# Patient Record
Sex: Male | Born: 1975
Health system: Southern US, Community
[De-identification: ages and names within clinical notes are randomized; demographics above are authoritative.]

## PROBLEM LIST (undated history)

## (undated) DIAGNOSIS — I10 Essential (primary) hypertension: Secondary | ICD-10-CM

## (undated) DIAGNOSIS — F209 Schizophrenia, unspecified: Secondary | ICD-10-CM

## (undated) DIAGNOSIS — J449 Chronic obstructive pulmonary disease, unspecified: Secondary | ICD-10-CM

## (undated) DIAGNOSIS — F1410 Cocaine abuse, uncomplicated: Secondary | ICD-10-CM

## (undated) DIAGNOSIS — I2699 Other pulmonary embolism without acute cor pulmonale: Secondary | ICD-10-CM

## (undated) DIAGNOSIS — G8929 Other chronic pain: Secondary | ICD-10-CM

## (undated) DIAGNOSIS — M549 Dorsalgia, unspecified: Secondary | ICD-10-CM

## (undated) DIAGNOSIS — Z8659 Personal history of other mental and behavioral disorders: Secondary | ICD-10-CM

## (undated) HISTORY — DX: Other chronic pain: G89.29

## (undated) HISTORY — DX: Cocaine abuse, uncomplicated: F14.10

## (undated) HISTORY — DX: Personal history of other mental and behavioral disorders: Z86.59

## (undated) HISTORY — DX: Dorsalgia, unspecified: M54.9

---

## 1999-02-19 ENCOUNTER — Inpatient Hospital Stay (HOSPITAL_COMMUNITY): Admission: EM | Admit: 1999-02-19 | Discharge: 1999-02-22 | Payer: Self-pay | Admitting: Emergency Medicine

## 2002-09-19 ENCOUNTER — Inpatient Hospital Stay (HOSPITAL_COMMUNITY): Admission: EM | Admit: 2002-09-19 | Discharge: 2002-10-01 | Payer: Self-pay | Admitting: Psychiatry

## 2003-04-03 ENCOUNTER — Encounter: Payer: Self-pay | Admitting: *Deleted

## 2003-04-03 ENCOUNTER — Inpatient Hospital Stay (HOSPITAL_COMMUNITY): Admission: EM | Admit: 2003-04-03 | Discharge: 2003-04-08 | Payer: Self-pay | Admitting: Psychiatry

## 2003-06-18 ENCOUNTER — Emergency Department (HOSPITAL_COMMUNITY): Admission: EM | Admit: 2003-06-18 | Discharge: 2003-06-18 | Payer: Self-pay | Admitting: Emergency Medicine

## 2003-06-30 ENCOUNTER — Inpatient Hospital Stay (HOSPITAL_COMMUNITY): Admission: EM | Admit: 2003-06-30 | Discharge: 2003-07-04 | Payer: Self-pay | Admitting: Psychiatry

## 2003-07-01 ENCOUNTER — Encounter: Payer: Self-pay | Admitting: Psychiatry

## 2003-07-05 ENCOUNTER — Emergency Department (HOSPITAL_COMMUNITY): Admission: EM | Admit: 2003-07-05 | Discharge: 2003-07-05 | Payer: Self-pay | Admitting: Emergency Medicine

## 2003-12-02 ENCOUNTER — Inpatient Hospital Stay (HOSPITAL_COMMUNITY): Admission: EM | Admit: 2003-12-02 | Discharge: 2003-12-05 | Payer: Self-pay | Admitting: Psychiatry

## 2004-01-14 ENCOUNTER — Emergency Department (HOSPITAL_COMMUNITY): Admission: EM | Admit: 2004-01-14 | Discharge: 2004-01-15 | Payer: Self-pay | Admitting: Emergency Medicine

## 2004-01-14 ENCOUNTER — Emergency Department (HOSPITAL_COMMUNITY): Admission: EM | Admit: 2004-01-14 | Discharge: 2004-01-14 | Payer: Self-pay | Admitting: Emergency Medicine

## 2004-10-04 ENCOUNTER — Emergency Department (HOSPITAL_COMMUNITY): Admission: EM | Admit: 2004-10-04 | Discharge: 2004-10-04 | Payer: Self-pay | Admitting: Emergency Medicine

## 2005-04-18 ENCOUNTER — Ambulatory Visit: Payer: Self-pay | Admitting: Psychiatry

## 2005-04-18 ENCOUNTER — Inpatient Hospital Stay (HOSPITAL_COMMUNITY): Admission: AD | Admit: 2005-04-18 | Discharge: 2005-04-23 | Payer: Self-pay | Admitting: Psychiatry

## 2005-07-10 ENCOUNTER — Inpatient Hospital Stay (HOSPITAL_COMMUNITY): Admission: EM | Admit: 2005-07-10 | Discharge: 2005-07-14 | Payer: Self-pay | Admitting: Psychiatry

## 2005-07-10 ENCOUNTER — Ambulatory Visit: Payer: Self-pay | Admitting: Psychiatry

## 2006-01-07 ENCOUNTER — Emergency Department (HOSPITAL_COMMUNITY): Admission: EM | Admit: 2006-01-07 | Discharge: 2006-01-07 | Payer: Self-pay | Admitting: Family Medicine

## 2006-03-01 ENCOUNTER — Emergency Department (HOSPITAL_COMMUNITY): Admission: EM | Admit: 2006-03-01 | Discharge: 2006-03-01 | Payer: Self-pay | Admitting: Family Medicine

## 2006-03-04 ENCOUNTER — Emergency Department (HOSPITAL_COMMUNITY): Admission: EM | Admit: 2006-03-04 | Discharge: 2006-03-04 | Payer: Self-pay | Admitting: Emergency Medicine

## 2006-03-07 ENCOUNTER — Emergency Department (HOSPITAL_COMMUNITY): Admission: EM | Admit: 2006-03-07 | Discharge: 2006-03-07 | Payer: Self-pay | Admitting: Emergency Medicine

## 2006-06-07 ENCOUNTER — Ambulatory Visit: Payer: Self-pay | Admitting: Psychiatry

## 2006-06-07 ENCOUNTER — Inpatient Hospital Stay (HOSPITAL_COMMUNITY): Admission: AD | Admit: 2006-06-07 | Discharge: 2006-06-13 | Payer: Self-pay | Admitting: Psychiatry

## 2006-07-16 ENCOUNTER — Inpatient Hospital Stay (HOSPITAL_COMMUNITY): Admission: EM | Admit: 2006-07-16 | Discharge: 2006-07-25 | Payer: Self-pay | Admitting: *Deleted

## 2006-07-16 ENCOUNTER — Ambulatory Visit: Payer: Self-pay | Admitting: Psychiatry

## 2006-10-30 ENCOUNTER — Emergency Department (HOSPITAL_COMMUNITY): Admission: EM | Admit: 2006-10-30 | Discharge: 2006-10-31 | Payer: Self-pay | Admitting: Emergency Medicine

## 2007-09-16 ENCOUNTER — Encounter: Payer: Self-pay | Admitting: Emergency Medicine

## 2007-09-16 ENCOUNTER — Inpatient Hospital Stay (HOSPITAL_COMMUNITY): Admission: AD | Admit: 2007-09-16 | Discharge: 2007-09-25 | Payer: Self-pay | Admitting: Psychiatry

## 2007-09-18 ENCOUNTER — Ambulatory Visit: Payer: Self-pay | Admitting: Psychiatry

## 2007-10-03 ENCOUNTER — Encounter: Payer: Self-pay | Admitting: Emergency Medicine

## 2007-10-03 ENCOUNTER — Inpatient Hospital Stay (HOSPITAL_COMMUNITY): Admission: AD | Admit: 2007-10-03 | Discharge: 2007-10-10 | Payer: Self-pay | Admitting: Psychiatry

## 2007-10-11 ENCOUNTER — Emergency Department (HOSPITAL_COMMUNITY): Admission: EM | Admit: 2007-10-11 | Discharge: 2007-10-11 | Payer: Self-pay | Admitting: Emergency Medicine

## 2009-03-15 ENCOUNTER — Emergency Department (HOSPITAL_COMMUNITY): Admission: EM | Admit: 2009-03-15 | Discharge: 2009-03-15 | Payer: Self-pay | Admitting: Emergency Medicine

## 2009-04-09 ENCOUNTER — Emergency Department (HOSPITAL_COMMUNITY): Admission: EM | Admit: 2009-04-09 | Discharge: 2009-04-09 | Payer: Self-pay | Admitting: Emergency Medicine

## 2009-04-11 ENCOUNTER — Emergency Department (HOSPITAL_COMMUNITY): Admission: EM | Admit: 2009-04-11 | Discharge: 2009-04-12 | Payer: Self-pay | Admitting: Emergency Medicine

## 2009-04-16 ENCOUNTER — Inpatient Hospital Stay (HOSPITAL_COMMUNITY): Admission: EM | Admit: 2009-04-16 | Discharge: 2009-04-17 | Payer: Self-pay | Admitting: Emergency Medicine

## 2009-05-02 ENCOUNTER — Emergency Department (HOSPITAL_COMMUNITY): Admission: EM | Admit: 2009-05-02 | Discharge: 2009-05-05 | Payer: Self-pay | Admitting: Emergency Medicine

## 2009-05-04 ENCOUNTER — Ambulatory Visit: Payer: Self-pay | Admitting: *Deleted

## 2009-08-10 ENCOUNTER — Emergency Department (HOSPITAL_COMMUNITY): Admission: EM | Admit: 2009-08-10 | Discharge: 2009-08-10 | Payer: Self-pay | Admitting: Emergency Medicine

## 2009-10-22 ENCOUNTER — Ambulatory Visit: Payer: Self-pay | Admitting: Psychiatry

## 2009-10-22 ENCOUNTER — Other Ambulatory Visit: Payer: Self-pay

## 2009-10-22 ENCOUNTER — Other Ambulatory Visit: Payer: Self-pay | Admitting: Emergency Medicine

## 2009-10-22 ENCOUNTER — Inpatient Hospital Stay (HOSPITAL_COMMUNITY): Admission: AD | Admit: 2009-10-22 | Discharge: 2009-10-27 | Payer: Self-pay | Admitting: Psychiatry

## 2009-12-30 ENCOUNTER — Emergency Department (HOSPITAL_COMMUNITY): Admission: EM | Admit: 2009-12-30 | Discharge: 2009-12-30 | Payer: Self-pay | Admitting: Emergency Medicine

## 2010-01-11 ENCOUNTER — Other Ambulatory Visit: Payer: Self-pay | Admitting: Emergency Medicine

## 2010-01-11 ENCOUNTER — Other Ambulatory Visit: Payer: Self-pay

## 2010-01-12 ENCOUNTER — Ambulatory Visit: Payer: Self-pay | Admitting: Psychiatry

## 2010-01-13 ENCOUNTER — Inpatient Hospital Stay (HOSPITAL_COMMUNITY): Admission: AD | Admit: 2010-01-13 | Discharge: 2010-01-15 | Payer: Self-pay | Admitting: Psychiatry

## 2010-01-30 ENCOUNTER — Emergency Department (HOSPITAL_COMMUNITY): Admission: EM | Admit: 2010-01-30 | Discharge: 2010-01-31 | Payer: Self-pay | Admitting: Emergency Medicine

## 2010-02-15 ENCOUNTER — Emergency Department (HOSPITAL_COMMUNITY): Admission: EM | Admit: 2010-02-15 | Discharge: 2010-02-15 | Payer: Self-pay | Admitting: Emergency Medicine

## 2010-03-15 ENCOUNTER — Emergency Department (HOSPITAL_COMMUNITY): Admission: EM | Admit: 2010-03-15 | Discharge: 2010-03-17 | Payer: Self-pay | Admitting: Emergency Medicine

## 2010-03-17 ENCOUNTER — Ambulatory Visit: Payer: Self-pay | Admitting: Psychiatry

## 2010-06-12 ENCOUNTER — Other Ambulatory Visit: Payer: Self-pay | Admitting: Emergency Medicine

## 2010-06-12 ENCOUNTER — Inpatient Hospital Stay (HOSPITAL_COMMUNITY): Admission: EM | Admit: 2010-06-12 | Discharge: 2010-06-15 | Payer: Self-pay | Admitting: Psychiatry

## 2010-06-12 ENCOUNTER — Ambulatory Visit: Payer: Self-pay | Admitting: Psychiatry

## 2010-12-10 LAB — DIFFERENTIAL
Eosinophils Relative: 6 % — ABNORMAL HIGH (ref 0–5)
Lymphocytes Relative: 27 % (ref 12–46)
Lymphs Abs: 2.7 10*3/uL (ref 0.7–4.0)
Monocytes Absolute: 0.5 10*3/uL (ref 0.1–1.0)

## 2010-12-10 LAB — CBC
HCT: 41.9 % (ref 39.0–52.0)
MCV: 90.7 fL (ref 78.0–100.0)
Platelets: 203 10*3/uL (ref 150–400)
RBC: 4.62 MIL/uL (ref 4.22–5.81)
WBC: 9.8 10*3/uL (ref 4.0–10.5)

## 2010-12-10 LAB — RAPID URINE DRUG SCREEN, HOSP PERFORMED
Amphetamines: NOT DETECTED
Barbiturates: NOT DETECTED
Benzodiazepines: NOT DETECTED
Cocaine: POSITIVE — AB
Opiates: NOT DETECTED
Tetrahydrocannabinol: NOT DETECTED

## 2010-12-10 LAB — BASIC METABOLIC PANEL
Chloride: 102 mEq/L (ref 96–112)
GFR calc Af Amer: >60 mL/min (ref >60)
Potassium: 4.1 mEq/L (ref 3.5–5.1)

## 2010-12-10 LAB — ETHANOL: Alcohol, Ethyl (B): <5 mg/dL (ref 0–10)

## 2010-12-13 LAB — RAPID URINE DRUG SCREEN, HOSP PERFORMED
Amphetamines: NOT DETECTED
Barbiturates: NOT DETECTED
Barbiturates: NOT DETECTED
Benzodiazepines: NOT DETECTED
Cocaine: POSITIVE — AB
Opiates: NOT DETECTED
Tetrahydrocannabinol: NOT DETECTED

## 2010-12-13 LAB — URINALYSIS, ROUTINE W REFLEX MICROSCOPIC
Glucose, UA: NEGATIVE mg/dL
Hgb urine dipstick: NEGATIVE
Ketones, ur: NEGATIVE mg/dL
pH: 7.5 (ref 5.0–8.0)

## 2010-12-13 LAB — DIFFERENTIAL
Basophils Absolute: 0 10*3/uL (ref 0.0–0.1)
Basophils Relative: 1 % (ref 0–1)
Eosinophils Absolute: 0.3 10*3/uL (ref 0.0–0.7)
Eosinophils Absolute: 0.4 10*3/uL (ref 0.0–0.7)
Eosinophils Relative: 4 % (ref 0–5)
Lymphs Abs: 2.7 10*3/uL (ref 0.7–4.0)
Monocytes Relative: 6 % (ref 3–12)
Neutrophils Relative %: 53 % (ref 43–77)

## 2010-12-13 LAB — HEMOGLOBIN A1C: Hgb A1c MFr Bld: 5.2 % (ref 4.6–6.1)

## 2010-12-13 LAB — CBC
HCT: 45.8 % (ref 39.0–52.0)
MCV: 93.4 fL (ref 78.0–100.0)
MCV: 95.2 fL (ref 78.0–100.0)
Platelets: 236 10*3/uL (ref 150–400)
RBC: 4.57 MIL/uL (ref 4.22–5.81)
RDW: 13.7 % (ref 11.5–15.5)
WBC: 7.8 10*3/uL (ref 4.0–10.5)

## 2010-12-13 LAB — POCT I-STAT, CHEM 8
Chloride: 102 mEq/L (ref 96–112)
Creatinine, Ser: 0.8 mg/dL (ref 0.4–1.5)
Creatinine, Ser: 1.1 mg/dL (ref 0.4–1.5)
Glucose, Bld: 86 mg/dL (ref 70–99)
HCT: 48 % (ref 39.0–52.0)
Hemoglobin: 16.3 g/dL (ref 13.0–17.0)
Hemoglobin: 15 g/dL (ref 13.0–17.0)
Potassium: 3.5 mEq/L (ref 3.5–5.1)
Sodium: 140 mEq/L (ref 135–145)
TCO2: 28 mmol/L (ref 0–100)

## 2010-12-13 LAB — HEPATIC FUNCTION PANEL
Albumin: 3.7 g/dL (ref 3.5–5.2)
Bilirubin, Direct: 0.1 mg/dL (ref 0.0–0.3)
Indirect Bilirubin: 0.8 mg/dL (ref 0.3–0.9)
Total Bilirubin: 0.9 mg/dL (ref 0.3–1.2)

## 2010-12-13 LAB — LIPID PANEL
Cholesterol: 175 mg/dL (ref 0–200)
HDL: 33 mg/dL — ABNORMAL LOW (ref >39)
Total CHOL/HDL Ratio: 5.3 RATIO

## 2010-12-13 LAB — HEPATITIS PANEL, ACUTE
Hep B C IgM: NEGATIVE
Hepatitis B Surface Ag: NEGATIVE

## 2010-12-15 LAB — DIFFERENTIAL
Basophils Absolute: 0 10*3/uL (ref 0.0–0.1)
Basophils Relative: 1 % (ref 0–1)
Eosinophils Absolute: 0.3 10*3/uL (ref 0.0–0.7)
Eosinophils Relative: 6 % — ABNORMAL HIGH (ref 0–5)
Eosinophils Relative: 5 % (ref 0–5)
Lymphs Abs: 2.7 10*3/uL (ref 0.7–4.0)
Monocytes Absolute: 0.4 10*3/uL (ref 0.1–1.0)
Monocytes Relative: 8 % (ref 3–12)

## 2010-12-15 LAB — BASIC METABOLIC PANEL
CO2: 30 mEq/L (ref 19–32)
Calcium: 9.2 mg/dL (ref 8.4–10.5)
Creatinine, Ser: 0.86 mg/dL (ref 0.4–1.5)
GFR calc non Af Amer: >60 mL/min (ref >60)
Glucose, Bld: 100 mg/dL — ABNORMAL HIGH (ref 70–99)
Glucose, Bld: 84 mg/dL (ref 70–99)
Potassium: 3.5 mEq/L (ref 3.5–5.1)
Sodium: 140 mEq/L (ref 135–145)
Sodium: 140 mEq/L (ref 135–145)

## 2010-12-15 LAB — RAPID URINE DRUG SCREEN, HOSP PERFORMED
Amphetamines: NOT DETECTED
Amphetamines: NOT DETECTED
Benzodiazepines: NOT DETECTED
Cocaine: POSITIVE — AB
Tetrahydrocannabinol: NOT DETECTED
Tetrahydrocannabinol: NOT DETECTED

## 2010-12-15 LAB — URINALYSIS, ROUTINE W REFLEX MICROSCOPIC
Bilirubin Urine: NEGATIVE
Ketones, ur: NEGATIVE mg/dL
Nitrite: NEGATIVE
Urobilinogen, UA: 0.2 mg/dL (ref 0.0–1.0)

## 2010-12-15 LAB — CBC
HCT: 42.2 % (ref 39.0–52.0)
Hemoglobin: 14.5 g/dL (ref 13.0–17.0)
Hemoglobin: 15.3 g/dL (ref 13.0–17.0)
MCHC: 34.4 g/dL (ref 30.0–36.0)
MCV: 94.2 fL (ref 78.0–100.0)
Platelets: 203 10*3/uL (ref 150–400)
RDW: 13.3 % (ref 11.5–15.5)
RDW: 13.6 % (ref 11.5–15.5)

## 2010-12-15 LAB — TRICYCLICS SCREEN, URINE: TCA Scrn: NOT DETECTED

## 2010-12-16 LAB — POCT I-STAT, CHEM 8
HCT: 43 % (ref 39.0–52.0)
Hemoglobin: 14.6 g/dL (ref 13.0–17.0)
Sodium: 140 mEq/L (ref 135–145)
TCO2: 28 mmol/L (ref 0–100)

## 2010-12-16 LAB — ETHANOL: Alcohol, Ethyl (B): <5 mg/dL (ref 0–10)

## 2011-01-02 LAB — CBC
HCT: 41.9 % (ref 39.0–52.0)
Hemoglobin: 14.3 g/dL (ref 13.0–17.0)
RBC: 4.48 MIL/uL (ref 4.22–5.81)
RDW: 13 % (ref 11.5–15.5)
WBC: 6 10*3/uL (ref 4.0–10.5)

## 2011-01-02 LAB — DIFFERENTIAL
Basophils Absolute: 0 10*3/uL (ref 0.0–0.1)
Basophils Relative: 1 % (ref 0–1)
Eosinophils Relative: 4 % (ref 0–5)
Lymphocytes Relative: 40 % (ref 12–46)
Lymphs Abs: 2.4 10*3/uL (ref 0.7–4.0)
Monocytes Absolute: 0.4 10*3/uL (ref 0.1–1.0)
Monocytes Relative: 7 % (ref 3–12)
Neutro Abs: 2.9 10*3/uL (ref 1.7–7.7)

## 2011-01-02 LAB — POCT I-STAT, CHEM 8
HCT: 43 % (ref 39.0–52.0)
Hemoglobin: 14.6 g/dL (ref 13.0–17.0)
Potassium: 3.8 mEq/L (ref 3.5–5.1)
Sodium: 142 mEq/L (ref 135–145)

## 2011-01-02 LAB — RAPID URINE DRUG SCREEN, HOSP PERFORMED
Barbiturates: NOT DETECTED
Opiates: NOT DETECTED
Tetrahydrocannabinol: NOT DETECTED

## 2011-01-03 LAB — CULTURE, ROUTINE-ABSCESS: Gram Stain: NONE SEEN

## 2011-01-03 LAB — DIFFERENTIAL
Basophils Absolute: 0 10*3/uL (ref 0.0–0.1)
Lymphocytes Relative: 16 % (ref 12–46)
Lymphs Abs: 1.5 10*3/uL (ref 0.7–4.0)
Monocytes Absolute: 0.3 10*3/uL (ref 0.1–1.0)
Monocytes Relative: 3 % (ref 3–12)
Neutro Abs: 6.8 10*3/uL (ref 1.7–7.7)

## 2011-01-03 LAB — URINE DRUGS OF ABUSE SCREEN W ALC, ROUTINE (REF LAB)
Barbiturate Quant, Ur: NEGATIVE
Benzodiazepines.: NEGATIVE
Methadone: NEGATIVE
Phencyclidine (PCP): NEGATIVE

## 2011-01-03 LAB — LIPID PANEL
Cholesterol: 125 mg/dL (ref 0–200)
HDL: 24 mg/dL — ABNORMAL LOW (ref >39)
LDL Cholesterol: 75 mg/dL (ref 0–99)
Total CHOL/HDL Ratio: 5.2 RATIO

## 2011-01-03 LAB — CBC
Hemoglobin: 14.2 g/dL (ref 13.0–17.0)
RBC: 4.43 MIL/uL (ref 4.22–5.81)
RDW: 12.6 % (ref 11.5–15.5)
WBC: 9.1 10*3/uL (ref 4.0–10.5)

## 2011-01-03 LAB — CULTURE, BLOOD (ROUTINE X 2)

## 2011-01-03 LAB — POCT I-STAT, CHEM 8
BUN: 8 mg/dL (ref 6–23)
Calcium, Ion: 1.08 mmol/L — ABNORMAL LOW (ref 1.12–1.32)
Chloride: 102 mEq/L (ref 96–112)
Sodium: 137 mEq/L (ref 135–145)

## 2011-01-03 LAB — APTT: aPTT: 28 seconds (ref 24–37)

## 2011-01-22 ENCOUNTER — Emergency Department (HOSPITAL_COMMUNITY)
Admission: EM | Admit: 2011-01-22 | Discharge: 2011-01-23 | Disposition: A | Discharge status: Home / Self Care | Payer: Medicare Other | Attending: Emergency Medicine | Admitting: Emergency Medicine

## 2011-01-22 DIAGNOSIS — F200 Paranoid schizophrenia: Secondary | ICD-10-CM

## 2011-01-22 DIAGNOSIS — X500XXA Overexertion from strenuous movement or load, initial encounter: Secondary | ICD-10-CM | POA: Insufficient documentation

## 2011-01-22 DIAGNOSIS — F209 Schizophrenia, unspecified: Secondary | ICD-10-CM | POA: Insufficient documentation

## 2011-01-22 DIAGNOSIS — S93409A Sprain of unspecified ligament of unspecified ankle, initial encounter: Secondary | ICD-10-CM | POA: Insufficient documentation

## 2011-01-22 DIAGNOSIS — IMO0002 Reserved for concepts with insufficient information to code with codable children: Secondary | ICD-10-CM | POA: Insufficient documentation

## 2011-01-22 DIAGNOSIS — Z046 Encounter for general psychiatric examination, requested by authority: Secondary | ICD-10-CM | POA: Insufficient documentation

## 2011-01-22 LAB — URINALYSIS, ROUTINE W REFLEX MICROSCOPIC
Bilirubin Urine: NEGATIVE
Hgb urine dipstick: NEGATIVE
Ketones, ur: NEGATIVE mg/dL
Nitrite: NEGATIVE
pH: 5.5 (ref 5.0–8.0)

## 2011-01-22 LAB — ETHANOL: Alcohol, Ethyl (B): <5 mg/dL (ref 0–10)

## 2011-01-22 LAB — DIFFERENTIAL
Eosinophils Absolute: 0.4 10*3/uL (ref 0.0–0.7)
Eosinophils Relative: 5 % (ref 0–5)
Lymphs Abs: 2.7 10*3/uL (ref 0.7–4.0)
Monocytes Relative: 4 % (ref 3–12)

## 2011-01-22 LAB — RAPID URINE DRUG SCREEN, HOSP PERFORMED
Amphetamines: NOT DETECTED
Benzodiazepines: NOT DETECTED
Cocaine: POSITIVE — AB
Opiates: NOT DETECTED
Tetrahydrocannabinol: NOT DETECTED

## 2011-01-22 LAB — CBC
HCT: 45.7 % (ref 39.0–52.0)
MCH: 31.6 pg (ref 26.0–34.0)
MCV: 90.3 fL (ref 78.0–100.0)
Platelets: 197 10*3/uL (ref 150–400)
RBC: 5.06 MIL/uL (ref 4.22–5.81)
RDW: 13.3 % (ref 11.5–15.5)

## 2011-01-22 LAB — BASIC METABOLIC PANEL
BUN: 6 mg/dL (ref 6–23)
Chloride: 106 mEq/L (ref 96–112)
Creatinine, Ser: 0.96 mg/dL (ref 0.4–1.5)
Glucose, Bld: 173 mg/dL — ABNORMAL HIGH (ref 70–99)

## 2011-01-23 DIAGNOSIS — F200 Paranoid schizophrenia: Secondary | ICD-10-CM
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
## 2011-02-09 NOTE — Discharge Summary (Signed)
[name], [name]                 ACCOUNT NO.:  [id number]   MEDICAL RECORD NO.:  [id number]          PATIENT TYPE:  INP   LOCATION:  5126                         [facility]ACILITY[facility]  [facility]   PHYSICIAN:  Charlestine Massed, MDDATE OF BIRTH:  June 14, 1976   DATE OF ADMISSION:  04/15/2009  DATE OF DISCHARGE:  04/17/2009                               DISCHARGE SUMMARY   PRIMARY CARE PHYSICIAN:  MD at [facility].   HAND SURGEON:  Dr. Izora Ribas.   REASON FOR ADMISSION:  Hand pain with infection.   DISCHARGE DIAGNOSES:  1. Right hand abscess, status post incision and drainage - growing      Staphylococcus aureus.  2. History of schizoaffective disorder, currently stable.   DISCHARGE MEDICATIONS:  1. Augmentin 875 mg p.o. b.i.d. times 7 days.  2. Bactrim DS one tablet p.o. b.i.d. for 7 days.   HOSPITAL COURSE:  #1 - Right upper extremity abscess:  [name]  is a 35 year old gentleman with a history of swelling and redness of the  dorsum of the right hand for four days.  The patient does not remember  any insect bite or any scratch or any injury.  The patient was seen in  the emergency room and the hand  surgeon was called.  The patient  underwent an I&D and the abscess fluid was sent for culture. T his  grew  Staphylococcus aureus.  The full identification is still pending.   The patient was started on vancomycin at the time of admission.  He did  not have any further pain or fever.   He was seen in transition today and was advised to okay the discharge  home, with home care, with four -times-a-day dressing and to follow up  with him in two weeks.  The patient's has antibiotic has been changed to  Bactrim and Augmentin, and the patient is being discharged.   #2 - 2.  Schizoaffective disorder:  Currently stable.  Follow up with  psychiatry at [facility].  Continue Zyprexa.   DISPOSITION:  1. Discharged to assisted living facility at [facility].  To follow up      for follow-up care as  required.  2. To follow up with Dr. Kristine Linea in two weeks.   DISCHARGE INSTRUCTIONS:  Dressing change four times daily as prescribed  with wet to dry packing four times daily, and if  needed, as per surgery.   A total of 40 minutes spent on this discharge.      Charlestine Massed, MD  Electronically Signed     UT/MEDQ  D:  04/17/2009  T:  04/17/2009  Job:  [id number]   cc:   Johnette Abraham, MD

## 2011-02-09 NOTE — H&P (Signed)
[name], [name]                 ACCOUNT NO.:  [id number]   MEDICAL RECORD NO.:  [id number]          PATIENT TYPE:  INP   LOCATION:  5126                         FACILITY:  [facility]   PHYSICIAN:  Johnette Abraham, MD    DATE OF BIRTH:  10-19-1975   DATE OF ADMISSION:  04/15/2009  DATE OF DISCHARGE:                              HISTORY & PHYSICAL   CHIEF COMPLAINT:  My hand hurts and is infected.   HISTORY OF PRESENT ILLNESS:  A 35 year old male with approximately 4 day  history of redness, swelling and drainage of the dorsum of the right  hand.  He does not recall any specific injury to the extremity or bitten  by an insect or other creature.  The patient apparently called 911 from  his assisted living and presented to the emergency room yesterday or  early this morning with signs and symptoms of infection and abscess of  the right hand.  The patient denies fever chest pain, swelling, and  drainage over the dorsum of the right hand.  His pain is sharp in  nature, it is rated at 4/5, it radiates to the dorsum of his hand,  keeping the hand still makes him feel better, obviously pressure on it,  makes it feel worse.  He also has an abrasion on the right elbow.   PAST MEDICAL HISTORY:  Significant for schizo-affective disorder.   ALLERGIES:  None.   MEDICATIONS:  Reviewed.  He is on a antipsychotic medications.   SOCIAL HISTORY:  He has a history of substance abuse.  He is a current  smoker.  He lives in assisted living.   FAMILY HISTORY:  Noncontributory.   REVIEW OF SYSTEMS:  HEENT, endocrine, neuro, psych, cardiovascular,  respiratory, heme, musculoskeletal, GI, and GU are all reviewed, these  are essentially normal with the exception of the psychiatric history and  the current infection.  The patient also states that he has a tender  spot on his lower extremity as well as his nose.   PHYSICAL EXAMINATION:  GENERAL:  He is afebrile.  He is sitting up in  bed.  He is alert, in  no acute distress.  CARDIOVASCULAR:  He has regular rate and rhythm.  RESPIRATORY:  He is in no acute distress.  ABDOMEN:  Soft and nontender.  EXTREMITIES:  Examination of the right upper extremity reveals normal  range of motion of the shoulder, elbow and wrist.  He has a obvious  cellulitis overlying the second and third metacarpal phalangeal joint  area.  There is scab overlying what appeared to be an open wound with  some purulence.  He has some tenderness more proximally overlying the  extensor tendon.  There is moderate swelling about the entire hand.  Distally, he is neurovascularly intact.  He is able to make a full fist,  however, with some discomfort.  Examination of the left upper extremity  has a normal range of motion about the shoulder, elbow, wrist and  fingers, this exam is essentially within normal limits.   LABORATORY DATA:  His white count is 9.  ASSESSMENT:  Abscess dorsum of the right hand early extensive  tenosynovitis.   PLAN:  I&D of the right hand.  He will be continued on vancomycin until  cultures have returned.      Johnette Abraham, MD  Electronically Signed     HCC/MEDQ  D:  04/16/2009  T:  04/17/2009  Job:  [id number]
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
## 2011-02-09 NOTE — H&P (Signed)
[name], [name]                 ACCOUNT NO.:  [id number]   MEDICAL RECORD NO.:  [id number]          PATIENT TYPE:  INP   LOCATION:  5120                         FACILITY:  [facility]   PHYSICIAN:  Lucile Crater, MD         DATE OF BIRTH:  [birth year]   DATE OF ADMISSION:  04/15/2009  DATE OF DISCHARGE:                              HISTORY & PHYSICAL   PRIMARY CARE PHYSICIAN:  None.   CHIEF COMPLAINT:  Right hand pain into 4 days.   HISTORY OF PRESENT ILLNESS:  [name] is a 35 year old Caucasian  gentleman with a history of schizoaffective disorder.  He currently  lives at an assisted living facility.  Earlier this evening he called 9-  1-1 because he has been having pain in his right hand for 4 days and the  assisted living facility refused to call 9-1-1.  The patient was then  brought to the emergency room.  On exam he has right hand swelling in  between the knuckles and there is fluctuance with a yellowish, purulent  discharge.  The patient denies having any fever or chills and he denies  to have been in a fight.  The patient also was noticed to have an  abrasion under his right elbow but he denies, he had no trauma or fight  absolutely.  The hand surgeon was contacted by the ER physician and he  agreed to do a consult on the patient, and he will be followed up  tomorrow.  The patient was given vancomycin 1 dose in the emergency room  and he continues to be afebrile.   REVIEW OF SYSTEMS:  A complete review of systems was done including  general, head, eyes, ears, nose, throat, cardiovascular, respiratory,  GI, GU, endocrine, musculoskeletal, skin, neurologic, psychiatric.  All  are within normal limits other than what is mentioned above.   PAST MEDICAL HISTORY:  1. Schizoaffective disorder.  2. Polysubstance abuse.  3. Cocaine abuse.  4. Generalized anxiety disorder.   ALLERGIES:  None.   MEDICATIONS AT HOME:  1. Zocor 5 mg p.o. q.h.s.  2. Zyprexa 20 mg p.o. q.h.s.  3.  Ibuprofen 800 mg p.o. t.i.d. p.r.n.   SOCIAL HISTORY:  He currently lives at an assisted living facility.  Tobacco abuse is ongoing.  He denies any recent cocaine abuse.  He  denies any IV drug abuse.   FAMILY HISTORY:  Noncontributory.   PHYSICAL EXAMINATION:  T-max 97.6, pulse rate 97, respirations 22, blood  pressure 129/76, O2 saturation 96%.  GENERAL APPEARANCE:  Not in any acute distress.  Alert, awake, oriented  x3.  Afebrile.  HEENT:  Normocephalic, atraumatic. The pupils are equal and react to  light and accommodation.  Extraocular movements are intact.  The mucous  membranes are moist.  NECK:  Supple.  No JVD, lymphadenopathy or carotid bruit.  CVS:  Regular rhythm.  Rate is normal.  No murmurs, rubs or gallops.  LUNGS:  Clear to auscultation bilaterally.  EXTREMITIES:  No clubbing, cyanosis, or edema.  The right hand is  swollen with fluctuance of  2 x 2 cm.  There is a 2 x 3-inch abrasion  under the right elbow.  NEUROLOGIC EXAM:  Grossly nonfocal.   LABORATORY DATA AND STUDIES:  CBC with differential:  WBC 9100,  hemoglobin 14.2, hematocrit 41, normal differential.  INR 1, PT 13.1,  APTT 28.  Sodium 137, potassium 3.6, chloride 102, BUN 8, creatinine  0.9, blood glucose 79.   ASSESSMENT AND PLAN:  1. Right hand cellulitis/abscess.  The patient will be started on IV      antibiotics.  He has a history of methicillin-resistant      Staphylococcus aureus in the past.  We will start him on      vancomycin.  Awaiting the hand surgeon consult.  He might require      incision and drainage.  2. Schizoaffective disorder.  We will continue his Zyprexa.  3. Hyperlipidemia.  We will continue his simvastatin and check a      fasting lipid panel.  4. Polysubstance abuse.  We will get a urine drug screen.  5. Deep vein thrombosis prophylaxis with subcutaneous unfractionated      heparin.  6. Fluids, electrolytes, and nutrition.  We will place a saline well.      We will replace  electrolytes as needed.  He will be started on a      regular diet.   The patient will be admitted to the floor for antibiotics and a possible  incision and drainage of the right hand abscess in the morning.      Lucile Crater, MD  Electronically Signed     TA/MEDQ  D:  04/16/2009  T:  04/16/2009  Job:  [id number]

## 2011-02-09 NOTE — Op Note (Signed)
[name], PASION                 ACCOUNT NO.:  [id number]   MEDICAL RECORD NO.:  [id number]          PATIENT TYPE:  INP   LOCATION:  5120                         FACILITY:  [facility]   PHYSICIAN:  Johnette Abraham, MD    DATE OF BIRTH:  September 12, 1976   DATE OF PROCEDURE:  04/16/2009  DATE OF DISCHARGE:                               OPERATIVE REPORT   PREOPERATIVE DIAGNOSIS:  Abscess and early tenosynovitis of the right  hand.   POSTOPERATIVE DIAGNOSIS:  Abscess and early tenosynovitis of the right  hand.   PROCEDURE:  Incision and drainage of the abscess of the right hand,  irrigation of the extensor tendon sheath, packing with iodoform gauze.   ANESTHESIA:  Local 1% lidocaine without epinephrine.   No acute complications.   SPECIMENS:  Cultures were sent.   INDICATIONS:  [name] is a 35 year old male with 4-day history of  swelling and drainage overlying the MCP joint of the second and third  digits.  It is believed that this is an abscess and early tenosynovitis.  Risks, benefits, and alternatives of surgery were discussed with the  patient, he agreed to proceed.  Consent was obtained.  A time-out was  performed.   PROCEDURE:  The hand was prepped and draped in normal sterile fashion.  The dorsum of the hand was anesthetized with 1% lidocaine giving good  anesthesia.  There was a scab overlying the actual abscess.  This was de-  roofed.  Full-thickness skin was debrided surrounding this.  Cultures  were obtained.  There was quite a bit of fat necrosis.  There was a  pocket that was a little bit more proximal.  This was undermined and  opened.  The abscess pocket was irrigated with irrigation solution and  actually some hydrogen peroxide.  Extensor tendon sheath was then gently  opened and irrigated as well.  Palpation along the extensor tendon  sheath both proximally and distally and around the wound did not reveal  any loculated pockets of purulence.  The wound was then  packed with 0.25-  inch iodoform gauze and covered with sterile dressings.      Johnette Abraham, MD  Electronically Signed     HCC/MEDQ  D:  04/16/2009  T:  04/16/2009  Job:  [id number]
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
## 2011-02-12 NOTE — Consult Note (Signed)
   [name], [name]                           ACCOUNT NO.:  [id number]   MEDICAL RECORD NO.:  [id number]                   PATIENT TYPE:  IPS   LOCATION:  0306                                 [facility]ILITY[facility]  [facility]   PHYSICIAN:  Karlene Einstein, M.D.             DATE OF BIRTH:  [birth year]   DATE OF CONSULTATION:  07/03/2003  DATE OF DISCHARGE:                                   CONSULTATION   CONSULTING PHYSICIAN:  Karlene Einstein, M.D.   REQUESTING PHYSICIAN:  Jeanice Lim, M.D.   REASON FOR CONSULTATION:  Hypertriglyceridemia.   HISTORY OF PRESENT ILLNESS:  A 35 year old white male admitted at [facility] with a diagnosis of schizophrenia and cocaine abuse found  to be having elevated triglyceride levels.  The patient denies any family  history of hypertriglyceridemia or hyperlipidemia.  He denies any problems.   REVIEW OF SYSTEMS:  CARDIOVASCULAR:  No chest pain, palpitations, orthopnea,  or PND.  RESPIRATORY:  No cough or shortness of breath.   PHYSICAL EXAMINATION:  VITAL SIGNS:  Blood pressure 122/69.  GENERAL:  No acute distress, cooperative.  LUNGS:  Clear to auscultation bilaterally.  No rales or rhonchi.  HEART:  Regular rate and rhythm, normal S1 and S2.  ABDOMEN:  Soft, nontender, nondistended.  No organomegaly.   LABORATORY DATA:  Total cholesterol 176, triglycerides 498, HDL 27.   ASSESSMENT AND PLAN:  Hypertriglyceridemia.  His total cholesterol is  normal.  He is relatively young but he is schizophrenic.  We will offer diet  counseling.  Doubt he will be adherent to diet.  He says he almost eats  pizza every day.  Will start Tricor 54 mg daily.  LFTs are within normal  limits.  He will need a follow-up of fasting lipid panel and LFTs in three  months.   Thank you for this consultation.  Will sign off today.                                               Karlene Einstein, M.D.    GD/MEDQ  D:  07/03/2003  T:  07/03/2003  Job:   [id number]

## 2011-02-12 NOTE — Discharge Summary (Signed)
[name], [name] NO.:  [id number]   MEDICAL RECORD NO.:  [id number]          PATIENT TYPE:  IPS   LOCATION:  0508                          [facility]ACILITY[facility]  [facility]   PHYSICIAN:  Geoffery Lyons, M.D.      DATE OF BIRTH:  03/04/1976   DATE OF ADMISSION:  07/16/2006  DATE OF DISCHARGE:  07/25/2006                               DISCHARGE SUMMARY   CHIEF COMPLAINT AND PRESENT ILLNESS:  This was one of several admissions  to [facility] for this 35 year old single white male.  Last admitted June 07, 2006 to June 13, 2006.  Being followed  up.  Has not been compliant with his Zyprexa.  Presented to [facility].  Reported that he was homeless.  Uncle left him.  No place to  stay.  As he was not suicidal, he was told to come back during normal  business hours.  He reported history of walking in the streets in an  attempt to prostitute himself to get money.  Ran into someone from  [employer] who brought him back to mental health.   PAST PSYCHIATRIC HISTORY:  Diagnosed schizophrenic with poor medication  compliance.  Follow-up at the [facility] with abuse of cocaine and  marijuana.   MEDICAL HISTORY:  Noncontributory.   ALCOHOL/DRUG HISTORY:  As already stated, relapsed on cocaine and  marijuana.   MEDICATIONS:  Zyprexa 20 mg, Symmetrel 100 mg twice a day but  noncompliant.   PHYSICAL EXAMINATION:  Performed and failed to show any acute findings.   LABORATORY DATA:  CBC with white blood cells 7.7, hemoglobin 15.3.  Blood chemistry with sodium 139, potassium 3.8, glucose 95.  Liver  enzymes with SGOT 14, SGPT 11, total bilirubin 0.8.   MENTAL STATUS EXAM:  Alert male.  Not as spontaneous.  Very guarded,  somewhat resistant.  Pretty limited insight.  Pretty concrete.  No  active delusions.  No acute suicidal or homicidal ideation.  No  hallucinations.  Cognition was preserved.   ADMISSION DIAGNOSES:  AXIS I:  Schizophrenia,  paranoid.  Cocaine and  marijuana abuse.  AXIS II:  No diagnosis.  AXIS III:  Multiple dental problems.  AXIS IV:  Moderate.  AXIS V:  GAF upon admission 35; highest GAF in the last year 60.   HOSPITAL COURSE:  He was admitted.  He was started in individual and  group psychotherapy.  He was placed back on Zyprexa.  He was given the  Symmetrel.  Initially very sedated, not too communicative.  By the  second day, more alert.  Endorsed he needed a place to go.  Would not  want to stay in [place].  Said that the payee did not keep up with  his part of the deal with another guy and left him and they owe money.  The payee fled from the area.  He is afraid of these people.  He  endorsed that he was noncompliant with medication.  He went back using  cocaine.  Very upset because the payee left him and he endorsed no other  support.  He wanted to go to [place].  He tried to get in touch with  payee unsuccessfully.  Meanwhile, we got the Zyprexa back in place.  We  worked on relapse prevention plan.  Continued to be focused.  He was  afraid to stay in [place].  He went through an interview for a group  home but he was turned down.  He got upset but he was able to deal with  it.  The voices were better.  He was sleeping through the night.  Endorsed that he was not tired anymore.  For the next several days, we  continued to stabilize.  By July 25, 2006, he was in full contact  with reality.  His mood was euthymic.  His affect was broad, interacting  appropriately.  He was going to be placed in a group home.  He was  planning to abstain from cocaine and follow up closer with mental  health.  As he was much improved, we went ahead and discharged to  outpatient follow-up.   DISCHARGE DIAGNOSES:  AXIS I:  Schizophrenia, paranoid.  Cocaine abuse.  AXIS II:  No diagnosis.  AXIS III:  No diagnosis.  AXIS IV:  Moderate.  AXIS V:  GAF upon discharge 50.   DISCHARGE MEDICATIONS:  1. Zyprexa  Zydis 20 mg at bedtime.  2. Orajel apply to painful gums four times a day.  3. Ibuprofen 400 mg three times a day.  4. Amantadine 100 mg twice a day.   FOLLOWUP:  Dr. Lolly Mustache in [place].      Geoffery Lyons, M.D.  Electronically Signed     IL/MEDQ  D:  08/22/2006  T:  08/22/2006  Job:  [id number]

## 2011-02-12 NOTE — H&P (Signed)
[name], [name]                           ACCOUNT NO.:  [id number]   MEDICAL RECORD NO.:  [id number]                   PATIENT TYPE:  IPS   LOCATION:  0405                                 FACILITY:  [facility]   PHYSICIAN:  Geoffery Lyons, M.D.                   DATE OF BIRTH:  Aug 31, 1976   DATE OF ADMISSION:  12/02/2003  DATE OF DISCHARGE:  12/05/2003                         PSYCHIATRIC ADMISSION ASSESSMENT   IDENTIFYING INFORMATION:  This is a 35 year old white male who is single.  This is a voluntary admission.   HISTORY OF PRESENT ILLNESS:  This patient presented himself at the hospital  with a guarded affect and appeared confused.  He was sent to the emergency  room for medical clearance and diagnosed at that time with an upper  respiratory infection.  His urine drug screen was positive for cocaine.  Today the patient appears perplexed and distracted.  He is listening to  voices and unable to attend for an adequate interview.  He is unable to give  history.  History is primarily taken from the record.  He has admitted to  feeling suicidal without any clear plan.   PAST PSYCHIATRIC HISTORY:  This is the patient's fourth admission to [facility] since December of 2003 with his last admission  being in October of 2004.  He has previously been seen at [facility] but his compliance is not clear.  He has a history of  cocaine abuse, psychosis not otherwise specified, possibly schizophrenia,  auditory hallucinations and suicidal ideation and has in the past had  thoughts of stabbing himself.  He was previously treated with Zyprexa 30 mg  at h.s. in the past.   SOCIAL HISTORY:  This is a single white male with no children, never  married, previously had lived with his parents and his current living  situation is not clear.  Legal problems are not clear.   FAMILY HISTORY:  Not available.   ALCOHOL AND DRUG HISTORY:  The patient has a  history of cocaine abuse.   PAST MEDICAL HISTORY:  The patient has no regular primary care Sherika Kubicki.  Medical problems include an upper respiratory infection and nasal congestion  diagnosed in the emergency room.   MEDICATIONS:  The patient says he is taking none at this time.   PHYSICAL FINDINGS:  This is a well-nourished, well-developed, generally  healthy appearing young man who is disheveled.  Clothes are disrupted and  his hygiene is poor.  Vital signs:  Temperature 98.6, pulse 117,  respirations 22, blood pressure 144/97.  He does permit me to listen to his  heart lungs.  HEART:  S1 and S2 with regular rate and rhythm, tachycardic.  LUNGS:  Clear to auscultation.  The patient is 5 feet 11.5 inches tall, weighs 194 pounds.  His full physical was done in the emergency room and  is noted in the record.  His motor exam appears grossly normal.  He is unable to attend for cranial  nerve exam.  Motor movements are smooth, without any signs of tremor, no  diaphoresis.  Gait is grossly normal.  There is motor and facial symmetry  present.   DIAGNOSTIC STUDIES:  CBC was remarkable for mild elevation of white count of  11,400, with mildly elevated absolute neutrophils of 8,100.  Electrolytes  revealed a potassium of 3.4.  Other electrolytes are normal.  BUN 6,  creatinine 1.0.  Urine drug screen was positive for benzodiazepines.  Alcohol level was negative.  TSH, hepatic function, lipid profile and  glycohemoglobin  are currently pending.   DRUG ALLERGIES:  GEODON which caused EPS during his last admission and  ZYPREXA which was discontinued because of elevated triglycerides.   MENTAL STATUS EXAM:  This is a fully alert male who appears distracted and  appears to be listening to something.  He is directable without too much  difficulty, attempts to be cooperative but is unable to attend to  conversation.  He has some psychomotor restlessness, no signs of aggression.  Speech is without  pressure.  His responses are fragmented.  He glances  around the room and will say 1 or 2 words afterwards, but appears not to  have heard the question.  Mood seems depressed and anxious.  Affect is  labile, at times looking concerned and worried, other times smiling and  appearing distracted.  After much questioning, he is able to state his name,  the correct date and where he is and the situation.  Thought process is  positive for auditory hallucinations with some mild thought agitation and  restlessness from time to time.  He appears to be significantly thought  blocking, will begin a sentence and then is readily disrailed, at other  times is not able to respond at all.  He is an unreliable historian due to  his psychosis.   ADMISSION DIAGNOSIS:   AXIS I:  1. Psychosis not otherwise specified.  2. Cocaine abuse.   AXIS II:  Deferred.   AXIS III:  Upper respiratory infection.   AXIS IV:  Deferred.   AXIS V:  Current 21, past year 55-60.   INITIAL PLAN OF CARE:  Plan is to voluntarily admit the patient with q.15  minute checks in place.  We have placed him on our intensive care unit for  close observation and supervision.  We are going to give him Seroquel 300 mg  here at the time of admission and Ativan 1 mg, then we will start him on  Seroquel 300 mg p.o. q.h.s. and Ativan 1 mg q.4 hours p.r.n. for agitation,  and we will start him on Septra DS 1 tab p.o. b.i.d. which was prescribed in  the emergency room, and when he is clearer we will attempt to get some  additional social history and background on him.   ESTIMATED LENGTH OF STAY:  5-6 days.     Margaret A. Scott, N.P.                   Geoffery Lyons, M.D.    MAS/MEDQ  D:  12/06/2003  T:  12/06/2003  Job:  [id number]

## 2011-02-12 NOTE — Discharge Summary (Signed)
NAMEWAHID, HOLLEY NO.:  [id number]   MEDICAL RECORD NO.:  [id number]          PATIENT TYPE:  IPS   LOCATION:  0400                          FACILITY:  [facility]   PHYSICIAN:  Geoffery Lyons, M.D.      DATE OF BIRTH:  04/21/1976   DATE OF ADMISSION:  07/10/2005  DATE OF DISCHARGE:  07/14/2005                                 DISCHARGE SUMMARY   CHIEF COMPLAINT AND PRESENTING ILLNESS:  This was one of several admissions  to [facility]  for this 35 year old single white male.  He  apparently called the police before this admission, told them that he did  not feel safe.  He was reporting auditory hallucinations, suicidal ideas and  plan.  His plan was to run into traffic or put a rope around his neck.  Acknowledges having been noncompliant with his medications.  Has been using  marijuana regularly.   PAST PSYCHIATRIC HISTORY:  Last time admitted July 23-28.  Acknowledges  admissions to other facilities.   ALCOHOL AND DRUG HISTORY:  Began using marijuana as a teenager.  Denies any  alcohol or any other substance use.   PAST MEDICAL HISTORY:  Noncontributory.   MEDICATIONS:  Zyprexa 20 mg at night, Ambien 10, been noncompliant.   PHYSICAL EXAMINATION:  Performed, failed to show any acute findings.   LABORATORY WORKUP:  Not available.   MENTAL STATUS EXAM:  Reveals a male who was disheveled, appropriate eye  contact.  Speech was not pressured.  He was alert.  Mood was anxious,  worrying, affect anxiety.  Thought processes were relevant.  At one time he  displayed some flight of ideas.  Very poor insight.  Endorsed hearing  children's voices on and off.  Felt safe in the hospital so he did not feel  suicidal any more.   ADMISSION DIAGNOSES:  AXIS I:  Schizophrenia, paranoid type.  AXIS II:  No diagnosis.  AXIS III:  No diagnosis.  AXIS IV:  Moderate.  AXIS V:  Upon admission 25-30, highest global assessment of function in last  year 60.   COURSE  IN HOSPITAL:  He was admitted and started in individual and group  psychotherapy.  He was given Ambien for sleep.  He was placed back on  Zyprexa.  Zyprexa was eventually increased up to 20 mg at night.  He  endorsed that he was hanging out with the wrong people.  He was not taking  his medication.  He claimed that some people called the police on him and  they found some paraphernalia.  He minimized his drug use.  He had told the  assessment office that he was using marijuana.  Initially very irritable and  uncooperative, yet able to settle down, was willing to take the medication.  Wanted to find a different place to live.  There was still his payee who was  the person that he could count on.  By October 17, he endorsed he was  getting better.  Not sure he was going to be able to go back to where he  was.  Willing to look into a group home.  Understood he needed the  structure.  Zyprexa was helping him with sleep.  He was tolerating the  medication quite well.  On October 18 he was much better, no suicidal or  homicidal ideas, no hallucinations, no delusions.  He was by a group home  staff.  He liked the program that he was presented with, and he was willing  to go to the group home.  This would provide the structure that he needed to  probably be able to do better and stay out of the hospital.   DISCHARGE DIAGNOSES:  AXIS I:  Schizophrenia, paranoid type.  AXIS II:  No diagnosis.  AXIS III:  No diagnosis.  AXIS IV:  Moderate.  AXIS V:  Upon discharge 50.   DISCHARGE MEDICATIONS:  Zyprexa 20 at night.   DISPOSITION:  Follow up with Dr. Lang Snow at [facility].      Geoffery Lyons, M.D.  Electronically Signed     IL/MEDQ  D:  08/09/2005  T:  08/10/2005  Job:  [id number]

## 2011-02-12 NOTE — H&P (Signed)
[name], [name]                           ACCOUNT NO.:  [id number]   MEDICAL RECORD NO.:  [id number]                   PATIENT TYPE:  IPS   LOCATION:  0401                                 FACILITY:  [facility]   PHYSICIAN:  Hipolito Bayley, M.D.               DATE OF BIRTH:  [birth year]   DATE OF ADMISSION:  09/19/2002  DATE OF DISCHARGE:                         PSYCHIATRIC ADMISSION ASSESSMENT   IDENTIFYING INFORMATION:  A 35 year old single white male, involuntarily  committed on September 19, 2002.   HISTORY OF PRESENT ILLNESS:  The patient presents on papers.  The patient  was found in the parking lot trying to jump in front of cars, appeared to be  responding to positive auditory hallucinations, delusional, stating that  the world is on fire.  Apparently the patient had punched a wall when the  patient has gotten angry and presently has a splint in place.  He states he  does not need any kind of medicines at this time.   PAST PSYCHIATRIC HISTORY:  History of paranoid schizophrenia, attends  [facility], has been an outpatient for 5 years although  presently there seems to be no note of any kind of medications.  History of  suicide attempt 4 years ago.   SOCIAL HISTORY:  He is a 35 year old single white who lives alone.  He is on  disability.  Other social history information is unable to be obtained.   FAMILY HISTORY:  Unclear.   ALCOHOL DRUG HISTORY:  No apparent alcohol or drug use.   PAST MEDICAL HISTORY:  Primary care [name] is unknown.  Medical problems  are none.   MEDICATIONS:  None.   DRUG ALLERGIES:  No known allergies.   PHYSICAL EXAMINATION:  Performed at [facility] with no significant  findings.  The patient does have a splint to his right hand where he states  he hit a wall.  His skin color is good.  The patient's vital signs are  stable, 97.5, 97 heart rate, 20 respirations, blood pressure is 141/75.  He  is 192 pounds, he is six feet  tall.  His urine drug screen was negative.  CBC was within normal limits.  Alcohol level was less than 0.01.   MENTAL STATUS EXAM:  He is an alert, disheveled young male.  His eyes are  darting around.  He is walking with his legs apart.  He is very guarded.  Speech is clear, he is polite.  He is laughing inappropriately, appears to  be responding to internal stimuli.  Cognitive function intact.  Memory is  fair, judgment and insight are poor.  The patient appears confused.   ADMISSION DIAGNOSES:   AXIS I:  Paranoid schizophrenia.   AXIS II:  Deferred.   AXIS III:  Questionable fracture to right hand.   AXIS IV:  Other psychosocial problems.   AXIS V:  Current is 25, this past year  60.   PLAN:  Involuntary commitment for psychosis.  Contract for safety, check  every 15 minutes.  The patient will be placed on the 400 hall.  Will  initiate Risperdal and Ativan to target psychotic symptoms and decrease  anxiety.  Will stabilize mood and thinking so the patient can be safe and  functional.  We will have emergency sedation available for aggressive or  agitative behavior.  Contact family for background information.  The patient  to be medication compliant, to follow up with [facility].   TENTATIVE LENGTH OF CARE:  4-6 days or more depending on patient's response  to medication.      Landry Corporal, N.P.                       Hipolito Bayley, M.D.    JO/MEDQ  D:  09/21/2002  T:  09/21/2002  Job:  [id number]

## 2011-02-12 NOTE — H&P (Signed)
[name], [name] NO.:  [id number]   MEDICAL RECORD NO.:  [id number]          PATIENT TYPE:  IPS   LOCATION:  0402                          FACILITY:  [facility]   PHYSICIAN:  Jasmine Pang, M.D. DATE OF BIRTH:  [birth year]   DATE OF ADMISSION:  07/16/2006  DATE OF DISCHARGE:                         PSYCHIATRIC ADMISSION ASSESSMENT   IDENTIFYING INFORMATION:  This is a 35 year old single white male.  He was  last with [place], September 11 to September 17, since that time he has made no  appointments for followup at [facility] and he has been  noncompliant with his Zyprexa 20 mg h.s.  Yesterday he presented at [facility] and he reported that he feels he is homeless, that his  uncle had left him, and that he had no place to stay.  Due to the fact that  he had not been active in followup and did not appear to be psychotic, they  told him that he had to come back during normal business hours.  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   PAST PSYCHIATRIC HISTORY:  He is a well known schizophrenic who is  frequently noncompliant with medication as well as abuse of cocaine and  marijuana.   SOCIAL HISTORY:  Please see prior records.   FAMILY HISTORY:  Please see prior records.   ALCOHOL AND DRUG HISTORY:  He does use cocaine and marijuana.  He smokes 2  packs of cigarettes per day.  I do not have his current UDS.   PRIMARY CARE Kendrah Lovern:  [facility].   PRIMARY PSYCHIATRIC Odus Clasby:  Dr. Ezzard Flax at [facility].   MEDICAL PROBLEMS:  He has active dental caries.  He has no other medical  problems that are known.   MEDICATIONS:  When last with [place], he was discharged on:  1. Zyprexa 20 mg p.o. at h.s.  2. Symmetrel 100 mg b.i.d. daily.   PHYSICAL EXAMINATION:  VITAL  SIGNS:  Show he is 71 inches tall, weighs 185,  temperature is 98.3, blood pressure was 130/80 to 112/76, pulse ranged from  88 to 94, and respirations are 16.  MENTAL STATUS:  He is sedated at the moment.  He has obviously had a shower.  He is dressed in a hospital gown.  He is answering in just one word answers  at the moment and basically wants an apartment.  His thought processes are  clear, rational, and goal-oriented.  He would like housing.  Judgment and  insight are poor.  Concentration and memory are poor.  Intelligence is at  least average.  He denies being suicidal or homicidal.  He denies auditory  visual hallucinations.   DIAGNOSES:   AXIS I:  Schizophrenia, paranoid, noncompliant with medication.  Possible substance abuse of cocaine and marijuana.   AXIS II:  Borderline traits.   AXIS III:  He needs dental care.  AVIS IV:  He reports he is homeless.   AXIS V:  Thirty-five.   PLAN:  Help the patient become re-compliant with medications and to help him  get reconnected at [facility], toward that end [facility] suggested that he call the [facility] at [phone]  to help Judith get housing, etcetera.  They should also call the [facility] up on [facility] for an appointment as he desperately  needs dental care.      Mickie Leonarda Salon, P.A.-C.      Jasmine Pang, M.D.  Electronically Signed    MD/MEDQ  D:  07/16/2006  T:  07/16/2006  Job:  [id number]

## 2011-02-12 NOTE — H&P (Signed)
[name], [name]                           ACCOUNT NO.:  [id number]   MEDICAL RECORD NO.:  [id number]                   PATIENT TYPE:  IPS   LOCATION:  0402                                 FACILITY:  [facility]   PHYSICIAN:  Geoffery Lyons, M.D.                   DATE OF BIRTH:  June 19, 1976   DATE OF ADMISSION:  04/03/2003  DATE OF DISCHARGE:                         PSYCHIATRIC ADMISSION ASSESSMENT   IDENTIFYING INFORMATION:  A 35 year old single white male, voluntarily  admitted on April 03, 2003.   HISTORY OF PRESENT ILLNESS:  The patient presents on a voluntary admission.  Reports that he has been thinking about hurting himself.  He has no plan and  no intention to harm himself.  He has been off his medicines for about 3-4  months.  His sleep has been decreased, his appetite has been fair.  He has  been smoking crack cocaine.  It is noted in the chart that had a wall  response to apparently hearing some voices.   PAST PSYCHIATRIC HISTORY:  Second hospitalization to [facility], sees Dr. Hortencia Pilar as an outpatient.  No suicide attempt in the past.  He was at [facility] 2 years ago and [facility] 2  months ago.   SOCIAL HISTORY:  He is a 35 year old single white male with no children.  He  lives with his parents.  He is on disability.   FAMILY HISTORY:  Unclear.   ALCOHOL DRUG HISTORY:  The patient smokes a pack per day, denies any alcohol  use, has been smoking crack cocaine.   PAST MEDICAL HISTORY:  Primary care Sameria Morss is unknown.  Medical problems  are none.   MEDICATIONS:  The patient was on Zyprexa 30 mg in the past.   DRUG ALLERGIES:  No known allergies.   PHYSICAL EXAMINATION:  Done at [facility].  His urine drug screen is  positive for cocaine.  CMET within normal limits.  Alcohol level less than  5.   MENTAL STATUS EXAM:  The patient is in the bed, his eyes closed.  He is very  sedated, offers little information.  Speech is clear.   Mood:  The patient  states he is tired.  He appears sedated from medication.  Thought processes  are coherent, he does not appear to be responding to internal stimuli.  The  patient appears to know where he is and is able to answer basic demographic  questions.  His judgment and insight are poor.   ADMISSION DIAGNOSES:   AXIS I:  1. Schizophrenia.  2. Cocaine abuse.   AXIS II:  Deferred.   AXIS III:  None.   AXIS IV:  Other psychosocial problems with chronic illness and drug use.   AXIS V:  Current is 25-30, past year 104.   PLAN:  Voluntary admission for psychosis, drug use.  Contract for safety,  check  every 15 minutes.  The patient will be placed on the 400 hall for  close monitoring.  Stabilize mood and thinking so the patient can be safe  and functional.  We will resume his medications.  Will consider a family  session with parents prior to discharge.  The patient is to be medication  compliant, to remain drug free and to follow up with mental health.   TENTATIVE LENGTH OF CARE:  4-6 days.      Landry Corporal, N.P.                       Geoffery Lyons, M.D.    JO/MEDQ  D:  04/05/2003  T:  04/05/2003  Job:  [id number]

## 2011-02-12 NOTE — Discharge Summary (Signed)
NAMECAMIL, WILHELMSEN NO.:  [id number]   MEDICAL RECORD NO.:  [id number]          PATIENT TYPE:  IPS   LOCATION:  0404                          FACILITY:  BH   PHYSICIAN:  Jeanice Lim, M.D. DATE OF BIRTH:  [birth year]   DATE OF ADMISSION:  04/18/2005  DATE OF DISCHARGE:  04/23/2005                                 DISCHARGE SUMMARY   IDENTIFYING DATA:  This is a 35 year old single Caucasian male voluntarily  admitted.  Became afraid.  Presented to the emergency room.  Felt like  walking in traffic.  Had been on the street for several days.  Has not been  taking medication for about a week.  Fearful, confused.  Denied any recent  drug or alcohol use.  Followed up at [facility].  Several admissions.  Fifth to [facility], last in March of  2005.   MEDICATIONS:  Zyprexa 30 mg total daily.   ALLERGIES:  No known drug allergies.   PHYSICAL EXAMINATION:  Physical and neurologic exam essentially within  normal limits.   LABORATORY DATA:  Routine admission labs within normal limits.   MENTAL STATUS EXAM:  Alert, bright affect.  Cooperative.  Redirectible.  Guarded.  Speech productive, decreased productivity, tone and tempo.  Mood  euthymic.  Thought processes mostly goal directed.  Some irrelevancy, a  volition, no overt suicidal or homicidal ideation.  Now cognitively intact.  Judgment and insight were impaired.   ADMISSION DIAGNOSES:  AXIS I:  Schizophrenia, paranoid-type.  Cocaine abuse  by history.  AXIS II:  Deferred.  AXIS III:  Dental caries and dental pain as per patient.  AXIS IV:  Severe (stressors including homelessness and multiple psychosocial  stressors and financial stressors and limited system).  AXIS V:  25/55.   HOSPITAL COURSE:  The patient was admitted and ordered routine p.r.n.  medications and underwent further monitoring.  The patient was monitored for  safety.  Resumed on previous  psychotropics.  Placed on amoxicillin for tooth  pain and possible infection, to clear up so that dental work could be done  after discharge.  The patient was monitored for safety.  Casemanagement  involved her psychosocial assessment of needs and aftercare planning and to  evaluate discharge barriers.  The patient reported a positive response.  Reported feeling less depressed, less distracted.  Reporting decreased  voices and resolution of suicidal or homicidal ideation.   CONDITION ON DISCHARGE:  The patient was eventually discharged in improved  condition with no dangerous ideation, no withdrawal symptoms.  Reporting a  positive response with medications.  Given medication education.   DISCHARGE MEDICATIONS:  1.  Zyprexa 20 mg at 8 p.m.  2.  Ambien 10 mg q.h.s. p.r.n. insomnia.  3.  Zyprexa 2.5 mg b.i.d.   FOLLOW UP:  The patient was discharged to follow up with local mental health  resources with [facility] and also substance abuse  treatment resources available as well as aftercare planning and placement  disposition.  The patient's condition was improved.   DISCHARGE DIAGNOSES:  AXIS I:  Schizophrenia, paranoid-type.  Cocaine abuse  by history.  AXIS II:  Deferred.  AXIS III:  Dental caries and dental pain as per patient.  AXIS IV:  Severe (stressors including homelessness and multiple psychosocial  stressors and financial stressors and limited system).  AXIS V:  GAF on discharge 55.      Jeanice Lim, M.D.  Electronically Signed     JEM/MEDQ  D:  05/26/2005  T:  05/27/2005  Job:  [id number]

## 2011-02-12 NOTE — Discharge Summary (Signed)
[name], [name]                           ACCOUNT NO.:  [id number]   MEDICAL RECORD NO.:  [id number]                   PATIENT TYPE:  IPS   LOCATION:  0405                                 FACILITY:  BH   PHYSICIAN:  Geoffery Lyons, M.D.                   DATE OF BIRTH:  14-Mar-1976   DATE OF ADMISSION:  09/19/2002  DATE OF DISCHARGE:  10/01/2002                                 DISCHARGE SUMMARY   CHIEF COMPLAINT AND PRESENT ILLNESS:  This was the first admission to [facility] for this 35 year old single white male  involuntarily committed.  He was found in a parking lot trying to jump in  front of cars.  He appeared to be responding to positive auditory  hallucinations, delusional thinking that the world was on fire.  He had  punched a wall when the patient had gotten angry and he had a splint in  place.  He claimed he did not need any medications.   PAST PSYCHIATRIC HISTORY:  He had a history of paranoid schizophrenia.  [facility]; he had been there for five years.  There  seemed to be questionable compliance.   SUBSTANCE ABUSE HISTORY:  Denied the use or abuse of any substances.   PAST MEDICAL HISTORY:  Noncontributory.   MEDICATIONS:  He was not taking any medications.   PHYSICAL EXAMINATION:  Physical examination was performed, failed to show  any acute findings.   MENTAL STATUS EXAM:  Mental status exam revealed an alert, disheveled, young  male.  His eyes were darkened around, walking with legs apart, very guarded.  Speech was clear.  He was polite, laughing inappropriately.  He appeared to  be responding to internal stimuli.  Cognitive: Cognition was preserved, but  at times appears confused.   ADMISSION DIAGNOSES:   AXIS I:  Chronic paranoid schizophrenia.   AXIS II:  No diagnosis.   AXIS III:  Trauma to right hand.   AXIS IV:  Moderate.   AXIS V:  Global assessment of functioning upon admission 25, highest  global  assessment of functioning in the last year 60.   HOSPITAL COURSE:  He was admitted and started in intensive individual and  group psychotherapy.  He worked toward increasing understanding of his  illness, of the need for compliance with medications.  He was given  Risperdal and Ativan as needed for anxiety.  Risperdal was started at 0.5 mg  twice a day and 1 mg at night.  He started refusing medications.  He claimed  that he did not need any medications, he just had to go home.  He had to be  given Geodon 20 mg intramuscularly as he was still not sleeping, responding  to internal stimuli, inappropriate laughter.  He was also given some  Zyprexa.  He continued to evidence no insight.  He was willing  to contact  his payee who he felt was the only person he could trust.  He was given  Geodon 40 mg twice a day.  It was increased to 40 mg in the morning and then  80 mg at night but it was later increased to 40 mg in the morning and 100 mg  at night.  As the hospitalization progressed, he continued to evidence some  paranoia but there was some improvement in the behavior.  He stayed very  superficial, felt that he was a hostage in the hospital.  He started  sleeping better.  He was still saying there was nothing wrong with him.  He  seemed to be responding to internal stimuli but he was denying that he was  hearing any voices.  On January 5, he had continued to be compliant with  medications.  He did say he was going to stay on his medications and follow  up with his appointments.  He was denying any active hallucinations and  objectively, he was less guarded and less suspicious.  He was being  compliant.  He claimed that staying in the hospital made him feel worse.  It  seemed that this was baseline for him.  There were no suicidal ideas, no  homicidal ideas, no spontaneous delusional content.  He did not seem to be  responding to auditory and visual hallucinations, did not have any   agitation.  He was discharged to continue outpatient followup.   DISCHARGE DIAGNOSES:   AXIS I:  1. Schizophrenia, chronic paranoid type.  2. Rule out schizoaffective disorder.   AXIS II:  No diagnosis.   AXIS III:  No diagnosis.   AXIS IV:  Moderate.   AXIS V:  Global assessment of functioning upon discharge 50.   DISCHARGE MEDICATIONS:  1. Geodon 40 mg in the morning and 80 mg at night.  2. Ativan 0.5 mg three times a day as needed.   FOLLOW UP:  He was to follow up at [facility].                                                 Geoffery Lyons, M.D.    IL/MEDQ  D:  10/28/2002  T:  10/29/2002  Job:  [id number]

## 2011-02-12 NOTE — H&P (Signed)
[name], [name] NO.:  [id number]   MEDICAL RECORD NO.:  [id number]          PATIENT TYPE:  IPS   LOCATION:  0400                          FACILITY:  [facility]   PHYSICIAN:  Jeanice Lim, M.D. DATE OF BIRTH:  03/29/1976   DATE OF ADMISSION:  07/10/2005  DATE OF DISCHARGE:                         PSYCHIATRIC ADMISSION ASSESSMENT   IDENTIFYING INFORMATION:  This is a 35 year old single white male.  He is  well known to [place].  Yesterday, he apparently called the police and told them  that he did not feel safe.  He was reporting auditory hallucinations with  suicidal ideation and a plan.  His plan was to run into traffic or put a  rope around his neck.  He also acknowledged having been noncompliant with  his medications again.  He did indicate that he has been using marijuana  regularly.  He is known to use cocaine.  His urine drug screen is not yet  ready.  As he is cooperative and would be a voluntary admission he was  directly admitted without having first been medically cleared.   PAST PSYCHIATRIC HISTORY:  His last admission here was July 23-28.  He has  had numerous admissions to a variety of facilities.   SOCIAL HISTORY:  He finished the 10th grade.  He states his diagnosis of  schizophrenia was made in 1989.  He is not married.  He has no children.  He  is currently homeless.  He does have a friend in the community that does  deposit his disability checks for him and apparently that person was the  caller yesterday.   FAMILY HISTORY:  He denies.   ALCOHOL AND DRUG ABUSE:  He began using marijuana as a teenager.  He  acknowledges smoking a half pack of cigarettes per day.  He currently denies  alcohol or other drug use, although we know in fact that he does use cocaine  quite frequently.   POSITIVE PHYSICAL FINDINGS:  CHEST:  Clear.  HEART:  Regular rate and rhythm without murmurs, rubs or gallops.   LABORATORY DATA:  His intake lab work is pending.  VITAL SIGNS:  Not on the chart yet.  The most outstanding physical finding is the state of his teeth.  He looks  like he has methamphetamine mouth.  He has a variety of caries, broken  teeth, etc.  He states that his mouth is painful at times, although not at  present.   CURRENT PRESCRIBED MEDICATIONS:  Zyprexa 20 mg at h.s. by Dr. Gwyndolyn Kaufman and  Ambien 10 mg at h.s.   DRUG ALLERGIES:  No known drug allergies   MENTAL STATUS EXAM:  He was basically disheveled.  His eye contact was good.  His motor behavior was normal.  His speech was not pressured today although  it was pressured on admission.  His level of consciousness was alert.  His  mood was appropriate.  He recognizes me.  His affect was appropriate to the  situation.  His anxiety level:  He denied it at the moment.  Thought  processes were relevant,  although last night on admission he did display  flight of ideas.  His judgment remains poor.  He is oriented fully.  He had  normal concentration and memory.  His IQ is average.  His insight is poor,  impulse control was fair, appetite he reported was poor, however he does not  look like he has been going hungry, and he reported decreased sleep.  At the  moment,  he states that he has been hearing children's voices on and off and  now that he is within the confines of the hospital he is no longer actively  suicidal.   ADMISSION DIAGNOSES:  AXIS I:  Schizophrenia, paranoid exacerbation due to  noncompliance.  AXIS II:  Deferred.  AXIS III:  Methamphetamine mouth.  AXIS IV:  Severe.  AXIS V:  Global assessment of function is 25.   PLAN:  Plan is to re establish his psychotropic medications.  That was  initiated last night.  And we will get the case manager to have him actually  see a dentist.  His discharge plan last time in July included a dental  appointment, however apparently he never made that appointment.      Mickie Leonarda Salon, P.A.-C.      Jeanice Lim, M.D.   Electronically Signed    MD/MEDQ  D:  07/11/2005  T:  07/11/2005  Job:  [id number]
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
## 2011-02-12 NOTE — H&P (Signed)
[name], [name] NO.:  [id number]   MEDICAL RECORD NO.:  [id number]          PATIENT TYPE:  IPS   LOCATION:  0500                          FACILITY:  [facility]   PHYSICIAN:  Geoffery Lyons, M.D.      DATE OF BIRTH:  1976/07/30   DATE OF ADMISSION:  06/07/2006  DATE OF DISCHARGE:                         PSYCHIATRIC ADMISSION ASSESSMENT   IDENTIFYING INFORMATION:  This is a 35 year old white male who is single.  This is a voluntary admission.   HISTORY OF PRESENT ILLNESS:  This is one of several admissions for this 47-  year-old white male well-known to [place] from prior admissions.  He has a  history of schizophrenia, paranoid-type and is known to use cocaine and  marijuana.  He presented in the emergency room complaining of having  auditory commands with thoughts of hitting and beating other people and also  harming himself and his insight is adequate that he understands that he does  not want to harm or hit other people.  Also he had some auditory  hallucinations to jump off a bridge which he knew he did not want to do.  Had been off his Zyprexa for at least two weeks and the hallucinations were  getting worse.  He did endorse using cocaine.  He had previously left his  group home and had quit taking his medications and, according to the record,  he had been staying in a motel with his uncle.  He endorsed recent cocaine  use.  He was able to contract for safety on admission on our unit.  He is  sedated today and not able to give much by way of an interview.   PAST PSYCHIATRIC HISTORY:  The patient is followed by Dr. Ezzard Flax at  [facility].  His last admission here at [facility] was in  October of 2006 for similar symptoms.  He was here for four days and also  April 18, 2005 to April 23, 2005.  Does have a history of cocaine and  marijuana and was initially diagnosed with schizophrenia in 1989.  He has  been stabilized on Zyprexa and Ambien.   SOCIAL  HISTORY:  This is a single, white male, never married.  No children.  Tenth grade education.  He currently is on probation for charges for drug  paraphernalia.  Exactly what his home situation is is unclear but he reports  that he has been living in a motel with his uncle.   FAMILY HISTORY:  He denies any family history of mental illness or substance  abuse.   ALCOHOL/DRUG HISTORY:  As noted above.  He also does smoke 1/2-1 pack per  day of cigarettes.   MEDICAL HISTORY:  The patient is followed at [facility] as needed.  Medical problems are generally none.  He did recently have a cyst or boil on  his leg that was treated over there at Urgent Care.   PHYSICAL EXAMINATION:  Generally, he has been healthy, well-nourished male  who is in no acute distress.  On admission to the unit, height 5  feet 10  inches tall, weight 208 pounds, temperature 97.8, pulse 77, respirations 18,  blood pressure 122/74.  Full physical examination was done in the emergency  room.  It is noted in the record.  We do note that he shows no symptoms of  EPS.  Neuro was symmetrical and nonfocal.  He is disheveled and dirty at the  time of admission.  The condition of his teeth if very poor.  Multiple  caries, dark spots, they appear to be rotting.   CURRENT MEDICATIONS:  Zyprexa 20 mg p.o. q.h.s. which he is not currently  taking and Ambien 10 mg q.h.s.   ALLERGIES:  None.   LABORATORY DATA:  CBC with WBC 8.7, hemoglobin 16.2, hematocrit 45.7,  platelets 233,000.  Chemistries with sodium 142, potassium 4.2, chloride  106, carbon dioxide 29, BUN 8, creatinine 1.3 and random glucose is 92.  Liver enzymes with SGOT 18, SGPT 14, alkaline phosphatase 108 and total  bilirubin 1.  Magnesium within normal limits at 10.3 and calcium 9.8.  His  TSH is currently pending.  Urinalysis and urine drug screen:  The patient  has been unable to give urine up until this time.  We have ordered those  tests.   MENTAL STATUS  EXAM:  He is sedated but arousable. He attempts to cooperate  but falls back to sleep.  We did speak to him yesterday at the time of  admission.  He is able to give [place] some history then.  He is really unable to  do a full mental status exam today because of his sleepiness and he has  received his Zyprexa.   DIAGNOSES:  AXIS I:  Schizophrenia, paranoid-type.  Cocaine abuse; rule out  dependence.  AXIS II:  Deferred.  AXIS III:  Poor dentition, multiple caries.  AXIS IV:  Deferred.  AXIS V:  Current 30; past year 41.   PLAN:  Voluntarily admit the patient, to alleviate or control his auditory  hallucinations and suicidal ideation.  We will also treat his cocaine  addiction.  We are going to resume his Zyprexa 20 mg q.h.s., put him on  Ambien 10 mg p.r.n. h.s. for insomnia and start him on Symmetrel 100 mg p.o.  b.i.d. to treat his cocaine dependence.  We will ask our casemanager to  check into his social and home situation, what if any intervention we can  offer there and have started him on a nicotine patch 21 mg for smoking  cessation.   ESTIMATED LENGTH OF STAY:  Five to six days.      Margaret A. Scott, N.P.      Geoffery Lyons, M.D.  Electronically Signed    MAS/MEDQ  D:  06/08/2006  T:  06/08/2006  Job:  [id number]
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
## 2011-02-12 NOTE — Discharge Summary (Signed)
[name], [name]                           ACCOUNT NO.:  [id number]   MEDICAL RECORD NO.:  [id number]                   PATIENT TYPE:  IPS   LOCATION:  0405                                 FACILITY:  [facility]   PHYSICIAN:  Geoffery Lyons, M.D.                   DATE OF BIRTH:  Nov 17, 1975   DATE OF ADMISSION:  12/02/2003  DATE OF DISCHARGE:  12/05/2003                                 DISCHARGE SUMMARY   CHIEF COMPLAINT AND PRESENTING ILLNESS:  This was the fourth admission to  [facility]  for this for this 35 year old white male,  single, voluntarily admitted.  Presented himself to the hospital with a  guarded affect and appeared confused.  Sent to emergency room with a medical  clearance and diagnosed with an upper respiratory infection.  Urine drug  screen was positive for cocaine.  Upon admission, appears perplexed,  distracted, listening to voices, unable to attend for interview.  Unable to  give history.  He had admitted to feeling suicide without any clear plan.   PAST PSYCHIATRIC HISTORY:  Fourth admission to [facility]  Seen at [facility] but his compliance was not  clear.  History of cocaine abuse, psychosis, positive history of  schizophrenia, auditory hallucinations and suicidal ideas.  Previously on  Zyprexa 30 mg at night.   ALCOHOL AND DRUG HISTORY:  History of cocaine abuse, recent use.   PAST MEDICAL HISTORY:  Upper respiratory infection with congestion diagnosed  in the emergency room.   MEDICATIONS:  Taking none at this time.   PHYSICAL EXAMINATION:  Performed, failed to show any acute findings.   LABORATORY WORKUP:  CBC:  White blood cells 11,400.  Electrolytes:  Potassium 3.4, BUN 6, creatinine 1.0.  Urine drug screen positive for  cocaine.   MENTAL STATUS EXAM:  Reveals a fully alert male, cooperative, distracted,  appears to be listening to something.  Directable without too much  difficulty,  attempts to be cooperative but is unable to attend the  conversation.  Some psychomotor restlessness, no signs of aggression, speech  is without pressure.  Responses are fragmented.  Glances around the room and  will say 1 or 2 words afterwards.  Appears not to have heard the question.  Mood seems depressed and anxious.  Affect was labile, at times looking  concerned and worried, while at times mildly appearing distracted.  After  much questioning, appears to state his name, the correct date, and where he  is and the situation.  Thought processes are positive for auditory  hallucinations, some mild thought agitation and restlessness from time to  time.  Significant thought blocking.   ADMISSION DIAGNOSES:   AXIS I:  1. Psychotic disorder not otherwise specified versus schizophrenia or     schizoaffective disorder.  2. Cocaine abuse.   AXIS II:  No diagnosis.  AXIS III:  Upper respiratory infection.   AXIS IV:  Moderate.   AXIS V:  Global assessment of function upon admission 25, highest global  assessment of function in past year 60.   COURSE IN HOSPITAL:  He was admitted and started on intensive individual and  group psychotherapy.  He was given Ambien for sleep.  He was treated with  Septra.  He was given Seroquel 300 with Ativan and Seroquel was increased to  400 at bedtime.  Initially somewhat confused, laughing inappropriately at  times, staring at a wall, needing redirections.  Off medications for awhile,  evidencing acute psychotic symptoms.  Had been off medications for months.  Denied any cocaine use although drug screen was positive.  Somewhat reserved  and guarded, said that he was willing to stay on medication this time  around.  Did respond pretty well to the milieu and the medications.  On  Monday night he was much better, somewhat guarded about certain tings but  overall better.  Would like to be going home.  He promised to stay on his  medication, needing help  to be able to afford it.  On March 10 he was much  better, there were no active hallucinations, no active delusions, no  suicidal ideas, no homicidal ideas.  Back on medications, stated that he was  going to stay on the medication.  His affect was brighter, broad, relaxed.  So it was felt that he had obtained full benefit from the hospitalization  and he was stable enough to follow up on an outpatient basis.   DISCHARGE DIAGNOSES:   AXIS I:  1. Schizoaffective disorder.  2. Cocaine abuse.   AXIS II:  No diagnosis.   AXIS III:  Upper respiratory infection.   AXIS IV:  Moderate.   AXIS V:  Global assessment of function upon discharge 50.   DISCHARGE MEDICATIONS:  1. Septra DS 1 twice a day for 8 days.  2. Seroquel 400 at night.   DISPOSITION:  Follow up [facility].                                               Geoffery Lyons, M.D.    IL/MEDQ  D:  01/06/2004  T:  01/07/2004  Job:  [id number]

## 2011-02-12 NOTE — Discharge Summary (Signed)
NAMEZORAWAR, STROLLO                           ACCOUNT NO.:  [id number]   MEDICAL RECORD NO.:  [id number]                   PATIENT TYPE:  IPS   LOCATION:  0402                                 FACILITY:  [facility]   PHYSICIAN:  Carolanne Grumbling, M.D.                 DATE OF BIRTH:  05/15/1976   DATE OF ADMISSION:  04/03/2003  DATE OF DISCHARGE:  04/08/2003                                 DISCHARGE SUMMARY   IDENTIFYING INFORMATION:  Mr. Krasowski is a 35 year old man.   INITIAL ASSESSMENT AND DIAGNOSIS:  Mr. Sandoz was admitted to the hospital  because he had reported that he had been thinking about hurting himself; he  had no plan to harm himself, but he had been having the thoughts.  He had  been off his medicines for three to four months.  Sleep had decreased.  Appetite had been fair.  He also had been smoking crack cocaine and had  reportedly begun hearing voices again.  This was his second hospitalization  to this hospital.  He sees Dr. Hortencia Pilar at [facility].  He also has been at [facility] two years ago and [place] two months  ago.   MENTAL STATUS EXAM:  Mental status at the time of the initial evaluation  revealed a young man who was in his bed with his eyes closed.  He seemed  sedated.  He offered little information.  Speech was clear.  He seemed to be  tired.  Thought processes were coherent, though he did not respond very  much.  He did not appear to be responding to internal stimuli at the time.  He was oriented.  Judgment and insight were poor.   PHYSICAL EXAMINATION:  GENERAL:  Physical examination was noncontributory.   ADMISSION DIAGNOSES:   AXIS I:  1. Schizophrenia.  2. Cocaine abuse.   AXIS II:  Deferred.   AXIS III:  No diagnosis.   AXIS IV:  Moderate.   AXIS V:  30/60.   FINDINGS:  All indicated laboratory examinations were noncontributory.   HOSPITAL COURSE:  While in the hospital, Mr. Tagg remained withdrawn for  the most part.  He  spent his time in his bed; he did not get up and go to  groups.  He initially would not look at [place] when he talked and would only  mumble answers to questions.  By the time of discharge, he seemed to be  feeling much better; his affect was brighter, he was out of bed, he was  interacting minimally with others.  Affect was appropriate at the time.  He  was denying any voices, denying any suicidal thoughts, and consequently, he  was discharged home at his request.   FINAL DIAGNOSES:    AXIS I:  1. Schizophrenia.  2. Cocaine abuse.   AXIS II:  Deferred.   AXIS III:  No diagnosis.  AXIS IV:  Moderate.   AXIS V:  His level of functioning had increased to 50.   POST HOSPITAL CARE PLANS:   DISCHARGE MEDICATIONS:  At the time of discharge, he was taking:  1. Zyprexa 20 mg at bedtime.  2. Trazodone 50 mg at bedtime.   ACTIVITY/DIET:  There were no restrictions placed on his activity or his  diet.   FOLLOW UP:  He is to follow up at [facility] with an appointment  with Dr. Lang Snow on April 10, 2003.                                               Carolanne Grumbling, M.D.    GT/MEDQ  D:  04/17/2003  T:  04/17/2003  Job:  [id number]

## 2011-05-14 ENCOUNTER — Emergency Department (HOSPITAL_COMMUNITY)
Admission: EM | Admit: 2011-05-14 | Discharge: 2011-05-15 | Disposition: A | Discharge status: Other Acute Inpatient Hospital | Payer: Medicare Other | Source: Home / Self Care | Attending: Emergency Medicine | Admitting: Emergency Medicine

## 2011-05-14 DIAGNOSIS — Z79899 Other long term (current) drug therapy: Secondary | ICD-10-CM | POA: Insufficient documentation

## 2011-05-14 DIAGNOSIS — F29 Unspecified psychosis not due to a substance or known physiological condition: Secondary | ICD-10-CM | POA: Insufficient documentation

## 2011-05-15 ENCOUNTER — Inpatient Hospital Stay (HOSPITAL_COMMUNITY)
Admission: AD | Admit: 2011-05-15 | Discharge: 2011-05-18 | DRG: 885 | Disposition: A | Discharge status: Home / Self Care | Payer: Medicare Other | Source: Ambulatory Visit | Attending: Psychiatry | Admitting: Psychiatry

## 2011-05-15 DIAGNOSIS — R45851 Suicidal ideations: Secondary | ICD-10-CM

## 2011-05-15 DIAGNOSIS — F1420 Cocaine dependence, uncomplicated: Secondary | ICD-10-CM

## 2011-05-15 DIAGNOSIS — F205 Residual schizophrenia: Principal | ICD-10-CM

## 2011-05-15 DIAGNOSIS — F29 Unspecified psychosis not due to a substance or known physiological condition: Secondary | ICD-10-CM

## 2011-05-15 DIAGNOSIS — Z560 Unemployment, unspecified: Secondary | ICD-10-CM

## 2011-05-15 LAB — COMPREHENSIVE METABOLIC PANEL
AST: 15 U/L (ref 0–37)
CO2: 28 mEq/L (ref 19–32)
Chloride: 100 mEq/L (ref 96–112)
Creatinine, Ser: 0.87 mg/dL (ref 0.50–1.35)
GFR calc non Af Amer: >60 mL/min (ref >60)
Glucose, Bld: 84 mg/dL (ref 70–99)
Total Bilirubin: 0.7 mg/dL (ref 0.3–1.2)

## 2011-05-15 LAB — DIFFERENTIAL
Basophils Absolute: 0.1 10*3/uL (ref 0.0–0.1)
Basophils Relative: 1 % (ref 0–1)
Eosinophils Absolute: 0.4 10*3/uL (ref 0.0–0.7)
Eosinophils Relative: 4 % (ref 0–5)
Neutrophils Relative %: 63 % (ref 43–77)

## 2011-05-15 LAB — RAPID URINE DRUG SCREEN, HOSP PERFORMED
Barbiturates: NOT DETECTED
Cocaine: POSITIVE — AB
Opiates: NOT DETECTED

## 2011-05-15 LAB — CBC
HCT: 42.8 % (ref 39.0–52.0)
MCHC: 35.7 g/dL (ref 30.0–36.0)
Platelets: 172 10*3/uL (ref 150–400)
RDW: 13 % (ref 11.5–15.5)

## 2011-05-16 DIAGNOSIS — F259 Schizoaffective disorder, unspecified: Secondary | ICD-10-CM

## 2011-05-16 DIAGNOSIS — F1420 Cocaine dependence, uncomplicated: Secondary | ICD-10-CM

## 2011-05-19 NOTE — Discharge Summary (Signed)
  NAMEOMARRI, EICH NO.:  [id number]  MEDICAL RECORD NO.:  [id number]  LOCATION:  0406                          FACILITY:  [facility]  PHYSICIAN:  Eulogio Ditch, MD DATE OF BIRTH:  [birth year]  DATE OF ADMISSION:  05/15/2011 DATE OF DISCHARGE:  05/18/2011                              DISCHARGE SUMMARY   HISTORY OF PRESENT ILLNESS:  Please refer to initial psych assessment for details.  Briefly, a 35 year old male who was admitted after use of crack cocaine.  The patient verbalized hearing voices and having suicidal thoughts.  HOSPITAL COURSE:  During the hospital stay, the patient was started on Prolixin 5 mg p.o. daily along with amantadine 100 mg twice a day.  The patient responded to the medication well without any side effects.  On May 18, 2011, the patient was doing very well, logical and goal- directed, not hallucinating or delusional, not suicidal or homicidal. The patient wanted to be discharged and follow up in the outpatient setting.  As the patient was stable for discharge, the patient was seen in the treatment team and psychoeducation given to the patient to stay away from drugs and to follow up in the outpatient setting regularly. The patient agreed with that.  DISCHARGE DIAGNOSES:  Axis I:  Chronic cocaine dependence; psychosis, not otherwise specified; rule out schizophrenia, paranoid type. Axis II:  Deferred. Axis III:  No active medical issue. Axis IV:  Chronic substance abuse. Axis V:  55.  DISCHARGE MEDICATIONS: 1. Prolixin 5 mg p.o. daily. 2. Amantadine 100 mg p.o. twice a day.  DISCHARGE FOLLOWUP:  The patient will follow up at [facility], phone number [phone].     Eulogio Ditch, MD     SA/MEDQ  D:  05/18/2011  T:  05/18/2011  Job:  [id number]  Electronically Signed by Eulogio Ditch  on 05/19/2011 08:50:14 AM

## 2011-05-19 NOTE — Assessment & Plan Note (Signed)
NAMEPHILLIP, [name] NO.:  [id number]  MEDICAL RECORD NO.:  [id number]  LOCATION:  0406                          FACILITY:  [facility]  PHYSICIAN:  Eulogio Ditch, MD DATE OF BIRTH:  08/02/1976  DATE OF ADMISSION:  05/15/2011 DATE OF DISCHARGE:                      PSYCHIATRIC ADMISSION ASSESSMENT   Assessment was on May 16, 2011 at 1310.  IDENTIFYING INFORMATION:  A 35 year old Caucasian male, single.  This is a voluntary admission.  HISTORY OF PRESENT ILLNESS:  This is 1 of several [facility] admissions for [name] with a history of chronic schizophrenia and cocaine dependence.  He was admitted for an exacerbation of his symptoms, including auditory hallucinations and suicidal thoughts with a plan to cut himself after he went on a cocaine binge.  He says that he got around the wrong people, indulged in cocaine and was feeling out of control.  His longest period of abstinence is not clear.  He denies that he was using any alcohol. Urine drug screen was noted to be positive for cocaine.  PAST PSYCHIATRIC HISTORY:  One of several [facility] admissions for [name], who is currently followed at his own residence by an ACT Team.  He was previously stabilized on Prolixin, decanoate, and 5 mg daily.  His current medications are unclear.  He says that he does take an injectable medication and has a case manager that sees him regularly. In the past he has taken Zyprexa 20 mg daily to control his psychosis. Previous trials of Seroquel have been ineffective.  SOCIAL HISTORY:  Single, never married.  No children.  Previously lived at [facility] and now resides in his own residence.  He is on disability for his mental illness.  He denies current legal problems.  FAMILY HISTORY:  Not available.  MEDICAL HISTORY:  No regular primary care provider.  MEDICAL PROBLEMS:  No known acute or chronic medical issues.  Medications have not been verified.  Fluphenazine decanoate injection  50 mg every 2 weeks.  Last date not clear. Fluphenazine 5 mg q.a.m.  Also possibly Depakote and amantadine 100 mg twice daily for cocaine dependence.  DRUG ALLERGIES:  Haldol with unknown reaction.  PHYSICAL EXAMINATION:  Physical exam was done in the emergency room and is noted in the record.  [name] is a normally-developed young man missing essentially all of his teeth and appears to be rather disheveled.  No abnormal movements.  No signs of EPS or tardive movements.  He weighs 87 kg, 5 feet 11 inches tall. ADMITTING VITAL SIGNS:  Temperature 97.6, pulse 96, respirations 16, blood pressure 121/77. He does not appear to be internally distracted today.  CBC is normal with a hemoglobin of 15.3.  Chemistry remarkable for a mildly decreased potassium at 3.3, otherwise normal.  BUN 8, creatinine 0.87.  Urine drug screen positive for cocaine and alcohol screen negative.  MENTAL STATUS EXAM:  Fully alert male.  He says he has been having some auditory hallucinations.  No commands for harm.  Does not appear internally distracted, has been interacting appropriately today.  Admits that he has been using cocaine.  Knows he got in a bad situation.  Wants to continue his medications, get stable, and  return to his own residence.  His thinking is pretty logical, goal oriented.  No dangerous ideas today.  DIAGNOSES:  AXIS I: 1. Schizophrenia, undifferentiated type. 2. Chronic cocaine dependence.  AXIS II:  Deferred.  AXIS III:  No diagnosis.  AXIS IV:  Deferred.  AXIS V:  Current is 35.  Past year 33.  ESTIMATED PLAN OF CARE:  Voluntarily admit him with a goal of stabilizing his psychosis and getting back to his baseline functioning and safe withdrawal from the cocaine.  We are going to put him back on fluphenazine 5 mg daily that he was previously taking and will get with his case manager and validate their treatment plan for his medications.     Margaret A. Lorin Picket,  N.P.   ______________________________ Eulogio Ditch, MD    MAS/MEDQ  D:  05/16/2011  T:  05/16/2011  Job:  [id number]  Electronically Signed by Kari Baars N.P. on 05/18/2011 10:20:49 AM Electronically Signed by Eulogio Ditch  on 05/19/2011 08:50:07 AM

## 2011-06-16 LAB — I-STAT 8, (EC8 V) (CONVERTED LAB)
BUN: 9
Bicarbonate: 28 — ABNORMAL HIGH
Chloride: 105
HCT: 48
Hemoglobin: 16.3
Operator id: 198171
Sodium: 139

## 2011-06-16 LAB — HEPATIC FUNCTION PANEL
ALT: 23
Albumin: 3.5
Alkaline Phosphatase: 123 — ABNORMAL HIGH
Indirect Bilirubin: 1.2 — ABNORMAL HIGH
Total Protein: 6.8

## 2011-06-16 LAB — CBC
HCT: 42.8
Hemoglobin: 15.2
MCHC: 35.7
MCHC: 35.9
Platelets: 243
RBC: 4.46
RDW: 13.5

## 2011-06-16 LAB — DIFFERENTIAL
Basophils Absolute: 0
Basophils Absolute: 0.1
Basophils Relative: 0
Basophils Relative: 1
Eosinophils Absolute: 0.5
Eosinophils Relative: 6 — ABNORMAL HIGH
Monocytes Absolute: 0.5
Monocytes Relative: 5
Neutro Abs: 8.3 — ABNORMAL HIGH
Neutrophils Relative %: 75

## 2011-06-16 LAB — BASIC METABOLIC PANEL
CO2: 29
Calcium: 9.7
Creatinine, Ser: 0.98
GFR calc Af Amer: >60

## 2011-06-16 LAB — CARDIAC PANEL(CRET KIN+CKTOT+MB+TROPI): Troponin I: 0.03

## 2011-06-16 LAB — RAPID URINE DRUG SCREEN, HOSP PERFORMED
Barbiturates: NOT DETECTED
Cocaine: POSITIVE — AB
Opiates: NOT DETECTED
Tetrahydrocannabinol: NOT DETECTED

## 2011-06-16 LAB — ETHANOL: Alcohol, Ethyl (B): 5

## 2011-07-02 LAB — CBC
HCT: 43.7
MCV: 90
RBC: 4.86
WBC: 9.1

## 2011-07-02 LAB — BASIC METABOLIC PANEL
Chloride: 104
GFR calc Af Amer: >60
GFR calc non Af Amer: >60
Potassium: 3.4 — ABNORMAL LOW
Sodium: 139

## 2011-07-02 LAB — DIFFERENTIAL
Eosinophils Absolute: 0.2
Eosinophils Relative: 2
Lymphocytes Relative: 26
Lymphs Abs: 2.3
Monocytes Relative: 7

## 2011-07-02 LAB — ETHANOL: Alcohol, Ethyl (B): <5

## 2011-07-02 LAB — HEPATIC FUNCTION PANEL
ALT: 23
Alkaline Phosphatase: 106
Bilirubin, Direct: 0.1
Indirect Bilirubin: 1 — ABNORMAL HIGH
Total Bilirubin: 1.1

## 2011-07-02 LAB — RAPID URINE DRUG SCREEN, HOSP PERFORMED: Tetrahydrocannabinol: POSITIVE — AB

## 2011-09-25 ENCOUNTER — Encounter: Payer: Self-pay | Admitting: Emergency Medicine

## 2011-09-25 ENCOUNTER — Emergency Department (HOSPITAL_COMMUNITY)
Admission: EM | Admit: 2011-09-25 | Discharge: 2011-09-27 | Disposition: A | Discharge status: Other Acute Inpatient Hospital | Payer: Medicare Other | Attending: Emergency Medicine | Admitting: Emergency Medicine

## 2011-09-25 ENCOUNTER — Other Ambulatory Visit: Payer: Self-pay

## 2011-09-25 DIAGNOSIS — R443 Hallucinations, unspecified: Secondary | ICD-10-CM | POA: Insufficient documentation

## 2011-09-25 DIAGNOSIS — Z8659 Personal history of other mental and behavioral disorders: Secondary | ICD-10-CM | POA: Insufficient documentation

## 2011-09-25 DIAGNOSIS — F1910 Other psychoactive substance abuse, uncomplicated: Secondary | ICD-10-CM

## 2011-09-25 DIAGNOSIS — R079 Chest pain, unspecified: Secondary | ICD-10-CM | POA: Insufficient documentation

## 2011-09-25 DIAGNOSIS — R45851 Suicidal ideations: Secondary | ICD-10-CM | POA: Insufficient documentation

## 2011-09-25 HISTORY — DX: Schizophrenia, unspecified: F20.9

## 2011-09-25 NOTE — ED Provider Notes (Signed)
History     CSN: [id number]  Arrival date & time 09/25/11  2323   First MD Initiated Contact with Patient 09/25/11 2332      Chief Complaint  Patient presents with  . Medical Clearance    (Consider location/radiation/quality/duration/timing/severity/associated sxs/prior treatment) HPI Comments: 35 year old male with history of schizophrenia, substance abuse who presents with request for detox, chest pain, persistent hallucinations.  #1 detox  Patient states that he has been using heroin IV and crack cocaine almost daily. Symptoms are getting worse, more frequent, associated chest pain today.  #2 hallucinations  Patient states that he has been off of his medications for his schizophrenia because he states that the voices in his head: Not to take them because people we think that he is crazy. He states that he has constant visual and auditory hallucinations which are persecutory where he thinks that the devil is going to try to kill him. He denies suicidal thoughts or self-harm.  #3 chest pain  Patient has had mild chest pain after smoking crack cocaine today.  Currently pain is mild, not associated with shortness of breath or swelling.  The history is provided by the patient.    Past Medical History  Diagnosis Date  . Schizophrenic disorder     History reviewed. No pertinent past surgical history.  History reviewed. No pertinent family history.  History  Substance Use Topics  . Smoking status: Not on file  . Smokeless tobacco: Not on file  . Alcohol Use:       Review of Systems  All other systems reviewed and are negative.    Allergies  Haldol  Home Medications   Current Outpatient Rx  Name Route Sig Dispense Refill  . OLANZAPINE 20 MG PO TABS Oral Take 20 mg by mouth at bedtime.        BP 109/62  Pulse 67  Temp(Src) 97.6 F (36.4 C) (Oral)  Resp 18  SpO2 99%  Physical Exam  Nursing note and vitals reviewed. Constitutional: He appears  well-developed and well-nourished. No distress.  HENT:  Head: Normocephalic and atraumatic.  Mouth/Throat: Oropharynx is clear and moist. No oropharyngeal exudate.  Eyes: Conjunctivae and EOM are normal. Pupils are equal, round, and reactive to light. Right eye exhibits no discharge. Left eye exhibits no discharge. No scleral icterus.  Neck: Normal range of motion. Neck supple. No JVD present. No thyromegaly present.  Cardiovascular: Normal rate, regular rhythm, normal heart sounds and intact distal pulses.  Exam reveals no gallop and no friction rub.   No murmur heard. Pulmonary/Chest: Effort normal and breath sounds normal. No respiratory distress. He has no wheezes. He has no rales.  Abdominal: Soft. Bowel sounds are normal. He exhibits no distension and no mass. There is no tenderness.  Musculoskeletal: Normal range of motion. He exhibits no edema and no tenderness.  Lymphadenopathy:    He has no cervical adenopathy.  Neurological: He is alert. Coordination normal.  Skin: Skin is warm and dry. No rash noted. No erythema.  Psychiatric:       Flat affect, no suicidal thoughts, positive visual and auditory hallucinations    ED Course  Procedures (including critical care time)  Labs Reviewed  COMPREHENSIVE METABOLIC PANEL - Abnormal; Notable for the following:    GFR calc non Af Amer 83 (*)    All other components within normal limits  URINE RAPID DRUG SCREEN (HOSP PERFORMED) - Abnormal; Notable for the following:    Cocaine POSITIVE (*)    Tetrahydrocannabinol  POSITIVE (*)    All other components within normal limits  CBC  ETHANOL  POCT I-STAT TROPONIN I  I-STAT TROPONIN I   No results found.   1. Substance abuse   2. Chest pain   3. Suicidal thoughts       MDM  Vital signs appear within normal limits other than slightly elevated blood pressure. Check EKG, workup for chest pain after crack cocaine use, substance abuse medical clearance for placement. Patient is not a  risk of harm to himself or others at this time and he is here voluntarily.   ED ECG REPORT   Date: 09/26/2011   Rate: 80  Rhythm: normal sinus rhythm  QRS Axis: normal  Intervals: normal  ST/T Wave abnormalities: normal  Conduction Disutrbances:none  Narrative Interpretation:   Old EKG Reviewed: unchanged and Since 10/04/2007   Drug screen reviewed, positive for cocaine and MJ, trop neg and other labs cleared for placement.    Change of shift - care signed out to Dr. Tobey Grim, MD 09/26/11 [phone]

## 2011-09-25 NOTE — ED Notes (Signed)
Pt is schizophrenic. Has not taken meds tonight. Pt answers "affirmative" to everything. Admits to taking crack cocaine earlier today. Flu like symptoms for the last two days.

## 2011-09-25 NOTE — ED Notes (Signed)
WUJ:[id number]<JY> Expected date:09/25/11<BR> Expected time:11:19 PM<BR> Means of arrival:Ambulance<BR> Comments:<BR> Medical clearance

## 2011-09-26 ENCOUNTER — Encounter (HOSPITAL_COMMUNITY): Payer: Self-pay

## 2011-09-26 LAB — COMPREHENSIVE METABOLIC PANEL
AST: 14 U/L (ref 0–37)
BUN: 12 mg/dL (ref 6–23)
CO2: 29 mEq/L (ref 19–32)
Calcium: 9.2 mg/dL (ref 8.4–10.5)
Chloride: 101 mEq/L (ref 96–112)
Creatinine, Ser: 1.13 mg/dL (ref 0.50–1.35)
GFR calc Af Amer: >90 mL/min (ref >90)
GFR calc non Af Amer: 83 mL/min — ABNORMAL LOW (ref >90)
Total Bilirubin: 0.6 mg/dL (ref 0.3–1.2)

## 2011-09-26 LAB — RAPID URINE DRUG SCREEN, HOSP PERFORMED
Cocaine: POSITIVE — AB
Opiates: NOT DETECTED

## 2011-09-26 LAB — ETHANOL: Alcohol, Ethyl (B): <11 mg/dL (ref 0–11)

## 2011-09-26 LAB — POCT I-STAT TROPONIN I

## 2011-09-26 LAB — CBC
HCT: 40.9 % (ref 39.0–52.0)
MCH: 32.3 pg (ref 26.0–34.0)
MCV: 89.9 fL (ref 78.0–100.0)
Platelets: 208 10*3/uL (ref 150–400)
RBC: 4.55 MIL/uL (ref 4.22–5.81)
WBC: 6.8 10*3/uL (ref 4.0–10.5)

## 2011-09-26 MED ORDER — OLANZAPINE 5 MG PO TABS
20.0000 mg | ORAL_TABLET | Freq: Every day | ORAL | Status: DC
  Administered 2011-09-26: 20 mg via ORAL
  Filled 2011-09-26: qty 4

## 2011-09-26 MED ORDER — IBUPROFEN 600 MG PO TABS: 600.0000 mg | ORAL_TABLET | Freq: Three times a day (TID) | ORAL | Status: DC | PRN

## 2011-09-26 MED ORDER — LORAZEPAM 1 MG PO TABS: 1.0000 mg | ORAL_TABLET | Freq: Three times a day (TID) | ORAL | Status: DC | PRN

## 2011-09-26 MED ORDER — ONDANSETRON HCL 4 MG PO TABS: 4.0000 mg | ORAL_TABLET | Freq: Three times a day (TID) | ORAL | Status: DC | PRN

## 2011-09-26 MED ORDER — ACETAMINOPHEN 325 MG PO TABS: 650.0000 mg | ORAL_TABLET | ORAL | Status: DC | PRN

## 2011-09-26 MED ORDER — ALUM & MAG HYDROXIDE-SIMETH 200-200-20 MG/5ML PO SUSP: 30.0000 mL | ORAL | Status: DC | PRN

## 2011-09-26 MED ORDER — ZOLPIDEM TARTRATE 5 MG PO TABS: 5.0000 mg | ORAL_TABLET | Freq: Every evening | ORAL | Status: DC | PRN

## 2011-09-26 MED ORDER — NICOTINE 21 MG/24HR TD PT24
21.0000 mg | MEDICATED_PATCH | Freq: Every day | TRANSDERMAL | Status: DC
  Filled 2011-09-26: qty 1

## 2011-09-26 NOTE — ED Notes (Signed)
Spoke with Annice Pih at [facility] who confirms patient's information has been received and will be ran past physician. Per Patsy at [facility], patient's information has been received and will be ran past physician after shift change.

## 2011-09-26 NOTE — ED Notes (Signed)
CSW contacted [name] to follow up on insurance process for referral. Staff at [employer] informed Company secretary is unavailable until Monday morning to verify pt's insurance. Staff stated coordinator would be in at [facility] tomorrow morning. RN will be notified.

## 2011-09-26 NOTE — BH Assessment (Signed)
Assessment Note   [name] is a 35 y.o. male brought in by EMS. Patient had a difficult time responding to open ended questions, staring at this clinician until asked yes or no questions. Patient endorses SI with plan but was unwilling to disclose details. He endorses auditory hallucinations with commands, stating voices tell him not to take prescribed medicine. Patient also endorses experiencing visual hallucinations but is unable to disclose details. Patient reports using crack cocaine 09/25/11. Patient reportedly has a history of schizophrenia and is prescribed Zyprexa. Patient states that he has not taken medication for past few days, does not recall when last dosage was. Patient reports racing thoughts and a decrease in sleep. Patient denies any HI. He is voluntarily seeking inpatient treatment.  Axis I: Psychotic Disorder NOS Axis II: Deferred Axis III:  Past Medical History  Diagnosis Date  . Schizophrenic disorder    Axis IV: other psychosocial or environmental problems Axis V: 21-30 behavior considerably influenced by delusions or hallucinations OR serious impairment in judgment, communication OR inability to function in almost all areas  Past Medical History:  Past Medical History  Diagnosis Date  . Schizophrenic disorder     History reviewed. No pertinent past surgical history.  Family History: History reviewed. No pertinent family history.  Social History:  does not have a smoking history on file. He does not have any smokeless tobacco history on file. He reports that he uses illicit drugs (Cocaine). His alcohol history not on file.  Additional Social History:  Alcohol / Drug Use History of alcohol / drug use?: Yes Substance #1 Name of Substance 1: Crack 1 - Last Use / Amount: 09/25/11 Allergies:  Allergies  Allergen Reactions  . Haldol (Haloperidol Decanoate)     Home Medications:  Medications Prior to Admission  Medication Dose Route Frequency Provider Last  Rate Last Dose  . acetaminophen (TYLENOL) tablet 650 mg  650 mg Oral Q4H PRN Vida Roller, MD      . alum & mag hydroxide-simeth (MAALOX/MYLANTA) 200-200-20 MG/5ML suspension 30 mL  30 mL Oral PRN Vida Roller, MD      . ibuprofen (ADVIL,MOTRIN) tablet 600 mg  600 mg Oral Q8H PRN Vida Roller, MD      . LORazepam (ATIVAN) tablet 1 mg  1 mg Oral Q8H PRN Vida Roller, MD      . nicotine (NICODERM CQ - dosed in mg/24 hours) patch 21 mg  21 mg Transdermal Daily Vida Roller, MD      . OLANZapine [facility]) tablet 20 mg  20 mg Oral QHS Vida Roller, MD      . ondansetron [facility]) tablet 4 mg  4 mg Oral Q8H PRN Vida Roller, MD      . zolpidem (AMBIEN) tablet 5 mg  5 mg Oral QHS PRN Vida Roller, MD       Medications Prior to Admission  Medication Sig Dispense Refill  . OLANZapine (ZYPREXA) 20 MG tablet Take 20 mg by mouth at bedtime.          OB/GYN Status:  No LMP for male patient.  General Assessment Data Location of Assessment: [facility] ED ACT Assessment: Yes Living Arrangements: Alone Can pt return to current living arrangement?: Yes Admission Status: Voluntary Is patient capable of signing voluntary admission?: Yes Referral Source:  (EMS)     Risk to self Suicidal Ideation: Yes-Currently Present Suicidal Intent: Yes-Currently Present Is patient at risk for suicide?: Yes Suicidal Plan?: Yes-Currently  Present Specify Current Suicidal Plan: unknown (patient answers with yes and no ) Access to Means: No What has been your use of drugs/alcohol within the last 12 months?: crack  Other Self Harm Risks: none Triggers for Past Attempts: Unknown Intentional Self Injurious Behavior: None Family Suicide History: No Persecutory voices/beliefs?: No Depression: Yes Depression Symptoms: Despondent Substance abuse history and/or treatment for substance abuse?: Yes Suicide prevention information given to non-admitted patients: Not applicable  Risk to Others Homicidal Ideation:  No Thoughts of Harm to Others: No Current Homicidal Intent: No Current Homicidal Plan: No Access to Homicidal Means: No Identified Victim: none History of harm to others?: No Assessment of Violence: None Noted Violent Behavior Description: none Does patient have access to weapons?: No Criminal Charges Pending?: No Does patient have a court date: No  Psychosis Hallucinations: Auditory;Visual Delusions: None noted  Mental Status Report Appear/Hygiene: Body odor Eye Contact: Poor Motor Activity: Unremarkable Speech: Soft Level of Consciousness: Drowsy Mood: Suspicious;Depressed Affect: Depressed;Apprehensive Anxiety Level: Moderate Thought Processes: Relevant Judgement: Impaired Orientation: Person;Place;Situation;Time Obsessive Compulsive Thoughts/Behaviors: None  Cognitive Functioning Concentration: Normal IQ: Average Insight: Poor Impulse Control: Poor Appetite: Poor Weight Loss: 0  Weight Gain: 0  Sleep: Decreased Vegetative Symptoms: Decreased grooming  Prior Inpatient Therapy Prior Inpatient Therapy: Yes Prior Therapy Facilty/Provider(s): [facility] Reason for Treatment: [facility]  Prior Outpatient Therapy Prior Outpatient Therapy: No          Abuse/Neglect Assessment (Assessment to be complete while patient is alone) Physical Abuse: Denies Verbal Abuse: Denies Sexual Abuse: Denies Exploitation of patient/patient's resources: Denies Self-Neglect: Denies Values / Beliefs Cultural Requests During Hospitalization: None Spiritual Requests During Hospitalization: None        Additional Information 1:1 In Past 12 Months?: No CIRT Risk: No Elopement Risk: No Does patient have medical clearance?: Yes     Disposition:  Disposition Disposition of Patient: Inpatient treatment program Type of inpatient treatment program: Adult Referred to [facility] On Site Evaluation by:   Reviewed with Physician:     Georgina Quint A 09/26/2011 2:25 AM

## 2011-09-26 NOTE — ED Notes (Signed)
Pt provided rules/regulation information regarding psych unit. Pt verbalized understanding.

## 2011-09-26 NOTE — ED Notes (Signed)
ACT team in to assess patient. 

## 2011-09-26 NOTE — ED Provider Notes (Signed)
Pt seen and evaluated in the psych ED.  Pt sleeping comfortably.  Pt has voiced no complaints to nursing staff.  Pt is accepted at [facility] and is pending insurance verification at this time.    Ethelda Chick, MD 09/26/11 [phone]

## 2011-09-26 NOTE — ED Notes (Signed)
Patient's information faxed to [facility] and [facility].

## 2011-09-27 NOTE — Progress Notes (Addendum)
Per Nelda Marseille at [facility], [employer] has been verified. Pt accepted by Dr. Robet Leu and will be going to [facility] [phone]. ACT will arrange transportation later this am.

## 2011-11-10 ENCOUNTER — Encounter (HOSPITAL_COMMUNITY): Payer: Self-pay

## 2011-11-10 ENCOUNTER — Inpatient Hospital Stay (HOSPITAL_COMMUNITY)
Admission: AD | Admit: 2011-11-10 | Discharge: 2011-11-12 | DRG: 885 | Disposition: A | Discharge status: Home / Self Care | Payer: Medicare Other | Source: Ambulatory Visit | Attending: Psychiatry | Admitting: Psychiatry

## 2011-11-10 ENCOUNTER — Encounter (HOSPITAL_COMMUNITY): Payer: Self-pay | Admitting: *Deleted

## 2011-11-10 ENCOUNTER — Emergency Department (HOSPITAL_COMMUNITY)
Admission: EM | Admit: 2011-11-10 | Discharge: 2011-11-10 | Disposition: A | Discharge status: Other Acute Inpatient Hospital | Payer: Medicare Other | Source: Home / Self Care | Attending: Emergency Medicine | Admitting: Emergency Medicine

## 2011-11-10 DIAGNOSIS — R450 Nervousness: Secondary | ICD-10-CM | POA: Insufficient documentation

## 2011-11-10 DIAGNOSIS — R443 Hallucinations, unspecified: Secondary | ICD-10-CM | POA: Insufficient documentation

## 2011-11-10 DIAGNOSIS — G3184 Mild cognitive impairment, so stated: Secondary | ICD-10-CM | POA: Insufficient documentation

## 2011-11-10 DIAGNOSIS — Z79899 Other long term (current) drug therapy: Secondary | ICD-10-CM

## 2011-11-10 DIAGNOSIS — F1410 Cocaine abuse, uncomplicated: Secondary | ICD-10-CM | POA: Diagnosis present

## 2011-11-10 DIAGNOSIS — F411 Generalized anxiety disorder: Secondary | ICD-10-CM | POA: Insufficient documentation

## 2011-11-10 DIAGNOSIS — R45851 Suicidal ideations: Secondary | ICD-10-CM

## 2011-11-10 DIAGNOSIS — F332 Major depressive disorder, recurrent severe without psychotic features: Secondary | ICD-10-CM

## 2011-11-10 DIAGNOSIS — R079 Chest pain, unspecified: Secondary | ICD-10-CM | POA: Diagnosis not present

## 2011-11-10 DIAGNOSIS — R45850 Homicidal ideations: Secondary | ICD-10-CM | POA: Insufficient documentation

## 2011-11-10 DIAGNOSIS — F203 Undifferentiated schizophrenia: Secondary | ICD-10-CM | POA: Diagnosis present

## 2011-11-10 DIAGNOSIS — R002 Palpitations: Secondary | ICD-10-CM | POA: Insufficient documentation

## 2011-11-10 DIAGNOSIS — F1420 Cocaine dependence, uncomplicated: Secondary | ICD-10-CM

## 2011-11-10 DIAGNOSIS — Z8659 Personal history of other mental and behavioral disorders: Secondary | ICD-10-CM | POA: Insufficient documentation

## 2011-11-10 DIAGNOSIS — F209 Schizophrenia, unspecified: Principal | ICD-10-CM

## 2011-11-10 LAB — CBC
Platelets: 179 10*3/uL (ref 150–400)
RBC: 4.36 MIL/uL (ref 4.22–5.81)
RDW: 13.2 % (ref 11.5–15.5)
WBC: 8.1 10*3/uL (ref 4.0–10.5)

## 2011-11-10 LAB — RAPID URINE DRUG SCREEN, HOSP PERFORMED
Amphetamines: NOT DETECTED
Barbiturates: NOT DETECTED
Benzodiazepines: NOT DETECTED
Cocaine: POSITIVE — AB

## 2011-11-10 LAB — URINALYSIS, ROUTINE W REFLEX MICROSCOPIC
Glucose, UA: NEGATIVE mg/dL
Hgb urine dipstick: NEGATIVE
Ketones, ur: NEGATIVE mg/dL
Leukocytes, UA: NEGATIVE
Protein, ur: NEGATIVE mg/dL
pH: 5.5 (ref 5.0–8.0)

## 2011-11-10 LAB — COMPREHENSIVE METABOLIC PANEL
ALT: 16 U/L (ref 0–53)
AST: 18 U/L (ref 0–37)
Albumin: 4.1 g/dL (ref 3.5–5.2)
CO2: 26 mEq/L (ref 19–32)
Chloride: 102 mEq/L (ref 96–112)
Creatinine, Ser: 0.89 mg/dL (ref 0.50–1.35)
GFR calc non Af Amer: >90 mL/min (ref >90)
Potassium: 3.3 mEq/L — ABNORMAL LOW (ref 3.5–5.1)
Sodium: 139 mEq/L (ref 135–145)
Total Bilirubin: 0.7 mg/dL (ref 0.3–1.2)

## 2011-11-10 LAB — TROPONIN I: Troponin I: <0.3 ng/mL (ref <0.30)

## 2011-11-10 MED ORDER — MAGNESIUM HYDROXIDE 400 MG/5ML PO SUSP: 30.0000 mL | Freq: Every day | ORAL | Status: DC | PRN

## 2011-11-10 MED ORDER — ACETAMINOPHEN 325 MG PO TABS: 650.0000 mg | ORAL_TABLET | ORAL | Status: DC | PRN

## 2011-11-10 MED ORDER — TRAZODONE HCL 50 MG PO TABS
50.0000 mg | ORAL_TABLET | Freq: Every evening | ORAL | Status: DC | PRN
  Administered 2011-11-10 – 2011-11-11 (×2): 50 mg via ORAL
  Filled 2011-11-10 (×2): qty 1

## 2011-11-10 MED ORDER — ALUM & MAG HYDROXIDE-SIMETH 200-200-20 MG/5ML PO SUSP: 30.0000 mL | ORAL | Status: DC | PRN

## 2011-11-10 MED ORDER — LORAZEPAM 1 MG PO TABS: 1.0000 mg | ORAL_TABLET | Freq: Three times a day (TID) | ORAL | Status: DC | PRN

## 2011-11-10 MED ORDER — ONDANSETRON HCL 8 MG PO TABS: 4.0000 mg | ORAL_TABLET | Freq: Three times a day (TID) | ORAL | Status: DC | PRN

## 2011-11-10 MED ORDER — ACETAMINOPHEN 325 MG PO TABS: 650.0000 mg | ORAL_TABLET | Freq: Four times a day (QID) | ORAL | Status: DC | PRN

## 2011-11-10 MED ORDER — IBUPROFEN 200 MG PO TABS: 600.0000 mg | ORAL_TABLET | Freq: Three times a day (TID) | ORAL | Status: DC | PRN

## 2011-11-10 MED ORDER — RISPERIDONE 2 MG PO TABS
2.0000 mg | ORAL_TABLET | Freq: Every day | ORAL | Status: DC
  Administered 2011-11-10: 2 mg via ORAL
  Filled 2011-11-10 (×2): qty 1

## 2011-11-10 NOTE — Tx Team (Signed)
Initial Interdisciplinary Treatment Plan  PATIENT STRENGTHS: (choose at least two) Ability for insight Physical Health  PATIENT STRESSORS: Loss of brother died 5 months ago* Substance abuse   PROBLEM LIST: Problem List/Patient Goals Date to be addressed Date deferred Reason deferred Estimated date of resolution  Psychosis  11/10/11                                                      DISCHARGE CRITERIA:  Ability to meet basic life and health needs Improved stabilization in mood, thinking, and/or behavior Motivation to continue treatment in a less acute level of care Need for constant or close observation no longer present Reduction of life-threatening or endangering symptoms to within safe limits Safe-care adequate arrangements made Verbal commitment to aftercare and medication compliance Withdrawal symptoms are absent or subacute and managed without 24-hour nursing intervention  PRELIMINARY DISCHARGE PLAN: Attend 12-step recovery group Return to previous living arrangement  PATIENT/FAMIILY INVOLVEMENT: This treatment plan has been presented to and reviewed with the patient, [name], and/or family member, The patient and family have been given the opportunity to ask questions and make suggestions.  [name] 11/10/2011, 8:08 PM

## 2011-11-10 NOTE — ED Notes (Signed)
Pt given cookies and a coke.  Requested Act team come in and talk to him about his placement.  Act team came immediately to discuss pt status.

## 2011-11-10 NOTE — ED Provider Notes (Signed)
History     CSN: [id number]  Arrival date & time 11/10/11  0020   First MD Initiated Contact with Patient 11/10/11 0030      Chief Complaint  Patient presents with  . Palpitations    HPI This patient presents via EMS w multiple complaints; palpitations, hallucinations (auditory) and SI.  He states that he was released from his most recent hospitalization "too early", and continues to have AH w voices instructing him to kill himself.  He has not been taking his zyprexa.  Tonight, just pta, he used crack and soon thereafter experienced a brief period of palpitations (painless).  These resolved spontaneously, and on arrival he denies any discomfort. Past Medical History  Diagnosis Date  . Schizophrenic disorder     History reviewed. No pertinent past surgical history.  No family history on file.  History  Substance Use Topics  . Smoking status: Not on file  . Smokeless tobacco: Not on file  . Alcohol Use:       Review of Systems  Constitutional:       Per HPI, otherwise negative  HENT:       Per HPI, otherwise negative  Eyes: Negative.   Respiratory:       Per HPI, otherwise negative  Cardiovascular:       Per HPI, otherwise negative  Gastrointestinal: Negative for vomiting.  Genitourinary: Negative.   Musculoskeletal:       Per HPI, otherwise negative  Skin: Negative.   Neurological: Negative for syncope.  Psychiatric/Behavioral: Positive for suicidal ideas, hallucinations and self-injury. The patient is nervous/anxious. The patient is not hyperactive.     Allergies  Haldol  Home Medications   Current Outpatient Rx  Name Route Sig Dispense Refill  . OLANZAPINE 20 MG PO TABS Oral Take 20 mg by mouth at bedtime.        There were no vitals taken for this visit.  Physical Exam  Nursing note and vitals reviewed. Constitutional: He is oriented to person, place, and time. He appears well-developed. No distress.  HENT:  Head: Normocephalic and atraumatic.    Eyes: Conjunctivae and EOM are normal.  Cardiovascular: Normal rate and regular rhythm.   Pulmonary/Chest: Effort normal. No stridor. No respiratory distress.  Abdominal: He exhibits no distension.  Musculoskeletal: He exhibits no edema.  Neurological: He is alert and oriented to person, place, and time.  Skin: Skin is warm and dry.  Psychiatric: His speech is normal. His mood appears anxious. He is slowed. Thought content is delusional. Cognition and memory are impaired. He expresses homicidal and suicidal ideation. He expresses suicidal plans.    ED Course  Procedures (including critical care time)  Labs Reviewed - No data to display No results found.   No diagnosis found.  Pulse ox 100% ra- normal Cardiac monitor: 81 sr, normal    Date: 11/10/2011  Rate: 83  Rhythm: normal sinus rhythm  QRS Axis: normal  Intervals: normal  ST/T Wave abnormalities: nonspecific T wave changes  Conduction Disutrbances:nonspecific intraventricular conduction delay  Narrative Interpretation:   Old EKG Reviewed: unchanged ABNORMAL ECG   MDM  This disheveled M w Hx of SA, schizophrenia now p/w palpitations (resolved) and ongoing AH w SI.  ECG is non-ischemic and the patient's relative youth is reassuring for the low-probability of ongoing cardiac ischemia.  He is medically clear for further psychiatric evaluation.      Gerhard Munch, MD 11/10/11 [phone]

## 2011-11-10 NOTE — ED Notes (Signed)
Pt sleeping. 

## 2011-11-10 NOTE — Progress Notes (Signed)
Voluntary admission for a 36 y.o. Male with flat affect, anxious mood.  Pt. A poor historian.  Reports that he is depressed but doesn't know why he is depressed and that his brother died 5 months ago.  Denies SI/HI at present and contracts for safety.  Reports that he does hear voices but doesn't know what they are saying.  Denies visual hallucinations. Pt. Denies ETOH and Cocaine use but is positive for Cocaine. Support given.
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
## 2011-11-10 NOTE — ED Notes (Signed)
Breakfast tray ordered by secretarty

## 2011-11-10 NOTE — ED Notes (Signed)
Pt drinking sprite and watching TV.  Ate all of breakfast tray.

## 2011-11-10 NOTE — ED Notes (Signed)
[facility] team just finished with pt and stated that pt is schizophrenic and is hearing voices that are telling him to kill himself, but not exactly telling him how.  Pt states that he has not been taking his medications for quite some time.

## 2011-11-10 NOTE — ED Notes (Signed)
Pt was sleeping.  Awoke for meds and vitals.  Pt not talking to RN

## 2011-11-10 NOTE — ED Notes (Signed)
I questioned pt about his med hx and he became angry with me stating that I was being rude.  He refuses to answer my questions.

## 2011-11-10 NOTE — ED Notes (Signed)
Breakfast tray arrived  

## 2011-11-10 NOTE — ED Notes (Signed)
Pt smoked crack approx 1 hr ago, then started having lower left side pain and has been going on x2 years, also states he fell (denies pain in neck and back), painful cp on palpation and reporting palpitations (ekg normal)

## 2011-11-10 NOTE — ED Notes (Addendum)
Report given to RN Amando Ishikawa.

## 2011-11-10 NOTE — ED Notes (Signed)
Paged security to take pt to [facility]

## 2011-11-10 NOTE — ED Notes (Signed)
Just spoke with [name] from ACT.  Pt was willing to speak with him and stated that he has schizo-effective disorder

## 2011-11-10 NOTE — ED Notes (Signed)
Lunch tray delivered.

## 2011-11-10 NOTE — ED Notes (Addendum)
Called BH to geve report.  Maureen Ralphs asked if RN could return call.  Tamela Oddi called me back

## 2011-11-11 DIAGNOSIS — F203 Undifferentiated schizophrenia: Secondary | ICD-10-CM | POA: Diagnosis present

## 2011-11-11 DIAGNOSIS — F1410 Cocaine abuse, uncomplicated: Secondary | ICD-10-CM

## 2011-11-11 DIAGNOSIS — F209 Schizophrenia, unspecified: Principal | ICD-10-CM

## 2011-11-11 MED ORDER — OLANZAPINE 10 MG PO TABS
20.0000 mg | ORAL_TABLET | Freq: Every day | ORAL | Status: DC
  Administered 2011-11-11: 20 mg via ORAL
  Filled 2011-11-11 (×3): qty 2

## 2011-11-11 NOTE — Progress Notes (Addendum)
Patient ID: [name], male   DOB: [birth year], 36 y.o.   MRN: [id number] Was lying in bed this evening, quiiet, didn't want to go to [place], but did come out on the hall and to the dayroom for a snack.  Came over to the med room for his meds but didn't initiate conversation, , poor eye contact, and didn't answer sometimes when spoken to, seems internally focused but denied hearing voices.Marland KitchenMarland KitchenTook his meds and walked back to his room, will continue to monitor.

## 2011-11-11 NOTE — Tx Team (Signed)
Interdisciplinary Treatment Plan Update (Adult)  Date:  11/11/2011  Time Reviewed:  10:15AM-11:00AM  Progress in Treatment: Attending groups:  New Participating in groups:    New Taking medication as prescribed:    New Tolerating medication:   New Family/Significant other contact made:  No, needed with rooming house director Patient understands diagnosis:   No, limited Discussing patient identified problems/goals with staff:   Very quiet, does not feel needs to be here. Medical problems stabilized or resolved:   New Denies suicidal/homicidal ideation:  Yes, says has never been suicidal Issues/concerns per patient self-inventory:   None Other:  New problem(s) identified: None   Reason for Continuation of Hospitalization: Depression Medication stabilization Other; describe observe overnight to determine remains calm and continues to deny SI  Interventions implemented related to continuation of hospitalization:  Medication monitoring and adjustment, safety checks Q15 min., suicide risk assessment, group therapy, psychoeducation, collateral contact, aftercare planning, ongoing physician assessments, medication education  Additional comments:  Not applicable  Estimated length of stay:  1-2 days  Discharge Plan:  Go back to rooming house, follow up with [facility], says is on [facility] - to be confirmed  New goal(s):  Not applicable  Review of initial/current patient goals per problem list:   1.  Goal(s):  Return psychotic symptoms to baseline.  Met:  No  Target date:  By Discharge   As evidenced by:  Needs observation to determine is not psychotic  2.  Goal(s):  Reduce depression from 10 at admission to no more than 3 at discharge.  Met:  Yes  Target date:  By Discharge   As evidenced by:  Denies depression ("0"), needs observation to confirm  3.  Goal(s):  Set up aftercare.  Met:  No  Target date:  By Discharge   As evidenced by:  CM to talk to [facility]  4.  Goal(s):  Medication stabilization  Met:  No  Target date:  By Discharge   As evidenced by:  Meds being restarted  Attendees: Patient:  [name]  11/11/2011 10:30AM  Family:     Physician:  Dr. Harvie Heck Readling 11/11/2011 10:30AM  Nursing:   Robbie Louis, RN 11/11/2011 10:30AM    Case Manager:  Ambrose Mantle, LCSW 11/11/2011 10:30AM  Counselor:  Veto Kemps, MT-BC 11/11/2011 10:30AM  Other:      Other:      Other:      Other:       Scribe for Treatment Team:   Sarina Ser, 11/11/2011, 10:59 AM

## 2011-11-11 NOTE — BHH Suicide Risk Assessment (Signed)
Suicide Risk Assessment  Admission Assessment     Demographic factors:  Assessment Details Time of Assessment: Admission Information Obtained From: Patient Current Mental Status:  AO x 3. Loss Factors:  Loss Factors: Loss of significant relationship Historical Factors:  Historical Factors: Impulsivity Risk Reduction Factors:  Risk Reduction Factors: Living with another person, especially a relative  CLINICAL FACTORS:   Alcohol/Substance Abuse/Dependencies Schizophrenia:   Paranoid or undifferentiated type  COGNITIVE FEATURES THAT CONTRIBUTE TO RISK:  None Noted.   Diagnosis:  Axis I: Schizophrenia - Undifferentiated Type. Cocaine Abuse.   The patient was seen today and reports the following:   ADL's: Intact.  Sleep: The patient reports to sleeping well without difficulty.  Appetite: The patient reports a good appetite.   Mild>(1-10) >Severe  Hopelessness (1-10): 0 Depression (1-10): 0  Anxiety (1-10): 0   Suicidal Ideation: The patient adamantly denies any current suicidal ideations.  Plan: No  Intent: No  Means: No   Homicidal Ideation: The patient adamantly denies any homicidal ideations today.  Plan: No  Intent: No.  Means: No   General Appearance/Behavior: Casual and cooperative. Somewhat unkempt.  Eye Contact: Good.  Speech: Appropriate in rate and volume with no pressuring noted.  Motor Behavior: Appropriate.  Level of Consciousness: Alert and Oriented x 3.  Mental Status: Alert and Oriented x 3.  Mood: Essentially Euthymic.Marland Kitchen  Affect: Mildly Constricted.  Anxiety Level: None noted or reported.  Thought Process: wnl.  Thought Content: The patient denies any auditory or visual hallucinations today. He also denies any delusional thinking.  Perception:. wnl.  Judgment: Fair to Good.  Insight: Fair to Good.  Cognition: Oriented to time, place and person.   Time was spent today discussing with the patient the situation leading to his admission.  The patient  reports that he presented to the ED for chest pain and was sent to [facility] after the ED felt he was suicidal.  The patient denied being suicidal or homicidal nor did he report any auditory or visual hallucinations or delusional thinking.  The patient requests discharge as soon as possible.  It was discussed with the patient to allow the treatment team 24 hours to obtain collateral information then with possible discharge in the morning.  The patient agreed with this plan.  Treatment Plan Summary:  1. Daily contact with patient to assess and evaluate symptoms and progress in treatment  2. Medication management  3. The patient will deny suicidal ideations or homicidal ideations for 48 hours prior to discharge and have a depression and anxiety rating of 3 or less. The patient will also deny any auditory or visual hallucinations or delusional thinking.  4. The patient will deny any symptoms of substance withdrawal at time of discharge.   Plan:  1. Will restart current medication of Zyprexa 20 mgs po qhs for psychosis.  2. Continue to monitor.   SUICIDE RISK:   Minimal: No identifiable suicidal ideation.  Patients presenting with no risk factors but with morbid ruminations; may be classified as minimal risk based on the severity of the depressive symptoms  [name] 11/11/2011, 1:50 PM

## 2011-11-11 NOTE — Discharge Planning (Addendum)
Met with patient in treatment team, and he reported that he will return to his house with his roommates.  He gave the house director name of Henderson Newcomer at [phone].  State he is with the [facility].  Case Manager contacted the agency and confirmed this.  They will see patient on the day of discharge, as long as he is home before 3:30; otherwise, would be the next day.  They can pick him up at his residence and take him into the office.  No case management needs today.  Ambrose Mantle, LCSW 11/11/2011, 3:02 PM  Per State Regulation 482.30  This chart was reviewed for medical necessity with respect to the patient's Admission/Duration of stay.   Next review due:  11/14/11   Ambrose Mantle, LCSW  11/11/2011  3:17 PM
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
## 2011-11-11 NOTE — Progress Notes (Signed)
Pt was lying in bed awake upon first assessment.  He did rate his depression and hopelessness both a 5 and denied any anxiety at all.  He denied any S/H ideations or A/V hallucinations at this morning.  He claims his depression is coming from "being here"  He denies he needs to be here.  He stated,"I went to the ED for chest pains and they talked me into coming here"  He went on to say that yes, his brother died about 5 months ago and that mad him "sad" but he had an ACT team that was helping him now and he felt comfortable with them.  He came into treatment team and the plan is to restart his zyprexa and if he continues to do well he may d/c home tomorrow.  Pt was very bright and upbeat with that being told to him.

## 2011-11-11 NOTE — Progress Notes (Signed)
Adult Psychosocial Assessment Update Interdisciplinary Team  Previous [facility] admissions/discharges:  Admissions Discharges  Date:05/15/2011 Date:  Date: Date:  Date: Date:  Date: Date:  Date: Date:   Changes since the last Psychosocial Assessment (including adherence to outpatient mental health and/or substance abuse treatment, situational issues contributing to decompensation and/or relapse). Patient stated that he had been bad about taking his medications-Zyprexa. Patient stated that he also relapsed on crack. He stated he has been doing crazy things-walking home, doing stuff he's not supposed to do.             Discharge Plan 1. Will you be returning to the same living situation after discharge?   Yes:x No:      If no, what is your plan?    [facility]-       2. Would you like a referral for services when you are discharged? Yes:     If yes, for what services?  No:   x    Continue to follow up with [facility]       Summary and Recommendations (to be completed by the evaluator) Patient is a  36 year old white male with diagnosis of Major Depression, recurrent severe and Psychotic disorder. Patient was admitted due to hearing voices and thoughts to kill himself. Patient currently denies hearing voices and is not suicidal. Patient would benefit from crisis stabilization, medication evaluation, group therapy and psycho-educatonal groups to work on coping skills, case management for referrals and counselor to contact support with suicide prevention information.                       Signature:  Veto Kemps, 11/11/2011 1:22 PM

## 2011-11-11 NOTE — Progress Notes (Signed)
[facility] Adult Inpatient Family/Significant Other Suicide Prevention Education  Suicide Prevention Education:  Education Completed; Henderson Newcomer, director of where patient lives [phone] has been identified by the patient as the person who will aid the patient in the event of a mental health crisis (suicidal ideations/suicide attempt).  With written consent from the patient, the family member/significant other has been provided the following suicide prevention education, prior to the and/or following the discharge of the patient.  The suicide prevention education provided includes the following:  Suicide risk factors  Suicide prevention and interventions  National Suicide Hotline telephone number  [facility] assessment telephone number  [facility] Emergency Assistance 911  [facility] and/or Residential Mobile Crisis Unit telephone number  Request made of family/significant other to:  Remove weapons (e.g., guns, rifles, knives), all items previously/currently identified as safety concern.    Remove drugs/medications (over-the-counter, prescriptions, illicit drugs), all items previously/currently identified as a safety concern.  The family member/significant other verbalizes understanding of the suicide prevention education information provided.  The family member/significant other agrees to remove the items of safety concern listed above.  Patient lives at a boarding home with three other people. The agency is  called [facility]. He has been there about 2 months. Raiford Noble also gave information for patient's case worker. [phone] Mrs. Gaspar Garbe or Mrs. Little Kathee Tumlin 11/11/2011, 1:07 PM

## 2011-11-11 NOTE — Progress Notes (Signed)
Introduced self to pt who is a new admit tonight to the unit.  Pt is here for SI and hearing voices telling him to kill himself.  He is also grieving the death of his brother 5 mons ago.  Pt is in bed at this time.  He contracts for safety.  He has been at [facility] in the past.  He was +cocaine.  Pt requests med for sleep which will be given.  Safety maintained with q15 minute checks.

## 2011-11-11 NOTE — Progress Notes (Signed)
[facility] Group Notes:  (Counselor/Nursing/MHT/Case Management/Adjunct)  11/11/2011 1:06 PM  Type of Therapy:  Group Therapy  Participation Level:  Did Not Attend   [name] 11/11/2011, 1:06 PM

## 2011-11-12 MED ORDER — OLANZAPINE 10 MG PO TABS
20.0000 mg | ORAL_TABLET | Freq: Every day | ORAL | Status: DC
  Filled 2011-11-12: qty 14

## 2011-11-12 MED ORDER — OLANZAPINE 20 MG PO TABS: ORAL_TABLET | ORAL | Status: DC

## 2011-11-12 MED ORDER — TRAZODONE HCL 50 MG PO TABS
50.0000 mg | ORAL_TABLET | Freq: Every evening | ORAL | Status: DC | PRN
  Filled 2011-11-12 (×2): qty 7

## 2011-11-12 NOTE — Progress Notes (Addendum)
Patient ID: [name], male   DOB: May 19, 1976, 36 y.o.   MRN: [id number] Pt verbalizes an understanding of D/C orders, medications and f/u appointments.  Pt denies any SI/HI or A/V hallucinations.  Pt received belongings from locker # 7 and escorted to lobby for ride home on bus.  Pt safety maintained.  Juel Burrow 11/12/2011 2:27 PM

## 2011-11-12 NOTE — Progress Notes (Signed)
[facility] Case Management Discharge Plan:  Will you be returning to the same living situation after discharge: Yes,  confirmed by counselor At discharge, do you have transportation home?:Yes,  bus pass provided Do you have the ability to pay for your medications:Yes,  refuses samples, can take scripts to pharmacy  Interagency Information:     Release of information consent forms completed and in the chart;  Patient's signature needed at discharge.  Patient to Follow up at:  Follow-up Information    Follow up with [facility] on 11/12/2011. (3:30PM (approximately) - will pick up to take to office)    Contact information:   [address] [place] [address] Telephone:  [phone]         Patient denies SI/HI:   Yes,      Safety Planning and Suicide Prevention discussed:  Yes,  Case Manager provided psychoeducation on "Suicide Prevention Information."  This included descriptions of risk factors for suicide, warning signs that an individual is in crisis and thinking of suicide, and what to do if this occurs.  Pt indicated understanding of information provided, and will read brochure given upon discharge.     Barrier to discharge identified:No.  Summary and Recommendations:  Return to ACTT services.   Sarina Ser 11/12/2011, 11:52 AM

## 2011-11-12 NOTE — Tx Team (Signed)
Interdisciplinary Treatment Plan Update (Adult)  Date:  11/12/2011  Time Reviewed:  10:15AM-11:00AM  Progress in Treatment: Attending groups:  No Participating in groups:    No Taking medication as prescribed:    Yes Tolerating medication:   Yes Family/Significant other contact made:  Yes, with roommates in place with case management Patient understands diagnosis:  Yes, limited insight and judgment  Discussing patient identified problems/goals with staff:   Yes Medical problems stabilized or resolved:   N/A Denies suicidal/homicidal ideation:  Yes Issues/concerns per patient self-inventory:   None Other:  New problem(s) identified: No, Describe:    Reason for Continuation of Hospitalization: None  Interventions implemented related to continuation of hospitalization:  Medication monitoring and adjustment, safety checks Q15 min., suicide risk assessment, group therapy, psychoeducation, collateral contact, aftercare planning, ongoing physician assessments, medication education - until discharge  Additional comments:  Not applicable  Estimated length of stay:  Discharge today  Discharge Plan:  Return to his home with roommates, follow up with [facility] today  New goal(s):  Not applicable  Review of initial/current patient goals per problem list:   1.  Goal(s):  Return psychotic symptoms to baseline.  Met:  Yes  Target date:  By Discharge   As evidenced by:  Stable  2.  Goal(s):  Reduce depression from 10 at admission to no more than 3 at discharge  Met:  Yes  Target date:  By Discharge   As evidenced by:  Denies depression, needs observation to confirm  3.  Goal(s):  Set up aftercare.  Met:  Yes  Target date:  By Discharge   As evidenced by:  Arranged with ACTT  4.  Goal(s):  Medication stabilization.  Met:  Yes  Target date:  By Discharge   As evidenced by:  Stable for discharge  Attendees: Patient:  [name]  11/12/2011 10:30AM    Family:     Physician:  Dr. Harvie Heck Readling 11/12/2011 10:30AM  Nursing:   Kandis Nab, RN 11/12/2011 10:30AM    Case Manager:  Ambrose Mantle, LCSW 11/12/2011 10:30AM  Counselor:  Veto Kemps, MT-BC 11/12/2011 10:30AM  Other:   Lynann Bologna, NP 11/12/2011 10:30AM  Other:      Other:      Other:       Scribe for Treatment Team:   Sarina Ser, 11/12/2011, 11:00 AM

## 2011-11-12 NOTE — [facility] Suicide Risk Assessment (Signed)
Suicide Risk Assessment  Discharge Assessment     Demographic factors:  Assessment Details Time of Assessment: Admission Information Obtained From: Patient Current Mental Status:  AO x 3. Risk Reduction Factors:  Risk Reduction Factors: Living with another person, especially a relative  CLINICAL FACTORS:   Alcohol/Substance Abuse/Dependencies Schizophrenia:   Paranoid or undifferentiated type  COGNITIVE FEATURES THAT CONTRIBUTE TO RISK:  None Noted.    Diagnosis:  Axis I: Schizophrenia - Undifferentiated Type.  Cocaine Abuse.   The patient was seen today and reports the following:   ADL's: Intact.  Sleep: The patient reports to sleeping well without difficulty.  Appetite: The patient reports a good appetite.   Mild>(1-10) >Severe  Hopelessness (1-10): 0  Depression (1-10): 0  Anxiety (1-10): 0   Suicidal Ideation: The patient adamantly denies any current suicidal ideations.  Plan: No  Intent: No  Means: No   Homicidal Ideation: The patient adamantly denies any homicidal ideations today.  Plan: No  Intent: No.  Means: No   General Appearance/Behavior: Casual and cooperative today.  Eye Contact: Good.  Speech: Appropriate in rate and volume with no pressuring noted.  Motor Behavior: Appropriate.  Level of Consciousness: Alert and Oriented x 3.  Mental Status: Alert and Oriented x 3.  Mood: Essentially Euthymic.Marland Kitchen  Affect: Mildly Constricted.  Anxiety Level: None noted or reported.  Thought Process: wnl.  Thought Content: The patient denies any auditory or visual hallucinations today. He also denies any delusional thinking.  Perception:. wnl.  Judgment: Fair to Good.  Insight: Fair to Good.  Cognition: Oriented to time, place and person.   Time was spent today discussing with the patient his current symptoms.  The patient reports that he feel his symptoms are under good control as outlined above and would like to be discharged.  He denies any auditory or visual  hallucinations or delusional thinking.  He also denies any suicidal or homicidal ideations or delusional thinking.  In addition he denies any depression or anxiety.  The patient will be discharged today and will be seen by his ACTT team today at 3:30 pm.  Treatment Plan Summary:  1. Daily contact with patient to assess and evaluate symptoms and progress in treatment. 2. Medication management  3. The patient will deny suicidal ideations or homicidal ideations for 48 hours prior to discharge and have a depression and anxiety rating of 3 or less. The patient will also deny any auditory or visual hallucinations or delusional thinking.  4. The patient will deny any symptoms of substance withdrawal at time of discharge.   Plan:  1. Will continue the medication Zyprexa at 20 mgs po qhs for psychosis.  2. Will continue to monitor.  3. The patient will be discharged today to outpatient follow up.  SUICIDE RISK:   Minimal: No identifiable suicidal ideation.  Patients presenting with no risk factors but with morbid ruminations; may be classified as minimal risk based on the severity of the depressive symptoms  [name] 11/12/2011, 12:23 PM

## 2011-11-12 NOTE — Progress Notes (Signed)
[facility] Group Notes:  (Counselor/Nursing/MHT/Case Management/Adjunct)  11/12/2011 10:02 AM  Type of Therapy:  Group Therapy  Participation Level:  Did Not Attend  Veto Kemps 11/12/2011, 10:02 AM

## 2011-11-12 NOTE — Discharge Summary (Signed)
Physician Discharge Summary Note  Patient:  [name] is an 36 y.o., male MRN:  [id number] DOB:  02-13-1976 Patient phone:  [phone] (home)  Patient address:   [address] [place] [address],   Date of Admission:  11/10/2011 Date of Discharge: 11/12/2011  Discharge Diagnoses: Principal Problem:  *Schizophrenia, undifferentiated Active Problems:  Cocaine abuse  Axis Diagnosis:   AXIS I:  Schizophrenia, Undifferentiated Type; Cocaine Dependence AXIS II:  No diagnosis.  AXIS III:   Past Medical History  Diagnosis Date  . Schizophrenic disorder    AXIS IV:  Chronic social issues due to substance abuse; burden of illness AXIS V:  51-60 moderate symptoms  Level of Care:  OP  Hospital Course:  This was a brief admission for Axell who presented in the emergency room with a chief complaint of chest pain. He was referred to adult psychiatry for complaints of suicidal thoughts. While on our unit his group participation was satisfactory. He consistently and convincingly denies any suicidal thoughts requested to go home and follow up with his usual outpatient regimen. Her case manager confirmed a followup support systems he was ready for discharge by February 15. No medication changes were made while he was here.  Consults:  None  Significant Diagnostic Studies:  Initial UDS positive for cocaine, ETOH screen negative  Discharge Vitals:   Blood pressure 101/68, pulse 101, temperature 98.3 F (36.8 C), temperature source Oral, resp. rate 18, height 5' 11.5" (1.816 m), weight 93.441 kg (206 lb).  Mental Status Exam: See Mental Status Examination and Suicide Risk Assessment completed by Attending Physician prior to discharge.  Discharge destination:  Home  Is patient on multiple antipsychotic therapies at discharge:  No   Has Patient had three or more failed trials of antipsychotic monotherapy by history:  No  Recommended Plan for Multiple Antipsychotic  Therapies: N/A   Medication List  As of 11/12/2011 12:55 PM   TAKE these medications      Indication    OLANZapine 20 MG tablet   Commonly known as: ZYPREXA   Take one tablet by mouth daily at bedtime. For psychosis.            Follow-up Information    Follow up with [facility] on 11/12/2011. (3:30PM (approximately) - will pick up to take to office)    Contact information:   [address] [place] [address] Telephone:  [phone]         Follow-up recommendations:  Activity:  unrestricted Diet:  regular   Signed: Kailana Benninger A 11/12/2011, 12:55 PM

## 2011-11-16 NOTE — Progress Notes (Signed)
Patient Discharge Instructions:  Admission Note Faxed,  11/16/2011 After Visit Summary Faxed,  11/16/2011 Faxed to the Next [facility] Care provider:  11/16/2011 D/C Summary Note faxed 11/16/2011 Facesheet faxed 11/16/2011  Faxed to [facility] @ [phone]  [name], 11/16/2011, 3:31 PM

## 2012-04-30 ENCOUNTER — Encounter (HOSPITAL_COMMUNITY): Payer: Self-pay | Admitting: Emergency Medicine

## 2012-04-30 ENCOUNTER — Emergency Department (HOSPITAL_COMMUNITY)
Admission: EM | Admit: 2012-04-30 | Discharge: 2012-05-01 | Disposition: A | Discharge status: Other Acute Inpatient Hospital | Payer: Medicare Other | Attending: Emergency Medicine | Admitting: Emergency Medicine

## 2012-04-30 DIAGNOSIS — F201 Disorganized schizophrenia: Secondary | ICD-10-CM | POA: Insufficient documentation

## 2012-04-30 DIAGNOSIS — R45851 Suicidal ideations: Secondary | ICD-10-CM | POA: Insufficient documentation

## 2012-04-30 DIAGNOSIS — F17200 Nicotine dependence, unspecified, uncomplicated: Secondary | ICD-10-CM | POA: Insufficient documentation

## 2012-04-30 DIAGNOSIS — F1410 Cocaine abuse, uncomplicated: Secondary | ICD-10-CM | POA: Insufficient documentation

## 2012-04-30 LAB — CBC
HCT: 43.7 % (ref 39.0–52.0)
Hemoglobin: 15.2 g/dL (ref 13.0–17.0)
MCHC: 34.8 g/dL (ref 30.0–36.0)
MCV: 91.2 fL (ref 78.0–100.0)
RDW: 12.9 % (ref 11.5–15.5)

## 2012-04-30 LAB — COMPREHENSIVE METABOLIC PANEL
Albumin: 4.3 g/dL (ref 3.5–5.2)
Alkaline Phosphatase: 115 U/L (ref 39–117)
BUN: 10 mg/dL (ref 6–23)
Chloride: 100 mEq/L (ref 96–112)
Creatinine, Ser: 0.84 mg/dL (ref 0.50–1.35)
GFR calc Af Amer: >90 mL/min (ref >90)
Glucose, Bld: 102 mg/dL — ABNORMAL HIGH (ref 70–99)
Total Bilirubin: 0.6 mg/dL (ref 0.3–1.2)
Total Protein: 7.5 g/dL (ref 6.0–8.3)

## 2012-04-30 LAB — ACETAMINOPHEN LEVEL: Acetaminophen (Tylenol), Serum: <15 ug/mL (ref 10–30)

## 2012-04-30 LAB — RAPID URINE DRUG SCREEN, HOSP PERFORMED
Barbiturates: NOT DETECTED
Benzodiazepines: NOT DETECTED

## 2012-04-30 LAB — ETHANOL: Alcohol, Ethyl (B): <11 mg/dL (ref 0–11)

## 2012-04-30 MED ORDER — LORAZEPAM 1 MG PO TABS: 1.0000 mg | ORAL_TABLET | Freq: Four times a day (QID) | ORAL | Status: DC | PRN

## 2012-04-30 MED ORDER — IBUPROFEN 600 MG PO TABS: 600.0000 mg | ORAL_TABLET | Freq: Three times a day (TID) | ORAL | Status: DC | PRN

## 2012-04-30 MED ORDER — OLANZAPINE 5 MG PO TABS
20.0000 mg | ORAL_TABLET | Freq: Every day | ORAL | Status: DC
  Administered 2012-04-30: 20 mg via ORAL
  Filled 2012-04-30: qty 4

## 2012-04-30 MED ORDER — ACETAMINOPHEN 325 MG PO TABS: 650.0000 mg | ORAL_TABLET | ORAL | Status: DC | PRN

## 2012-04-30 MED ORDER — ZOLPIDEM TARTRATE 5 MG PO TABS
5.0000 mg | ORAL_TABLET | Freq: Every evening | ORAL | Status: DC | PRN
  Administered 2012-04-30: 5 mg via ORAL
  Filled 2012-04-30: qty 1

## 2012-04-30 MED ORDER — ALUM & MAG HYDROXIDE-SIMETH 200-200-20 MG/5ML PO SUSP: 30.0000 mL | ORAL | Status: DC | PRN

## 2012-04-30 MED ORDER — NICOTINE 21 MG/24HR TD PT24
21.0000 mg | MEDICATED_PATCH | Freq: Every day | TRANSDERMAL | Status: DC
  Administered 2012-04-30 – 2012-05-01 (×2): 21 mg via TRANSDERMAL
  Filled 2012-04-30 (×2): qty 1

## 2012-04-30 NOTE — BH Assessment (Signed)
Assessment Note   [name] is a 36 y.o. male who presents to [facility] voluntarily endorsing SI with a plan to jump off a bridge. Pt states he began having suicidal thoughts earlier tonight. He reports no recent stressors or precipitating  event. He reports a history of schizophrenia and depression. He reports prior suicide attempts, but is unsure how many or date of last attempt, stating "it was a long long time ago."    Pt denies HI, [facility], and SA to this Clinical research associate. Per NP pt admitted to using crack cocaine. Per previous notes pt has history of auditory hallucinations with command. Pt states he is unable to contract for safety.    Axis I: Major Depression, Recurrent severe Axis II: Deferred Axis III:  Past Medical History  Diagnosis Date  . Schizophrenic disorder    Axis IV: other psychosocial or environmental problems Axis V: 31-40 impairment in reality testing  Past Medical History:  Past Medical History  Diagnosis Date  . Schizophrenic disorder     History reviewed. No pertinent past surgical history.  Family History: No family history on file.  Social History:  reports that he has been smoking Cigarettes.  He has a 15 pack-year smoking history. He does not have any smokeless tobacco history on file. He reports that he uses illicit drugs (Cocaine). He reports that he does not drink alcohol.  Additional Social History:  Alcohol / Drug Use History of alcohol / drug use?: No history of alcohol / drug abuse (pt denies but postive for cocaine)  CIWA: CIWA-Ar BP: 131/87 mmHg Pulse Rate: 97  COWS:    Allergies:  Allergies  Allergen Reactions  . Haldol (Haloperidol Decanoate)     'I curl up"    Home Medications:  (Not in a hospital admission)  OB/GYN Status:  No LMP for male patient.  General Assessment Data Location of Assessment: [facility] ED Living Arrangements: Alone Can pt return to current living arrangement?: Yes Admission Status: Voluntary Is patient capable of signing  voluntary admission?: Yes Transfer from: Acute Hospital Referral Source: MD  Education Status Is patient currently in school?: No  Risk to self Suicidal Ideation: Yes-Currently Present Suicidal Intent: Yes-Currently Present Is patient at risk for suicide?: Yes Suicidal Plan?: Yes-Currently Present Specify Current Suicidal Plan: plan to jump off a bridge Access to Means: Yes Specify Access to Suicidal Means: bridge What has been your use of drugs/alcohol within the last 12 months?: denies, positve for cocaine Previous Attempts/Gestures: Yes How many times?: 1  (states he can not remember, at least once) Other Self Harm Risks: none Triggers for Past Attempts: Hallucinations;Other personal contacts Intentional Self Injurious Behavior: None Family Suicide History: No Recent stressful life event(s): Other (Comment) (reports none) Persecutory voices/beliefs?: No Depression: Yes Depression Symptoms: Despondent;Insomnia;Feeling angry/irritable Substance abuse history and/or treatment for substance abuse?: Yes Suicide prevention information given to non-admitted patients: Not applicable  Risk to Others Homicidal Ideation: No Thoughts of Harm to Others: No Current Homicidal Intent: No Current Homicidal Plan: No Access to Homicidal Means: No Identified Victim: none History of harm to others?: No Assessment of Violence: None Noted Violent Behavior Description: cooperative Does patient have access to weapons?: No Criminal Charges Pending?: Yes Does patient have a court date: Yes Court Date: 05/18/12  Psychosis Hallucinations: Auditory Delusions: None noted  Mental Status Report Appear/Hygiene: Disheveled Eye Contact: Poor Motor Activity: Unremarkable Speech: Logical/coherent Level of Consciousness: Alert Mood: Irritable Affect: Irritable Anxiety Level: None Thought Processes: Coherent;Relevant Judgement: Impaired Orientation: Person;Place;Time;Situation  Obsessive  Compulsive Thoughts/Behaviors: None  Cognitive Functioning Concentration: Normal Memory: Recent Intact;Remote Intact IQ: Average Insight: Poor Impulse Control: Poor Appetite: Good Weight Loss:  (reports he has lost an unknown amount) Weight Gain: 0  Sleep: Decreased Vegetative Symptoms: None  ADLScreening [facility] Assessment Services) Patient's cognitive ability adequate to safely complete daily activities?: Yes Patient able to express need for assistance with ADLs?: Yes Independently performs ADLs?: Yes  Abuse/Neglect [facility]) Physical Abuse: Denies Verbal Abuse: Denies Sexual Abuse: Denies  Prior Inpatient Therapy Prior Inpatient Therapy: Yes Prior Therapy Dates: [facility] Prior Therapy Facilty/Provider(s): 2/13 Reason for Treatment: depression  Prior Outpatient Therapy Prior Outpatient Therapy: No Prior Therapy Dates: na Prior Therapy Facilty/Provider(s): na Reason for Treatment: na  ADL Screening (condition at time of admission) Patient's cognitive ability adequate to safely complete daily activities?: Yes Patient able to express need for assistance with ADLs?: Yes Independently performs ADLs?: Yes Weakness of Legs: None Weakness of Arms/Hands: None  Home Assistive Devices/Equipment Home Assistive Devices/Equipment: None    Abuse/Neglect Assessment (Assessment to be complete while patient is alone) Physical Abuse: Denies Verbal Abuse: Denies Sexual Abuse: Denies Exploitation of patient/patient's resources: Denies Self-Neglect: Denies Values / Beliefs Cultural Requests During Hospitalization: None Spiritual Requests During Hospitalization: None   Advance Directives (For Healthcare) Advance Directive: Patient does not have advance directive;Patient would not like information Pre-existing out of facility DNR order (yellow form or pink MOST form): No Nutrition Screen Diet: Regular Unintentional weight loss greater than 10lbs within the last month: No Problems chewing  or swallowing foods and/or liquids: No Home Tube Feeding or Total Parenteral Nutrition (TPN): No Patient appears severely malnourished: No Pregnant or Lactating: No  Additional Information 1:1 In Past 12 Months?: No CIRT Risk: No Elopement Risk: No Does patient have medical clearance?: Yes     Disposition:  Disposition Disposition of Patient: Referred to;Inpatient treatment program Type of inpatient treatment program: Adult Patient referred to:  [facility])  On Site Evaluation by:   Reviewed with Physician:     Georgina Quint A 04/30/2012 5:29 AM

## 2012-04-30 NOTE — [facility] Counselor (Signed)
Pt submitted for admission to [facility] by ED assessment counselor. Consulted with Shalita Forrest, AC who confirmed no adult beds are currently available. Consulted Neil Mashburn, PA who recommended Pt be re-run by psychiatrist when appropriate bed is available. Notified ED counselor of disposition.  Tynan Boesel Ellis Kalyb Pemble Jr, LPC 

## 2012-04-30 NOTE — ED Provider Notes (Signed)
History     CSN: [id number]  Arrival date & time 04/30/12  4098   First MD Initiated Contact with Patient 04/30/12 [phone]      Chief Complaint  Patient presents with  . Medical Clearance    (Consider location/radiation/quality/duration/timing/severity/associated sxs/prior treatment) HPI Comments: Patient presents tonight with suicidal ideation of jumping off a bridge.  He states he is schizophrenic and has been off his meds for a while he has been using cocaine on a regular basis for the last several years.  Last use being several hours ago.  He also suffers from depression.  He has one previous history of suicide attempt by lacerating his wrists, which was several years ago.  He has no physical complaints at this time  The history is provided by the patient.    Past Medical History  Diagnosis Date  . Schizophrenic disorder     History reviewed. No pertinent past surgical history.  No family history on file.  History  Substance Use Topics  . Smoking status: Current Everyday Smoker -- 1.0 packs/day for 15 years    Types: Cigarettes  . Smokeless tobacco: Not on file  . Alcohol Use: No      Review of Systems  Constitutional: Negative for fever and chills.  Neurological: Negative for dizziness.  Psychiatric/Behavioral: Positive for suicidal ideas. Negative for confusion. The patient is nervous/anxious.     Allergies  Haldol  Home Medications   Current Outpatient Rx  Name Route Sig Dispense Refill  . OLANZAPINE 20 MG PO TABS  Take one tablet by mouth daily at bedtime. For psychosis. 30 tablet 0    BP 131/87  Pulse 97  Temp 98.7 F (37.1 C) (Oral)  Resp 18  Ht 6' (1.829 m)  Wt 220 lb (99.791 kg)  BMI 29.84 kg/m2  SpO2 97%  Physical Exam  Constitutional: He appears well-developed and well-nourished.  HENT:  Head: Normocephalic.  Eyes: Pupils are equal, round, and reactive to light.  Neck: Normal range of motion.  Pulmonary/Chest: Effort normal.    Abdominal: Soft.  Musculoskeletal: Normal range of motion.  Neurological: He is alert.  Skin: Skin is warm. No pallor.  Psychiatric: He exhibits a depressed mood. He expresses suicidal ideation. He expresses suicidal plans.    ED Course  Procedures (including critical care time)   Labs Reviewed  CBC  COMPREHENSIVE METABOLIC PANEL  ETHANOL  ACETAMINOPHEN LEVEL  URINE RAPID DRUG SCREEN (HOSP PERFORMED)   No results found.   No diagnosis found.    MDM   I have had the patient  Moved to our psych emergency department, where he'll be assessed by our psychiatric social worker        Arman Filter, NP 04/30/12 [phone]

## 2012-04-30 NOTE — ED Notes (Signed)
Pt states he has "real bad depression and has schizophrenia too". Takes meds for depression but not schizophrenia. States he had thoughts of jumping from a bridge tonight; he didn't know why. "That schizophrenia makes me do crazy things sometimes. Pt provided sandwich and soda. Pt denied other needs.

## 2012-04-30 NOTE — ED Notes (Signed)
Pt called [employer], states he was thinking about jumping off bridge, pt states he has been off meds x 1 week and has been using crack tonight.

## 2012-04-30 NOTE — ED Notes (Signed)
Report to s. Chad rn

## 2012-04-30 NOTE — ED Provider Notes (Signed)
Medical screening examination/treatment/procedure(s) were performed by non-physician practitioner and as supervising physician I was immediately available for consultation/collaboration.    Vida Roller, MD 04/30/12 [phone]

## 2012-04-30 NOTE — ED Notes (Signed)
Up to the bathroom 

## 2012-05-01 DIAGNOSIS — R45851 Suicidal ideations: Secondary | ICD-10-CM | POA: Diagnosis not present

## 2012-05-01 DIAGNOSIS — F200 Paranoid schizophrenia: Secondary | ICD-10-CM | POA: Diagnosis not present

## 2012-05-01 DIAGNOSIS — F17200 Nicotine dependence, unspecified, uncomplicated: Secondary | ICD-10-CM | POA: Diagnosis not present

## 2012-05-01 DIAGNOSIS — Z609 Problem related to social environment, unspecified: Secondary | ICD-10-CM | POA: Diagnosis not present

## 2012-05-01 NOTE — ED Notes (Signed)
Per patient, states he is not suicidal/homicidal-is not hearing voices or seeing things-states he has a case worker and will continue to take his meds-patient states he does not want to go to an inpatient behavioral facility-ED case worker aware

## 2012-05-01 NOTE — BHH Counselor (Addendum)
Staff from [facility] called and asked if patient still needed placement. Writer informed caller that patient continues to need placement. Patient accepted to [facility] by Dr. Roselle Locus. Call report to [phone]. Writer shared this information with EDP-Dr. Deretha Emory and he agreed to discharge patient accordingly. Writer also shared this information with patient's nurse Misty Stanley.   *Writer informed patient of his disposition to [facility]. Patient stating, "I don't really want to go". Patient sts he feels better but still has "thoughts just not as bad". Patient appears to continue to have thoughts of self harm, therefore; Clinical research associate assisted EDP-Dr. Deretha Emory with completing IVC paperwork to insure a safe transfer to [facility].   Writer faxed petition and affidavit to the facility.

## 2012-05-01 NOTE — ED Notes (Signed)
Report called to [facility] at [facility]

## 2012-05-01 NOTE — ED Provider Notes (Addendum)
The patient here for voluntary commitment for suicidal ideation patient is on the list for [facility] they are currently at capacity but thought there would be some openings this morning on August 5 patient is resting has been stable overnight also has a history of substance abuse.    Shelda Jakes, MD 05/01/12 [phone]   Addendum:  Patient has been accepted for the suicidal ideation by [facility] excepting physician is Dr. Roselle Locus. Patient now is having second thoughts about going he was a voluntary commitment with the IVC because he does require inpatient treatment. Transfer paperwork completed.  Shelda Jakes, MD 05/01/12 [phone]

## 2012-05-01 NOTE — ED Notes (Signed)
Report received-airway intact-no s/s's of distress-will continue to monitor 

## 2012-05-01 NOTE — ED Notes (Signed)
[employer] notified of transport to [facility] call prior to pick up

## 2012-10-18 ENCOUNTER — Encounter (HOSPITAL_COMMUNITY): Payer: Self-pay | Admitting: *Deleted

## 2012-10-18 ENCOUNTER — Emergency Department (HOSPITAL_COMMUNITY)
Admission: EM | Admit: 2012-10-18 | Discharge: 2012-10-19 | Disposition: A | Discharge status: Home / Self Care | Payer: Medicare Other | Attending: Emergency Medicine | Admitting: Emergency Medicine

## 2012-10-18 DIAGNOSIS — S0992XA Unspecified injury of nose, initial encounter: Secondary | ICD-10-CM

## 2012-10-18 DIAGNOSIS — S0010XA Contusion of unspecified eyelid and periocular area, initial encounter: Secondary | ICD-10-CM | POA: Diagnosis not present

## 2012-10-18 DIAGNOSIS — Z79899 Other long term (current) drug therapy: Secondary | ICD-10-CM | POA: Diagnosis not present

## 2012-10-18 DIAGNOSIS — IMO0002 Reserved for concepts with insufficient information to code with codable children: Secondary | ICD-10-CM | POA: Insufficient documentation

## 2012-10-18 DIAGNOSIS — S199XXA Unspecified injury of neck, initial encounter: Secondary | ICD-10-CM | POA: Diagnosis not present

## 2012-10-18 DIAGNOSIS — R609 Edema, unspecified: Secondary | ICD-10-CM | POA: Diagnosis not present

## 2012-10-18 DIAGNOSIS — R040 Epistaxis: Secondary | ICD-10-CM | POA: Insufficient documentation

## 2012-10-18 DIAGNOSIS — R6889 Other general symptoms and signs: Secondary | ICD-10-CM | POA: Diagnosis not present

## 2012-10-18 DIAGNOSIS — S0993XA Unspecified injury of face, initial encounter: Secondary | ICD-10-CM | POA: Diagnosis not present

## 2012-10-18 DIAGNOSIS — F209 Schizophrenia, unspecified: Secondary | ICD-10-CM | POA: Diagnosis not present

## 2012-10-18 DIAGNOSIS — F17200 Nicotine dependence, unspecified, uncomplicated: Secondary | ICD-10-CM | POA: Insufficient documentation

## 2012-10-18 DIAGNOSIS — S1093XA Contusion of unspecified part of neck, initial encounter: Secondary | ICD-10-CM | POA: Diagnosis not present
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
## 2012-10-19 ENCOUNTER — Emergency Department (HOSPITAL_COMMUNITY): Payer: Medicare Other

## 2012-10-19 DIAGNOSIS — S0003XA Contusion of scalp, initial encounter: Secondary | ICD-10-CM | POA: Diagnosis not present
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
## 2012-10-21 NOTE — ED Provider Notes (Signed)
Medical screening examination/treatment/procedure(s) were performed by non-physician practitioner and as supervising physician I was immediately available for consultation/collaboration.   Loren Racer, MD 10/21/12 [phone]

## 2012-12-16 ENCOUNTER — Encounter (HOSPITAL_COMMUNITY): Payer: Self-pay | Admitting: *Deleted

## 2012-12-16 ENCOUNTER — Emergency Department (HOSPITAL_COMMUNITY)
Admission: EM | Admit: 2012-12-16 | Discharge: 2012-12-18 | Disposition: A | Discharge status: Other Acute Inpatient Hospital | Payer: Medicare Other | Attending: Emergency Medicine | Admitting: Emergency Medicine

## 2012-12-16 DIAGNOSIS — F209 Schizophrenia, unspecified: Secondary | ICD-10-CM | POA: Diagnosis not present

## 2012-12-16 DIAGNOSIS — F203 Undifferentiated schizophrenia: Secondary | ICD-10-CM

## 2012-12-16 DIAGNOSIS — F29 Unspecified psychosis not due to a substance or known physiological condition: Secondary | ICD-10-CM

## 2012-12-16 DIAGNOSIS — F2089 Other schizophrenia: Secondary | ICD-10-CM | POA: Diagnosis not present

## 2012-12-16 DIAGNOSIS — F17200 Nicotine dependence, unspecified, uncomplicated: Secondary | ICD-10-CM | POA: Insufficient documentation

## 2012-12-16 LAB — COMPREHENSIVE METABOLIC PANEL
ALT: 12 U/L (ref 0–53)
Albumin: 4.2 g/dL (ref 3.5–5.2)
Calcium: 9.6 mg/dL (ref 8.4–10.5)
GFR calc Af Amer: >90 mL/min (ref >90)
Glucose, Bld: 79 mg/dL (ref 70–99)
Sodium: 135 mEq/L (ref 135–145)
Total Protein: 7.6 g/dL (ref 6.0–8.3)

## 2012-12-16 LAB — RAPID URINE DRUG SCREEN, HOSP PERFORMED
Benzodiazepines: NOT DETECTED
Cocaine: POSITIVE — AB
Opiates: NOT DETECTED
Tetrahydrocannabinol: NOT DETECTED

## 2012-12-16 LAB — ETHANOL: Alcohol, Ethyl (B): <11 mg/dL (ref 0–11)

## 2012-12-16 LAB — CBC
Hemoglobin: 16.4 g/dL (ref 13.0–17.0)
MCH: 32 pg (ref 26.0–34.0)
MCHC: 35.7 g/dL (ref 30.0–36.0)
Platelets: 216 10*3/uL (ref 150–400)
RDW: 13.1 % (ref 11.5–15.5)

## 2012-12-16 LAB — ACETAMINOPHEN LEVEL: Acetaminophen (Tylenol), Serum: <15 ug/mL (ref 10–30)

## 2012-12-16 MED ORDER — LORAZEPAM 1 MG PO TABS
1.0000 mg | ORAL_TABLET | Freq: Three times a day (TID) | ORAL | Status: DC | PRN
  Administered 2012-12-17 – 2012-12-18 (×2): 1 mg via ORAL
  Filled 2012-12-16 (×2): qty 1

## 2012-12-16 NOTE — ED Notes (Signed)
Up to the bathroom, ambulatory w/o difficulty 
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
## 2012-12-16 NOTE — ED Notes (Signed)
1 bag of belongings locked in locker #40

## 2012-12-16 NOTE — ED Notes (Signed)
Pt wanded by security. 

## 2012-12-16 NOTE — ED Provider Notes (Signed)
  Medical screening examination/treatment/procedure(s) were performed by non-physician practitioner and as supervising physician I was immediately available for consultation/collaboration.    Aziah Brostrom, MD 12/16/12 1548 
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
## 2012-12-16 NOTE — BHH Counselor (Signed)
This writer attempted to assess pt but pt irritable and refuses to participate. When writer asks pt questions, pt replies "I just want my f*%#$ medicine." Pt agrees to participate when writer returns in a few hrs.   [name], [place] Assessment Counselor

## 2012-12-16 NOTE — ED Notes (Signed)
One belonging bag at nurses station.

## 2012-12-16 NOTE — ED Provider Notes (Signed)
History     CSN: [id number]  Arrival date & time 12/16/12  1306   First MD Initiated Contact with Patient 12/16/12 1341      Chief Complaint  Patient presents with  . Medical Clearance    (Consider location/radiation/quality/duration/timing/severity/associated sxs/prior treatment) HPI Comments: This is a 37 year old male, past medical history remarkable for paranoid schizophrenia, who presents emergency department with a chief complaint of "revisiting his past." Patient states that he has been seeing visions of his past. He states that he is no longer taking his schizophrenia medication, and would like to restart it. He denies any SI or HI. He states that he sees visions of a twin, or person who looks identical to himself. Also with reflections of seeing a "bloody bathtub." He states that he like to have his blood tested to see if he has a twin.  The history is provided by the patient. No language interpreter was used.    Past Medical History  Diagnosis Date  . Schizophrenic disorder     History reviewed. No pertinent past surgical history.  No family history on file.  History  Substance Use Topics  . Smoking status: Current Every Day Smoker -- 1.00 packs/day for 15 years    Types: Cigarettes  . Smokeless tobacco: Not on file  . Alcohol Use: No      Review of Systems  All other systems reviewed and are negative.    Allergies  Haldol  Home Medications  No current outpatient prescriptions on file.  BP 122/74  Pulse 95  Temp(Src) 97.8 F (36.6 C) (Oral)  Resp 18  Ht 6' (1.829 m)  Wt 200 lb (90.719 kg)  BMI 27.12 kg/m2  SpO2 96%  Physical Exam  Nursing note and vitals reviewed. Constitutional: He is oriented to person, place, and time. He appears well-developed and well-nourished.  HENT:  Head: Normocephalic and atraumatic.  Eyes: Conjunctivae and EOM are normal. Pupils are equal, round, and reactive to light. Right eye exhibits no discharge. Left eye  exhibits no discharge. No scleral icterus.  Neck: Normal range of motion. Neck supple. No JVD present.  Cardiovascular: Normal rate, regular rhythm and normal heart sounds.  Exam reveals no gallop and no friction rub.   No murmur heard. Pulmonary/Chest: Effort normal and breath sounds normal. No respiratory distress. He has no wheezes. He has no rales. He exhibits no tenderness.  Abdominal: Soft. Bowel sounds are normal. He exhibits no distension and no mass. There is no tenderness. There is no rebound and no guarding.  Musculoskeletal: Normal range of motion. He exhibits no edema and no tenderness.  Neurological: He is alert and oriented to person, place, and time. He has normal reflexes.  CN 3-12 intact  Skin: Skin is warm and dry.  Psychiatric: He has a normal mood and affect. His behavior is normal. Judgment and thought content normal.    ED Course  Procedures (including critical care time)  Labs Reviewed  COMPREHENSIVE METABOLIC PANEL - Abnormal; Notable for the following:    Total Bilirubin 1.4 (*)    All other components within normal limits  SALICYLATE LEVEL - Abnormal; Notable for the following:    Salicylate Lvl <2.0 (*)    All other components within normal limits  URINE RAPID DRUG SCREEN (HOSP PERFORMED) - Abnormal; Notable for the following:    Cocaine POSITIVE (*)    All other components within normal limits  ACETAMINOPHEN LEVEL  CBC  ETHANOL   No results found.  No diagnosis found.    MDM  37 year old paranoid schizophrenic. Psych hold orders have been placed. We'll move the patient to the psych ED. Discussed the patient with the act team. Pending further evaluation of schizophrenia, and medication reconciliation.        Roxy Horseman, PA-C 12/16/12 1528

## 2012-12-17 NOTE — ED Notes (Signed)
Pt calmer, and is aware that ACT will be in to reassess.

## 2012-12-17 NOTE — ED Notes (Signed)
Pt is aware that he is being dc'd-will dc after breakfast.

## 2012-12-17 NOTE — Progress Notes (Signed)
Pt was recommended for discharge by ACT and Tele Psych but is now reporting he can't contract for safety.  Pt has a plan to end life by walking into traffic.    Pt appears to be anxious and though he has engaged in verbal aggression with nurses it appears to be stemming from an acute pt who is having outbursts and adding to the pt's already frustrated state.  He has been apologetic.  Pt denies HI or AVH.  Recommend inptx based on pt refusal to contract for safety, pt having a plan that he can implement given his resources.  While it remains to be unseen if pt is seeking housing or benefits of inptx rather than exhibiting mental health needs, the commitment to end one's life needs to be considered and placement an option.

## 2012-12-17 NOTE — ED Notes (Addendum)
On dc pt now reports that he is hearing voices/seeing things and is suicidal.  Pt angry, cursing, yelling.  Pt yelling at this writer "to get the f... Out of my room....why do you think I'm here..."  Pt reports that he wants to go into the hospital so someone can fill out a FL2 form. Pt yelling declined to answer any other questions and jumped out of bed to force the door closed .  Dr Lyndal Pulley ACT reassess.

## 2012-12-17 NOTE — BHH Counselor (Addendum)
Pt continues to deny SI and HI to Clinical research associate. Writer encouraged pt to contact his ACTT team at [facility] so he can get his meds as prescribed by Dr. Betti Cruz who works at the agency. He indicates that agency is also his payee. Pt to be d/c.   [name], [place] Assessment Counselor

## 2012-12-17 NOTE — ED Notes (Signed)
Patient withdrawn and with dull, flat affect. No inappropriate behaviors noted.

## 2012-12-17 NOTE — ED Provider Notes (Addendum)
Pt awake, alert this am.  No hallucinations.  Acting appropriately.  Has been assessed by ACT who feels that he is ready for discharge.  Is followed as an outpt by the ACT team.  Rolan Bucco, MD 12/17/12 [phone]   Pt now not able to contract for safety, will attempt placemnt  Rolan Bucco, MD 12/17/12 1324

## 2012-12-17 NOTE — ED Notes (Signed)
Dr Romualdo Bolk into see

## 2012-12-17 NOTE — BH Assessment (Signed)
[facility] Assessment Progress Note  Pt reviewed with Assunta Found, FNP @ 18:45.  She agrees to accept pt to [facility] pending availability of a 400 hall bed.  Currently no suitable beds are available.  At 19:00 I called Scherrie Merritts, LCSW, Assessment Counselor, to notify him.  Doylene Canning, MA Assessment Counselor 12/17/2012 @ 19:06

## 2012-12-17 NOTE — ED Notes (Signed)
Pt refused vitals at this time. States he just wants to rest right now and when breakfast is served he will get vitals done.

## 2012-12-18 NOTE — [facility] Counselor (Signed)
Patient accepted to [facility] by Dr. Forrestine Him. The attending physician is Dr. Betti Cruz. Call report # is [phone].

## 2012-12-18 NOTE — [facility] Counselor (Signed)
Patient is currently voluntary, however; does not seem appropriate or able to sign himself in at [facility]. Writer assisted the EDP-Dr. Juleen China with completing IVC papers. The IVC papers were faxed to the magistrate's office. Confirmation and receipt of paperwork was with Magistrate Walker 1:16pm. Currently waiting on custody order to be served to patient.

## 2012-12-19 DIAGNOSIS — F329 Major depressive disorder, single episode, unspecified: Secondary | ICD-10-CM | POA: Diagnosis not present

## 2012-12-20 DIAGNOSIS — F329 Major depressive disorder, single episode, unspecified: Secondary | ICD-10-CM | POA: Diagnosis not present

## 2012-12-21 DIAGNOSIS — F329 Major depressive disorder, single episode, unspecified: Secondary | ICD-10-CM | POA: Diagnosis not present

## 2012-12-22 DIAGNOSIS — F329 Major depressive disorder, single episode, unspecified: Secondary | ICD-10-CM | POA: Diagnosis not present

## 2013-04-02 ENCOUNTER — Encounter (HOSPITAL_COMMUNITY): Payer: Self-pay | Admitting: *Deleted

## 2013-04-02 ENCOUNTER — Emergency Department (HOSPITAL_COMMUNITY)
Admission: EM | Admit: 2013-04-02 | Discharge: 2013-04-03 | Disposition: A | Discharge status: Home / Self Care | Payer: Medicare Other | Attending: Emergency Medicine | Admitting: Emergency Medicine

## 2013-04-02 DIAGNOSIS — F209 Schizophrenia, unspecified: Secondary | ICD-10-CM | POA: Insufficient documentation

## 2013-04-02 DIAGNOSIS — M79609 Pain in unspecified limb: Secondary | ICD-10-CM | POA: Diagnosis not present

## 2013-04-02 DIAGNOSIS — Z79899 Other long term (current) drug therapy: Secondary | ICD-10-CM | POA: Diagnosis not present

## 2013-04-02 DIAGNOSIS — R45851 Suicidal ideations: Secondary | ICD-10-CM | POA: Insufficient documentation

## 2013-04-02 DIAGNOSIS — L539 Erythematous condition, unspecified: Secondary | ICD-10-CM | POA: Diagnosis not present

## 2013-04-02 DIAGNOSIS — L97309 Non-pressure chronic ulcer of unspecified ankle with unspecified severity: Secondary | ICD-10-CM | POA: Insufficient documentation

## 2013-04-02 DIAGNOSIS — Z5900 Homelessness unspecified: Secondary | ICD-10-CM | POA: Insufficient documentation

## 2013-04-02 DIAGNOSIS — M79673 Pain in unspecified foot: Secondary | ICD-10-CM

## 2013-04-02 DIAGNOSIS — F17200 Nicotine dependence, unspecified, uncomplicated: Secondary | ICD-10-CM | POA: Diagnosis not present

## 2013-04-02 NOTE — ED Notes (Signed)
Foot pain x 2 months

## 2013-04-02 NOTE — ED Notes (Signed)
Pt with reddened escoriated area left inner ankle

## 2013-04-03 ENCOUNTER — Encounter (HOSPITAL_COMMUNITY): Payer: Self-pay | Admitting: Registered Nurse

## 2013-04-03 LAB — POCT I-STAT, CHEM 8
BUN: 10 mg/dL (ref 6–23)
Chloride: 99 mEq/L (ref 96–112)
Potassium: 3.3 mEq/L — ABNORMAL LOW (ref 3.5–5.1)
Sodium: 140 mEq/L (ref 135–145)

## 2013-04-03 LAB — RAPID URINE DRUG SCREEN, HOSP PERFORMED
Barbiturates: NOT DETECTED
Cocaine: POSITIVE — AB

## 2013-04-03 LAB — BASIC METABOLIC PANEL
Calcium: 9 mg/dL (ref 8.4–10.5)
GFR calc non Af Amer: >90 mL/min (ref >90)
Glucose, Bld: 95 mg/dL (ref 70–99)
Sodium: 139 mEq/L (ref 135–145)

## 2013-04-03 MED ORDER — ZOLPIDEM TARTRATE 5 MG PO TABS: 5.0000 mg | ORAL_TABLET | Freq: Every evening | ORAL | Status: DC | PRN

## 2013-04-03 MED ORDER — POTASSIUM CHLORIDE CRYS ER 20 MEQ PO TBCR
40.0000 meq | EXTENDED_RELEASE_TABLET | Freq: Once | ORAL | Status: AC
  Administered 2013-04-03: 40 meq via ORAL
  Filled 2013-04-03: qty 2

## 2013-04-03 MED ORDER — ONDANSETRON HCL 4 MG PO TABS: 4.0000 mg | ORAL_TABLET | Freq: Three times a day (TID) | ORAL | Status: DC | PRN

## 2013-04-03 MED ORDER — DIVALPROEX SODIUM ER 500 MG PO TB24
500.0000 mg | ORAL_TABLET | Freq: Every day | ORAL | Status: DC
  Administered 2013-04-03: 500 mg via ORAL
  Filled 2013-04-03: qty 1

## 2013-04-03 MED ORDER — TRAZODONE HCL 50 MG PO TABS
50.0000 mg | ORAL_TABLET | Freq: Every evening | ORAL | Status: DC | PRN
  Administered 2013-04-03: 50 mg via ORAL
  Filled 2013-04-03: qty 1

## 2013-04-03 MED ORDER — MUPIROCIN CALCIUM 2 % EX CREA
TOPICAL_CREAM | Freq: Once | CUTANEOUS | Status: AC
  Administered 2013-04-03: 02:00:00 via TOPICAL
  Filled 2013-04-03: qty 15

## 2013-04-03 MED ORDER — IBUPROFEN 200 MG PO TABS: 600.0000 mg | ORAL_TABLET | Freq: Three times a day (TID) | ORAL | Status: DC | PRN

## 2013-04-03 MED ORDER — ACETAMINOPHEN 325 MG PO TABS: 650.0000 mg | ORAL_TABLET | ORAL | Status: DC | PRN

## 2013-04-03 MED ORDER — NICOTINE 21 MG/24HR TD PT24
21.0000 mg | MEDICATED_PATCH | Freq: Every day | TRANSDERMAL | Status: DC
  Filled 2013-04-03: qty 1

## 2013-04-03 MED ORDER — LORAZEPAM 1 MG PO TABS: 1.0000 mg | ORAL_TABLET | Freq: Three times a day (TID) | ORAL | Status: DC | PRN

## 2013-04-03 MED ORDER — CEPHALEXIN 500 MG PO CAPS
500.0000 mg | ORAL_CAPSULE | Freq: Once | ORAL | Status: AC
  Administered 2013-04-03: 500 mg via ORAL
  Filled 2013-04-03: qty 1

## 2013-04-03 NOTE — ED Notes (Signed)
Pt given a ham sandwich and a sprite. 

## 2013-04-03 NOTE — BH Assessment (Signed)
Assessment Note   [name] is a 37 y.o. male who initially presented for foot pain. The following information is collateral as pt was refusing interviews with counselor and Donell Sievert, PA.  Pt has several small ulcers on feet and ankle--bilateral feet.  Wound care has been completed on pt.'s feet with Bactroban, 2x2 and secured with medical tape.  Pt has been c/o foot pain x60mos. While being examined, pt stated that he was SI w/plan to shoot self with a gun. Pt has extensive schizophrenia hx and as well as inpt admission with [facility](2003-2012).  Pt admits to SI attempt in the last 30 days, will not disclose information.  Pt is non compliant with medications. Pt is homeless, poor hygiene, and bathes infrequently. Pt denies HI/AVH.        Axis I: Schizophrenia, Undiff  Axis II: Deferred Axis III:  Past Medical History  Diagnosis Date  . Schizophrenic disorder    Axis IV: economic problems, housing problems, other psychosocial or environmental problems, problems related to social environment and problems with primary support group Axis V: 31-40 impairment in reality testing  Past Medical History:  Past Medical History  Diagnosis Date  . Schizophrenic disorder     History reviewed. No pertinent past surgical history.  Family History: No family history on file.  Social History:  reports that he has been smoking Cigarettes.  He has a 15 pack-year smoking history. He does not have any smokeless tobacco history on file. He reports that he uses illicit drugs (Cocaine). He reports that he does not drink alcohol.  Additional Social History:  Alcohol / Drug Use Pain Medications: See MAR  Prescriptions: See MAR  Over the Counter: See MAR  History of alcohol / drug use?: Yes Longest period of sobriety (when/how long): None  Withdrawal Symptoms: Other (Comment) (No current w/d sxs) Substance #1 Name of Substance 1: Cocaine  1 - Age of First Use: Unk  1 - Amount (size/oz): Unk  1 -  Frequency: Unk  1 - Duration: Unk  1 - Last Use / Amount: Unk   CIWA: CIWA-Ar BP: 103/68 mmHg Pulse Rate: 95 COWS:    Allergies:  Allergies  Allergen Reactions  . Haldol (Haloperidol Decanoate)     'I curl up"    Home Medications:  (Not in a hospital admission)  OB/GYN Status:  No LMP for male patient.  General Assessment Data Location of Assessment: [facility] Living Arrangements: Alone Can pt return to current living arrangement?: Yes Admission Status: Voluntary Is patient capable of signing voluntary admission?: Yes Transfer from: Acute Hospital Referral Source: MD  Education Status Is patient currently in school?: No Current Grade: None  Highest grade of school patient has completed: None  Name of school: None  Contact person: None   Risk to self Suicidal Ideation: Yes-Currently Present Suicidal Intent: Yes-Currently Present Is patient at risk for suicide?: Yes Suicidal Plan?: Yes-Currently Present Specify Current Suicidal Plan: Shoot self in the head  Access to Means: Yes Specify Access to Suicidal Means: Pt has access to gun  What has been your use of drugs/alcohol within the last 12 months?: Abusing: Cocaine  Previous Attempts/Gestures: No How many times?: 0 Other Self Harm Risks: None  Triggers for Past Attempts: Unpredictable Intentional Self Injurious Behavior: None Family Suicide History: No Recent stressful life event(s): Other (Comment) (Unk ) Persecutory voices/beliefs?: No Depression: Yes Depression Symptoms: Loss of interest in usual pleasures;Feeling worthless/self pity Substance abuse history and/or treatment for substance abuse?:  Yes Suicide prevention information given to non-admitted patients: Not applicable  Risk to Others Homicidal Ideation: No Thoughts of Harm to Others: No Current Homicidal Intent: No Current Homicidal Plan: No Access to Homicidal Means: No Identified Victim: None  History of harm to others?: No Assessment of  Violence: None Noted Violent Behavior Description: None  Does patient have access to weapons?: Yes (Comment) (Pt has access to gun(s) ) Criminal Charges Pending?: No Does patient have a court date: No  Psychosis Hallucinations: None noted (Unk ) Delusions: None noted (Unk )  Mental Status Report Appear/Hygiene: Disheveled;Poor hygiene;Body odor Eye Contact: Poor Motor Activity: Unremarkable Speech: Unable to assess Level of Consciousness: Alert Mood: Depressed;Sad Affect: Depressed;Sad Anxiety Level: None Thought Processes:  (UTA) Judgement: Impaired Orientation: Unable to assess Obsessive Compulsive Thoughts/Behaviors: None  Cognitive Functioning Concentration: Normal Memory: Recent Intact;Remote Intact IQ: Average Insight: Poor Impulse Control: Poor Appetite: Fair Weight Loss: 0 Weight Gain: 0 Sleep: No Change Total Hours of Sleep: 6 Vegetative Symptoms: None  ADLScreening [facility]) Patient's cognitive ability adequate to safely complete daily activities?: Yes Patient able to express need for assistance with ADLs?: Yes Independently performs ADLs?: Yes (appropriate for developmental age)  Abuse/Neglect [facility]) Physical Abuse: Denies Verbal Abuse: Denies Sexual Abuse: Denies  Prior Inpatient Therapy Prior Inpatient Therapy: Yes Prior Therapy Dates: 2012,2011,2009,2008,2007,2006,2005,2004,2003 Prior Therapy Facilty/Provider(s): [facility]  Reason for Treatment: Schizophrenia/SI/Depression   Prior Outpatient Therapy Prior Outpatient Therapy: No Prior Therapy Dates: None  Prior Therapy Facilty/Provider(s): None  Reason for Treatment: None   ADL Screening (condition at time of admission) Patient's cognitive ability adequate to safely complete daily activities?: Yes Is the patient deaf or have difficulty hearing?: No Does the patient have difficulty seeing, even when wearing glasses/contacts?: No Does the patient have difficulty concentrating,  remembering, or making decisions?: No Patient able to express need for assistance with ADLs?: Yes Does the patient have difficulty dressing or bathing?: No Independently performs ADLs?: Yes (appropriate for developmental age) Does the patient have difficulty walking or climbing stairs?: No Weakness of Legs: None Weakness of Arms/Hands: None  Home Assistive Devices/Equipment Home Assistive Devices/Equipment: None  Therapy Consults (therapy consults require a physician order) PT Evaluation Needed: No OT Evalulation Needed: No SLP Evaluation Needed: No Abuse/Neglect Assessment (Assessment to be complete while patient is alone) Physical Abuse: Denies Verbal Abuse: Denies Sexual Abuse: Denies Exploitation of patient/patient's resources: Denies Self-Neglect: Denies Values / Beliefs Cultural Requests During Hospitalization: None Spiritual Requests During Hospitalization: None Consults Spiritual Care Consult Needed: No Social Work Consult Needed: No Merchant navy officer (For Healthcare) Advance Directive: Patient does not have advance directive;Patient would not like information Pre-existing out of facility DNR order (yellow form or pink MOST form): No Nutrition Screen- [facility] Adult/[facility] Patient's home diet: Regular  Additional Information 1:1 In Past 12 Months?: No CIRT Risk: No Elopement Risk: No Does patient have medical clearance?: Yes     Disposition:  Disposition Initial Assessment Completed for this Encounter: Yes Disposition of Patient: Inpatient treatment program;Referred to (Accepted by Donell Sievert, pending 400 hall bed ) Type of inpatient treatment program: Adult Patient referred to: Other (Comment) (Accepted by Donell Sievert, pending 400 hall )  On Site Evaluation by:   Reviewed with Physician:     Murrell Redden 04/03/2013 6:41 AM

## 2013-04-03 NOTE — ED Notes (Signed)
Pt transferred from main ed, presents for medical clearance.  Pt SI, wants to shoot self in head.  Denies HO or AV hallucinations.  Feeling hopeless.  Attempted SI within the past 30 days, will not elaborate further, stating he is sleepy.  Admits to hx of Schizophrenia.  Pt anxious & irritable.

## 2013-04-03 NOTE — ED Notes (Signed)
Pt laughing to self.  When asked what was funny pt replied"nothing, nothing at all."

## 2013-04-03 NOTE — Consult Note (Signed)
Reason for Consult:Eval for IP psychiatric Tx Referring Physician: Bonk MD  [name] is an 37 y.o. male.  HPI: pt is a 37 y/o WM known to [facility] last admission 12/16/2012, with prior hx of paranoid schizophrenia endorsing SI ( telling the EDP I want to shoot myself in the head) while having his feet looked at in the [facility] ED. Pt is a poor historian and cannot elaborate when he last took his psychotropics and when his last OP psychiatric encounter was. Pt is denying HI or AVH but cannot contract for safety at present. Pt is limited and vague at this present time pertinent to his subjective concerns.  Past Medical History  Diagnosis Date  . Schizophrenic disorder     History reviewed. No pertinent past surgical history.  No family history on file.  Social History:  reports that he has been smoking Cigarettes.  He has a 15 pack-year smoking history. He does not have any smokeless tobacco history on file. He reports that he uses illicit drugs (Cocaine). He reports that he does not drink alcohol.  Allergies:  Allergies  Allergen Reactions  . Haldol (Haloperidol Decanoate)     'I curl up"    Medications: I have reviewed the patient's current medications.  Results for orders placed during the hospital encounter of 04/02/13 (from the past 48 hour(s))  BASIC METABOLIC PANEL     Status: Abnormal   Collection Time    04/03/13  1:22 AM      Result Value Range   Sodium 139  135 - 145 mEq/L   Potassium 3.4 (*) 3.5 - 5.1 mEq/L   Chloride 101  96 - 112 mEq/L   CO2 30  19 - 32 mEq/L   Glucose, Bld 95  70 - 99 mg/dL   BUN 10  6 - 23 mg/dL   Creatinine, Ser 1.61  0.50 - 1.35 mg/dL   Calcium 9.0  8.4 - 09.6 mg/dL   GFR calc non Af Amer >90  >90 mL/min   GFR calc Af Amer >90  >90 mL/min   Comment:            The eGFR has been calculated     using the CKD EPI equation.     This calculation has not been     validated in all clinical     situations.     eGFR's persistently     <90 mL/min  signify     possible Chronic Kidney Disease.  URINE RAPID DRUG SCREEN (HOSP PERFORMED)     Status: Abnormal   Collection Time    04/03/13  1:26 AM      Result Value Range   Opiates NONE DETECTED  NONE DETECTED   Cocaine POSITIVE (*) NONE DETECTED   Benzodiazepines NONE DETECTED  NONE DETECTED   Amphetamines NONE DETECTED  NONE DETECTED   Tetrahydrocannabinol NONE DETECTED  NONE DETECTED   Barbiturates NONE DETECTED  NONE DETECTED   Comment:            DRUG SCREEN FOR MEDICAL PURPOSES     ONLY.  IF CONFIRMATION IS NEEDED     FOR ANY PURPOSE, NOTIFY LAB     WITHIN 5 DAYS.                LOWEST DETECTABLE LIMITS     FOR URINE DRUG SCREEN     Drug Class       Cutoff (ng/mL)     Amphetamine  1000     Barbiturate      200     Benzodiazepine   200     Tricyclics       300     Opiates          300     Cocaine          300     THC              50  POCT I-STAT, CHEM 8     Status: Abnormal   Collection Time    04/03/13  1:32 AM      Result Value Range   Sodium 140  135 - 145 mEq/L   Potassium 3.3 (*) 3.5 - 5.1 mEq/L   Chloride 99  96 - 112 mEq/L   BUN 10  6 - 23 mg/dL   Creatinine, Ser 1.61  0.50 - 1.35 mg/dL   Glucose, Bld 94  70 - 99 mg/dL   Calcium, Ion 0.96  0.45 - 1.23 mmol/L   TCO2 27  0 - 100 mmol/L   Hemoglobin 13.9  13.0 - 17.0 g/dL   HCT 40.9  81.1 - 91.4 %    No results found.  Review of Systems  Unable to perform ROS: psychiatric disorder  Psychiatric/Behavioral: Positive for depression and substance abuse. Negative for suicidal ideas and hallucinations. The patient is not nervous/anxious.        Patient endorses SI wanting to shoot himself in the head, having racing thoughts with depression rated 8/10. Denies HI or AVH but cannot contract for safety   Blood pressure 155/94, pulse 105, temperature 97.6 F (36.4 C), resp. rate 20, SpO2 97.00%. Physical Exam  Nursing note and vitals reviewed. Constitutional: He is oriented to person, place, and time. He  appears well-developed.  Eyes: Pupils are equal, round, and reactive to light.  Neck: Neck supple. No thyromegaly present.  Cardiovascular: Normal rate and regular rhythm.   Respiratory: Effort normal and breath sounds normal.  Neurological: He is alert and oriented to person, place, and time. No cranial nerve deficit.  Skin: Skin is warm and dry.  Blisters bilaterally feet wrapped in bandages.  Psychiatric: He has a normal mood and affect. His behavior is normal. Thought content is not paranoid. Cognition and memory are normal. He expresses no homicidal and no suicidal ideation.    Assessment/Plan: 1) Admit to [facility] pending bed for crises mgmt, safety and stabilization of mood d/o with acute psychosis and SI 2) Social work to aid in OP support services to decrease relapse and frequent readmissions 3) Mgmt of applicable co-morbid conditions 4) Intensive IP psychotherapy and re introduction of psychotropic medications  [name],[name] 04/03/2013, 2:55 AM    04/03/2013  Face to face and follow up with Dr. Lolly Mustache  Assessment/Plan;  Axis I: Depressive Disorder NOS  Patient states that he is not suicidal.  I just said those things because they won't do surgery on my feet.  Look at them, I know something is wrong"  Patient states that he has outpatient services with [facility] and with ACT team which he sees 3 times a week.  Patient denies SI, HI, psychosis, and paranoia.  Patient states that he does not need inpatient treatment   Recommendation:  Discharge home.  Contact patient outpatient service for follow up     I personally seen the patient agreed with the findings and involved in the treatment plan.

## 2013-04-03 NOTE — ED Notes (Signed)
Pt has been walking without socks and has developed superficial abrasions on his foot.  Both feet are effected Pt able to bear weight and walk on feet.  Wound care performed on left inner ankle area.  2x2 and bactroban applied and secured with tape.  Pt able to put socks back on feet.

## 2013-04-03 NOTE — ED Notes (Signed)
Pt's feet washed and wound covered with Bactroban and a 2x2.

## 2013-04-03 NOTE — ED Provider Notes (Signed)
10:59 AM Patient was seen and evaluated by the psychiatrist Dr. Francella Solian, MD who recommends discharge home.  He believes the patient is a threat to himself or to others and the patient's outpatient resources.  I did not personally evaluate this patient.  Please see consultation note for complete details  Lyanne Co, MD 04/03/13 1100

## 2013-04-03 NOTE — ED Notes (Signed)
Pt has stated to the Nurse Practitioner that he is now suicidal.  That he wants to shoot himself in the head.  WHen asked if he has a gun pt stated "I don't but if I did I would shoot myself."

## 2013-04-03 NOTE — ED Provider Notes (Signed)
History    CSN: [id number] Arrival date & time 04/02/13  2313  First MD Initiated Contact with Patient 04/03/13 0045     Chief Complaint  Patient presents with  . Foot Pain   (Consider location/radiation/quality/duration/timing/severity/associated sxs/prior Treatment) HPI Comments: Patient initially presented to the emergency department claiming foot pain for 2 months.  On examination.  Patient states, that he is suicidal, if he had a gun he would shoot himself.  He does have a past history of suicide attempt by stepping in front of her car.  He does have a long-standing history of schizophrenia.  He, states he has not been taking his medications, which include, Zyprexa. He does have several small ulcers on his feet, most significant being of the medial left ankle.  He does wear high top sneakers without socks, or any kind of protective gauze on his feet.  He is homeless and bathe infrequently.    Patient is a 37 y.o. male presenting with lower extremity pain. The history is provided by the patient.  Foot Pain This is a new problem. The current episode started in the past 7 days. The problem occurs constantly. Pertinent negatives include no chills, coughing or fever.   Past Medical History  Diagnosis Date  . Schizophrenic disorder    History reviewed. No pertinent past surgical history. No family history on file. History  Substance Use Topics  . Smoking status: Current Every Day Smoker -- 1.00 packs/day for 15 years    Types: Cigarettes  . Smokeless tobacco: Not on file  . Alcohol Use: No    Review of Systems  Constitutional: Negative for fever and chills.  Respiratory: Negative for cough.   Skin: Positive for wound.  Psychiatric/Behavioral: Positive for suicidal ideas.  All other systems reviewed and are negative.    Allergies  Haldol  Home Medications   Current Outpatient Rx  Name  Route  Sig  Dispense  Refill  . OLANZapine (ZYPREXA PO)   Oral   Take 1 tablet by  mouth.          BP 155/94  Pulse 105  Temp(Src) 97.6 F (36.4 C)  Resp 20  SpO2 97% Physical Exam  Nursing note and vitals reviewed. Constitutional: He is oriented to person, place, and time. He appears well-developed and well-nourished.  Unkempt and disheveled  Eyes: Pupils are equal, round, and reactive to light.  Neck: Normal range of motion.  Cardiovascular: Normal rate and regular rhythm.   Pulmonary/Chest: Effort normal.  Musculoskeletal: Normal range of motion. He exhibits tenderness.       Feet:  Superficial ulcer  Neurological: He is alert and oriented to person, place, and time.  Skin: Skin is warm. There is erythema.    ED Course  Procedures (including critical care time) Labs Reviewed  BASIC METABOLIC PANEL - Abnormal; Notable for the following:    Potassium 3.4 (*)    All other components within normal limits  URINE RAPID DRUG SCREEN (HOSP PERFORMED) - Abnormal; Notable for the following:    Cocaine POSITIVE (*)    All other components within normal limits  POCT I-STAT, CHEM 8 - Abnormal; Notable for the following:    Potassium 3.3 (*)    All other components within normal limits  CBC WITH DIFFERENTIAL  URINE RAPID DRUG SCREEN (HOSP PERFORMED)   No results found. No diagnosis found.  MDM  Superficial skin irritation, and ulcers on this patients, ankles, and feet appear to be irritation from his shoes  due to the fact that he is very uncapped.  It does not bathe on a regular wheezes, and does not wear socks  Arman Filter, NP 04/03/13 [phone]

## 2013-04-03 NOTE — ED Provider Notes (Signed)
Medical screening examination/treatment/procedure(s) were performed by non-physician practitioner and as supervising physician I was immediately available for consultation/collaboration.  John-Adam Samie Reasons, M.D.     John-Adam Lorre Opdahl, MD 04/03/13 0457 

## 2013-04-03 NOTE — ED Notes (Signed)
Pt agitated that he will not receive surgery on his feet.  States will be going to another hospital.  Pt explained medical clearance procedures and pt states" I know, I've done it before."

## 2013-04-03 NOTE — [facility] Counselor (Signed)
Pt evaluated by Dr. Lolly Mustache and Assunta Found, NP. Pt to follow up with his ACTT team was recommended. The [facility] patient's ACTT was contacted. However, unable to contact anyone at this time. Writer left a voicemail asking someone from this facility to call back asap. This writer would like make appropriate follow up arrangements/appointments for this patient prior to his discharge from the ED.   Contact information:  [facility] [address] [place] [address] Telephone: [phone]   Writer met with patient to discuss his disposition. Also informed patient that the psychiatrist recommends that he follows up with outpatient. Patient sts, "Oh do't worry about it I will be fine". Pt asking to be discharged stating he is not suicidal and only here due to medical issues with his foot. Writer emphasized to patient the importance of following up with his ACTT team and asked if they had a emergency contact #. Pt sts, "They are on vacation" please don't worry about that I just want to be discharged.   Informed psychiatry-Dr. Lolly Mustache that patient is obviously not interested in remaining in the ED any further. Patient continues to ask this writer to have him discharged. Although, it is important to provide patient with appropriate follow up patient is voluntary and has asked to discharge from the ED several times. Writer will discuss with psychiatry-Dr. Lolly Mustache if providing patient with the paperwork/information to his reported ACCT team-[facility], [facility], and [facility] will be sufficient as patient has verbalized he is ready to leave at this time.

## 2013-04-10 ENCOUNTER — Encounter (HOSPITAL_COMMUNITY): Payer: Self-pay | Admitting: Family Medicine

## 2013-04-10 ENCOUNTER — Emergency Department (HOSPITAL_COMMUNITY)
Admission: EM | Admit: 2013-04-10 | Discharge: 2013-04-11 | Disposition: A | Discharge status: Home / Self Care | Payer: Medicare Other | Attending: Emergency Medicine | Admitting: Emergency Medicine

## 2013-04-10 DIAGNOSIS — R443 Hallucinations, unspecified: Secondary | ICD-10-CM | POA: Insufficient documentation

## 2013-04-10 DIAGNOSIS — F17200 Nicotine dependence, unspecified, uncomplicated: Secondary | ICD-10-CM | POA: Diagnosis not present

## 2013-04-10 DIAGNOSIS — R45851 Suicidal ideations: Secondary | ICD-10-CM

## 2013-04-10 DIAGNOSIS — F209 Schizophrenia, unspecified: Secondary | ICD-10-CM | POA: Diagnosis not present

## 2013-04-10 DIAGNOSIS — F203 Undifferentiated schizophrenia: Secondary | ICD-10-CM

## 2013-04-10 LAB — COMPREHENSIVE METABOLIC PANEL
ALT: 10 U/L (ref 0–53)
AST: 11 U/L (ref 0–37)
Calcium: 9.2 mg/dL (ref 8.4–10.5)
GFR calc Af Amer: >90 mL/min (ref >90)
Sodium: 142 mEq/L (ref 135–145)
Total Protein: 6.6 g/dL (ref 6.0–8.3)

## 2013-04-10 LAB — CBC
MCH: 32.1 pg (ref 26.0–34.0)
MCHC: 36.1 g/dL — ABNORMAL HIGH (ref 30.0–36.0)
Platelets: 211 10*3/uL (ref 150–400)
RDW: 13.1 % (ref 11.5–15.5)

## 2013-04-10 LAB — SALICYLATE LEVEL: Salicylate Lvl: <2 mg/dL — ABNORMAL LOW (ref 2.8–20.0)

## 2013-04-10 LAB — RAPID URINE DRUG SCREEN, HOSP PERFORMED
Amphetamines: NOT DETECTED
Benzodiazepines: NOT DETECTED
Opiates: NOT DETECTED

## 2013-04-10 MED ORDER — ACETAMINOPHEN 325 MG PO TABS: 650.0000 mg | ORAL_TABLET | ORAL | Status: DC | PRN

## 2013-04-10 MED ORDER — IBUPROFEN 400 MG PO TABS: 600.0000 mg | ORAL_TABLET | Freq: Three times a day (TID) | ORAL | Status: DC | PRN

## 2013-04-10 MED ORDER — NICOTINE 21 MG/24HR TD PT24: 21.0000 mg | MEDICATED_PATCH | Freq: Every day | TRANSDERMAL | Status: DC

## 2013-04-10 MED ORDER — ALUM & MAG HYDROXIDE-SIMETH 200-200-20 MG/5ML PO SUSP: 30.0000 mL | ORAL | Status: DC | PRN

## 2013-04-10 MED ORDER — ONDANSETRON HCL 4 MG PO TABS: 4.0000 mg | ORAL_TABLET | Freq: Three times a day (TID) | ORAL | Status: DC | PRN

## 2013-04-10 MED ORDER — LORAZEPAM 1 MG PO TABS: 1.0000 mg | ORAL_TABLET | Freq: Three times a day (TID) | ORAL | Status: DC | PRN

## 2013-04-10 NOTE — ED Provider Notes (Signed)
History    CSN: [id number] Arrival date & time 04/10/13  1818  First MD Initiated Contact with Patient 04/10/13 1841     Chief Complaint  Patient presents with  . Suicidal   (Consider location/radiation/quality/duration/timing/severity/associated sxs/prior Treatment) HPI Comments: Patient with history of paranoid schizophrenia -- presents with complaint of being suicidal. Patient states that he is going to shoot himself in the head. Patient admits to being off his medications and cannot remember the last time he took them. He admits to visual and auditory hallucinations however cannot give me an example. He does not recall what happened when he was admitted recently at [facility]. Patient states that he was out in the woods and he left his clothes in the woods. He denies other medical complaints at the current time. Patient denies alcohol use. Patient states that he used crack 1 week ago. The onset of this condition was acute. The course is constant. Aggravating factors: none. Alleviating factors: none.    The history is provided by the patient and medical records.   Past Medical History  Diagnosis Date  . Schizophrenic disorder    History reviewed. No pertinent past surgical history. History reviewed. No pertinent family history. History  Substance Use Topics  . Smoking status: Current Every Day Smoker -- 1.00 packs/day for 15 years    Types: Cigarettes  . Smokeless tobacco: Not on file  . Alcohol Use: No    Review of Systems  Constitutional: Negative for fever.  HENT: Negative for sore throat and rhinorrhea.   Eyes: Negative for redness.  Respiratory: Negative for cough.   Cardiovascular: Negative for chest pain.  Gastrointestinal: Negative for nausea, vomiting, abdominal pain and diarrhea.  Genitourinary: Negative for dysuria.  Musculoskeletal: Negative for myalgias.  Skin: Negative for rash.  Neurological: Negative for headaches.  Psychiatric/Behavioral:  Positive for suicidal ideas and hallucinations. Negative for self-injury.    Allergies  Haldol  Home Medications   Current Outpatient Rx  Name  Route  Sig  Dispense  Refill  . OLANZapine (ZYPREXA PO)   Oral   Take 1 tablet by mouth.          BP 130/87  Pulse 50  Temp(Src) 98.2 F (36.8 C) (Oral)  Resp 16  SpO2 97% Physical Exam  Nursing note and vitals reviewed. Constitutional: He appears well-developed and well-nourished.  HENT:  Head: Normocephalic and atraumatic.  Eyes: Conjunctivae are normal. Right eye exhibits no discharge. Left eye exhibits no discharge.  Neck: Normal range of motion. Neck supple.  Cardiovascular: Normal rate, regular rhythm and normal heart sounds.   Pulmonary/Chest: Effort normal and breath sounds normal.  Abdominal: Soft. There is no tenderness.  Neurological: He is alert.  Skin: Skin is warm and dry.  Psychiatric: He has a normal mood and affect. His behavior is normal. His speech is tangential. Cognition and memory are impaired. He expresses suicidal ideation. He expresses no homicidal ideation.  Patient is disheveled and poorly groomed    ED Course  Procedures (including critical care time) Labs Reviewed  CBC - Abnormal; Notable for the following:    MCHC 36.1 (*)    All other components within normal limits  COMPREHENSIVE METABOLIC PANEL - Abnormal; Notable for the following:    Potassium 3.4 (*)    Albumin 3.4 (*)    All other components within normal limits  SALICYLATE LEVEL - Abnormal; Notable for the following:    Salicylate Lvl <2.0 (*)    All other components  within normal limits  ETHANOL  ACETAMINOPHEN LEVEL  URINE RAPID DRUG SCREEN (HOSP PERFORMED)   No results found. 1. Suicidal ideation   2. Schizophrenia, undifferentiated     6:57 PM Patient seen and examined. Work-up initiated. Holding orders completed.    Vital signs reviewed and are as follows: Filed Vitals:   04/10/13 1822  BP: 130/87  Pulse: 50  Temp:  98.2 F (36.8 C)  Resp: 16   11:20 PM Pt medically cleared. Spoke with ACT who will see.    MDM  SI/paranoid schizophenia      Renne Crigler, PA-C 04/10/13 2322

## 2013-04-10 NOTE — ED Notes (Signed)
Security paged to wand pt 

## 2013-04-10 NOTE — ED Notes (Signed)
House coverage called for sitter.  

## 2013-04-10 NOTE — ED Notes (Signed)
POD C 20 WHEN MEDICALLY CLEARED BY PHYSICIAN

## 2013-04-10 NOTE — ED Notes (Signed)
Per pt sts that he is suicidal and wants to shoot himself in the head. Denied any plan. sts he has felt this way for a while. Pt denies any psychiatric hx or being treated for any psychiatric illness.

## 2013-04-11 NOTE — ED Provider Notes (Signed)
Medical screening examination/treatment/procedure(s) were conducted as a shared visit with non-physician practitioner(s) and myself.  I personally evaluated the patient during the encounter  36yo M, hx schizophrenia, presents to ED today with SI. States he is "going to shoot myself in the head." Endorses "not taking my meds in a long time."  Pt denies SA, no HI. VSS, A&O, resps easy, neuro non-focal, tangential speech, disheveled. Labs without acute abnormality. ACT to eval.     Laray Anger, DO 04/11/13 0010

## 2013-04-11 NOTE — BH Assessment (Addendum)
Assessment Note   [name] is an 37 y.o. male.  Patient came to [facility] this evening saying that he was depressed and suicidal.  Patient had said that he wanted to kill himself by shooting self in the head.  Patient has slept all evening.  Now that he is being assessed he currently denies wanting to kill himself.  Pt also denies access to a gun.  He also denies HI or A/V hallucinations.  When asked if he was off his medications he states that he does not take any and does not remember what they are.  Patient asked about going to his day program today.  He said that he goes to the day program operated by "[facility]."  They are located at [address] in [place].  Their phone number is [phone].  This clinician asked patient if he had come to the [facility] because it was raining outside and he was homeless.  Patient said, yes.  Patient was at [facility] on 04-03-13 and engaged in similar behavior.  He was discharged by Dr. Lolly Mustache (psychiatrist) at that time.  At this time patient wants to get a bus pass so he can go to day program.  Pt care was discussed with Dr. Lavella Lemons and she discharged him to go to his day program and follow up with his ACTT team. Axis I: 295.90 Schizophrenia, undifferentiated type Axis II: Deferred Axis III:  Past Medical History  Diagnosis Date  . Schizophrenic disorder    Axis IV: economic problems, housing problems, occupational problems, other psychosocial or environmental problems, problems with access to health care services and problems with primary support group Axis V: 41-50 serious symptoms  Past Medical History:  Past Medical History  Diagnosis Date  . Schizophrenic disorder     History reviewed. No pertinent past surgical history.  Family History: History reviewed. No pertinent family history.  Social History:  reports that he has been smoking Cigarettes.  He has a 15 pack-year smoking history. He does not have any smokeless tobacco history on  file. He reports that he uses illicit drugs (Cocaine). He reports that he does not drink alcohol.  Additional Social History:  Alcohol / Drug Use History of alcohol / drug use?: Yes Substance #1 Name of Substance 1: Cocaine 1 - Age of First Use: Unknown 1 - Amount (size/oz): Unknown 1 - Frequency: Pt would not elaborate 1 - Duration: Unknown 1 - Last Use / Amount: Unknown  CIWA: CIWA-Ar BP: 105/64 mmHg Pulse Rate: 95 COWS:    Allergies:  Allergies  Allergen Reactions  . Haldol (Haloperidol Decanoate)     'I curl up"    Home Medications:  (Not in a hospital admission)  OB/GYN Status:  No LMP for male patient.  General Assessment Data Location of Assessment: [facility] ED Living Arrangements: Alone Can pt return to current living arrangement?: Yes Admission Status: Voluntary Is patient capable of signing voluntary admission?: Yes Transfer from: Acute Hospital Referral Source: Self/Family/Friend     Risk to self Suicidal Ideation: No-Not Currently/Within Last 6 Months Suicidal Intent: No-Not Currently/Within Last 6 Months Is patient at risk for suicide?: No Suicidal Plan?: No (Pt had earlier said that he planned to shoot himself) Specify Current Suicidal Plan: Previously stated plan to shoot self Access to Means: Yes Specify Access to Suicidal Means: Pt may have access to a gun What has been your use of drugs/alcohol within the last 12 months?: Using cocaine Previous Attempts/Gestures: No How  many times?: 0 Other Self Harm Risks: None Triggers for Past Attempts: Unpredictable Intentional Self Injurious Behavior: None Family Suicide History: No Recent stressful life event(s): Other (Comment) (Pt denies any current stressors.) Persecutory voices/beliefs?: No Depression: Yes Depression Symptoms: Loss of interest in usual pleasures;Feeling worthless/self pity Substance abuse history and/or treatment for substance abuse?: Yes Suicide prevention information given to  non-admitted patients: Not applicable  Risk to Others Homicidal Ideation: No Thoughts of Harm to Others: No Current Homicidal Intent: No Current Homicidal Plan: No Access to Homicidal Means: No Identified Victim: No one History of harm to others?: No Assessment of Violence: None Noted Violent Behavior Description: None Does patient have access to weapons?: Yes (Comment) (Pt reportedly has access to guns.) Criminal Charges Pending?: No Does patient have a court date: No  Psychosis Hallucinations: None noted Delusions: None noted  Mental Status Report Appear/Hygiene: Disheveled;Poor hygiene;Body odor Eye Contact: Poor Motor Activity: Freedom of movement;Unremarkable Speech: Soft Level of Consciousness: Drowsy Mood: Depressed;Sad Affect: Depressed;Sad Anxiety Level: None Thought Processes: Coherent Judgement: Impaired Orientation: Person;Place;Situation Obsessive Compulsive Thoughts/Behaviors: None  Cognitive Functioning Concentration: Decreased Memory: Recent Impaired;Remote Intact IQ: Average Insight: Poor Impulse Control: Poor Appetite: Fair Weight Loss: 0 Weight Gain: 0 Sleep: Decreased Total Hours of Sleep:  (<6H/D) Vegetative Symptoms: None  ADLScreening [facility] Assessment Services) Patient's cognitive ability adequate to safely complete daily activities?: Yes Patient able to express need for assistance with ADLs?: Yes Independently performs ADLs?: Yes (appropriate for developmental age)  Abuse/Neglect [facility]) Physical Abuse: Denies Verbal Abuse: Denies Sexual Abuse: Denies  Prior Inpatient Therapy Prior Inpatient Therapy: Yes Prior Therapy Dates: 2012,2011,2009,2008,2007,2006,2005,2004,2003 Prior Therapy Facilty/Provider(s): [facility]  Reason for Treatment: Schizophrenia/SI/Depression   Prior Outpatient Therapy Prior Outpatient Therapy: Yes Prior Therapy Dates: Current Prior Therapy Facilty/Provider(s): [facility] Reason for Treatment: ACTT  team/Day Program  ADL Screening (condition at time of admission) Patient's cognitive ability adequate to safely complete daily activities?: Yes Is the patient deaf or have difficulty hearing?: No Does the patient have difficulty seeing, even when wearing glasses/contacts?: No Does the patient have difficulty concentrating, remembering, or making decisions?: Yes Patient able to express need for assistance with ADLs?: Yes Does the patient have difficulty dressing or bathing?: No Independently performs ADLs?: Yes (appropriate for developmental age) Does the patient have difficulty walking or climbing stairs?: No Weakness of Legs: None Weakness of Arms/Hands: None  Home Assistive Devices/Equipment Home Assistive Devices/Equipment: None    Abuse/Neglect Assessment (Assessment to be complete while patient is alone) Physical Abuse: Denies Verbal Abuse: Denies Sexual Abuse: Denies Exploitation of patient/patient's resources: Denies Self-Neglect: Denies     Merchant navy officer (For Healthcare) Advance Directive: Patient does not have advance directive;Patient would not like information    Additional Information 1:1 In Past 12 Months?: No CIRT Risk: No Elopement Risk: No Does patient have medical clearance?: Yes     Disposition:  Disposition Initial Assessment Completed for this Encounter: Yes Disposition of Patient: Other dispositions Type of inpatient treatment program: Adult Other disposition(s): To current provider (Pt wants to go to his day program today.) Patient referred to:  (Current provider "[facility]")  On Site Evaluation by:   Reviewed with Physician:  Renne Crigler, PA & Dr. Margaretmary Lombard, [name] Ray 04/11/2013 6:23 AM

## 2013-04-11 NOTE — ED Provider Notes (Signed)
Medical screening examination/treatment/procedure(s) were conducted as a shared visit with non-physician practitioner(s) and myself.  I personally evaluated the patient during the encounter Please see my previous note.  Laray Anger, DO 04/11/13 1549

## 2013-04-14 ENCOUNTER — Emergency Department (EMERGENCY_DEPARTMENT_HOSPITAL)
Admission: EM | Admit: 2013-04-14 | Discharge: 2013-04-15 | Disposition: A | Discharge status: Other Acute Inpatient Hospital | Payer: Medicare Other | Source: Home / Self Care | Attending: Emergency Medicine | Admitting: Emergency Medicine

## 2013-04-14 ENCOUNTER — Encounter (HOSPITAL_COMMUNITY): Payer: Self-pay

## 2013-04-14 DIAGNOSIS — F17200 Nicotine dependence, unspecified, uncomplicated: Secondary | ICD-10-CM | POA: Insufficient documentation

## 2013-04-14 DIAGNOSIS — F1410 Cocaine abuse, uncomplicated: Secondary | ICD-10-CM | POA: Insufficient documentation

## 2013-04-14 DIAGNOSIS — Z79899 Other long term (current) drug therapy: Secondary | ICD-10-CM | POA: Insufficient documentation

## 2013-04-14 DIAGNOSIS — Z5900 Homelessness unspecified: Secondary | ICD-10-CM | POA: Insufficient documentation

## 2013-04-14 DIAGNOSIS — F259 Schizoaffective disorder, unspecified: Secondary | ICD-10-CM | POA: Diagnosis not present

## 2013-04-14 DIAGNOSIS — F39 Unspecified mood [affective] disorder: Secondary | ICD-10-CM | POA: Insufficient documentation

## 2013-04-14 DIAGNOSIS — F203 Undifferentiated schizophrenia: Secondary | ICD-10-CM

## 2013-04-14 DIAGNOSIS — R45851 Suicidal ideations: Secondary | ICD-10-CM | POA: Insufficient documentation

## 2013-04-14 DIAGNOSIS — F209 Schizophrenia, unspecified: Secondary | ICD-10-CM | POA: Insufficient documentation

## 2013-04-14 LAB — CBC
HCT: 42.7 % (ref 39.0–52.0)
Hemoglobin: 14.6 g/dL (ref 13.0–17.0)
MCV: 90.1 fL (ref 78.0–100.0)
RBC: 4.74 MIL/uL (ref 4.22–5.81)
WBC: 9.1 10*3/uL (ref 4.0–10.5)

## 2013-04-14 LAB — COMPREHENSIVE METABOLIC PANEL
Albumin: 3.6 g/dL (ref 3.5–5.2)
Alkaline Phosphatase: 105 U/L (ref 39–117)
BUN: 8 mg/dL (ref 6–23)
CO2: 27 mEq/L (ref 19–32)
Chloride: 102 mEq/L (ref 96–112)
Creatinine, Ser: 0.67 mg/dL (ref 0.50–1.35)
GFR calc Af Amer: >90 mL/min (ref >90)
GFR calc non Af Amer: >90 mL/min (ref >90)
Glucose, Bld: 85 mg/dL (ref 70–99)
Potassium: 3.7 mEq/L (ref 3.5–5.1)
Total Bilirubin: 0.3 mg/dL (ref 0.3–1.2)

## 2013-04-14 LAB — ACETAMINOPHEN LEVEL: Acetaminophen (Tylenol), Serum: <15 ug/mL (ref 10–30)

## 2013-04-14 LAB — ETHANOL: Alcohol, Ethyl (B): <11 mg/dL (ref 0–11)

## 2013-04-14 MED ORDER — NICOTINE 21 MG/24HR TD PT24: 21.0000 mg | MEDICATED_PATCH | Freq: Every day | TRANSDERMAL | Status: DC

## 2013-04-14 MED ORDER — ZIPRASIDONE MESYLATE 20 MG IM SOLR: 20.0000 mg | Freq: Two times a day (BID) | INTRAMUSCULAR | Status: DC | PRN

## 2013-04-14 MED ORDER — ACETAMINOPHEN 325 MG PO TABS: 650.0000 mg | ORAL_TABLET | ORAL | Status: DC | PRN

## 2013-04-14 MED ORDER — ZOLPIDEM TARTRATE 5 MG PO TABS: 5.0000 mg | ORAL_TABLET | Freq: Every evening | ORAL | Status: DC | PRN

## 2013-04-14 MED ORDER — LORAZEPAM 1 MG PO TABS
2.0000 mg | ORAL_TABLET | Freq: Four times a day (QID) | ORAL | Status: DC | PRN
  Administered 2013-04-15: 2 mg via ORAL
  Filled 2013-04-14: qty 2

## 2013-04-14 NOTE — BHH Counselor (Signed)
Pt unable to stay awake for assessment. Will attempt to assess later in am.  [name], LCSWA Assessment Counselor

## 2013-04-14 NOTE — ED Notes (Signed)
I give report to Marcelino Duster, RN in psych. E.D.--pt. Then moved with security escort without incident.

## 2013-04-14 NOTE — ED Notes (Signed)
telepsych info faxed and called 

## 2013-04-14 NOTE — ED Notes (Signed)
At this time, he is sound asleep and in no distress.  I plan to talk to him momentarily.

## 2013-04-14 NOTE — ED Notes (Signed)
When I first asked him why he is here, or what he needed help with, he stated he did not know and that he did not "need anything".  When I specifically asked if he were having suicidal thoughts, he said yes.  When I asked how long he has had these feelings, he answered "All my life".  He is in no distress.  He is malodorous and appears to be homeless.

## 2013-04-14 NOTE — ED Provider Notes (Signed)
History    CSN: [id number] Arrival date & time 04/14/13  1537  First MD Initiated Contact with Patient 04/14/13 1658     Chief Complaint  Patient presents with  . Medical Clearance   (Consider location/radiation/quality/duration/timing/severity/associated sxs/prior Treatment) HPI patient reports that he is feeling suicidal. He would kill himself by shooting himself in the head.[name] He is presently homeless. On no medications. Has had multiple ED visits for similar complaint. Has been noncompliant with followup. Past Medical History  Diagnosis Date  . Schizophrenic disorder    History reviewed. No pertinent past surgical history. History reviewed. No pertinent family history. History  Substance Use Topics  . Smoking status: Current Every Day Smoker -- 1.00 packs/day for 15 years    Types: Cigarettes  . Smokeless tobacco: Not on file  . Alcohol Use: No   no illicit drug use  Review of Systems  Constitutional: Negative.   HENT: Negative.   Respiratory: Negative.   Cardiovascular: Negative.   Gastrointestinal: Negative.   Musculoskeletal: Negative.   Skin: Negative.   Neurological: Negative.   Psychiatric/Behavioral: Positive for dysphoric mood.  All other systems reviewed and are negative.    Allergies  Haldol  Home Medications   Current Outpatient Rx  Name  Route  Sig  Dispense  Refill  . OLANZapine (ZYPREXA) 20 MG tablet   Oral   Take 20 mg by mouth at bedtime.          BP 128/71  Pulse 92  Temp(Src) 98 F (36.7 C) (Oral)  Resp 20  SpO2 97% Physical Exam  Nursing note and vitals reviewed. Constitutional: He is oriented to person, place, and time. He appears well-developed and well-nourished.  unkempt  HENT:  Head: Normocephalic and atraumatic.  Eyes: Conjunctivae are normal. Pupils are equal, round, and reactive to light.  Neck: Neck supple. No tracheal deviation present. No thyromegaly present.  Cardiovascular: Normal rate and regular rhythm.   No  murmur heard. Pulmonary/Chest: Effort normal and breath sounds normal.  Abdominal: Soft. Bowel sounds are normal. He exhibits no distension. There is no tenderness.  Musculoskeletal: Normal range of motion. He exhibits no edema and no tenderness.  Neurological: He is alert and oriented to person, place, and time. No cranial nerve deficit. Coordination normal.  Gait normal  Skin: Skin is warm and dry. No rash noted.  Psychiatric: He has a normal mood and affect.    ED Course  Procedures (including critical care time) Labs Reviewed  CBC  ACETAMINOPHEN LEVEL  COMPREHENSIVE METABOLIC PANEL  ETHANOL  SALICYLATE LEVEL  URINE RAPID DRUG SCREEN (HOSP PERFORMED)   No results found. No diagnosis found. Results for orders placed during the hospital encounter of 04/14/13  ACETAMINOPHEN LEVEL      Result Value Range   Acetaminophen (Tylenol), Serum <15.0  10 - 30 ug/mL  CBC      Result Value Range   WBC 9.1  4.0 - 10.5 K/uL   RBC 4.74  4.22 - 5.81 MIL/uL   Hemoglobin 14.6  13.0 - 17.0 g/dL   HCT 40.9  81.1 - 91.4 %   MCV 90.1  78.0 - 100.0 fL   MCH 30.8  26.0 - 34.0 pg   MCHC 34.2  30.0 - 36.0 g/dL   RDW 78.2  95.6 - 21.3 %   Platelets 234  150 - 400 K/uL  COMPREHENSIVE METABOLIC PANEL      Result Value Range   Sodium 139  135 - 145 mEq/L  Potassium 3.7  3.5 - 5.1 mEq/L   Chloride 102  96 - 112 mEq/L   CO2 27  19 - 32 mEq/L   Glucose, Bld 85  70 - 99 mg/dL   BUN 8  6 - 23 mg/dL   Creatinine, Ser 1.61  0.50 - 1.35 mg/dL   Calcium 9.4  8.4 - 09.6 mg/dL   Total Protein 6.7  6.0 - 8.3 g/dL   Albumin 3.6  3.5 - 5.2 g/dL   AST 17  0 - 37 U/L   ALT 27  0 - 53 U/L   Alkaline Phosphatase 105  39 - 117 U/L   Total Bilirubin 0.3  0.3 - 1.2 mg/dL   GFR calc non Af Amer >90  >90 mL/min   GFR calc Af Amer >90  >90 mL/min  ETHANOL      Result Value Range   Alcohol, Ethyl (B) <11  0 - 11 mg/dL  SALICYLATE LEVEL      Result Value Range   Salicylate Lvl <2.0 (*) 2.8 - 20.0 mg/dL   No  results found. Results for orders placed during the hospital encounter of 04/14/13  ACETAMINOPHEN LEVEL      Result Value Range   Acetaminophen (Tylenol), Serum <15.0  10 - 30 ug/mL  CBC      Result Value Range   WBC 9.1  4.0 - 10.5 K/uL   RBC 4.74  4.22 - 5.81 MIL/uL   Hemoglobin 14.6  13.0 - 17.0 g/dL   HCT 04.5  40.9 - 81.1 %   MCV 90.1  78.0 - 100.0 fL   MCH 30.8  26.0 - 34.0 pg   MCHC 34.2  30.0 - 36.0 g/dL   RDW 91.4  78.2 - 95.6 %   Platelets 234  150 - 400 K/uL  COMPREHENSIVE METABOLIC PANEL      Result Value Range   Sodium 139  135 - 145 mEq/L   Potassium 3.7  3.5 - 5.1 mEq/L   Chloride 102  96 - 112 mEq/L   CO2 27  19 - 32 mEq/L   Glucose, Bld 85  70 - 99 mg/dL   BUN 8  6 - 23 mg/dL   Creatinine, Ser 2.13  0.50 - 1.35 mg/dL   Calcium 9.4  8.4 - 08.6 mg/dL   Total Protein 6.7  6.0 - 8.3 g/dL   Albumin 3.6  3.5 - 5.2 g/dL   AST 17  0 - 37 U/L   ALT 27  0 - 53 U/L   Alkaline Phosphatase 105  39 - 117 U/L   Total Bilirubin 0.3  0.3 - 1.2 mg/dL   GFR calc non Af Amer >90  >90 mL/min   GFR calc Af Amer >90  >90 mL/min  ETHANOL      Result Value Range   Alcohol, Ethyl (B) <11  0 - 11 mg/dL  SALICYLATE LEVEL      Result Value Range   Salicylate Lvl <2.0 (*) 2.8 - 20.0 mg/dL   No results found.   MDM  [facility] psychiatry consulted recommend inpatient psychiatric stay,geodon as needed Ativan as needed,  Dx #1 suicidal ideation   Doug Sou, MD 04/14/13 2314

## 2013-04-15 ENCOUNTER — Inpatient Hospital Stay (HOSPITAL_COMMUNITY)
Admission: AD | Admit: 2013-04-15 | Discharge: 2013-04-20 | DRG: 885 | Disposition: A | Discharge status: Home / Self Care | Payer: Medicare Other | Source: Ambulatory Visit | Attending: Psychiatry | Admitting: Psychiatry

## 2013-04-15 ENCOUNTER — Encounter (HOSPITAL_COMMUNITY): Payer: Self-pay | Admitting: *Deleted

## 2013-04-15 ENCOUNTER — Encounter (HOSPITAL_COMMUNITY): Payer: Self-pay | Admitting: Registered Nurse

## 2013-04-15 DIAGNOSIS — F203 Undifferentiated schizophrenia: Secondary | ICD-10-CM

## 2013-04-15 DIAGNOSIS — Z79899 Other long term (current) drug therapy: Secondary | ICD-10-CM

## 2013-04-15 DIAGNOSIS — F209 Schizophrenia, unspecified: Secondary | ICD-10-CM | POA: Diagnosis not present

## 2013-04-15 DIAGNOSIS — R45851 Suicidal ideations: Secondary | ICD-10-CM

## 2013-04-15 DIAGNOSIS — F259 Schizoaffective disorder, unspecified: Principal | ICD-10-CM | POA: Diagnosis present

## 2013-04-15 LAB — RAPID URINE DRUG SCREEN, HOSP PERFORMED
Barbiturates: NOT DETECTED
Benzodiazepines: NOT DETECTED

## 2013-04-15 MED ORDER — MAGNESIUM HYDROXIDE 400 MG/5ML PO SUSP: 30.0000 mL | Freq: Every day | ORAL | Status: DC | PRN

## 2013-04-15 MED ORDER — NICOTINE 21 MG/24HR TD PT24
21.0000 mg | MEDICATED_PATCH | Freq: Every day | TRANSDERMAL | Status: DC
  Filled 2013-04-15 (×8): qty 1

## 2013-04-15 MED ORDER — OLANZAPINE 5 MG PO TABS: 20.0000 mg | ORAL_TABLET | Freq: Every day | ORAL | Status: DC

## 2013-04-15 MED ORDER — ACETAMINOPHEN 325 MG PO TABS
650.0000 mg | ORAL_TABLET | Freq: Four times a day (QID) | ORAL | Status: DC | PRN
  Administered 2013-04-17 – 2013-04-19 (×4): 650 mg via ORAL

## 2013-04-15 MED ORDER — LORAZEPAM 1 MG PO TABS
1.0000 mg | ORAL_TABLET | Freq: Three times a day (TID) | ORAL | Status: DC | PRN
  Administered 2013-04-15 – 2013-04-19 (×8): 1 mg via ORAL
  Filled 2013-04-15 (×9): qty 1

## 2013-04-15 MED ORDER — OLANZAPINE 10 MG PO TABS
20.0000 mg | ORAL_TABLET | Freq: Every day | ORAL | Status: DC
  Administered 2013-04-15: 20 mg via ORAL
  Filled 2013-04-15 (×3): qty 2

## 2013-04-15 MED ORDER — ALUM & MAG HYDROXIDE-SIMETH 200-200-20 MG/5ML PO SUSP: 30.0000 mL | ORAL | Status: DC | PRN

## 2013-04-15 NOTE — Tx Team (Signed)
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  The patient and family have been given the opportunity to ask questions and make suggestions.  Rich Brave 04/15/2013, 6:28 PM

## 2013-04-15 NOTE — Progress Notes (Addendum)
Nrsg Admit note  [name] comes to [facility] from the psych ed at [facility], actively psychotic ( he is responding to internal stimuli, having private conversations with himself, laughing inappropriately, unable to carry on a conversation,etc). He is homeless, is unable to tell this nurse where he's been living or with whom. HE does admit to crack and cocaine use, as well as drinking alcohol. HE does not know hwne the last time he took psych meds is, as well as what those meds were  / are. He is impulsive, ie... He becamse distracted during assessment process and began to wlk away from this Clinical research associate....alll the while assessment questions being asked of him. He does know he is allergic to haldol ( 'it make me curl up"), he does say he has a payor ( " danielle little") and is able to say he is very hungry and wants dinner. HE does contact with this nurse for safety and addmisssion is completed.

## 2013-04-15 NOTE — Progress Notes (Signed)
Patient ID: [name], male   DOB: Jun 15, 1976, 37 y.o.   MRN: [id number]  D: Patient restless and agitated, bursting loudly into inappropriate laughter at odd times, appears to be responding to internal stimuli. Pt angry stating "That bitch has my money and I can't wait till my Dad dies". Pt labile and needing frequent redirection. A: Administer medications as ordered by MD, redirect as needed. R: Pt compliant with medications, but remains labile with outbursts of laughter without cause.

## 2013-04-15 NOTE — Consult Note (Signed)
Reason for Consult: Evaluation for inpatient treatment Referring Physician:  EDP  BERTHOLD [name] is an 37 y.o. male.  HPI: Patient presented to [facility] with suicidal ideations (SI) and plan to shoot himself in the head.  Patient states that 2 months ago he was hearing voices but not at this time.  Patient continues to be SI with plan.  Patient states that he has missed doses of his Zyprexa but unsure of how much.  Patient states does not elaborate on where he receives outpatient services or who prescribes his medications.  Patient appears to be confused, disorganized and inappropriate answers to questions.    Past Medical History  Diagnosis Date  . Schizophrenic disorder     History reviewed. No pertinent past surgical history.  History reviewed. No pertinent family history.  Social History:  reports that he has been smoking Cigarettes.  He has a 15 pack-year smoking history. He does not have any smokeless tobacco history on file. He reports that he uses illicit drugs (Cocaine). He reports that he does not drink alcohol.  Allergies:  Allergies  Allergen Reactions  . Haldol (Haloperidol Decanoate)     'I curl up"    Medications: I have reviewed the patient's current medications.  Results for orders placed during the hospital encounter of 04/14/13 (from the past 48 hour(s))  ACETAMINOPHEN LEVEL     Status: None   Collection Time    04/14/13  4:54 PM      Result Value Range   Acetaminophen (Tylenol), Serum <15.0  10 - 30 ug/mL   Comment:            THERAPEUTIC CONCENTRATIONS VARY     SIGNIFICANTLY. A RANGE OF 10-30     ug/mL MAY BE AN EFFECTIVE     CONCENTRATION FOR MANY PATIENTS.     HOWEVER, SOME ARE BEST TREATED     AT CONCENTRATIONS OUTSIDE THIS     RANGE.     ACETAMINOPHEN CONCENTRATIONS     >150 ug/mL AT 4 HOURS AFTER     INGESTION AND >50 ug/mL AT 12     HOURS AFTER INGESTION ARE     OFTEN ASSOCIATED WITH TOXIC     REACTIONS.  CBC     Status: None   Collection Time   04/14/13  4:54 PM      Result Value Range   WBC 9.1  4.0 - 10.5 K/uL   RBC 4.74  4.22 - 5.81 MIL/uL   Hemoglobin 14.6  13.0 - 17.0 g/dL   HCT 16.1  09.6 - 04.5 %   MCV 90.1  78.0 - 100.0 fL   MCH 30.8  26.0 - 34.0 pg   MCHC 34.2  30.0 - 36.0 g/dL   RDW 40.9  81.1 - 91.4 %   Platelets 234  150 - 400 K/uL  COMPREHENSIVE METABOLIC PANEL     Status: None   Collection Time    04/14/13  4:54 PM      Result Value Range   Sodium 139  135 - 145 mEq/L   Potassium 3.7  3.5 - 5.1 mEq/L   Chloride 102  96 - 112 mEq/L   CO2 27  19 - 32 mEq/L   Glucose, Bld 85  70 - 99 mg/dL   BUN 8  6 - 23 mg/dL   Creatinine, Ser 7.82  0.50 - 1.35 mg/dL   Calcium 9.4  8.4 - 95.6 mg/dL   Total Protein 6.7  6.0 - 8.3 g/dL  Albumin 3.6  3.5 - 5.2 g/dL   AST 17  0 - 37 U/L   ALT 27  0 - 53 U/L   Alkaline Phosphatase 105  39 - 117 U/L   Total Bilirubin 0.3  0.3 - 1.2 mg/dL   GFR calc non Af Amer >90  >90 mL/min   GFR calc Af Amer >90  >90 mL/min   Comment:            The eGFR has been calculated     using the CKD EPI equation.     This calculation has not been     validated in all clinical     situations.     eGFR's persistently     <90 mL/min signify     possible Chronic Kidney Disease.  ETHANOL     Status: None   Collection Time    04/14/13  4:54 PM      Result Value Range   Alcohol, Ethyl (B) <11  0 - 11 mg/dL   Comment:            LOWEST DETECTABLE LIMIT FOR     SERUM ALCOHOL IS 11 mg/dL     FOR MEDICAL PURPOSES ONLY  SALICYLATE LEVEL     Status: Abnormal   Collection Time    04/14/13  4:54 PM      Result Value Range   Salicylate Lvl <2.0 (*) 2.8 - 20.0 mg/dL  URINE RAPID DRUG SCREEN (HOSP PERFORMED)     Status: None   Collection Time    04/15/13  5:10 AM      Result Value Range   Opiates NONE DETECTED  NONE DETECTED   Cocaine NONE DETECTED  NONE DETECTED   Benzodiazepines NONE DETECTED  NONE DETECTED   Amphetamines NONE DETECTED  NONE DETECTED   Tetrahydrocannabinol NONE DETECTED   NONE DETECTED   Barbiturates NONE DETECTED  NONE DETECTED   Comment:            DRUG SCREEN FOR MEDICAL PURPOSES     ONLY.  IF CONFIRMATION IS NEEDED     FOR ANY PURPOSE, NOTIFY LAB     WITHIN 5 DAYS.                LOWEST DETECTABLE LIMITS     FOR URINE DRUG SCREEN     Drug Class       Cutoff (ng/mL)     Amphetamine      1000     Barbiturate      200     Benzodiazepine   200     Tricyclics       300     Opiates          300     Cocaine          300     THC              50    No results found.  Review of Systems  Psychiatric/Behavioral: Positive for depression, suicidal ideas (thoughts no plan at this time), memory loss and substance abuse. Hallucinations: History of hearing voices 2 months ago. The patient is nervous/anxious. The patient does not have insomnia.   All other systems reviewed and are negative.   Blood pressure 115/68, pulse 85, temperature 97.2 F (36.2 C), temperature source Oral, resp. rate 20, SpO2 96.00%. Physical Exam  Constitutional: He appears well-developed.  HENT:  Head: Normocephalic.  Neck: Normal range of motion.  Respiratory:  Effort normal.  Musculoskeletal: Normal range of motion.  Neurological: He is alert.  Skin: Skin is warm and dry.   Face to face interview an d consult with Dr. Lolly Mustache Assessment/Plan: Axis I: Schizophrenia Axis II: Deferred Axis III:  Past Medical History  Diagnosis Date  . Schizophrenic disorder    Axis IV: housing problems, occupational problems, other psychosocial or environmental problems, problems related to social environment and problems with primary support group Axis V: 11-20 some danger of hurting self or others possible OR occasionally fails to maintain minimal personal hygiene OR gross impairment in communication  Recommendation: In patient treatment.  Patient accepted to [facility] hall.   1. Admit for crisis management and stabilization.  2. Review and initiate  medications pertinent to patient  illness and treatment.  3. Medication management to reduce current symptoms to base line and improve the         patient's overall level of functioning.   Start Zyprexa 20 mg QHS    Rankin, Shuvon, FNP-BC 04/15/2013, 12:48 PM     I have personally seen the patient and agreed with the findings and involved in the treatment plan. Kathryne Sharper, MD

## 2013-04-15 NOTE — BH Assessment (Signed)
[facility] Assessment Progress Note  Marchia Bond, Assessment Counselor faxed notification that pt had been seen in consult and accepted to [facility] by Kathryne Sharper, MD.  Spoke to Berneice Heinrich, RN, Administrative Coordinator, who assigned pt to Rm 405-2 to the service of Thedore Mins, MD.  At 11:12 I called Terri to notify her.  Doylene Canning, MA Assessment Counselor 04/15/2013 @ 11:17

## 2013-04-15 NOTE — BH Assessment (Signed)
Assessment Note   [name] is an 37 y.o. male. Pt presents voluntarily to [facility]. Per ED notes, pt endorsed SI with plan to shoot himself in head. Pt is poor historian and doesn't answer the questions asked. He laughs loudly several times during assessment and appears to be responding to internal stimuli. When asked re: SI and plan to shoot himself, pt replies, "I just need medications". Writer asks several more times re: SI but he refuses to elaborate. Pt is delayed in his responses. Pt states he is homeless and lost his ID so he can't get into a shelter. Pt states he goes to day program at [facility] [phone] and that "they are stealing my $. Will you call them and tell them to get me a place to stay?". UDS not collected yet. Pt has been inpatient several times at [facility] between 2009 and 2013 with last admission Feb 2013 for schizophrenia. When asked re: mood, pt states, "It is a trip". Dr. Melida Gimenez telepsych consult recommends inpatient treatment.    Axis I: Schizophrenia Axis II: Deferred Axis III:  Past Medical History  Diagnosis Date  . Schizophrenic disorder    Axis IV: housing problems, occupational problems, problems related to social environment and problems with primary support group Axis V: 31-40 impairment in reality testing  Past Medical History:  Past Medical History  Diagnosis Date  . Schizophrenic disorder     History reviewed. No pertinent past surgical history.  Family History: History reviewed. No pertinent family history.  Social History:  reports that he has been smoking Cigarettes.  He has a 15 pack-year smoking history. He does not have any smokeless tobacco history on file. He reports that he uses illicit drugs (Cocaine). He reports that he does not drink alcohol.  Additional Social History:  Alcohol / Drug Use Pain Medications: see PTA meds list Prescriptions: see PTA meds list Over the Counter: see PTA meds list History of alcohol / drug  use?:  (UDS not collected yet)  CIWA: CIWA-Ar BP: 118/77 mmHg Pulse Rate: 84 COWS:    Allergies:  Allergies  Allergen Reactions  . Haldol (Haloperidol Decanoate)     'I curl up"    Home Medications:  (Not in a hospital admission)  OB/GYN Status:  No LMP for male patient.  General Assessment Data Location of Assessment: [facility] ED Living Arrangements: Other (Comment) (homeless) Can pt return to current living arrangement?: Yes Admission Status: Voluntary Is patient capable of signing voluntary admission?: Yes Transfer from: Acute Hospital Referral Source: Self/Family/Friend  Education Status Is patient currently in school?: No Current Grade: na Highest grade of school patient has completed: 10  Risk to self Suicidal Ideation:  (unable to assess - pt told EDP that he was SI) Suicidal Intent:  (unable to assess) Is patient at risk for suicide?: Yes Suicidal Plan?:  (unable to assess) Specify Current Suicidal Plan: pt told EDP he wanted to shoot himself in head Access to Means:  (unable to assess) What has been your use of drugs/alcohol within the last 12 months?: unable to assess - uds not collected yet Previous Attempts/Gestures: No How many times?: 0 Other Self Harm Risks: none Triggers for Past Attempts:  (n/a) Intentional Self Injurious Behavior:  (unable to assess) Family Suicide History: Unable to assess Recent stressful life event(s): Other (Comment) (homeless) Persecutory voices/beliefs?:  (UTA) Depression:  (UTA) Depression Symptoms:  (UTA) Substance abuse history and/or treatment for substance abuse?:  (UTA) Suicide prevention information given to  non-admitted patients: Not applicable  Risk to Others Homicidal Ideation:  (UTA) Thoughts of Harm to Others:  (UTA) Current Homicidal Intent:  (UTA) Current Homicidal Plan:  (UTA) Access to Homicidal Means:  (UTA) Identified Victim:  (UTA) History of harm to others?:  (UTA) Assessment of Violence: None  Noted Violent Behavior Description: pt calm while in psych ed Does patient have access to weapons?:  (unable to assess - per earlier assessmnts, pt has access to ) Criminal Charges Pending?: No  Psychosis Hallucinations:  (pt appears to be responding to internal stimuli) Delusions: None noted  Mental Status Report Appear/Hygiene: Disheveled;Poor hygiene;Body odor Eye Contact: Fair Motor Activity: Freedom of movement Speech: Loud;Logical/coherent Level of Consciousness: Alert Mood: Other (Comment) ("it is a trip") Affect: Euphoric Anxiety Level:  (UTA) Thought Processes: Circumstantial Judgement: Impaired Orientation: Person;Place;Situation Obsessive Compulsive Thoughts/Behaviors:  (UTA)  Cognitive Functioning Concentration:  (UTA) Memory:  (UTA) IQ: Average Insight: Poor Impulse Control: Poor Appetite: Good Weight Loss: 0 Weight Gain: 0 Sleep:  (UTA) Vegetative Symptoms:  (UTA)  ADLScreening [facility] Assessment Services) Patient's cognitive ability adequate to safely complete daily activities?: Yes Patient able to express need for assistance with ADLs?: Yes Independently performs ADLs?: Yes (appropriate for developmental age)  Abuse/Neglect [facility]) Physical Abuse: Denies Verbal Abuse: Denies Sexual Abuse: Denies  Prior Inpatient Therapy Prior Inpatient Therapy: Yes Prior Therapy Dates: 2009-2013 Prior Therapy Facilty/Provider(s): [facility] Reason for Treatment: schizophrenia, SI  Prior Outpatient Therapy Prior Outpatient Therapy: Yes Prior Therapy Dates: Current Prior Therapy Facilty/Provider(s): [facility] Reason for Treatment: ACTT team/Day Program  ADL Screening (condition at time of admission) Patient's cognitive ability adequate to safely complete daily activities?: Yes Is the patient deaf or have difficulty hearing?: No Does the patient have difficulty seeing, even when wearing glasses/contacts?: No Does the patient have difficulty concentrating,  remembering, or making decisions?: Yes Patient able to express need for assistance with ADLs?: Yes Does the patient have difficulty dressing or bathing?: No Independently performs ADLs?: Yes (appropriate for developmental age) Does the patient have difficulty walking or climbing stairs?: No Weakness of Legs: None Weakness of Arms/Hands: None  Home Assistive Devices/Equipment Home Assistive Devices/Equipment: None    Abuse/Neglect Assessment (Assessment to be complete while patient is alone) Physical Abuse: Denies Verbal Abuse: Denies Sexual Abuse: Denies Exploitation of patient/patient's resources: Denies Self-Neglect: Denies Values / Beliefs Cultural Requests During Hospitalization: None Spiritual Requests During Hospitalization: None   Advance Directives (For Healthcare) Advance Directive: Patient does not have advance directive;Patient would not like information    Additional Information 1:1 In Past 12 Months?: No CIRT Risk: No Elopement Risk: No Does patient have medical clearance?: Yes     Disposition:  Disposition Initial Assessment Completed for this Encounter: Yes Disposition of Patient: Inpatient treatment program;Other dispositions Type of inpatient treatment program: Adult Other disposition(s): To current provider Patient referred to: Other (Comment) ([facility])  On Site Evaluation by:   Reviewed with Physician:     Donnamarie Rossetti P 04/15/2013 5:38 AM

## 2013-04-16 DIAGNOSIS — F209 Schizophrenia, unspecified: Secondary | ICD-10-CM

## 2013-04-16 MED ORDER — OLANZAPINE 7.5 MG PO TABS
15.0000 mg | ORAL_TABLET | Freq: Every day | ORAL | Status: DC
  Administered 2013-04-16 – 2013-04-19 (×4): 15 mg via ORAL
  Filled 2013-04-16 (×4): qty 2
  Filled 2013-04-16: qty 4
  Filled 2013-04-16: qty 2

## 2013-04-16 NOTE — Progress Notes (Signed)
D: Pt is currently denying any SI/HI/AVH. However, this writer can conclude that this pt maybe responding to internal stimuli. He continues to laugh at inappropriate times. He was initially observed pacing the hallways. He verbalizes no need to be here. A: Writer administered scheduled and prn medications to pt. Continued support and availability as needed was extended to this pt. Staff continue to monitor pt with q22min checks.  R: No adverse drug reactions noted. Pt receptive to treatment. Pt remains safe at this time.

## 2013-04-16 NOTE — Progress Notes (Signed)
[facility] Group Notes:  (Nursing/MHT/Case Management/Adjunct)  Date:  04/15/2013 Time:  2000  Type of Therapy:  Psychoeducational Skills  Participation Level:  Minimal  Participation Quality:  Intrusive, Inattentive and Redirectable  Affect:  Excited  Cognitive:  Lacking  Insight:  Lacking  Engagement in Group:  Off Topic  Modes of Intervention:  Education  Summary of Progress/Problems: The patient had to be redirected for laughing inappropriately and for talking out of turn. He would only share with the group that he didn't feel that he needed to be hospitalized and that he wanted to speak with the case manager regarding his housing.   Allea Kassner S 04/16/2013, 12:06 AM

## 2013-04-16 NOTE — [facility] Group Notes (Signed)
[facility] LCSW Group Therapy  04/16/2013 1:15 pm  Type of Therapy: Process Group Therapy  Participation Level:  [name] stated that he is not suicidal, that he wants to be released to [place], and then got up and left group.    Summary of Progress/Problems: Today's group addressed the issue of overcoming obstacles.  Patients were asked to identify their biggest obstacle post d/c that stands in the way of their on-going success, and then problem solve as to how to manage this.  [name] B 04/16/2013   3:52 PM

## 2013-04-16 NOTE — [facility] Suicide Risk Assessment (Signed)
Suicide Risk Assessment  Admission Assessment     Nursing information obtained from:  Patient Demographic factors:  Male;Caucasian;Low socioeconomic status;Living alone;Unemployed Current Mental Status:  NA Loss Factors:   (pt poor historian due to active hallucinations) Historical Factors:  Impulsivity Risk Reduction Factors:     CLINICAL FACTORS:   Severe Anxiety and/or Agitation Schizophrenia:   Paranoid or undifferentiated type Currently Psychotic Previous Psychiatric Diagnoses and Treatments  COGNITIVE FEATURES THAT CONTRIBUTE TO RISK:  Closed-mindedness Polarized thinking    SUICIDE RISK:   Minimal: No identifiable suicidal ideation.  Patients presenting with no risk factors but with morbid ruminations; may be classified as minimal risk based on the severity of the depressive symptoms  PLAN OF CARE:1. Admit for crisis management and stabilization. 2. Medication management to reduce current symptoms to base line and improve the     patient's overall level of functioning 3. Treat health problems as indicated. 4. Develop treatment plan to decrease risk of relapse upon discharge and the need for     readmission. 5. Psycho-social education regarding relapse prevention and self care. 6. Health care follow up as needed for medical problems. 7. Restart home medications where appropriate.   I certify that inpatient services furnished can reasonably be expected to improve the patient's condition.  Marquise Wicke,MD 04/16/2013, 11:06 AM

## 2013-04-16 NOTE — [facility] Group Notes (Signed)
[facility] LCSW Aftercare Discharge Planning Group Note   04/16/2013 8:27 AM  Participation Quality:  Did not attend    [place]
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
## 2013-04-16 NOTE — Progress Notes (Signed)
Recreation Therapy Notes  Date: 07.21.2014 Time: 9:30am Location: [address] Dayroom      Group Topic/Focus: Leisure Education  Activity Participation Level: Did not attend   [employer], LRT/CTRS  Jearl Klinefelter 04/16/2013 12:36 PM

## 2013-04-16 NOTE — Progress Notes (Signed)
D: Pt presents labile, cursing at staff, pacing in hallway, agitated and irritable. No insight. Disorganized thought. Pt responding to internal stimuli, inappropriate laughing to self.  Pt denies SI/HI/AVH. Pt continues to need redirecting by staff for inappropriate behaviors. A: Medications administered as ordered per MD. Lorenda Peck support given. Pt encouraged to attend groups. 15 minute checks performed for safety. R: Pt anxious. Pt remains safe.

## 2013-04-16 NOTE — Progress Notes (Signed)
The focus of this group is to help patients review their daily goal of treatment and discuss progress on daily workbooks. Pt attended the evening group session but was unable to respond on-topic to discussion prompts from the Writer. Pt presented with disorganized thoughts and spoke across a number of unrelated topics that included "I won't go to any group homes because they're against religion," and "You're all going to hell for what you're doing to me." Pt also broke into loud laughter at inappropriate times during group. Pt appeared to be preoccupied and responding to internal stimuli.

## 2013-04-17 MED ORDER — DIVALPROEX SODIUM ER 500 MG PO TB24
500.0000 mg | ORAL_TABLET | Freq: Two times a day (BID) | ORAL | Status: DC
  Administered 2013-04-17 – 2013-04-20 (×7): 500 mg via ORAL
  Filled 2013-04-17 (×2): qty 1
  Filled 2013-04-17: qty 4
  Filled 2013-04-17 (×6): qty 1
  Filled 2013-04-17: qty 4
  Filled 2013-04-17: qty 1

## 2013-04-17 NOTE — [facility] Group Notes (Signed)
[facility] LCSW Group Therapy  04/17/2013 12:38 PM   Type of Therapy:  Group Therapy  Participation Level:  [name] abruptly entered and exited the group room multiple times over the course of the group.  While there, he laughed inappropriately, and when invited to join in the conversation would only repeat the theme of "I am not suicidal and can I be released today?"    Summary of Progress/Problems: Today's group focused on relapse prevention.  We defined the term, and then brainstormed on ways to prevent relapse.  [name] 04/17/2013 , 12:38 PM

## 2013-04-17 NOTE — Tx Team (Signed)
  Interdisciplinary Treatment Plan Update   Date Reviewed:  04/17/2013  Time Reviewed:  8:26 AM  Progress in Treatment:   Attending groups: No Participating in groups: No Taking medication as prescribed: Yes  Tolerating medication: Yes Family/Significant other contact made: Yes  Patient understands diagnosis: No  Limited insight, though he does admit that he needs medication for "confusing thoughts" Discussing patient identified problems/goals with staff: Yes  See initial plan Medical problems stabilized or resolved: Yes Denies suicidal/homicidal ideation: Yes  In tx team Patient has not harmed self or others: Yes  For review of initial/current patient goals, please see plan of care.  Estimated Length of Stay:  4-5 days  Reason for Continuation of Hospitalization: Hallucinations Medication stabilization  New Problems/Goals identified:  N/A  Discharge Plan or Barriers:   return home, follow up outpt  Additional Comments:   [name] is an 37 y.o. male. Pt presents voluntarily to [facility]. Per ED notes, pt endorsed SI with plan to shoot himself in head. Pt is poor historian and does not answer all questions asked. Patient only wanting to know about "Can you call my case manager to get my money?" When told that the reasons for his admission were to be discussed at this time stated "I was suicidal but really I was just hungry because I had no money. Can I just go?" The patient then got up abruptly stating "I'm ending this meeting" and appeared mildly agitated. When writer attempted to speak with him again the patient answered several basic questions then again got up and left. The patient provided very limited information but does seem to be internally preoccupied.   Attendees:  Signature: Thedore Mins, MD 04/17/2013 8:26 AM   Signature: Richelle Ito, LCSW 04/17/2013 8:26 AM  Signature: Fransisca Kaufmann, NP 04/17/2013 8:26 AM  Signature: Joslyn Devon, RN 04/17/2013 8:26 AM  Signature:  Liborio Nixon, RN 04/17/2013 8:26 AM  Signature:  04/17/2013 8:26 AM  Signature:   04/17/2013 8:26 AM  Signature:    Signature:    Signature:    Signature:    Signature:    Signature:      Scribe for Treatment Team:   Richelle Ito, LCSW  04/17/2013 8:26 AM

## 2013-04-17 NOTE — [facility] Counselor (Signed)
Adult Comprehensive Assessment  Patient ID: JABEN BENEGAS, male   DOB: 11-22-1975, 37 y.o.   MRN: [id number]  Information Source: Information source: Patient  Current Stressors:  Educational / Learning stressors: N/A Employment / Job issues: Yes  Occupational Family Relationships: N/A Surveyor, quantity / Lack of resources (include bankruptcy): Yes  Fixed income Housing / Lack of housing: N/A Physical health (include injuries & life threatening diseases): N/A Social relationships: Yes  Few positive Substance abuse: Yes  Crack cocaine Bereavement / Loss: N/A  Living/Environment/Situation:  Living Arrangements: Alone Living conditions (as described by patient or guardian): Fair How long has patient lived in current situation?: Several months at [employer] estended stay hotel What is atmosphere in current home: Temporary;Chaotic  Family History:  Marital status: Single Does patient have children?: No  Childhood History:  By whom was/is the patient raised?: Both parents Description of patient's relationship with caregiver when they were a child: good Patient's description of current relationship with people who raised him/her: good Does patient have siblings?: Yes Number of Siblings: 3 Description of patient's current relationship with siblings: good with sister, none with brothers Did patient suffer any verbal/emotional/physical/sexual abuse as a child?: No Did patient suffer from severe childhood neglect?: No Has patient ever been sexually abused/assaulted/raped as an adolescent or adult?: No Was the patient ever a victim of a crime or a disaster?: No Witnessed domestic violence?: No Has patient been effected by domestic violence as an adult?: No  Education:     Employment/Work Situation:   Employment situation: On disability Why is patient on disability: mental health How long has patient been on disability: "I don't remember" Patient's job has been impacted by current  illness: No What is the longest time patient has a held a job?: N/A Where was the patient employed at that time?: N/A Has patient ever been in the [employer]?: No Has patient ever served in Buyer, retail?: No  Financial Resources:   Financial resources: Insurance claims handler Does patient have a Lawyer or guardian?: Yes Name of representative payee or guardian: [facility]  Alcohol/Substance Abuse:   What has been your use of drugs/alcohol within the last 12 months?: Crack cocaine regularly for the past 2-3 weeks Alcohol/Substance Abuse Treatment Hx: Past Tx, Outpatient If yes, describe treatment: [facility] Has alcohol/substance abuse ever caused legal problems?: No  Social Support System:   Conservation officer, nature Support System: Good Describe Community Support System: girlfriend and formal supports Type of faith/religion: N/A How does patient's faith help to cope with current illness?: N/A  Leisure/Recreation:   Leisure and Hobbies: hang out  Strengths/Needs:   What things does the patient do well?: not sure In what areas does patient struggle / problems for patient: not sure  Discharge Plan:   Does patient have access to transportation?: Yes Will patient be returning to same living situation after discharge?: Yes Currently receiving community mental health services: Yes (From Whom) ([facility]/Dr Betti Cruz) Does patient have financial barriers related to discharge medications?: No  Summary/Recommendations:   Summary and Recommendations (to be completed by the evaluator): Vinson is a 37 YO Caucasian male here again for symptoms related to psychosis and substance abuse.  He is paranoid, disorganized, laughs out loud and comes and goes abruptlly.  All these were on display in our interview.  He can beneift from crises stabilization, medication managment, therapeutic milieu and coordination with [facility].  [name]. 04/17/2013

## 2013-04-17 NOTE — Progress Notes (Signed)
Adult Psychoeducational Group Note  Date:  04/17/2013 Time:  10:43 PM  Group Topic/Focus:  Goals Group:   The focus of this group is to help patients establish daily goals to achieve during treatment and discuss how the patient can incorporate goal setting into their daily lives to aide in recovery.  Participation Level:  Active  Participation Quality:  Appropriate  Affect:  Appropriate  Cognitive:  Appropriate  Insight: Appropriate  Engagement in Group:  Engaged  Modes of Intervention:  Discussion  Additional Comments:  Pt stated that his day was ok and went well. Pt stated that he can't wait to be discharged.  Aldona Lento 04/17/2013, 10:43 PM

## 2013-04-17 NOTE — Progress Notes (Signed)
D: Pt presents with animated affect, rapid pressured speech an is anxious. Pt continues to pace in the hallway and dayroom. Pt easily agitated an continues to need redirecting by staff for inappropriate behaviors. Pt verbally aggressive at times throughout the day. Pt mood is up and down. Pt can be cooperative and pleasant at times. Pt is responding to internal stimuli, has inappropriate laughing. Pt seen several times in hallway laughing loudly to himself. A: Medications administered as ordered per MD. New medications Depakote stated today. Pt educated on medication action and adverse reactions. Verbal support given. Pt encouraged to attend groups. 15 minute checks performed for safety. R: Pt has no insight and verbalized "I'm not crazy.. I don't need to be here". Pt remains safe at this time.

## 2013-04-17 NOTE — Progress Notes (Signed)
Patient ID: [name], male   DOB: [birth year], 37 y.o.   MRN: [id number]  D: Pt denies SI/HI/AVH. Pt was in bed upon first approach of the evening. Pt was agitated and  Kept stating he was ready to go home. Pt stormed out of his room and stated "leave me the F%$# alone". Pt seemed to calm down later, and was observed in the dayroom watching TV. Pt did state his family has not been supportive. Pt was obsessed with leaving the facility and stating he did not belong here.   A: Pt was offered support and encouragement. Pt was given scheduled medications. Pt was encourage to attend groups. Q 15 minute checks were done for safety.    R:Pt attends groups and interacts well with peers and staff. Pt is taking medication.Pt receptive to treatment and safety maintained on unit.

## 2013-04-17 NOTE — Progress Notes (Signed)
[facility] MD Progress Note  04/17/2013 2:17 PM [name]  MRN:  [id number] Subjective:   Patient reports sleeping and eating fine. Upon further assessment patient intensely says "I don't need to be here. I want to go home. Why do they hold me here." The patient walked away from Clinical research associate. He went into group then came out stating to several staff "If it weren't for me you all would not even be breathing." then angrily walked down the hall.   Objective:  Patient observed walking in and out of group. He remains easily agitated, psychotic and with a very labile mood. The patient can not meaningfully engage in conversation with staff at this time and has no insight into his symptoms.    Diagnosis:   Axis I: Schizophrenia Axis II: Deferred Axis III:  Past Medical History  Diagnosis Date  . Schizophrenic disorder    Axis IV: housing problems, occupational problems, problems related to social environment and problems with primary support group Axis V: 41-50 serious symptoms  ADL's:  Intact  Sleep: Good  Appetite:  Good  Suicidal Ideation:  Denies Homicidal Ideation:  Denies AEB (as evidenced by):  Psychiatric Specialty Exam: Review of Systems  Constitutional: Negative.   HENT: Negative.   Eyes: Negative.   Respiratory: Negative.   Cardiovascular: Negative.   Gastrointestinal: Negative.   Genitourinary: Negative.   Musculoskeletal: Negative.   Skin: Negative.   Neurological: Negative.   Endo/Heme/Allergies: Negative.   Psychiatric/Behavioral: Positive for hallucinations. Negative for depression, suicidal ideas, memory loss and substance abuse. The patient is nervous/anxious. The patient does not have insomnia.     Blood pressure 138/96, pulse 117, temperature 97 F (36.1 C), temperature source Oral, resp. rate 18, height 5' 11.5" (1.816 m), weight 86.183 kg (190 lb), SpO2 99.00%.Body mass index is 26.13 kg/(m^2).  General Appearance: Disheveled  Eye Contact::  Poor  Speech:   Pressured  Volume:  Increased  Mood:  Angry and Irritable  Affect:  Labile  Thought Process:  Irrelevant  Orientation:  Full (Time, Place, and Person)  Thought Content:  Obsessions  Suicidal Thoughts:  No  Homicidal Thoughts:  No  Memory:  Immediate;   Fair Recent;   Fair Remote;   Fair  Judgement:  Poor  Insight:  Lacking  Psychomotor Activity:  Increased  Concentration:  Poor  Recall:  Poor  Akathisia:  No  Handed:  Right  AIMS (if indicated):     Assets:  Communication Skills Desire for Improvement Leisure Time Physical Health  Sleep:  Number of Hours: 5.75   Current Medications: Current Facility-Administered Medications  Medication Dose Route Frequency Provider Last Rate Last Dose  . acetaminophen (TYLENOL) tablet 650 mg  650 mg Oral Q6H PRN Shuvon Rankin, NP   650 mg at 04/17/13 1411  . alum & mag hydroxide-simeth (MAALOX/MYLANTA) 200-200-20 MG/5ML suspension 30 mL  30 mL Oral Q4H PRN Shuvon Rankin, NP      . divalproex (DEPAKOTE ER) 24 hr tablet 500 mg  500 mg Oral BID AC Fransisca Kaufmann, NP   500 mg at 04/17/13 1147  . LORazepam (ATIVAN) tablet 1 mg  1 mg Oral Q8H PRN Shuvon Rankin, NP   1 mg at 04/17/13 0747  . magnesium hydroxide (MILK OF MAGNESIA) suspension 30 mL  30 mL Oral Daily PRN Shuvon Rankin, NP      . nicotine (NICODERM CQ - dosed in mg/24 hours) patch 21 mg  21 mg Transdermal Daily Shuvon Rankin, NP      .  OLANZapine (ZYPREXA) tablet 15 mg  15 mg Oral QHS Mojeed Akintayo   15 mg at 04/16/13 2118    Lab Results: No results found for this or any previous visit (from the past 48 hour(s)).  Physical Findings: AIMS: Facial and Oral Movements Muscles of Facial Expression: None, normal Lips and Perioral Area: None, normal Jaw: None, normal Tongue: None, normal,Extremity Movements Upper (arms, wrists, hands, fingers): None, normal Lower (legs, knees, ankles, toes): None, normal, Trunk Movements Neck, shoulders, hips: None, normal, Overall Severity Severity of  abnormal movements (highest score from questions above): None, normal Incapacitation due to abnormal movements: None, normal Patient's awareness of abnormal movements (rate only patient's report): No Awareness, Dental Status Current problems with teeth and/or dentures?: No Does patient usually wear dentures?: No  CIWA:  CIWA-Ar Total: 5 COWS:  COWS Total Score: 0  Treatment Plan Summary: Daily contact with patient to assess and evaluate symptoms and progress in treatment Medication management  Plan: Continue crisis management and stabilization.  Medication management: Continue Zyprexa 15 mg at hs for psychosis. Add Depakote 500 mg ER to improve stability of mood.  Encouraged patient to attend groups and participate in group counseling sessions and activities.  Discharge plan in progress.  Address health issues: Vitals reviewed and stable.  Continue current treatment plan.   Medical Decision Making Problem Points:  Review of psycho-social stressors (1) Data Points:  Review of medication regiment & side effects (2)  I certify that inpatient services furnished can reasonably be expected to improve the patient's condition.   Jalyah Weinheimer NP-C 04/17/2013, 2:17 PM

## 2013-04-18 DIAGNOSIS — F259 Schizoaffective disorder, unspecified: Principal | ICD-10-CM

## 2013-04-18 NOTE — Progress Notes (Signed)
D: Pt denies SI/HI/AVH. Pt presents with rapid pressured speech, disorganized thoughts, easily agitated and mild confusion. Pt continues to pace hallway and dayroom. Pt has inappropriate laughing an appears to be responding to internal stimuli. Pt compliant with taking meds. A: Medications administered as ordered per MD. Verbal support given. Pt encouraged to attend groups. 15 minute checks performed for safety. R: Pt anxious. No insight. Agitated.

## 2013-04-18 NOTE — [facility] Group Notes (Signed)
[facility] LCSW Aftercare Discharge Planning Group Note   04/18/2013 11:17 AM  Participation Quality:  Engaged  Mood/Affect:  Flat  Depression Rating:  denies  Anxiety Rating:  denies  Thoughts of Suicide:  No Will you contract for safety?   NA  Current AVH:  No  Plan for Discharge/Comments:  [name] is still laughing inappropriately, but also reached out to another patient who was feeling hopeless, demonstrating empathy.  And then immediately after making an empathetic statement, turned to me and stated "I'm not suicidal, I don't need to be here, when can I go home?"  Dr came in a bit later and let [name] know he would be leaving [place] on Friday.  [name] was satisfied with this answer.  Transportation Means: Case manager  Supports: Case manager  [place], Stewardson B

## 2013-04-18 NOTE — Progress Notes (Addendum)
D: Pt is appropriate in affect and anxious in mood. Pt is denying any SI/HI/AVH. Pt is also denying any other psychosocial symptoms.  Pt is observed interacting appropriately within the milieu. Pt observed laughing at  Inappropriate times. Pt is responding to internal stimuli.  He is verbalizing readiness to go home on Friday. He is still denying the need for his overall admission here. He denies the need for placement in a group home or assisted living facility. He plans on finding a job upon discharge. Right now he is focused on receiving another copy of his identification card from [facility].   A: Writer administered scheduled medications to pt. Continued support and availability as needed was extended to this pt. Staff continue to monitor pt with q35min checks.  R: No adverse drug reactions noted. Pt receptive to treatment. Pt remains safe at this time.

## 2013-04-18 NOTE — Progress Notes (Signed)
Recreation Therapy Notes   Date: 07.23.2014 Time: 9:30am Location: [address] Dayroom      Group Topic/Focus: Goal Setting  Participation Level: Minimal  Participation Quality: Appropriate  Affect: Flat  Cognitive: Hallucinating   Additional Comments: Activity: Goal Sheet ; Explanation: Patients were asked to identify two goals and the dates in which they would like to accomplish goals by.  Patient attended group session, but did not participate in activity. Patient looked up and to the right as if he was listening to someone. Patient would then look away and start laughing. Patient made no statements during session. At approximately 9:40am patient stood and exited space, patient returned quickly and laid down on one of the sofa's in the day room. Patient did not interact with peers.   Marykay Lex Rosette Bellavance, LRT/CTRS  Jearl Klinefelter 04/18/2013 5:36 PM

## 2013-04-18 NOTE — [facility] Group Notes (Signed)
[facility] Group Notes:  (Counselor/Nursing/MHT/Case Management/Adjunct)  04/18/2013 1:15PM  Type of Therapy:  Group Therapy  Participation Level:  Did not attend    Summary of Progress/Problems: The topic for group was balance in life.  Pt participated in the discussion about when their life was in balance and out of balance and how this feels.  Pt discussed ways to get back in balance and short term goals they can work on to get where they want to be.    [name] 04/18/2013 2:23 PM

## 2013-04-18 NOTE — Progress Notes (Signed)
The focus of this group is to help patients review their daily goal of treatment and discuss progress on daily workbooks. Pt attended the evening group session but was unable to respond on-topic to any discussion prompts from the Writer. Pt spoke on a number of unrelated topics during group and was unable to answer even the most direct question from the Writer. At one point, Pt began using offensive language and had to be redirected. Pt also required redirection from speaking over a peer when it was their turn to speak. Pt's affect was neutral.

## 2013-04-18 NOTE — Progress Notes (Signed)
Adult Psychoeducational Group Note  Date:  04/18/2013 Time:  11:00am Group Topic/Focus:  Personal Choices and Values:   The focus of this group is to help patients assess and explore the importance of values in their lives, how their values affect their decisions, how they express their values and what opposes their expression.  Participation Level:  Minimal  Participation Quality:  Redirectable  Affect:  Anxious  Cognitive:  Disorganized  Insight: Limited  Engagement in Group:  Limited  Modes of Intervention:  Discussion and Education  Additional Comments:  Pt attended and participated in group but it was very limited. Pt appeared to be anxious walking and not sitting still. Pt did however participate when ask what long term goal he wants for himself. Pt stated I want to get out of here, I want to get a job,learn how to walk away and find transportation to get back and fourth to work.  Shelly Bombard D 04/18/2013, 2:03 PM

## 2013-04-18 NOTE — Progress Notes (Signed)
Pt was brought back onto the unit for inappropriate behaviors and agitation. Pt told writer that he no longer needs to be here because there is nothing wrong with him. Pt is paranoid and believes that people are talking about him. Rec therapist reassured writer that no one was bothering the pt. Pt is responding to internal stimuli. Pt reported that he hear people telling him that he stinks. Pt remains safe at this time. Pt in hallway pacing.

## 2013-04-18 NOTE — Progress Notes (Signed)
Patient ID: [name], male   DOB: [birth year], 37 y.o.   MRN: [id number] [facility] MD Progress Note  04/18/2013 10:51 AM [name]  MRN:  [id number] Subjective:   "I don't need to be here. I want to go home. Why do they hold me here."  Objective: Patient remains easily agitated, labile , paranoid and has difficulty understanding while he is kept in the hospital. His thought process remains disorganized. He is fidgety, intrusive, restless and unable to sit still. He has poor insight and showing poor judgment. He is unable to engage in any meaningful conversation with staff. However, he is compliant with his medications.  Diagnosis:   Axis I: Schizoaffective disorder Axis II: Deferred Axis III:  Past Medical History  Diagnosis Date  . Schizophrenic disorder    Axis IV: housing problems, occupational problems, problems related to social environment and problems with primary support group Axis V: 41-50 serious symptoms  ADL's:  Intact  Sleep: Good  Appetite:  Good  Suicidal Ideation:  Denies Homicidal Ideation:  Denies AEB (as evidenced by):  Psychiatric Specialty Exam: Review of Systems  Constitutional: Negative.   HENT: Negative.   Eyes: Negative.   Respiratory: Negative.   Cardiovascular: Negative.   Gastrointestinal: Negative.   Genitourinary: Negative.   Musculoskeletal: Negative.   Skin: Negative.   Neurological: Negative.   Endo/Heme/Allergies: Negative.   Psychiatric/Behavioral: Positive for hallucinations. Negative for depression, suicidal ideas, memory loss and substance abuse. The patient is nervous/anxious. The patient does not have insomnia.     Blood pressure 123/86, pulse 121, temperature 97.6 F (36.4 C), temperature source Oral, resp. rate 22, height 5' 11.5" (1.816 m), weight 86.183 kg (190 lb), SpO2 99.00%.Body mass index is 26.13 kg/(m^2).  General Appearance: Disheveled  Eye Contact::  Poor  Speech:  Pressured  Volume:  Increased  Mood:  Angry and  Irritable  Affect:  Labile  Thought Process:  Irrelevant  Orientation:  Full (Time, Place, and Person)  Thought Content:  Obsessions  Suicidal Thoughts:  No  Homicidal Thoughts:  No  Memory:  Immediate;   Fair Recent;   Fair Remote;   Fair  Judgement:  Poor  Insight:  Lacking  Psychomotor Activity:  Increased  Concentration:  Poor  Recall:  Poor  Akathisia:  No  Handed:  Right  AIMS (if indicated):     Assets:  Communication Skills Desire for Improvement Leisure Time Physical Health  Sleep:  Number of Hours: 6.75   Current Medications: Current Facility-Administered Medications  Medication Dose Route Frequency Provider Last Rate Last Dose  . acetaminophen (TYLENOL) tablet 650 mg  650 mg Oral Q6H PRN Shuvon Rankin, NP   650 mg at 04/17/13 2230  . alum & mag hydroxide-simeth (MAALOX/MYLANTA) 200-200-20 MG/5ML suspension 30 mL  30 mL Oral Q4H PRN Shuvon Rankin, NP      . divalproex (DEPAKOTE ER) 24 hr tablet 500 mg  500 mg Oral BID AC Fransisca Kaufmann, NP   500 mg at 04/18/13 0701  . LORazepam (ATIVAN) tablet 1 mg  1 mg Oral Q8H PRN Shuvon Rankin, NP   1 mg at 04/18/13 0756  . magnesium hydroxide (MILK OF MAGNESIA) suspension 30 mL  30 mL Oral Daily PRN Shuvon Rankin, NP      . nicotine (NICODERM CQ - dosed in mg/24 hours) patch 21 mg  21 mg Transdermal Daily Shuvon Rankin, NP      . OLANZapine (ZYPREXA) tablet 15 mg  15 mg Oral QHS Jaimarie Rapozo  15 mg at 04/17/13 2141    Lab Results: No results found for this or any previous visit (from the past 48 hour(s)).  Physical Findings: AIMS: Facial and Oral Movements Muscles of Facial Expression: None, normal Lips and Perioral Area: None, normal Jaw: None, normal Tongue: None, normal,Extremity Movements Upper (arms, wrists, hands, fingers): None, normal Lower (legs, knees, ankles, toes): None, normal, Trunk Movements Neck, shoulders, hips: None, normal, Overall Severity Severity of abnormal movements (highest score from questions  above): None, normal Incapacitation due to abnormal movements: None, normal Patient's awareness of abnormal movements (rate only patient's report): No Awareness, Dental Status Current problems with teeth and/or dentures?: No Does patient usually wear dentures?: No  CIWA:  CIWA-Ar Total: 5 COWS:  COWS Total Score: 0  Treatment Plan Summary: Daily contact with patient to assess and evaluate symptoms and progress in treatment Medication management  Plan: Continue crisis management and stabilization.  Medication management: Continue Zyprexa 15 mg at hs for psychosis. Continue Depakote 500 mg ER BID for mood stabilization.  Encouraged patient to attend groups and participate in group counseling sessions and activities.  Discharge plan in progress.  Address health issues: Vitals reviewed and stable.  Continue current treatment plan.   Medical Decision Making Problem Points:  Review of psycho-social stressors (1) Data Points:  Review of medication regiment & side effects (2)  I certify that inpatient services furnished can reasonably be expected to improve the patient's condition.   Thedore Mins, MD 04/18/2013, 10:51 AM

## 2013-04-19 DIAGNOSIS — F259 Schizoaffective disorder, unspecified: Secondary | ICD-10-CM

## 2013-04-19 MED ORDER — RISPERIDONE 1 MG PO TABS
1.0000 mg | ORAL_TABLET | Freq: Every day | ORAL | Status: DC
  Administered 2013-04-19: 1 mg via ORAL
  Filled 2013-04-19 (×2): qty 1

## 2013-04-19 MED ORDER — LORAZEPAM 0.5 MG PO TABS
0.5000 mg | ORAL_TABLET | Freq: Two times a day (BID) | ORAL | Status: DC | PRN
  Administered 2013-04-19: 0.5 mg via ORAL
  Filled 2013-04-19: qty 1

## 2013-04-19 NOTE — Progress Notes (Signed)
Patient ID: [name], male   DOB: 1975-12-15, 37 y.o.   MRN: [id number] D: Pt. Lying in bed, eyes closed. Even. A: Writer noted resp. Even, unlabored. Staff will monitor q10min for safety. R: No distress noted. Pt. Is safe on the unit.

## 2013-04-19 NOTE — Progress Notes (Signed)
Patient ID: [name], male   DOB: October 20, 1975, 37 y.o.   MRN: [id number]   [facility] MD Progress Note  04/19/2013 1:04 PM [name]  MRN:  [id number] Subjective:    Patient states "I'm doing pretty good. I need to be more laid back. Am I going to get a shot for my nerves?"  Objective:  Patient is observed walking around the unit smiling and laughing to himself randomly. The patient's mood remains labile and two days in a row he has had to come back to the unit early from recreation due to agitation. [name] is able to more easily carry on a brief conversation with Clinical research associate and did not walk away today until the end of conversation.   Diagnosis:   Axis I: Schizoaffective disorder Axis II: Deferred Axis III:  Past Medical History  Diagnosis Date  . Schizophrenic disorder    Axis IV: housing problems, occupational problems, problems related to social environment and problems with primary support group Axis V: 41-50 serious symptoms  ADL's:  Intact  Sleep: Good  Appetite:  Good  Suicidal Ideation:  Denies Homicidal Ideation:  Denies AEB (as evidenced by):  Psychiatric Specialty Exam: Review of Systems  Constitutional: Negative.   HENT: Negative.   Eyes: Negative.   Respiratory: Negative.   Cardiovascular: Negative.   Gastrointestinal: Negative.   Genitourinary: Negative.   Musculoskeletal: Negative.   Skin: Negative.   Neurological: Negative.   Endo/Heme/Allergies: Negative.   Psychiatric/Behavioral: Positive for hallucinations. Negative for depression, suicidal ideas, memory loss and substance abuse. The patient is nervous/anxious. The patient does not have insomnia.     Blood pressure 140/88, pulse 116, temperature 97 F (36.1 C), temperature source Oral, resp. rate 22, height 5' 11.5" (1.816 m), weight 86.183 kg (190 lb), SpO2 99.00%.Body mass index is 26.13 kg/(m^2).  General Appearance: Disheveled  Eye Solicitor::  Poor  Speech:  Pressured  Volume:  Increased  Mood:   Anxious  Affect:  Labile  Thought Process:  Irrelevant  Orientation:  Full (Time, Place, and Person)  Thought Content:  Obsessions  Suicidal Thoughts:  No  Homicidal Thoughts:  No  Memory:  Immediate;   Fair Recent;   Fair Remote;   Fair  Judgement:  Poor  Insight:  Lacking  Psychomotor Activity:  Increased  Concentration:  Poor  Recall:  Poor  Akathisia:  No  Handed:  Right  AIMS (if indicated):     Assets:  Communication Skills Desire for Improvement Leisure Time Physical Health  Sleep:  Number of Hours: 6.5   Current Medications: Current Facility-Administered Medications  Medication Dose Route Frequency Provider Last Rate Last Dose  . acetaminophen (TYLENOL) tablet 650 mg  650 mg Oral Q6H PRN Shuvon Rankin, NP   650 mg at 04/18/13 1819  . alum & mag hydroxide-simeth (MAALOX/MYLANTA) 200-200-20 MG/5ML suspension 30 mL  30 mL Oral Q4H PRN Shuvon Rankin, NP      . divalproex (DEPAKOTE ER) 24 hr tablet 500 mg  500 mg Oral BID AC Fransisca Kaufmann, NP   500 mg at 04/19/13 0656  . LORazepam (ATIVAN) tablet 1 mg  1 mg Oral Q8H PRN Shuvon Rankin, NP   1 mg at 04/19/13 0949  . magnesium hydroxide (MILK OF MAGNESIA) suspension 30 mL  30 mL Oral Daily PRN Shuvon Rankin, NP      . nicotine (NICODERM CQ - dosed in mg/24 hours) patch 21 mg  21 mg Transdermal Daily Shuvon Rankin, NP      .  OLANZapine (ZYPREXA) tablet 15 mg  15 mg Oral QHS Mojeed Akintayo   15 mg at 04/18/13 2119  . risperiDONE (RISPERDAL) tablet 1 mg  1 mg Oral QHS Fransisca Kaufmann, NP        Lab Results: No results found for this or any previous visit (from the past 48 hour(s)).  Physical Findings: AIMS: Facial and Oral Movements Muscles of Facial Expression: None, normal Lips and Perioral Area: None, normal Jaw: None, normal Tongue: None, normal,Extremity Movements Upper (arms, wrists, hands, fingers): None, normal Lower (legs, knees, ankles, toes): None, normal, Trunk Movements Neck, shoulders, hips: None, normal, Overall  Severity Severity of abnormal movements (highest score from questions above): None, normal Incapacitation due to abnormal movements: None, normal Patient's awareness of abnormal movements (rate only patient's report): No Awareness, Dental Status Current problems with teeth and/or dentures?: No Does patient usually wear dentures?: No  CIWA:  CIWA-Ar Total: 5 COWS:  COWS Total Score: 0  Treatment Plan Summary: Daily contact with patient to assess and evaluate symptoms and progress in treatment Medication management  Plan: Continue crisis management and stabilization.  Medication management: Continue Zyprexa 15 mg at hs for psychosis. Continue Depakote 500 mg ER BID for mood stabilization. Patient to receive Risperdal 1 mg tonight and if tolerates well with start patient on Risperdal Consta Injection tomorrow morning.  Encouraged patient to attend groups and participate in group counseling sessions and activities.  Discharge plan in progress. Anticipate d/c tomorrow.  Address health issues: Vitals reviewed and stable.  Continue current treatment plan.   Medical Decision Making Problem Points:  Review of psycho-social stressors (1) Data Points:  Review of medication regiment & side effects (2)  I certify that inpatient services furnished can reasonably be expected to improve the patient's condition.   Fransisca Kaufmann, NP-C 04/19/2013, 1:04 PM

## 2013-04-19 NOTE — [facility] Suicide Risk Assessment (Signed)
[facility] INPATIENT:  Family/Significant Other Suicide Prevention Education  Suicide Prevention Education:  Patient Refusal for Family/Significant Other Suicide Prevention Education: The patient [name] has refused to provide written consent for family/significant other to be provided Family/Significant Other Suicide Prevention Education during admission and/or prior to discharge.  Physician notified.  [name] 04/19/2013, 2:57 PM

## 2013-04-19 NOTE — Progress Notes (Signed)
Patient ID: [name], male   DOB: [birth year], 37 y.o.   MRN: [id number] D:Patient states this morning that he is being discharged in the morning. Patient is to receive Risperdal Consta shot before discharge tomorrow.  His thought processes remain disorganized at times, however, he appears to be improving with his treatment here.  Patient is observed laughing inappropriately at times and having conversations with himself.  He denies any SI/HI/AVH.  A: continue to monitor medication management and MD orders.  Safety checks completed every 15 minutes per protocol.  R:Patient is redirectable and cooperative.

## 2013-04-20 MED ORDER — OLANZAPINE 15 MG PO TABS: 15.0000 mg | ORAL_TABLET | Freq: Every day | ORAL | Status: DC

## 2013-04-20 MED ORDER — DIVALPROEX SODIUM ER 500 MG PO TB24: 500.0000 mg | ORAL_TABLET | Freq: Two times a day (BID) | ORAL | Status: DC

## 2013-04-20 NOTE — Tx Team (Signed)
Interdisciplinary Treatment Plan Update   Date Reviewed:  04/20/2013  Time Reviewed:  10:36 AM  Progress in Treatment:   Attending groups: Yes Participating in groups: Yes Taking medication as prescribed: Yes  Tolerating medication: Yes Family/Significant other contact made: Yes  Patient understands diagnosis: Yes  Discussing patient identified problems/goals with staff: Yes Medical problems stabilized or resolved: Yes Denies suicidal/homicidal ideation: Yes Patient has not harmed self or others: Yes  For review of initial/current patient goals, please see plan of care.  Estimated Length of Stay:  Discharge today.  Reasons for Continued Hospitalization:   New Problems/Goals identified:    Discharge Plan or Barriers:   Homelessness  Additional Comments:  Outpatient CM working with patient on housing situation   Attendees:  Patient:  04/20/2013 10:36 AM   Signature: Dr.  Jannifer Franklin, MD 04/20/2013 10:36 AM  Signature:  Fransisca Kaufmann, NP 04/20/2013 10:36 AM  Signature: Nestor Ramp, RN 04/20/2013 10:36 AM  Signature:Erica Genevive Bi,, RN 04/20/2013 10:36 AM  Signature:   04/20/2013 10:36 AM  Signature:  Juline Patch, LCSW 04/20/2013 10:36 AM  Signature:   04/20/2013 10:36 AM  Signature:   04/20/2013 10:36 AM  Signature: 04/20/2013 10:36 AM  Signature:    Signature:    Signature:      Scribe for Treatment Team:   Juline Patch,  04/20/2013 10:36 AM

## 2013-04-20 NOTE — Progress Notes (Signed)
D   Pt can be intrusive  He was requesting that i contact his case manager outside the hospital but he did not know her name   He was very insistent that i do this as he is being discharged tomorrow Friday   He is otherwise appropriate for himself  He occasionally talks to himself and laughs inappropriately A   Verbal support given  Medications administered and effectiveness monitored   Q 15 min checks R   Pt safe at present

## 2013-04-20 NOTE — Progress Notes (Deleted)
Recreation Therapy Notes  Date: 07.25.2014 Time: 9:30am Location: [address] Dayroom  Group Topic: Memory/Cognition  Goal Area(s) Addresses:  Patient will verbalize activity of interest by end of group session. Patient will verbalize the ability to use positive leisure/recreation as a coping mechanism.  Behavioral Response: Disengaged  Intervention: Memory Recall  Activity: Color Block & Memory Sequence. Patients were shown pieces of paper with colors written on them, the colors were written in a different color than the word. For example: blue written in the color green. Patients were asked to both the color written and the color the word was written in. Patients were asked to complete a sequence of actions created by LRT.   Education: Cognitive health, Discharge planning, Personal Health   Education Outcome: Needs additional education  Clinical Observations/Feedback: Patient was observed to be anxious with pressured, hyper-verbal speech. Patient bounced knees up and down or moved legs left and right throughout entire session. Patient was successful at color block activity, often stating correct answer before peers were able to. Patient was unable to repeat sequence of actions created. Pateint body movements when attempting to repeat sequence were rigid and sporadic. Patient stated that he often has memory issues, specifically relating to taking his mediations. Patient indicated this was due to the fact that he does not think he truly needs medication. 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 Patient then without apparent or appropriate transition went on to speak about his lack of personal hygiene. Patient stated his family attempted to help him with his hygiene, but  were unable to. Patient stated that due to lack of hygiene he is missing teeth. Patient additionally blamed his dental health on "too much sugar and too much dope."  [employer], LRT/CTRS        Jearl Klinefelter 04/20/2013 12:55 PM

## 2013-04-20 NOTE — [facility] Suicide Risk Assessment (Signed)
Suicide Risk Assessment  Discharge Assessment     Demographic Factors:  Male, Low socioeconomic status and Unemployed  Mental Status Per Nursing Assessment::   On Admission:  NA  Current Mental Status by Physician: patient denies suicidal ideation, intent or plan  Loss Factors: Financial problems/change in socioeconomic status  Historical Factors: Impulsivity  Risk Reduction Factors:   Positive social support and Positive therapeutic relationship  Continued Clinical Symptoms:  Schizophrenia:   Paranoid or undifferentiated type  Cognitive Features That Contribute To Risk:  Closed-mindedness Polarized thinking    Suicide Risk:  Minimal: No identifiable suicidal ideation.  Patients presenting with no risk factors but with morbid ruminations; may be classified as minimal risk based on the severity of the depressive symptoms  Discharge Diagnoses:   AXIS I:  Schizoaffective disorder  AXIS II:  Deferred AXIS III:   Past Medical History  Diagnosis Date   AXIS IV:  other psychosocial or environmental problems and problems related to social environment AXIS V:  61-70 mild symptoms  Plan Of Care/Follow-up recommendations:  Activity:  as tolerated Diet:  healthy Tests:  Valproic acid level 41.1 Other:  patient to keep his after care appointment  Is patient on multiple antipsychotic therapies at discharge:  No   Has Patient had three or more failed trials of antipsychotic monotherapy by history:  No  Recommended Plan for Multiple Antipsychotic Therapies: N/A  Thedore Mins, MD 04/20/2013, 9:42 AM

## 2013-04-20 NOTE — Progress Notes (Signed)
Pt. Discharged per MD orders;  PT. Currently denies any HI/SI or auditory/visual hallucinations.  Pt. Was given education regarding follow up appointments and medications by this RN.  Pt. Denies any questions or concerns about the medications.  Pt. Was escorted to the search room to retrieve his belongings by RN before being discharged to the hospital lobby.

## 2013-04-20 NOTE — Progress Notes (Signed)
[facility] Adult Case Management Discharge Plan :  Will you be returning to the same living situation after discharge: No.  Patient and Outpatient CM to work on living arrangement At discharge, do you have transportation home?:Yes,  Outpatient CM transported patient at discahrge. Do you have the ability to pay for your medications:No.  Patient assisted with indigent medicaitons.  Release of information consent forms completed and in the chart;  Patient's signature needed at discharge.  Patient to Follow up at: Follow-up Information   Follow up with [facility]. (Ms Little will see you day of d/c)    Contact information:   [address]  [place]  [phone]      Patient denies SI/HI:   Patient no longer endorsing SI/HI or other thoughts of self harm. Safety Planning and Suicide Prevention discussed: .Reviewed with all patients during discharge planning groups.  Wynn Banker 04/20/2013, 10:49 AM

## 2013-04-20 NOTE — Discharge Summary (Signed)
Physician Discharge Summary Note  Patient:  [name] is an 37 y.o., male MRN:  [id number] DOB:  Jul 08, 1976 Patient phone:  [phone] (home)  Patient address:   [address] [place] [address]   Date of Admission:  04/15/2013 Date of Discharge: 04/20/13  Discharge Diagnoses: Active Problems:   * No active hospital problems. *  Axis Diagnosis:  AXIS I: Schizoaffective disorder  AXIS II: Deferred  AXIS III:  Past Medical History   Diagnosis  Date   AXIS IV: other psychosocial or environmental problems and problems related to social environment  AXIS V: 61-70 mild symptoms  Level of Care:  OP  Hospital Course:   [name] is an 37 y.o. male. Pt presents voluntarily to [facility]. Per ED notes, pt endorsed SI with plan to shoot himself in head. Pt is poor historian and does not answer all questions asked. Patient only wanting to know about "Can you call my case manager to get my money?" When told that the reasons for his admission were to be discussed at this time stated "I was suicidal but really I was just hungry because I had no money. Can I just go?" The patient then got up abruptly stating "I'm ending this meeting" and appeared mildly agitated. When writer attempted to speak with him again the patient answered several basic questions then again got up and left. The patient provided very limited information but does seem to be internally preoccupied. The patient has a low tolerance for engaging in conversation at this time due to psychosis. The patient needs to be stabilized on antipsychotic medications.   While a patient in this hospital, BEATRICE SEHGAL was enrolled in group counseling and activities as well as received the following medication Current facility-administered medications:acetaminophen (TYLENOL) tablet 650 mg, 650 mg, Oral, Q6H PRN, Shuvon Rankin, NP, 650 mg at 04/19/13 1702;  alum & mag hydroxide-simeth (MAALOX/MYLANTA) 200-200-20 MG/5ML suspension 30 mL, 30 mL,  Oral, Q4H PRN, Shuvon Rankin, NP;  divalproex (DEPAKOTE ER) 24 hr tablet 500 mg, 500 mg, Oral, BID AC, Fransisca Kaufmann, NP, 500 mg at 04/20/13 2130 LORazepam (ATIVAN) tablet 0.5 mg, 0.5 mg, Oral, Q12H PRN, Fransisca Kaufmann, NP, 0.5 mg at 04/19/13 2135;  magnesium hydroxide (MILK OF MAGNESIA) suspension 30 mL, 30 mL, Oral, Daily PRN, Shuvon Rankin, NP;  nicotine (NICODERM CQ - dosed in mg/24 hours) patch 21 mg, 21 mg, Transdermal, Daily, Shuvon Rankin, NP;  OLANZapine (ZYPREXA) tablet 15 mg, 15 mg, Oral, QHS, Mojeed Akintayo, 15 mg at 04/19/13 2135 Patient was noted to be psychotic on the unit and easily agitated during the first part of his admission. He could not engage in conversation and was only fixated on leaving the hospital. The patient was observed frequently laughing loudly to himself while pacing on the unit and walked in and out of groups being disruptive. The patient's prior to admission Zyprexa was continued which patient admitted "Maybe I take it three or four times a week." The MD decided to add Depakote ER 500 mg BID to help improve the stability of mood. Patient needed to be brought back to the unit several times when going down for recreation due to becoming agitated. The patient gradually improved with calmer behavior and the increased ability to express his thoughts. The patient received prescriptions and a sample supply of medications. Patient attended treatment team meeting this am and met with treatment team members. Pt symptoms, treatment plan and response to treatment discussed. [name] endorsed  that their symptoms have improved. Pt also stated that they are stable for discharge.  In other to control Active Problems:   * No active hospital problems. * , they will continue psychiatric care on outpatient basis. They will follow-up at  Follow-up Information   Follow up with [facility]. (Ms Little will see you day of d/c)    Contact information:   [address]  [place]   [phone]    .  In addition they were instructed to take all your medications as prescribed by your mental healthcare provider, to report any adverse effects and or reactions from your medicines to your outpatient provider promptly, patient is instructed and cautioned to not engage in alcohol and or illegal drug use while on prescription medicines, in the event of worsening symptoms, patient is instructed to call the crisis hotline, 911 and or go to the nearest ED for appropriate evaluation and treatment of symptoms.   Upon discharge, patient adamantly denies suicidal, homicidal ideations, auditory, visual hallucinations and or delusional thinking. They left [facility] with all personal belongings in no apparent distress.  Consults:  See electronic record for details  Significant Diagnostic Studies:  See electronic record for details  Discharge Vitals:   Blood pressure 113/81, pulse 109, temperature 97.7 F (36.5 C), temperature source Oral, resp. rate 20, height 5' 11.5" (1.816 m), weight 86.183 kg (190 lb), SpO2 99.00%..  Mental Status Exam: See Mental Status Examination and Suicide Risk Assessment completed by Attending Physician prior to discharge.  Discharge destination:  Home  Is patient on multiple antipsychotic therapies at discharge:  No  Has Patient had three or more failed trials of antipsychotic monotherapy by history: N/A Recommended Plan for Multiple Antipsychotic Therapies: N/A     Discharge Orders   Future Orders Complete By Expires     Activity as tolerated - No restrictions  As directed         Medication List       Indication   divalproex 500 MG 24 hr tablet  Commonly known as:  DEPAKOTE ER  Take 1 tablet (500 mg total) by mouth 2 (two) times daily before a meal. For mood stability.   Indication:  Mood lability     OLANZapine 15 MG tablet  Commonly known as:  ZYPREXA  Take 1 tablet (15 mg total) by mouth at bedtime.   Indication:  Schizophrenia        Follow-up Information   Follow up with [facility]. (Ms Little will see you day of d/c)    Contact information:   [address]  [place]  [phone]     Follow-up recommendations:   Activities: Resume typical activities Diet: Resume typical diet Tests: none Other: Follow up with outpatient provider and report any side effects to out patient prescriber.  Comments:  Take all your medications as prescribed by your mental healthcare provider. Report any adverse effects and or reactions from your medicines to your outpatient provider promptly. Patient is instructed and cautioned to not engage in alcohol and or illegal drug use while on prescription medicines. In the event of worsening symptoms, patient is instructed to call the crisis hotline, 911 and or go to the nearest ED for appropriate evaluation and treatment of symptoms. Follow-up with your primary care provider for your other medical issues, concerns and or health care needs.  SignedFransisca Kaufmann NP-C 04/20/2013 3:06 PM

## 2013-04-20 NOTE — Progress Notes (Signed)
Recreation Therapy Notes  Date: 07.25.2014 Time: 9:30am Location: 400 Hall Dayroom  Group Topic: Memory/Cognition  Goal Area(s) Addresses:  Patient will verbalize understanding of importance of working on memory/cognition. Patient will successfully repeat sequence performed by LRT.   Behavioral Response: Disengaged, Distracted  Intervention: Memory Recall  Activity: Color Block & Memory Sequence. Patients were shown pieces of paper with colors written on them, the colors were written in a different color than the word. For example: blue written in the color green. Patients were asked to identify both the color written and the color the word was written in. Patients were asked to complete a sequence of actions created by LRT.   Education: Cognitive health, Discharge planning, Personal Health   Education Outcome: Needs additional education  Clinical Observations/Feedback: Patient walked in and out of group session repeatedly during group session. Upon entering group session patient would sit down, but he would quickly arise and exit group session again. Patient spontaneously stated he was being discharged today. Patient spoke in an exited manner, with pressured speech and slightly raised voice.   Marykay Lex Yolando Gillum, LRT/CTRS  Dennie Vecchio L 04/20/2013 1:00 PM

## 2013-04-23 NOTE — Discharge Summary (Signed)
Seen and agreed. Laniesha Das, MD 

## 2013-04-25 NOTE — Progress Notes (Deleted)
Patient Discharge Instructions:  After Visit Summary (AVS):   Faxed to:  04/26/13 Discharge Summary Note:   Faxed to:  04/26/13 Psychiatric Admission Assessment Note:   Faxed to:  04/26/13 Suicide Risk Assessment - Discharge Assessment:   Faxed to:  04/26/13 Records sent via mail to:  [facility] [address] [place], [place] [address]  Jerelene Redden, 04/25/2013, 4:30 PM

## 2013-05-01 ENCOUNTER — Encounter (HOSPITAL_COMMUNITY): Payer: Self-pay | Admitting: Emergency Medicine

## 2013-05-01 ENCOUNTER — Emergency Department (HOSPITAL_COMMUNITY)
Admission: EM | Admit: 2013-05-01 | Discharge: 2013-05-01 | Disposition: A | Discharge status: Home / Self Care | Payer: Self-pay | Attending: Emergency Medicine | Admitting: Emergency Medicine

## 2013-05-01 ENCOUNTER — Emergency Department (EMERGENCY_DEPARTMENT_HOSPITAL)
Admission: EM | Admit: 2013-05-01 | Discharge: 2013-05-03 | Disposition: A | Discharge status: Other Acute Inpatient Hospital | Payer: Medicare Other | Source: Home / Self Care | Attending: Emergency Medicine | Admitting: Emergency Medicine

## 2013-05-01 DIAGNOSIS — F209 Schizophrenia, unspecified: Secondary | ICD-10-CM | POA: Diagnosis not present

## 2013-05-01 DIAGNOSIS — F17200 Nicotine dependence, unspecified, uncomplicated: Secondary | ICD-10-CM | POA: Insufficient documentation

## 2013-05-01 DIAGNOSIS — F259 Schizoaffective disorder, unspecified: Secondary | ICD-10-CM | POA: Diagnosis not present

## 2013-05-01 DIAGNOSIS — Z5900 Homelessness unspecified: Secondary | ICD-10-CM | POA: Insufficient documentation

## 2013-05-01 DIAGNOSIS — F203 Undifferentiated schizophrenia: Secondary | ICD-10-CM

## 2013-05-01 DIAGNOSIS — F1410 Cocaine abuse, uncomplicated: Secondary | ICD-10-CM

## 2013-05-01 DIAGNOSIS — R443 Hallucinations, unspecified: Secondary | ICD-10-CM | POA: Insufficient documentation

## 2013-05-01 NOTE — ED Notes (Signed)
Per EMS, pt at [facility], called EMS, for SI- Plan to shoot himself in the head, no access to firearms.  Pt laughing inappropriately at this time.

## 2013-05-01 NOTE — ED Notes (Signed)
Pt has white tennis shoes, blue jeans, blue shorts, gray shirt in a locker room in [place]

## 2013-05-01 NOTE — ED Notes (Signed)
ONG:[id number]<WU> Expected date:<BR> Expected time:<BR> Means of arrival:<BR> Comments:<BR> EMS/36 yo male with SI

## 2013-05-01 NOTE — ED Notes (Signed)
Pt arrives by EMS-picked up at [facility] on [address]-[facility] called and then [employer] called EMS because pt stated he was suicidal-plan to shoot himself-but no access to guns

## 2013-05-01 NOTE — ED Notes (Signed)
Pt has blue jeans, white tennis shoes, blue boxers, and a gray shirt in locker # 28.

## 2013-05-01 NOTE — ED Provider Notes (Signed)
  CSN: [id number]     Arrival date & time 05/01/13  2251 History     First MD Initiated Contact with Patient 05/01/13 2255     No chief complaint on file.  (Consider location/radiation/quality/duration/timing/severity/associated sxs/prior Treatment) HPI Comments: Patient with a history of Paranoid Schizophrenia presents today with suicidal thoughts.  He reports that he has a plan to shoot himself.  He states that he does have a gun, however, he told the triage nurse that he does not have an access to a gun.  He has numerous ED visits for the same.  He denies HI.  He admits to auditory hallucinations, but will not elaborate on this further.  He is currently homeless.  He admits that he has not been taking any psychiatric medications.   He denies any recent alcohol or drug use.  He denies any physical complaints at this time.  The history is provided by the patient.    Past Medical History  Diagnosis Date  . Schizophrenic disorder    No past surgical history on file. No family history on file. History  Substance Use Topics  . Smoking status: Current Every Day Smoker -- 1.00 packs/day for 15 years    Types: Cigarettes  . Smokeless tobacco: Not on file  . Alcohol Use: No    Review of Systems  Unable to perform ROS: Psychiatric disorder    Allergies  Haldol  Home Medications   Current Outpatient Rx  Name  Route  Sig  Dispense  Refill  . divalproex (DEPAKOTE ER) 500 MG 24 hr tablet   Oral   Take 1 tablet (500 mg total) by mouth 2 (two) times daily before a meal. For mood stability.   60 tablet   0   . OLANZapine (ZYPREXA) 15 MG tablet   Oral   Take 1 tablet (15 mg total) by mouth at bedtime.   30 tablet   0    BP 141/88  Pulse 70  Temp(Src) 98.3 F (36.8 C) (Oral)  Resp 20  SpO2 100% Physical Exam  Nursing note and vitals reviewed. Constitutional: He appears well-developed and well-nourished.  Patient disheveled   HENT:  Head: Normocephalic and atraumatic.   Cardiovascular: Normal rate, regular rhythm and normal heart sounds.   Pulmonary/Chest: Effort normal and breath sounds normal.  Neurological: He is alert.  Skin: Skin is warm and dry.  Psychiatric: His affect is inappropriate.  Patient laughing inappropriately     ED Course   Procedures (including critical care time)  Labs Reviewed - No data to display No results found. No diagnosis found.  MDM  Patient with a history of Paranoid Schizophrenia presents today with suicidal thought and plans to shoot himself with a gun. He reports that he currently is not taking any psychiatric medications.  He is homeless.  Denies HI. Labs unremarkable.  Psych holding orders have been placed.  ACT team consulted.  Pascal Lux [place], PA-C 05/02/13 [phone]

## 2013-05-02 ENCOUNTER — Encounter (HOSPITAL_COMMUNITY): Payer: Self-pay | Admitting: Registered Nurse

## 2013-05-02 DIAGNOSIS — F259 Schizoaffective disorder, unspecified: Secondary | ICD-10-CM

## 2013-05-02 LAB — CBC
MCH: 31.5 pg (ref 26.0–34.0)
MCHC: 34.9 g/dL (ref 30.0–36.0)
Platelets: 220 10*3/uL (ref 150–400)
RBC: 4.35 MIL/uL (ref 4.22–5.81)

## 2013-05-02 LAB — COMPREHENSIVE METABOLIC PANEL
ALT: 13 U/L (ref 0–53)
AST: 12 U/L (ref 0–37)
CO2: 26 mEq/L (ref 19–32)
Calcium: 8.8 mg/dL (ref 8.4–10.5)
Sodium: 136 mEq/L (ref 135–145)
Total Protein: 6.5 g/dL (ref 6.0–8.3)

## 2013-05-02 LAB — SALICYLATE LEVEL: Salicylate Lvl: <2 mg/dL — ABNORMAL LOW (ref 2.8–20.0)

## 2013-05-02 LAB — RAPID URINE DRUG SCREEN, HOSP PERFORMED
Amphetamines: NOT DETECTED
Benzodiazepines: NOT DETECTED
Opiates: NOT DETECTED

## 2013-05-02 MED ORDER — DIPHENHYDRAMINE HCL 25 MG PO CAPS: 50.0000 mg | ORAL_CAPSULE | Freq: Once | ORAL | Status: DC

## 2013-05-02 MED ORDER — ACETAMINOPHEN 325 MG PO TABS: 650.0000 mg | ORAL_TABLET | ORAL | Status: DC | PRN

## 2013-05-02 MED ORDER — IBUPROFEN 200 MG PO TABS
600.0000 mg | ORAL_TABLET | Freq: Three times a day (TID) | ORAL | Status: DC | PRN
  Administered 2013-05-02: 600 mg via ORAL
  Filled 2013-05-02: qty 3

## 2013-05-02 MED ORDER — ONDANSETRON HCL 4 MG PO TABS: 4.0000 mg | ORAL_TABLET | Freq: Three times a day (TID) | ORAL | Status: DC | PRN

## 2013-05-02 MED ORDER — LORAZEPAM 1 MG PO TABS
1.0000 mg | ORAL_TABLET | Freq: Three times a day (TID) | ORAL | Status: DC | PRN
  Administered 2013-05-02 – 2013-05-03 (×4): 1 mg via ORAL
  Filled 2013-05-02 (×4): qty 1

## 2013-05-02 MED ORDER — ZIPRASIDONE MESYLATE 20 MG IM SOLR: 20.0000 mg | Freq: Two times a day (BID) | INTRAMUSCULAR | Status: DC | PRN

## 2013-05-02 NOTE — ED Notes (Signed)
Pt transferred from main ed, presents for medical clearance,  Pt SI with no plan, denies HI.  Internal stimuli noted.  Pt laughing inappropriately.  Pt calm & cooperative at present.

## 2013-05-02 NOTE — Progress Notes (Signed)
[facility] ED CM noted  Medicare pt without a pcp CM spoke with pt who confirms he does not have a pcp and has not seen one in a long time

## 2013-05-02 NOTE — ED Notes (Signed)
i gave patient a urine cup and explained to him that we needed a urine he went in the bathroom used the bathroom but didn't give any urine,chan nt said when he was in room 16 he was also told he needed a urine and didn't provide one then

## 2013-05-02 NOTE — ED Notes (Signed)
Pt having increased anxiety and agitation. Pt was given ativan.

## 2013-05-02 NOTE — ED Notes (Signed)
Pt is more psychotic throughout the day laughing inappropriately and taking his clothes off. Redirected pt as needed. Encouraged pt to give a urine sample, take shower and put scrubs on. Safety maintained.

## 2013-05-02 NOTE — ED Notes (Signed)
Pt has been to desk requesting something to eat and drink every hour.  Pt rambling, cursing using obscenities.  Pt redirected back to room.

## 2013-05-02 NOTE — Progress Notes (Signed)
Per discussion with NP, patient interested in shelter. CSW confirmed with [facility] that patient is elligible to be at shelter, pending bed availiability. Per discussion with NP, patient pending inpatient hospitaliziation.    Catha Gosselin, LCSWA  [phone] .[id number] pm

## 2013-05-02 NOTE — BH Assessment (Addendum)
Assessment Note   Patient is a 37 year old white male.  Patient reports that his evil twin is trying to kill him and take his wife.  Patient was laughing inappropriately throughout the entire assessment.  Patient then reported that he is going to kill his evil twin before he kills him. Patient reports that he has been diagnosed with Schizophrenia.  Documentation in the epic chart reports that the patient has a history of psychiatric hospitalizations as well as for substance abuse.    During the assessment the patient was not able to remain focused and continued to have flight of ideas.  Patient rambled during the assessment and he was not able to answer many of the questions during the assessment.  Patient stated that he did have SI with a plan to shot himself.  He then reported that he does not have access to a gun.  Patient then laughed.  Patient denied psychosis but he constantly looked all over the room and over my shoulder as if he could see someone else in the room.    Patient reports that he does not know when he took his medication last.  Patient reports that he wants to remain homeless.  He admits that he has not been taking any psychiatric medications. He denies any recent alcohol or drug use.   Patient UDS was positive for cocaine.  His BAL is <11.  Patient became loud and agitated when he was informed that he was positive for cocaine. Patient refused to answer any questions about past substance abuse.  Patient repeated that he had a problem in the past with drugs but he cannot remember the last time that he used any drugs or alcohol.    Axis I: Paranoid Schizophrenia and Cocaine Dependence  Axis II: Deferred Axis III:  Past Medical History  Diagnosis Date  . Schizophrenic disorder    Axis IV: economic problems, educational problems, housing problems, occupational problems, other psychosocial or environmental problems, problems related to social environment, problems with access to health  care services and problems with primary support group Axis V: 21-30 behavior considerably influenced by delusions or hallucinations OR serious impairment in judgment, communication OR inability to function in almost all areas  Past Medical History:  Past Medical History  Diagnosis Date  . Schizophrenic disorder     History reviewed. No pertinent past surgical history.  Family History: History reviewed. No pertinent family history.  Social History:  reports that he has been smoking Cigarettes.  He has a 15 pack-year smoking history. He does not have any smokeless tobacco history on file. He reports that he uses illicit drugs (Cocaine). He reports that he does not drink alcohol.  Additional Social History:     CIWA: CIWA-Ar BP: 125/75 mmHg Pulse Rate: 84 COWS:    Allergies:  Allergies  Allergen Reactions  . Haldol (Haloperidol Decanoate) Other (See Comments)    'I curl up"    Home Medications:  (Not in a hospital admission)  OB/GYN Status:  No LMP for male patient.  General Assessment Data Location of Assessment: [facility] Is this a Tele or Face-to-Face Assessment?: Face-to-Face Is this an Initial Assessment or a Re-assessment for this encounter?: Initial Assessment Living Arrangements: Alone Can pt return to current living arrangement?: Yes Admission Status: Voluntary Is patient capable of signing voluntary admission?: Yes Transfer from: Acute Hospital Referral Source: Self/Family/Friend  Education Status Is patient currently in school?: No  Risk to self Suicidal Ideation: No Suicidal Intent: No  Is patient at risk for suicide?: No Suicidal Plan?: No Specify Current Suicidal Plan: None reported Access to Means: No Specify Access to Suicidal Means: No What has been your use of drugs/alcohol within the last 12 months?: None Previous Attempts/Gestures: No How many times?:  (unknown) Other Self Harm Risks:  (unknown - Patient would not answer the question ) Triggers  for Past Attempts: Unpredictable Intentional Self Injurious Behavior: None Family Suicide History: Unable to assess Recent stressful life event(s): Other (Comment) Persecutory voices/beliefs?: No Depression: Yes Depression Symptoms: Feeling angry/irritable;Feeling worthless/self pity Substance abuse history and/or treatment for substance abuse?: Yes Suicide prevention information given to non-admitted patients: Yes  Risk to Others Homicidal Ideation: No Thoughts of Harm to Others: No Current Homicidal Intent: No Current Homicidal Plan: No Access to Homicidal Means: No Identified Victim: None Reported History of harm to others?: No Assessment of Violence: None Noted Violent Behavior Description: yelling  Does patient have access to weapons?: No Criminal Charges Pending?: No Does patient have a court date: No  Psychosis Hallucinations: None noted Delusions: None noted  Mental Status Report Appear/Hygiene: Body odor;Poor hygiene;Bizarre Eye Contact: Poor Motor Activity: Agitation;Freedom of movement;Restlessness Speech: Word salad Level of Consciousness: Alert Mood: Anxious Affect: Preoccupied;Inconsistent with thought content Anxiety Level: Minimal Thought Processes: Coherent;Relevant Judgement: Unimpaired Orientation: Person;Place;Situation Obsessive Compulsive Thoughts/Behaviors: None  Cognitive Functioning Concentration: Decreased Memory: Recent Intact;Remote Intact IQ: Average Insight: Poor Impulse Control: Poor Appetite: Poor Weight Loss: 0 Weight Gain: 0 Sleep: No Change Total Hours of Sleep: 5 Vegetative Symptoms: Decreased grooming;Not bathing  ADLScreening [facility] Assessment Services) Patient's cognitive ability adequate to safely complete daily activities?: Yes Patient able to express need for assistance with ADLs?: Yes Independently performs ADLs?: Yes (appropriate for developmental age)  Prior Inpatient Therapy Prior Inpatient Therapy: No Prior  Therapy Dates: 2009-2013 Prior Therapy Facilty/Provider(s): [facility] Reason for Treatment: schizophrenia, SI  Prior Outpatient Therapy Prior Outpatient Therapy: Yes Prior Therapy Dates:  (unable to remember ) Prior Therapy Facilty/Provider(s):  (unable to remember ) Reason for Treatment:  (None reported)  ADL Screening (condition at time of admission) Patient's cognitive ability adequate to safely complete daily activities?: Yes Patient able to express need for assistance with ADLs?: Yes Independently performs ADLs?: Yes (appropriate for developmental age)       Abuse/Neglect Assessment (Assessment to be complete while patient is alone) Physical Abuse: Denies Verbal Abuse: Denies Sexual Abuse: Denies Values / Beliefs Cultural Requests During Hospitalization: None Spiritual Requests During Hospitalization: None        Additional Information 1:1 In Past 12 Months?: No CIRT Risk: No Elopement Risk: No Does patient have medical clearance?: Yes     Disposition:  Disposition Initial Assessment Completed for this Encounter: Yes Disposition of Patient: Inpatient treatment program;Other dispositions Type of inpatient treatment program: Adult Other disposition(s): To current provider Patient referred to: Other (Comment)  On Site Evaluation by:   Reviewed with Physician:    [name] Heal LaVerne 05/02/2013 3:41 PM

## 2013-05-02 NOTE — Consult Note (Signed)
[facility] Psychiatry Consult   Reason for Consult:  Evaluation for inpatient treatment Referring Physician:  EDP  [name] is an 37 y.o. male.  Assessment: AXIS I:  Schizoaffective Disorder AXIS II:  Deferred AXIS III:   Past Medical History  Diagnosis Date  . Schizophrenic disorder    AXIS IV:  economic problems, housing problems, occupational problems, other psychosocial or environmental problems, problems related to social environment and problems with primary support group AXIS V:  21-30 behavior considerably influenced by delusions or hallucinations OR serious impairment in judgment, communication OR inability to function in almost all areas  Plan:  Recommend psychiatric Inpatient admission when medically cleared.  Subjective:   [name] is a 37 y.o. male.  HPI:  Patient presents to [facility] with complaints of suicidal ideations.  During this interview patient would answer "I don't know" to all questions asked.  Patient states that he is suicidal and he is hearing voices.  Patient states that he is homeless and has not been taking his medication.  When asked about his stressors; what happen for him to come to hospital; how did he get to hospital; if he has depression.  Patient answered all questions "I don't know then laughed;stating "It is not funny" (while smiling) then stated "I don't care if guy is gay or not but if you stop me; I like pussy."    Later when presented patient with possible place to stay.  Patient stated that his evil twin would be there and try to kill him.  Past Psychiatric History: Past Medical History  Diagnosis Date  . Schizophrenic disorder     reports that he has been smoking Cigarettes.  He has a 15 pack-year smoking history. He does not have any smokeless tobacco history on file. He reports that he uses illicit drugs (Cocaine). He reports that he does not drink alcohol. History reviewed. No pertinent family history.         Allergies:   Allergies   Allergen Reactions  . Haldol (Haloperidol Decanoate) Other (See Comments)    'I curl up"    Past Psychiatric History: Diagnosis:  Schizoaffective disorder  Hospitalizations:  [facility] last d/c 04/16/13  Outpatient Care:  No follow up  Substance Abuse Care:  Has history  Self-Mutilation:  None noted at this time  Suicidal Attempts:    Violent Behaviors:     Objective: Blood pressure 125/75, pulse 84, temperature 97.7 F (36.5 C), temperature source Oral, resp. rate 12, SpO2 99.00%.There is no weight on file to calculate BMI. Results for orders placed during the hospital encounter of 05/01/13 (from the past 72 hour(s))  CBC     Status: None   Collection Time    05/01/13 11:01 PM      Result Value Range   WBC 8.9  4.0 - 10.5 K/uL   RBC 4.35  4.22 - 5.81 MIL/uL   Hemoglobin 13.7  13.0 - 17.0 g/dL   HCT 78.2  95.6 - 21.3 %   MCV 90.1  78.0 - 100.0 fL   MCH 31.5  26.0 - 34.0 pg   MCHC 34.9  30.0 - 36.0 g/dL   RDW 08.6  57.8 - 46.9 %   Platelets 220  150 - 400 K/uL  COMPREHENSIVE METABOLIC PANEL     Status: Abnormal   Collection Time    05/01/13 11:01 PM      Result Value Range   Sodium 136  135 - 145 mEq/L   Potassium 3.2 (*)  3.5 - 5.1 mEq/L   Chloride 100  96 - 112 mEq/L   CO2 26  19 - 32 mEq/L   Glucose, Bld 90  70 - 99 mg/dL   BUN 9  6 - 23 mg/dL   Creatinine, Ser 4.09  0.50 - 1.35 mg/dL   Calcium 8.8  8.4 - 81.1 mg/dL   Total Protein 6.5  6.0 - 8.3 g/dL   Albumin 3.6  3.5 - 5.2 g/dL   AST 12  0 - 37 U/L   ALT 13  0 - 53 U/L   Alkaline Phosphatase 97  39 - 117 U/L   Total Bilirubin 0.4  0.3 - 1.2 mg/dL   GFR calc non Af Amer >90  >90 mL/min   GFR calc Af Amer >90  >90 mL/min   Comment:            The eGFR has been calculated     using the CKD EPI equation.     This calculation has not been     validated in all clinical     situations.     eGFR's persistently     <90 mL/min signify     possible Chronic Kidney Disease.  ETHANOL     Status: None   Collection Time     05/01/13 11:01 PM      Result Value Range   Alcohol, Ethyl (B) <11  0 - 11 mg/dL   Comment:            LOWEST DETECTABLE LIMIT FOR     SERUM ALCOHOL IS 11 mg/dL     FOR MEDICAL PURPOSES ONLY  ACETAMINOPHEN LEVEL     Status: None   Collection Time    05/01/13 11:01 PM      Result Value Range   Acetaminophen (Tylenol), Serum <15.0  10 - 30 ug/mL   Comment:            THERAPEUTIC CONCENTRATIONS VARY     SIGNIFICANTLY. A RANGE OF 10-30     ug/mL MAY BE AN EFFECTIVE     CONCENTRATION FOR MANY PATIENTS.     HOWEVER, SOME ARE BEST TREATED     AT CONCENTRATIONS OUTSIDE THIS     RANGE.     ACETAMINOPHEN CONCENTRATIONS     >150 ug/mL AT 4 HOURS AFTER     INGESTION AND >50 ug/mL AT 12     HOURS AFTER INGESTION ARE     OFTEN ASSOCIATED WITH TOXIC     REACTIONS.  SALICYLATE LEVEL     Status: Abnormal   Collection Time    05/01/13 11:01 PM      Result Value Range   Salicylate Lvl <2.0 (*) 2.8 - 20.0 mg/dL   Current Facility-Administered Medications  Medication Dose Route Frequency Provider Last Rate Last Dose  . acetaminophen (TYLENOL) tablet 650 mg  650 mg Oral Q4H PRN Pascal Lux Wingen, PA-C      . ibuprofen (ADVIL,MOTRIN) tablet 600 mg  600 mg Oral Q8H PRN Pascal Lux Wingen, PA-C      . LORazepam (ATIVAN) tablet 1 mg  1 mg Oral Q8H PRN Pascal Lux Wingen, PA-C   1 mg at 05/02/13 9147  . ondansetron (ZOFRAN) tablet 4 mg  4 mg Oral Q8H PRN Pascal Lux Wingen, PA-C       No current outpatient prescriptions on file.    Psychiatric Specialty Exam:     Blood pressure 125/75, pulse 84, temperature 97.7 F (36.5 C), temperature  source Oral, resp. rate 12, SpO2 99.00%.There is no weight on file to calculate BMI.  General Appearance: Bizarre, Disheveled and strong odor  Eye Contact::  Good  Speech:  Clear and Coherent and Normal Rate  Volume:  Normal  Mood:  Anxious  Affect:  Non-Congruent  Thought Process:  Linear  Orientation:  Other:  Person, Place  Thought Content:   Hallucinations: Auditory  Suicidal Thoughts:  Yes.  with intent/plan  Homicidal Thoughts:  No  Memory:  Immediate;   Poor Recent;   Poor Remote;   Poor  Judgement:  Poor  Insight:  Lacking  Psychomotor Activity:  Normal  Concentration:  Poor  Recall:  Poor  Akathisia:  No  Handed:  Right  AIMS (if indicated):     Assets:    Sleep:      Treatment Plan Summary: Daily contact with patient to assess and evaluate symptoms and progress in treatment Medication management Find inpatient placement.  Patient declined [facility] meeting theraputic level  Rankin, Shuvon, FNP-BC 05/02/2013 11:59 AM  I have personally seen the patient and agreed with the findings and involved in the treatment plan. Kathryne Sharper, MD

## 2013-05-03 ENCOUNTER — Inpatient Hospital Stay (HOSPITAL_COMMUNITY)
Admission: AD | Admit: 2013-05-03 | Discharge: 2013-05-11 | DRG: 885 | Disposition: A | Discharge status: Home / Self Care | Payer: Medicare Other | Source: Intra-hospital | Attending: Psychiatry | Admitting: Psychiatry

## 2013-05-03 ENCOUNTER — Encounter (HOSPITAL_COMMUNITY): Payer: Self-pay | Admitting: Rehabilitation

## 2013-05-03 DIAGNOSIS — Z79899 Other long term (current) drug therapy: Secondary | ICD-10-CM

## 2013-05-03 DIAGNOSIS — F203 Undifferentiated schizophrenia: Secondary | ICD-10-CM

## 2013-05-03 DIAGNOSIS — F1420 Cocaine dependence, uncomplicated: Secondary | ICD-10-CM | POA: Diagnosis present

## 2013-05-03 DIAGNOSIS — F259 Schizoaffective disorder, unspecified: Secondary | ICD-10-CM | POA: Diagnosis present

## 2013-05-03 DIAGNOSIS — F200 Paranoid schizophrenia: Secondary | ICD-10-CM

## 2013-05-03 DIAGNOSIS — R45851 Suicidal ideations: Secondary | ICD-10-CM

## 2013-05-03 DIAGNOSIS — F1410 Cocaine abuse, uncomplicated: Secondary | ICD-10-CM

## 2013-05-03 MED ORDER — HALOPERIDOL LACTATE 5 MG/ML IJ SOLN
INTRAMUSCULAR | Status: AC
  Administered 2013-05-03: 13:00:00
  Filled 2013-05-03: qty 1

## 2013-05-03 MED ORDER — DIVALPROEX SODIUM ER 500 MG PO TB24
500.0000 mg | ORAL_TABLET | Freq: Two times a day (BID) | ORAL | Status: DC
  Administered 2013-05-04 – 2013-05-06 (×5): 500 mg via ORAL
  Filled 2013-05-03 (×11): qty 1

## 2013-05-03 MED ORDER — DIVALPROEX SODIUM ER 500 MG PO TB24
500.0000 mg | ORAL_TABLET | Freq: Two times a day (BID) | ORAL | Status: DC
  Filled 2013-05-03 (×2): qty 1

## 2013-05-03 MED ORDER — ALUM & MAG HYDROXIDE-SIMETH 200-200-20 MG/5ML PO SUSP: 30.0000 mL | ORAL | Status: DC | PRN

## 2013-05-03 MED ORDER — OLANZAPINE 5 MG PO TABS
15.0000 mg | ORAL_TABLET | Freq: Every day | ORAL | Status: DC
  Administered 2013-05-03: 15 mg via ORAL
  Filled 2013-05-03: qty 3

## 2013-05-03 MED ORDER — LORAZEPAM 2 MG/ML IJ SOLN
INTRAMUSCULAR | Status: AC
  Administered 2013-05-03: 13:00:00
  Filled 2013-05-03: qty 1

## 2013-05-03 MED ORDER — OLANZAPINE 7.5 MG PO TABS
15.0000 mg | ORAL_TABLET | Freq: Every day | ORAL | Status: DC
  Administered 2013-05-04 – 2013-05-05 (×2): 15 mg via ORAL
  Filled 2013-05-03 (×4): qty 2

## 2013-05-03 MED ORDER — ZIPRASIDONE MESYLATE 20 MG IM SOLR
20.0000 mg | Freq: Two times a day (BID) | INTRAMUSCULAR | Status: DC | PRN
  Administered 2013-05-04 – 2013-05-06 (×3): 20 mg via INTRAMUSCULAR
  Filled 2013-05-03 (×4): qty 20

## 2013-05-03 MED ORDER — MAGNESIUM HYDROXIDE 400 MG/5ML PO SUSP: 30.0000 mL | Freq: Every day | ORAL | Status: DC | PRN

## 2013-05-03 MED ORDER — ACETAMINOPHEN 325 MG PO TABS
650.0000 mg | ORAL_TABLET | Freq: Four times a day (QID) | ORAL | Status: DC | PRN
  Administered 2013-05-04 – 2013-05-08 (×2): 650 mg via ORAL

## 2013-05-03 NOTE — Progress Notes (Signed)
Follow up Progress Note: Face to face interview and consult with Dr. Katherine Mantle T Staley07/26/[id number]  Subjective:  Patient states that he is feeling better and is ready to go.  Patient states that we need to call his case worker and let her know that he is here and the name and numbers given were [phone] and [phone].  Patient denies suicidal ideations, homicidal ideations, psychosis, and paranoia.  Patient states that he was acting because need bath and food.    Patient appears to be psychotic at this time he is switching form one subject to the next, patient also stating that he was shot in the head, "They make me suck dick and I aint gay, I like pussy."    Current Medication Current facility-administered medications:acetaminophen (TYLENOL) tablet 650 mg, 650 mg, Oral, Q4H PRN, Pascal Lux Wingen, PA-C;  diphenhydrAMINE (BENADRYL) capsule 50 mg, 50 mg, Oral, Once, Spencer E Simon, PA-C;  ibuprofen (ADVIL,MOTRIN) tablet 600 mg, 600 mg, Oral, Q8H PRN, Pascal Lux Wingen, PA-C, 600 mg at 05/02/13 2336;  LORazepam (ATIVAN) tablet 1 mg, 1 mg, Oral, Q8H PRN, Pascal Lux Wingen, PA-C, 1 mg at 05/03/13 1003 ondansetron (ZOFRAN) tablet 4 mg, 4 mg, Oral, Q8H PRN, Pascal Lux Wingen, PA-C;  ziprasidone (GEODON) injection 20 mg, 20 mg, Intramuscular, Q12H PRN, Kerry Hough, PA-C No current outpatient prescriptions on file.  Assessment Axis I: Schizoaffective Disorder Axis II: Deferred Axis III:  Past Medical History  Diagnosis Date  . Schizophrenic disorder    Axis IV: housing problems, other psychosocial or environmental problems, problems related to social environment, problems with access to health care services and problems with primary support group Axis V: 51-60 moderate symptoms  Plan Recommendation:  Agree with previous assessment for inpatient treatment   Paulmichael Schreck, FNP-BC

## 2013-05-03 NOTE — Progress Notes (Signed)
[name] is a 37 year old male admitted from [facility] due to psychosis.  He was very agitated upon admission and refused to sign paperwork.  He was searched, brought back to his room, and given a lunch tray.  He has a history of schizophrenia and appears to be responding to internal stimuli.  He laughs inappropriately at times.

## 2013-05-03 NOTE — Progress Notes (Signed)
Patient ID: [name], male   DOB: [birth year], 37 y.o.   MRN: [id number] Patient became agitated and aggressive upon admission.  Patient was almost a CIRT upon arrival in [facility] building.  He was searched and brought immediately on the unit.  He continued to exhibit aggressive behavior.  He was given 2 mg ativan / 5 mg haldol injection.  Patient is now resting quietly.

## 2013-05-03 NOTE — Progress Notes (Addendum)
Initial Interdisciplinary Treatment Plan  PATIENT STRENGTHS: (choose at least two) Average or above average intelligence  PATIENT STRESSORS: Financial difficulties Substance abuse   PROBLEM LIST: Problem List/Patient Goals Date to be addressed Date deferred Reason deferred Estimated date of resolution  Psychosis 05/03/13                                                      DISCHARGE CRITERIA:  Ability to meet basic life and health needs Reduction of life-threatening or endangering symptoms to within safe limits  PRELIMINARY DISCHARGE PLAN: Attend aftercare/continuing care group  PATIENT/FAMIILY INVOLVEMENT: This treatment plan has been presented to and reviewed with the patient, [name].  The patient and family have been given the opportunity to ask questions and make suggestions.  Angela Adam 05/03/2013, 2:06 PM

## 2013-05-03 NOTE — ED Notes (Addendum)
Pt is awake and alert, pt is agitated and aggressive.  Pt was given zyprex15mg . Patient denies HI, SI AH or VH. Discharge vitals 103/66 O2 sat 98 room air HR 85 RR 16 and unlabored. Pt is transfer to intpatient treatment [facility].  Called report to RN Christina-[facility] ([id number])Will continue to monitor for safety. Patient escorted off unit via [employer] without incident. T.Melvyn Neth RN

## 2013-05-03 NOTE — BH Assessment (Signed)
[facility] Assessment Progress Note      Called [facility] to follow up on referral.  Per Christiane Ha no referral received for [employer].  Refaxed at [phone].

## 2013-05-03 NOTE — ED Provider Notes (Signed)
Pt will be transferred to [facility] for inpatient treatment.  Celene Kras, MD 05/03/13 [phone]

## 2013-05-03 NOTE — Tx Team (Signed)
  Interdisciplinary Treatment Plan Update   Date Reviewed:  05/03/2013  Time Reviewed:  4:06 PM  Progress in Treatment:   Attending groups: No Participating in groups: No Taking medication as prescribed: Yes  Tolerating medication: Yes Family/Significant other contact made: Yes,  Left msg for case mgr with [facility] Patient understands diagnosis: YNo  Limited insight Discussing patient identified problems/goals with staff: Yes  See initial plan Medical problems stabilized or resolved: Yes Denies suicidal/homicidal ideation: Yes  In tx team Patient has not harmed self or others: Yes  For review of initial/current patient goals, please see plan of care.  Estimated Length of Stay:  4-5 days  Reason for Continuation of Hospitalization: Delusions  Hallucinations Medication stabilization  New Problems/Goals identified:  N/A  Discharge Plan or Barriers:   unknown  Additional Comments:  Patient is a 37 year old white male. Patient reports that his evil twin is trying to kill him and take his wife. Patient was laughing inappropriately throughout the entire assessment. Patient then reported that he is going to kill his evil twin before he kills him. Patient reports that he has been diagnosed with Schizophrenia.  His UDS was positive for cocaine.   Attendees:  Signature: Thedore Mins, MD 05/03/2013 4:06 PM   Signature: Richelle Ito, LCSW 05/03/2013 4:06 PM  Signature: Fransisca Kaufmann, NP 05/03/2013 4:06 PM  Signature: Joslyn Devon, RN 05/03/2013 4:06 PM  Signature:  05/03/2013 4:06 PM  Signature:  05/03/2013 4:06 PM  Signature:   05/03/2013 4:06 PM  Signature:    Signature:    Signature:    Signature:    Signature:    Signature:      Scribe for Treatment Team:   Richelle Ito, LCSW  05/03/2013 4:06 PM

## 2013-05-03 NOTE — [facility] Counselor (Signed)
Adult Psychosocial Assessment Update Interdisciplinary Team  [facility] admissions[facility]discharges:  Admissions Discharges  Date: 7/14 Date:  Date: 3/13 Date:  Date: 8/12 Date:  Date: Date:  Date: Date:   Changes since the last Psychosocial Assessment (including adherence to outpatient mental health and/or substance abuse treatment, situational issues contributing to decompensation and/or relapse). Fredy states that he is homeless and everyone is against him.  Also states "I have money in the bank" and cannot understand why he finds himself in this situation.  "They kicked me out of the [facility]."             Discharge Plan 1. Will you be returning to the same living situation after discharge?   Yes: No: X     If no, what is your plan?    Unknown       2. Would you like a referral for services when you are discharged? Yes:     If yes, for what services?  No:   X      Already has a provider-[facility]       Summary and Recommendations (to be completed by the evaluator) Quamaine is a 37 YO Caucasian male who has co-occuring illnesses of substance abuse and mental illness.  He is non-compliant with medications and has been using cocaine since his last admission.  He has poor impulse control and limited insight.  He can benefit from crises stabilization, medication management, therapeutic milieu and referral for services.                       Signature:  Ida Rogue, 05/03/2013 4:30 PM

## 2013-05-04 DIAGNOSIS — F209 Schizophrenia, unspecified: Secondary | ICD-10-CM

## 2013-05-04 NOTE — Progress Notes (Signed)
Patient ID: [name], male   DOB: June 20, 1976, 37 y.o.   MRN: [id number] D: Observed patient in bed sleeping. Pt had an injection in the afternoon and has been sleeping ever since. Respiration is regular and unlabored. No acute distressed noted at this time.   A: 15 minutes checks for safety.  R: Patient is safe. Continue current POC.

## 2013-05-04 NOTE — [facility] Suicide Risk Assessment (Signed)
Suicide Risk Assessment  Admission Assessment     Nursing information obtained from:    Demographic factors:    Current Mental Status:    Loss Factors:    Historical Factors:    Risk Reduction Factors:     CLINICAL FACTORS:   Severe Anxiety and/or Agitation Alcohol/Substance Abuse/Dependencies Schizophrenia:   Paranoid or undifferentiated type Currently Psychotic Previous Psychiatric Diagnoses and Treatments  COGNITIVE FEATURES THAT CONTRIBUTE TO RISK:  Closed-mindedness Polarized thinking    SUICIDE RISK:   Mild:  Suicidal ideation of limited frequency, intensity, duration, and specificity.  There are no identifiable plans, no associated intent, mild dysphoria and related symptoms, good self-control (both objective and subjective assessment), few other risk factors, and identifiable protective factors, including available and accessible social support.  PLAN OF CARE:1. Admit for crisis management and stabilization. 2. Medication management to reduce current symptoms to base line and improve the     patient's overall level of functioning 3. Treat health problems as indicated. 4. Develop treatment plan to decrease risk of relapse upon discharge and the need for     readmission. 5. Psycho-social education regarding relapse prevention and self care. 6. Health care follow up as needed for medical problems. 7. Restart home medications where appropriate.   I certify that inpatient services furnished can reasonably be expected to improve the patient's condition.  Sherrica Niehaus,MD 05/04/2013, 10:55 AM

## 2013-05-04 NOTE — Progress Notes (Signed)
Patient Discharge Instructions:  No documentation was sent to [facility].  The Fax number and address cannot be confirmed.  [name], 05/04/2013, 4:26 PM

## 2013-05-04 NOTE — Progress Notes (Signed)
Patient ID: [name], male   DOB: [birth year], 37 y.o.   MRN: [id number] D: Patient stated he is doing well. Pt mood/affect is restless, pacing the hallways and laughs inappropriately. Pt denies SI/HI/AVH and pain. Pt does appear to be responding to internal stimuli. Pt attended evening wrap up group but was unable to sit still and walked in and out of group. Pt denies any needs or concerns. Cooperative with assessment. No acute distressed noted at this time.   A: Met with pt 1:1. Medications administered as prescribed. Writer encouraged pt to discuss feelings. Pt encouraged to come to staff with any question or concerns. 15 minutes checks for safety.   R: Patient remains safe. He is complaint with medications and denies any adverse reaction. Continue current POC.
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
## 2013-05-04 NOTE — [facility] Group Notes (Signed)
[facility] LCSW Group Therapy  05/04/2013  1:05 PM  Type of Therapy:  Group therapy  Participation Level:  Did not attend    Summary of Progress/Problems:  Chaplain was here to lead a group on themes of hope and courage.  [name] 05/04/2013 1:50 PM
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
## 2013-05-04 NOTE — Progress Notes (Signed)
[facility] Group Notes:  (Nursing/MHT/Case Management/Adjunct)  Date:  05/04/2013  Time:  9:17 PM  Type of Therapy:  Group Therapy  Participation Level:  None  Participation Quality:  Walked in and out of the group several times  Affect:  Excited and Laughing  Cognitive:  Lacking  Insight:  None  Engagement in Group:  None  Modes of Intervention:  Socialization and Support  Summary of Progress/Problems: Pt. Had no insight in group activity and walked in and out of the group.  Pt. Was respectful of other participating in the activity.  Sondra Come 05/04/2013, 9:17 PM

## 2013-05-04 NOTE — Progress Notes (Signed)
D:  Pt refused to fill out pt self inventory sheet, denies SI/HI/AVH, laughs inappropriately, paces halls, not going to groups, tangential.   A:  Emotional support provided, Encouraged pt to continue with treatment plan and attend all group activities, q15 min checks maintained for safety.  R:  Pt is anxious, paces halls, isolates in room at times, redirectable.

## 2013-05-04 NOTE — Progress Notes (Signed)
Recreation Therapy Notes  Date: 08.08.2014 Time: 10:30am Location: [address] Dayroom  Group Topic: Coping Skills  Goal Area(s) Addresses:  Patient will effectively express themselves using art. Patient will verbalized benefit of expressing themselves through art.   Behavioral Response: Hallucinating (Auditory), Hyperactive Intervention: Art  Activity: My two worlds. Patients were asked to draw two pictures, one of themselves at admission and one of what they want their world to look like post d/c.   Education: Coping Skills, Discharge Planning   Education Outcome: Needs additional education.   Clinical Observations/Feedback: Patient walked in and out of session repeatedly. Patient would sit briefly upon walking into day room, but would not remain in group session for more than 1 minute. Patient was observed to laugh loudly, but was not involved in conversation with anyone.     Marykay Lex Ellina Sivertsen, LRT/CTRS  Allon Costlow L 05/04/2013 11:53 AM

## 2013-05-04 NOTE — ED Provider Notes (Signed)
Medical screening examination/treatment/procedure(s) were performed by non-physician practitioner and as supervising physician I was immediately available for consultation/collaboration.  Gwendy Boeder H Tehilla Coffel, MD 05/04/13 1506 

## 2013-05-05 MED ORDER — OLANZAPINE 5 MG PO TBDP
5.0000 mg | ORAL_TABLET | Freq: Four times a day (QID) | ORAL | Status: DC | PRN
  Administered 2013-05-05 – 2013-05-06 (×3): 5 mg via ORAL
  Filled 2013-05-05 (×4): qty 1

## 2013-05-05 MED ORDER — LORAZEPAM 1 MG PO TABS
ORAL_TABLET | ORAL | Status: AC
  Filled 2013-05-05: qty 2

## 2013-05-05 MED ORDER — LORAZEPAM 1 MG PO TABS
2.0000 mg | ORAL_TABLET | ORAL | Status: AC
  Administered 2013-05-05: 2 mg via ORAL

## 2013-05-05 MED ORDER — OLANZAPINE 5 MG PO TBDP
5.0000 mg | ORAL_TABLET | ORAL | Status: AC
Start: 2013-05-05 — End: 2013-05-05
  Administered 2013-05-05: 5 mg via ORAL

## 2013-05-05 NOTE — Progress Notes (Signed)
Adult Psychoeducational Group Note  Date:  05/05/2013 Time:  4:40 PM  Group Topic/Focus:  Building Self Esteem:   The Focus of this group is helping patients become aware of the effects of self-esteem on their lives.  Participation Level:  Minimal  Participation Quality:  Intrusive and Inattentive  Affect:  Blunted and Depressed  Cognitive:  Delusional  Insight: None  Engagement in Group:  Off Topic  Modes of Intervention:  Discussion, Education, Socialization and Support  Additional Comments:  Pt was in/out of group multiple times.  He was observed not staying on topic.  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  Toward the end of the group, pt blurted out to a male pt that he was tired of peer telling him he was going to jail.  Pt left the group angry; however, when pt was seen later, he was observed calm.  Gwyndolyn Kaufman 05/05/2013, 4:40 PM

## 2013-05-05 NOTE — [facility] Group Notes (Signed)
[facility] Group Notes: (Clinical Social Work)   05/05/2013      Type of Therapy:  Group Therapy   Participation Level:  Did Not Attend - came into the group room in the middle of discussion, had hole in pants exposing self without his knowledge.  When this was pointed out to him, he initially argued, then left the room.   Ambrose Mantle, LCSW 05/05/2013, 1:18 PM
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
## 2013-05-05 NOTE — Progress Notes (Addendum)
[facility] MD Progress Note  05/05/2013 12:34 PM [name]  MRN:  [id number]  Subjective:  [name] reports, "I'm not doing well right now. I'm just doing what I was told to do. I can't risk getting killed. I'm sleepy"  O: Huy was assessed while lying in his bed. He had both his legs spread apart exposing his private parts. He had a slit in between his scrub pants which led to him exposing himself. He was able to cover up when his attention was called to it. I don't think that he was aware that his pants has a whole on it.  Diagnosis:   Axis I: Schizophrenia, undifferentiated, Cocaine abuse Axis II: Deferred Axis III:  Past Medical History  Diagnosis Date  . Schizophrenic disorder    Axis IV: other psychosocial or environmental problems and Substance issues Axis V: 41-50 serious symptoms  ADL's:  Impaired  Sleep: Good  Appetite:  Fair  Suicidal Ideation:  Plan:  Denies Intent:  Denies Means:  Denies Homicidal Ideation:  Plan:  Denies Intent:  Denies Means:  Denies AEB (as evidenced by):  Psychiatric Specialty Exam: Review of Systems  Constitutional: Negative.   HENT: Negative.   Eyes: Negative.   Respiratory: Negative.   Cardiovascular: Negative.   Gastrointestinal: Negative.   Genitourinary: Negative.   Musculoskeletal: Negative.   Skin: Negative.   Neurological: Negative.   Endo/Heme/Allergies: Negative.   Psychiatric/Behavioral: Positive for depression, hallucinations (Currently being stabilized with medication) and substance abuse (Cocaine abuse). Negative for suicidal ideas and memory loss. The patient has insomnia (Currently being stabillized with medication). The patient is not nervous/anxious.     Blood pressure 135/77, pulse 106, temperature 97.2 F (36.2 C), temperature source Oral, resp. rate 16, height 5\' 11"  (1.803 m), weight 88.905 kg (196 lb).Body mass index is 27.35 kg/(m^2).  General Appearance: Disheveled  Eye Contact::  Poor  Speech:  Clear and  Coherent  Volume:  Normal  Mood:  Dysphoric  Affect:  Restricted  Thought Process:  Disorganized and illogical  Orientation:  Other:  self and place  Thought Content:  Hallucinations: Auditory and Rumination  Suicidal Thoughts:  No  Homicidal Thoughts:  No  Memory:  Immediate;   Fair Recent;   Fair Remote;   Fair  Judgement:  Impaired  Insight:  Lacking  Psychomotor Activity:  Normal  Concentration:  Fair  Recall:  Fair  Akathisia:  No  Handed:  Right  AIMS (if indicated):     Assets:  Desire for Improvement  Sleep:  Number of Hours: 6.75   Current Medications: Current Facility-Administered Medications  Medication Dose Route Frequency Provider Last Rate Last Dose  . acetaminophen (TYLENOL) tablet 650 mg  650 mg Oral Q6H PRN Shuvon Rankin, NP   650 mg at 05/04/13 0945  . alum & mag hydroxide-simeth (MAALOX/MYLANTA) 200-200-20 MG/5ML suspension 30 mL  30 mL Oral Q4H PRN Shuvon Rankin, NP      . divalproex (DEPAKOTE ER) 24 hr tablet 500 mg  500 mg Oral BID Shuvon Rankin, NP   500 mg at 05/05/13 0755  . magnesium hydroxide (MILK OF MAGNESIA) suspension 30 mL  30 mL Oral Daily PRN Shuvon Rankin, NP      . OLANZapine (ZYPREXA) tablet 15 mg  15 mg Oral QHS Shuvon Rankin, NP   15 mg at 05/04/13 2125  . ziprasidone (GEODON) injection 20 mg  20 mg Intramuscular Q12H PRN Shuvon Rankin, NP   20 mg at 05/05/13 0802    Lab  Results: No results found for this or any previous visit (from the past 48 hour(s)).  Physical Findings: AIMS: Facial and Oral Movements Muscles of Facial Expression: None, normal Lips and Perioral Area: None, normal Jaw: None, normal Tongue: None, normal,Extremity Movements Upper (arms, wrists, hands, fingers): None, normal Lower (legs, knees, ankles, toes): None, normal, Trunk Movements Neck, shoulders, hips: None, normal, Overall Severity Severity of abnormal movements (highest score from questions above): None, normal Incapacitation due to abnormal movements:  None, normal Patient's awareness of abnormal movements (rate only patient's report): No Awareness, Dental Status Current problems with teeth and/or dentures?: No Does patient usually wear dentures?: No  CIWA:    COWS:     Treatment Plan Summary: Daily contact with patient to assess and evaluate symptoms and progress in treatment Medication management  Plan:Continue crisis management and stabilization.  Supportive approach/coping skills/relapse prevention. Obtain Depakote levels on 05/07/13 Encouraged out of room, participation in group sessions and application of coping skills when distressed. Will continue to monitor response to/adverse effects of medications in use to assure effectiveness. Continue to monitor mood, behavior and interaction with staff and other patients. Continue current plan of care.  Medical Decision Making Problem Points:  Review of last therapy session (1) and Review of psycho-social stressors (1) Data Points:  Review of medication regiment & side effects (2) Review of new medications or change in dosage (2)  I certify that inpatient services furnished can reasonably be expected to improve the patient's condition.   Armandina Stammer I, PMHNP-BC 05/05/2013, 12:34 PM

## 2013-05-05 NOTE — Progress Notes (Signed)
Nursing progress note ( 7a-7p) D-Patient presents with labile mood and anxious affect, requested and was given prn for agitation. Pt pacing back and forth laughing inappropriate to self,appears to be responding to internal stimuli. Appearance is disheveled and unkempt, appetite is fair.  A- Support and encouragement given, pt had much difficulty staying in group getting up numerous times, needing some redirection. Encouraged to share feelings .  R- Compliant with medications, maintained on q15 checks for safety.

## 2013-05-06 MED ORDER — CHLORPROMAZINE HCL 100 MG PO TABS
100.0000 mg | ORAL_TABLET | Freq: Once | ORAL | Status: AC
  Administered 2013-05-06: 100 mg via ORAL
  Filled 2013-05-06: qty 1

## 2013-05-06 MED ORDER — ZIPRASIDONE MESYLATE 20 MG IM SOLR: 40.0000 mg | Freq: Two times a day (BID) | INTRAMUSCULAR | Status: DC | PRN

## 2013-05-06 MED ORDER — DIVALPROEX SODIUM ER 500 MG PO TB24
750.0000 mg | ORAL_TABLET | Freq: Two times a day (BID) | ORAL | Status: DC
  Administered 2013-05-06 – 2013-05-08 (×4): 750 mg via ORAL
  Filled 2013-05-06: qty 1
  Filled 2013-05-06: qty 3
  Filled 2013-05-06 (×5): qty 1

## 2013-05-06 MED ORDER — CHLORPROMAZINE HCL 50 MG PO TABS
50.0000 mg | ORAL_TABLET | Freq: Four times a day (QID) | ORAL | Status: DC | PRN
  Administered 2013-05-06 – 2013-05-10 (×9): 50 mg via ORAL
  Filled 2013-05-06 (×10): qty 1

## 2013-05-06 MED ORDER — OLANZAPINE 10 MG PO TABS
20.0000 mg | ORAL_TABLET | Freq: Every day | ORAL | Status: DC
  Administered 2013-05-06 – 2013-05-10 (×5): 20 mg via ORAL
  Filled 2013-05-06 (×7): qty 2

## 2013-05-06 MED ORDER — BENZTROPINE MESYLATE 1 MG PO TABS
1.0000 mg | ORAL_TABLET | Freq: Once | ORAL | Status: AC
  Administered 2013-05-06: 1 mg via ORAL
  Filled 2013-05-06: qty 1

## 2013-05-06 MED ORDER — ZIPRASIDONE MESYLATE 20 MG IM SOLR
20.0000 mg | Freq: Once | INTRAMUSCULAR | Status: AC
  Administered 2013-05-06: 20 mg via INTRAMUSCULAR

## 2013-05-06 MED ORDER — OLANZAPINE 10 MG PO TABS
10.0000 mg | ORAL_TABLET | Freq: Every day | ORAL | Status: DC
  Administered 2013-05-07 – 2013-05-11 (×5): 10 mg via ORAL
  Filled 2013-05-06 (×7): qty 1

## 2013-05-06 NOTE — Progress Notes (Signed)
Patient ID: [name], male   DOB: Oct 26, 1975, 37 y.o.   MRN: [id number] D.The patient has been actively hallucinating all evening.  He has been observed talking to himself and laughing out loud inappropriately. He is more restless than usual.  His language has been abusive and offensive. He gets agitated when he sees a particular young male patient. He made a verbal threat directed at him and then came to the medication window and asked for medication for his nerves. A. Medicated with PRN medication for agitation and anxiety. Praised for requesting medication and not acting upon his threat to harm another patient. Staff closely monitored him throughout the evening. R. Compliant with medication. PRN Ativan and Zyprexa Zydis had minimal effect. Will continue to monitor.

## 2013-05-06 NOTE — Progress Notes (Addendum)
Patient ID: [name], male   DOB: 1976-03-02, 37 y.o.   MRN: [id number] [facility] MD Progress Note  05/06/2013 1:08 PM HAEDEN HUDOCK  MRN:  [id number]  Subjective:  [name] reports, "I thought you told me that I was getting discharged today. I'm off drugs, I'm not hearing voices. Why am I here?"  O: Helmuth was observed pacing with the unit hall ways,  being aggressive and confrontational with staff and kicking the trash can in his room and banging his door.   Diagnosis:   Axis I: Schizophrenia, undifferentiated, Cocaine abuse Axis II: Deferred Axis III:  Past Medical History  Diagnosis Date  . Schizophrenic disorder    Axis IV: other psychosocial or environmental problems and Substance issues Axis V: 41-50 serious symptoms  ADL's:  Impaired  Sleep: Good  Appetite:  Fair  Suicidal Ideation:  Plan:  Denies Intent:  Denies Means:  Denies Homicidal Ideation:  Plan:  Denies Intent:  Denies Means:  Denies AEB (as evidenced by):  Psychiatric Specialty Exam: Review of Systems  Constitutional: Negative.   HENT: Negative.   Eyes: Negative.   Respiratory: Negative.   Cardiovascular: Negative.   Gastrointestinal: Negative.   Genitourinary: Negative.   Musculoskeletal: Negative.   Skin: Negative.   Neurological: Negative.   Endo/Heme/Allergies: Negative.   Psychiatric/Behavioral: Positive for depression, hallucinations (Currently being stabilized with medication) and substance abuse (Cocaine abuse). Negative for suicidal ideas and memory loss. The patient has insomnia (Currently being stabillized with medication). The patient is not nervous/anxious.     Blood pressure 120/80, pulse 123, temperature 97.6 F (36.4 C), temperature source Oral, resp. rate 16, height 5\' 11"  (1.803 m), weight 88.905 kg (196 lb).Body mass index is 27.35 kg/(m^2).  General Appearance: Disheveled  Eye Contact::  Poor  Speech:  Clear and Coherent  Volume:  Normal  Mood:  Dysphoric  Affect:  Restricted   Thought Process:  Disorganized and illogical  Orientation:  Other:  self and place  Thought Content:  Hallucinations: Auditory and Rumination  Suicidal Thoughts:  No  Homicidal Thoughts:  No  Memory:  Immediate;   Fair Recent;   Fair Remote;   Fair  Judgement:  Impaired  Insight:  Lacking  Psychomotor Activity:  Normal  Concentration:  Fair  Recall:  Fair  Akathisia:  No  Handed:  Right  AIMS (if indicated):     Assets:  Desire for Improvement  Sleep:  Number of Hours: 6.75   Current Medications: Current Facility-Administered Medications  Medication Dose Route Frequency Provider Last Rate Last Dose  . acetaminophen (TYLENOL) tablet 650 mg  650 mg Oral Q6H PRN Shuvon Rankin, NP   650 mg at 05/04/13 0945  . alum & mag hydroxide-simeth (MAALOX/MYLANTA) 200-200-20 MG/5ML suspension 30 mL  30 mL Oral Q4H PRN Shuvon Rankin, NP      . benztropine (COGENTIN) tablet 1 mg  1 mg Oral Once Sanjuana Kava, NP      . chlorproMAZINE (THORAZINE) tablet 100 mg  100 mg Oral Once Sanjuana Kava, NP      . divalproex (DEPAKOTE ER) 24 hr tablet 500 mg  500 mg Oral BID Shuvon Rankin, NP   500 mg at 05/06/13 0749  . magnesium hydroxide (MILK OF MAGNESIA) suspension 30 mL  30 mL Oral Daily PRN Shuvon Rankin, NP      . OLANZapine (ZYPREXA) tablet 15 mg  15 mg Oral QHS Shuvon Rankin, NP   15 mg at 05/05/13 2122  . OLANZapine zydis (  ZYPREXA) disintegrating tablet 5 mg  5 mg Oral Q6H PRN Sanjuana Kava, NP   5 mg at 05/06/13 1034    Lab Results: No results found for this or any previous visit (from the past 48 hour(s)).  Physical Findings: AIMS: Facial and Oral Movements Muscles of Facial Expression: None, normal Lips and Perioral Area: None, normal Jaw: None, normal Tongue: None, normal,Extremity Movements Upper (arms, wrists, hands, fingers): None, normal Lower (legs, knees, ankles, toes): None, normal, Trunk Movements Neck, shoulders, hips: None, normal, Overall Severity Severity of abnormal  movements (highest score from questions above): None, normal Incapacitation due to abnormal movements: None, normal Patient's awareness of abnormal movements (rate only patient's report): No Awareness, Dental Status Current problems with teeth and/or dentures?: No Does patient usually wear dentures?: No  CIWA:    COWS:     Treatment Plan Summary: Daily contact with patient to assess and evaluate symptoms and progress in treatment Medication management  Plan:Continue crisis management and stabilization.  Supportive approach/coping skills/relapse prevention. Obtain Depakote levels on 05/07/13. Discontinue Geodon IM, Increased Zyprexa to 10 mg in am, and 20 mg Q bedtime. Thorazine 100 mg x once and 50 mg Q 6 hours prn for agitations.  Add Cogentin 1 mg for prevention of EPS. Increase Depakote ER from 500 mg to 750 mg bid for mood stabilization. Encouraged out of room, participation in group sessions and application of coping skills when distressed. Will continue to monitor response to/adverse effects of medications in use to assure effectiveness. Continue to monitor mood, behavior and interaction with staff and other patients. Continue current plan of care.  Medical Decision Making Problem Points:  Review of last therapy session (1) and Review of psycho-social stressors (1) Data Points:  Review of medication regiment & side effects (2) Review of new medications or change in dosage (2)  I certify that inpatient services furnished can reasonably be expected to improve the patient's condition.   Armandina Stammer I, PMHNP-BC 05/06/2013, 1:08 PM

## 2013-05-06 NOTE — Progress Notes (Signed)
Nursing Progress note : (7a-7p) D- Patient presents with labile mood, agitated requested and was given geodon IM."I have to go the Doctors keeping and I need to leave " pt was pacing back and forth, unable to stay in group. Pt becomes explosive with little warning.  A- Encourage to share what's making him upset, remains guarded with glaze eyes. Pt. became intrusive telling Clinical research associate "I know how to take a goddam  shit and shower and don't need help from you."  R-Continue to monitor on q 15 checks, Received and was given new order for geodon,thorizine and cogentin. Pt also received zyprexa for agitation and anxiety is compliant with medications Pt was pacing back and forth encouraged pt. to stay on unit and take PRN of thorazine after he had another outburst with staff.

## 2013-05-06 NOTE — Progress Notes (Signed)
Adult Psychoeducational Group Note  Date:  05/06/2013 Time:  9:57 PM  Group Topic/Focus:  Wrap-Up Group:   The focus of this group is to help patients review their daily goal of treatment and discuss progress on daily workbooks.  Participation Level:  Minimal  Participation Quality:  Redirectable  Affect:  Irritable  Cognitive:  Lacking  Insight: Lacking  Engagement in Group:  Poor  Modes of Intervention:  Support  Additional Comments:  Patient attended and participated in group. He reports having a good day. He went to the gym and took his medications. He advise that the Act Team was his support system.  Lita Mains [facility] 05/06/2013, 9:57 PM

## 2013-05-06 NOTE — [facility] Group Notes (Signed)
[facility] Group Notes: (Clinical Social Work)   05/06/2013      Type of Therapy:  Group Therapy   Participation Level:  Did Not Attend    Ambrose Mantle, LCSW 05/06/2013, 12:49 PM

## 2013-05-07 DIAGNOSIS — F1410 Cocaine abuse, uncomplicated: Secondary | ICD-10-CM

## 2013-05-07 NOTE — [facility] Group Notes (Signed)
[facility] LCSW Aftercare Discharge Planning Group Note   05/07/2013 7:54 AM  Participation Quality:  Did not attend     [place]

## 2013-05-07 NOTE — Progress Notes (Signed)
Patient ID: [name], male   DOB: 1976-03-05, 37 y.o.   MRN: [id number]     [facility] MD Progress Note  05/07/2013 1:08 PM [name]  MRN:  [id number]  Subjective:  Patient states "There is nothing wrong with me. I need to leave here. I should have already been gone."   O: Patient observed pacing in the halls today. Patient remains labile with the potential to be aggressive with staff due to his fixation about having no reason to be in the hospital. He continues to respond to internal stimuli as evidenced by inappropriate laughter to himself.   Diagnosis:   Axis I: Schizophrenia, undifferentiated, Cocaine abuse Axis II: Deferred Axis III:  Past Medical History  Diagnosis Date  . Schizophrenic disorder    Axis IV: other psychosocial or environmental problems and Substance issues Axis V: 41-50 serious symptoms  ADL's:  Impaired  Sleep: Good  Appetite:  Fair  Suicidal Ideation:  Plan:  Denies Intent:  Denies Means:  Denies Homicidal Ideation:  Plan:  Denies Intent:  Denies Means:  Denies AEB (as evidenced by):  Psychiatric Specialty Exam: Review of Systems  Constitutional: Negative.   HENT: Negative.   Eyes: Negative.   Respiratory: Negative.   Cardiovascular: Negative.   Gastrointestinal: Negative.   Genitourinary: Negative.   Musculoskeletal: Negative.   Skin: Negative.   Neurological: Negative.   Endo/Heme/Allergies: Negative.   Psychiatric/Behavioral: Positive for depression, hallucinations (Currently being stabilized with medication) and substance abuse (Cocaine abuse). Negative for suicidal ideas and memory loss. The patient has insomnia (Currently being stabillized with medication). The patient is not nervous/anxious.     Blood pressure 127/90, pulse 121, temperature 97.4 F (36.3 C), temperature source Oral, resp. rate 20, height 5\' 11"  (1.803 m), weight 88.905 kg (196 lb).Body mass index is 27.35 kg/(m^2).  General Appearance: Disheveled  Eye Contact::   Poor  Speech:  Clear and Coherent  Volume:  Normal  Mood:  Dysphoric  Affect:  Restricted  Thought Process:  Disorganized and illogical  Orientation:  Other:  self and place  Thought Content:  Hallucinations: Auditory and Rumination  Suicidal Thoughts:  No  Homicidal Thoughts:  No  Memory:  Immediate;   Fair Recent;   Fair Remote;   Fair  Judgement:  Impaired  Insight:  Lacking  Psychomotor Activity:  Normal  Concentration:  Fair  Recall:  Fair  Akathisia:  No  Handed:  Right  AIMS (if indicated):     Assets:  Desire for Improvement  Sleep:  Number of Hours: 4.5   Current Medications: Current Facility-Administered Medications  Medication Dose Route Frequency Provider Last Rate Last Dose  . acetaminophen (TYLENOL) tablet 650 mg  650 mg Oral Q6H PRN Shuvon Rankin, NP   650 mg at 05/04/13 0945  . alum & mag hydroxide-simeth (MAALOX/MYLANTA) 200-200-20 MG/5ML suspension 30 mL  30 mL Oral Q4H PRN Shuvon Rankin, NP      . chlorproMAZINE (THORAZINE) tablet 50 mg  50 mg Oral Q6H PRN Sanjuana Kava, NP   50 mg at 05/06/13 2336  . divalproex (DEPAKOTE ER) 24 hr tablet 750 mg  750 mg Oral BID Sanjuana Kava, NP   750 mg at 05/07/13 0824  . magnesium hydroxide (MILK OF MAGNESIA) suspension 30 mL  30 mL Oral Daily PRN Shuvon Rankin, NP      . OLANZapine (ZYPREXA) tablet 10 mg  10 mg Oral Q0600 Sanjuana Kava, NP   10 mg at 05/07/13 0700  .  OLANZapine (ZYPREXA) tablet 20 mg  20 mg Oral QHS Sanjuana Kava, NP   20 mg at 05/06/13 2117    Lab Results: No results found for this or any previous visit (from the past 48 hour(s)).  Physical Findings: AIMS: Facial and Oral Movements Muscles of Facial Expression: None, normal Lips and Perioral Area: None, normal Jaw: None, normal Tongue: None, normal,Extremity Movements Upper (arms, wrists, hands, fingers): None, normal Lower (legs, knees, ankles, toes): None, normal, Trunk Movements Neck, shoulders, hips: None, normal, Overall Severity Severity  of abnormal movements (highest score from questions above): None, normal Incapacitation due to abnormal movements: None, normal Patient's awareness of abnormal movements (rate only patient's report): No Awareness, Dental Status Current problems with teeth and/or dentures?: No Does patient usually wear dentures?: No  CIWA:    COWS:     Treatment Plan Summary: Daily contact with patient to assess and evaluate symptoms and progress in treatment Medication management  Plan:Continue crisis management and stabilization.  Supportive approach/coping skills/relapse prevention. Patient had several medication increases over the weekend due to aggressive behaviors. Continue Zyprexa 10 mg every am and 20 mg at bedtime for psychosis. Continue Depakote 750 mg BID for improved mood stability and Thorazine 50 mg every six hours as needed for agitation.  Encouraged out of room, participation in group sessions and application of coping skills when distressed. Will continue to monitor response to/adverse effects of medications in use to assure effectiveness. Continue to monitor mood, behavior and interaction with staff and other patients. Continue current plan of care.  Medical Decision Making Problem Points:  Review of last therapy session (1) and Review of psycho-social stressors (1) Data Points:  Review of medication regiment & side effects (2) Review of new medications or change in dosage (2)  I certify that inpatient services furnished can reasonably be expected to improve the patient's condition.   Fransisca Kaufmann, NP-C 05/07/2013, 1:08 PM

## 2013-05-07 NOTE — Progress Notes (Signed)
Pt. Was in the dayroom when writer attempted the first assessment.  Pt. Followed writer out to talk about his day.  Pt. Went into his room and started talking loud about how he needed to get out of [facility]. Pt. stated that he came in voluntarily and he had lots of money but lived beneath his means because he wanted to make sure it did not run out.  Pt. Was demanding to be discharged because this was driving him crazy being shut-up in a hospital and we were holding him against his will.  Writer continued to talk with the pt. About his medications and how they were helping him.  Pt. Agreed that the medications were helping him but he had been here for 1 1/2 weeks and it was time for him to go.  Pt. Has actually been at [facility] for appr. 4 days but became angry when I attempted to correct him on this so writer allowed pt. To vent and he quietly went back to the dayroom without incident .   Pt. attended wrap up group and was smiling when writer ckd on him.  Pt. Denies SI and HI, pt. Also denies A or VH.

## 2013-05-07 NOTE — Progress Notes (Signed)
Patient ID: [name], male   DOB: 11/29/1975, 37 y.o.   MRN: [id number] D. The patient is labile and responding to auditory hallucinations . He laughs out loud in response to something he hears and a few minutes later lashes out in angry outbursts. His language is offensive laced with obscenities and racial slurs. The patient paces and fidgets when he attempts to sit.  A. Redirection needed frequently. Medicated for agitation and auditory hallucinations. Verbal support given. R. The patient was compliant with medication. He attempted to attend evening group. Changed seats several times and then sat on the floor. He could not engage appropriately or focus on the topic. He eventually left the group early.

## 2013-05-07 NOTE — Care Management Utilization Note (Signed)
   Per State Regulation 482.30  This chart was reviewed for necessity with respect to the patient's Admission/ Duration of stay.  Next review date:  05/10/13  Mikesha Migliaccio Morrison RN, BSN 

## 2013-05-07 NOTE — Progress Notes (Addendum)
Patient ID: [name], male   DOB: 1976-01-22, 37 y.o.   MRN: [id number] D: Patient is subdued and less agitated today.  He has been appropriate and has not had any outbursts on the unit today.  He did ask for something for agitation before he went outside and he was given 50 mg of thorazine which was effective.  Patient has been attending groups and has been appropriate today.  He has not threatened any patient or staff member.  Patient did not get upset when he was told that he was not being discharged today.  He is being compliant with his medications.  He remains with disorganized thought processes and appears to be responding to internal stimuli.  He denies any SI/HI. Patient has flat, blunted affect.  A: Continue to monitor medication management and MD orders.  Safety checks completed every 15 minutes per protocol.  R: Patient's behavior is improved today.  Update:  Patient become agitated with another patient from another hall.  Patient has been breaking in meal line and upsetting other patients.  He was using foul language.

## 2013-05-07 NOTE — [facility] Group Notes (Signed)
[facility] LCSW Group Therapy  05/07/2013 1:15 pm  Type of Therapy: Process Group Therapy  Participation Level:  Active  Participation Quality:  Appropriate  Affect:  Flat  Cognitive:  Oriented  Insight:  Improving  Engagement in Group:  Limited  Engagement in Therapy:  Limited  Modes of Intervention:  Activity, Clarification, Education, Problem-solving and Support  Summary of Progress/Problems: Today's group addressed the issue of overcoming obstacles.  Patients were asked to identify their biggest obstacle post d/c that stands in the way of their on-going success, and then problem solve as to how to manage this.  Anchor is more subdued today than he was on Friday.  He is still making psychosis induced statements, but is no longer laughing loudly to himself, and is able to sit for a 15 minute period without having to get up and move or pace.  Ida Rogue 05/07/2013   2:46 PM

## 2013-05-07 NOTE — Progress Notes (Signed)
Recreation Therapy Notes   Date: 08.11.2014 Time: 9:30am Location: [address] Dayroom  Group Topic: Coping Skills  Goal Area(s) Addresses:  Patient will identify emotions present within them.  Patient will verbalize importance of knowing what emotions they are feeling.  Behavioral Response: Did not attend.   Marykay Lex Carrington Olazabal, LRT/CTRS  Alexys Gassett L 05/07/2013 12:30 PM

## 2013-05-08 DIAGNOSIS — F259 Schizoaffective disorder, unspecified: Secondary | ICD-10-CM | POA: Diagnosis not present

## 2013-05-08 MED ORDER — DIVALPROEX SODIUM ER 500 MG PO TB24
1000.0000 mg | ORAL_TABLET | Freq: Two times a day (BID) | ORAL | Status: DC
  Administered 2013-05-08 – 2013-05-11 (×6): 1000 mg via ORAL
  Filled 2013-05-08 (×2): qty 2
  Filled 2013-05-08: qty 8
  Filled 2013-05-08 (×2): qty 2
  Filled 2013-05-08: qty 8
  Filled 2013-05-08: qty 2
  Filled 2013-05-08: qty 8
  Filled 2013-05-08 (×2): qty 2
  Filled 2013-05-08: qty 8
  Filled 2013-05-08: qty 2

## 2013-05-08 NOTE — Tx Team (Signed)
  Interdisciplinary Treatment Plan Update   Date Reviewed:  05/08/2013  Time Reviewed:  10:17 AM  Progress in Treatment:   Attending groups: Yes Participating in groups: Yes Taking medication as prescribed: Yes  Tolerating medication: Yes Family/Significant other contact made: No  Patient understands diagnosis: Yes  Discussing patient identified problems/goals with staff: Yes Medical problems stabilized or resolved: Yes Denies suicidal/homicidal ideation: Yes  "I'm not suicidal.  Why are y'all not releasing me/" Patient has not harmed self or others: Yes  For review of initial/current patient goals, please see plan of care.  Estimated Length of Stay:  4-5 days  Reason for Continuation of Hospitalization: Medication stabilization Other; describe agitation, labile mood  New Problems/Goals identified:  N/A  Discharge Plan or Barriers:   unknown, have not heard back from community worker  Additional Comments:  Patient has become more easily agitated, labile, paranoid, assaultive and aggressive towards the staffs and peers. His thought process remains disorganized. He has poor insight and showing poor judgment. He is unable to engage in any meaningful conversation with staffs and peers. He is demanding to be discharged home and making threatening remarks.Patient has not shown any significant improvement despite taking his medications regularly.   Attendees:  Signature: Thedore Mins, MD 05/08/2013 10:17 AM   Signature: Richelle Ito, LCSW 05/08/2013 10:17 AM  Signature: Fransisca Kaufmann, NP 05/08/2013 10:17 AM  Signature: Joslyn Devon, RN 05/08/2013 10:17 AM  Signature: Liborio Nixon, RN 05/08/2013 10:17 AM  Signature:  05/08/2013 10:17 AM  Signature:   05/08/2013 10:17 AM  Signature:    Signature:    Signature:    Signature:    Signature:    Signature:      Scribe for Treatment Team:   Richelle Ito, LCSW  05/08/2013 10:17 AM

## 2013-05-08 NOTE — [facility] Group Notes (Signed)
[facility] LCSW Group Therapy  05/08/2013 , 12:14 PM   Type of Therapy:  Group Therapy  Participation Level:  Active  Participation Quality:  Attentive  Affect:  Appropriate  Cognitive:  Alert  Insight:  Improving  Engagement in Therapy:  Engaged  Modes of Intervention:  Discussion, Exploration and Socialization  Summary of Progress/Problems: Today's group focused on the term Diagnosis.  Participants were asked to define the term, and then pronounce whether it is a negative, positive or neutral term.  [name] sat at the age of his chair the entire group as if he were engaged in an intent discussion.  However, he offered no input until wrap up when we were suggesting ideas of stress relief, and [name] contributed sex, which brought a good laugh from the rest of the group members.  [name] B 05/08/2013 , 12:14 PM

## 2013-05-08 NOTE — Progress Notes (Signed)
Patient ID: [name], male   DOB: 1976-02-09, 37 y.o.   MRN: [id number]  [facility] MD Progress Note  05/08/2013 11:24 AM [name]  MRN:  [id number] Subjective:   "I don't need to be here. I want to go home, I am not suicidal and I only came to the hospital because I was going through stress."  Objective: Patient has become more easily agitated, labile, paranoid, assaultive and aggressive towards the staffs and peers. His thought process remains disorganized. He has poor insight and showing poor judgment. He is unable to engage in any meaningful conversation with staffs and peers. He is demanding to be discharged home and making threatening remarks.Patient has not shown any significant improvement despite taking his medications regularly.  Diagnosis:   Axis I: Schizoaffective disorder-bipolar type. Axis II: Deferred Axis III:  Past Medical History  Diagnosis Date   Axis IV: housing problems, occupational problems, problems related to social environment and problems with primary support group Axis V: 41-50 serious symptoms  ADL's:  Intact  Sleep: Good  Appetite:  Good  Suicidal Ideation:  Denies Homicidal Ideation:  Denies AEB (as evidenced by):  Psychiatric Specialty Exam: Review of Systems  Constitutional: Negative.   HENT: Negative.   Eyes: Negative.   Respiratory: Negative.   Cardiovascular: Negative.   Gastrointestinal: Negative.   Genitourinary: Negative.   Musculoskeletal: Negative.   Skin: Negative.   Neurological: Negative.   Endo/Heme/Allergies: Negative.   Psychiatric/Behavioral: Positive for hallucinations. Negative for depression, suicidal ideas, memory loss and substance abuse. The patient is nervous/anxious. The patient does not have insomnia.     Blood pressure 121/72, pulse 70, temperature 97.2 F (36.2 C), temperature source Oral, resp. rate 20, height 5\' 11"  (1.803 m), weight 88.905 kg (196 lb).Body mass index is 27.35 kg/(m^2).  General Appearance:  Disheveled  Eye Contact::  Poor  Speech:  Pressured  Volume:  Increased  Mood:  Angry and Irritable  Affect:  Labile  Thought Process:  Irrelevant  Orientation:  Full (Time, Place, and Person)  Thought Content:  Obsessions  Suicidal Thoughts:  No  Homicidal Thoughts:  No  Memory:  Immediate;   Fair Recent;   Fair Remote;   Fair  Judgement:  Poor  Insight:  Lacking  Psychomotor Activity:  Increased  Concentration:  Poor  Recall:  Poor  Akathisia:  No  Handed:  Right  AIMS (if indicated):     Assets:  Communication Skills Desire for Improvement Leisure Time Physical Health  Sleep:  Number of Hours: 6.5   Current Medications: Current Facility-Administered Medications  Medication Dose Route Frequency Provider Last Rate Last Dose  . acetaminophen (TYLENOL) tablet 650 mg  650 mg Oral Q6H PRN Shuvon Rankin, NP   650 mg at 05/04/13 0945  . alum & mag hydroxide-simeth (MAALOX/MYLANTA) 200-200-20 MG/5ML suspension 30 mL  30 mL Oral Q4H PRN Shuvon Rankin, NP      . chlorproMAZINE (THORAZINE) tablet 50 mg  50 mg Oral Q6H PRN Sanjuana Kava, NP   50 mg at 05/08/13 1038  . divalproex (DEPAKOTE ER) 24 hr tablet 1,000 mg  1,000 mg Oral BID Paco Cislo      . magnesium hydroxide (MILK OF MAGNESIA) suspension 30 mL  30 mL Oral Daily PRN Shuvon Rankin, NP      . OLANZapine (ZYPREXA) tablet 10 mg  10 mg Oral Q0600 Sanjuana Kava, NP   10 mg at 05/08/13 0643  . OLANZapine (ZYPREXA) tablet 20 mg  20  mg Oral QHS Sanjuana Kava, NP   20 mg at 05/07/13 2129    Lab Results:  Results for orders placed during the hospital encounter of 05/03/13 (from the past 48 hour(s))  VALPROIC ACID LEVEL     Status: Abnormal   Collection Time    05/07/13  7:34 PM      Result Value Range   Valproic Acid Lvl 49.6 (*) 50.0 - 100.0 ug/mL   Comment: Performed at [facility]    Physical Findings: AIMS: Facial and Oral Movements Muscles of Facial Expression: None, normal Lips and Perioral Area: None,  normal Jaw: None, normal Tongue: None, normal,Extremity Movements Upper (arms, wrists, hands, fingers): None, normal Lower (legs, knees, ankles, toes): None, normal, Trunk Movements Neck, shoulders, hips: None, normal, Overall Severity Severity of abnormal movements (highest score from questions above): None, normal Incapacitation due to abnormal movements: None, normal Patient's awareness of abnormal movements (rate only patient's report): No Awareness, Dental Status Current problems with teeth and/or dentures?: No Does patient usually wear dentures?: No  CIWA:    COWS:     Treatment Plan Summary: Daily contact with patient to assess and evaluate symptoms and progress in treatment Medication management  Plan: Continue crisis management and stabilization.  Continue Zyprexa 10 mg po qam and 20mg  po Qhs for psychosis and delusions. Increase  Depakote to 1000 mg ER BID for mood stabilization.  Encouraged patient to attend groups and participate in group counseling sessions and activities.  Discharge plan in progress.  Address health issues: Vitals reviewed and stable.  Case manager with work on Transferring him to [facility] due to lack of improvement.  Medical Decision Making Problem Points:  Review of psycho-social stressors (1) Data Points:  Review of medication regiment & side effects (2)  I certify that inpatient services furnished can reasonably be expected to improve the patient's condition.   Thedore Mins, MD 05/08/2013, 11:24 AM

## 2013-05-08 NOTE — Progress Notes (Signed)
Patient ID: [name], male   DOB: Feb 06, 1976, 37 y.o.   MRN: [id number] D: Patient is aggressive and agitated today.  Patient came to medication window and stated, "I want out of here today; I am being held hostage against my will and it ain't right!"  Patient proceeded to take medication and grabbed this nurse's hand in an aggressive manner.  Patient was advised not to do that because that was considered a threat to staff and if his behavior continued, we would take action to prevent the threat.  Patient stated, "I don't care what you do!"  He spit his meds in his water and it is questionable whether he even took them.  He refused any medication to help with his aggression or anxiety. A:  Patient will be monitored closely for any escalating behavior and will proceed as necessary.  Patient is in his room at the moment. Patient was informed he can talk to someone today concerning a possible discharge date.  Continue to monitor patient's behavior.  Patient is not able to go to cafeteria or outside today due to behavior. R:  Patient has poor insight.

## 2013-05-08 NOTE — Progress Notes (Addendum)
Pt.  Occupational psychologist as soon as I got on the unit this pm.  Writer ask pt. About his day and he reported a good day today.  Pt. Started talking about getting upset in the cafeteria yesterday and making excuses for it stating that he was better and it would not happen again.  Pt. Ask Clinical research associate when he would go home, Clinical research associate told pt. That she had no idea of the date he would leave and he ask if there was a date mentioned Clinical research associate informed pt. The tentative date had been 08/13 but the discharge date depends on his progress at [facility].   Pt. Then stated that he was much better and did not need to go to another hospital, that he feels he is ready to go home.  Pt. attended wrap up group and so far puts a big smile on his face every time he sees Clinical research associate.   Will continue to monitor throughout the night.  Pt. Denies SI and HI, A and or VH.  NO complaints of pain or discomfort noted.  Pt. Bathed and Shaved, has been polite to peers and staff accepted his medications and requested a PRN thorazine, writer told pt. That he had requested on at 1830 and could not give him another one until 0030.  Pt. Smiled and said "oh yeah, I did that's okay".  Pt. Has shown that he can control his behavior when it suits him.  He has been exceptionally nice in hopes that he will be discharged and not sent to another facility.

## 2013-05-09 DIAGNOSIS — F259 Schizoaffective disorder, unspecified: Secondary | ICD-10-CM | POA: Diagnosis not present

## 2013-05-09 NOTE — Progress Notes (Signed)
The focus of this group is to help patients review their daily goal of treatment and discuss progress on daily workbooks. Pt attended the evening group session and responded to discussion prompts from the Writer. Pt reported having a good day because  He felt like "the nerve pills" were helping him. Pt changed topics rapidly and began complaining that he did not want to be studied. When the Writer tried to better understand to what the Pt was referring to, Pt responded several times with "End of subject." Pt reported having no additional needs from Nursing Staff this evening. Pt's affect fluctuated between flat and slightly anxious.

## 2013-05-09 NOTE — Progress Notes (Signed)
Patient ID: [name], male   DOB: July 27, 1976, 37 y.o.   MRN: [id number] D: Pt. In dayroom watching TV. Pt. Reports "not stressed anymore" "just needed some nerve pills" Pt. Denies AVH. A: Writer introduced self to client and reviewed med administration times. Writer encouraged group. Staff will monitor q68min for safety. R: Pt. Is safe on the unit. Pt. Attended group.

## 2013-05-09 NOTE — Progress Notes (Signed)
Patient ID: [name], male   DOB: Feb 15, 1976, 37 y.o.   MRN: [id number]  [facility] MD Progress Note  05/09/2013 10:58 AM [name]  MRN:  [id number] Subjective:   "I am not feeling so angry to day , but I still want to go home."  Objective: Patient reports decreased agitation and mood lability today. However, he remains paranoid, psychosis with disorganized thought process. He is still not able to engage in any meaningful conversation with staffs and peers. He is demanding to be discharged home but says he has no where to stay. He reports that prior to his admission he was staying in an hotel but ended thrashing the hotel room before he came to the hospital. He is compliant with his medications and has not endorsed any adverse reactions. Diagnosis:   Axis I: Schizoaffective disorder-bipolar type. Axis II: Deferred Axis III:  Past Medical History  Diagnosis Date   Axis IV: housing problems, occupational problems, problems related to social environment and problems with primary support group Axis V: 41-50 serious symptoms  ADL's:  Intact  Sleep: Good  Appetite:  Good  Suicidal Ideation:  Denies Homicidal Ideation:  Denies AEB (as evidenced by):  Psychiatric Specialty Exam: Review of Systems  Constitutional: Negative.   HENT: Negative.   Eyes: Negative.   Respiratory: Negative.   Cardiovascular: Negative.   Gastrointestinal: Negative.   Genitourinary: Negative.   Musculoskeletal: Negative.   Skin: Negative.   Neurological: Negative.   Endo/Heme/Allergies: Negative.   Psychiatric/Behavioral: Positive for hallucinations. Negative for depression, suicidal ideas, memory loss and substance abuse. The patient is nervous/anxious. The patient does not have insomnia.     Blood pressure 109/79, pulse 110, temperature 97.1 F (36.2 C), temperature source Oral, resp. rate 18, height 5\' 11"  (1.803 m), weight 88.905 kg (196 lb).Body mass index is 27.35 kg/(m^2).  General Appearance:  Disheveled  Eye Contact::  Poor  Speech:  Pressured  Volume:  Increased  Mood:  Angry and Irritable  Affect:  Labile  Thought Process:  Irrelevant  Orientation:  Full (Time, Place, and Person)  Thought Content:  Obsessions  Suicidal Thoughts:  No  Homicidal Thoughts:  No  Memory:  Immediate;   Fair Recent;   Fair Remote;   Fair  Judgement:  Poor  Insight:  Lacking  Psychomotor Activity:  Increased  Concentration:  Poor  Recall:  Poor  Akathisia:  No  Handed:  Right  AIMS (if indicated):     Assets:  Communication Skills Desire for Improvement Leisure Time Physical Health  Sleep:  Number of Hours: 6.25   Current Medications: Current Facility-Administered Medications  Medication Dose Route Frequency Provider Last Rate Last Dose  . acetaminophen (TYLENOL) tablet 650 mg  650 mg Oral Q6H PRN Shuvon Rankin, NP   650 mg at 05/08/13 1452  . alum & mag hydroxide-simeth (MAALOX/MYLANTA) 200-200-20 MG/5ML suspension 30 mL  30 mL Oral Q4H PRN Shuvon Rankin, NP      . chlorproMAZINE (THORAZINE) tablet 50 mg  50 mg Oral Q6H PRN Sanjuana Kava, NP   50 mg at 05/09/13 1002  . divalproex (DEPAKOTE ER) 24 hr tablet 1,000 mg  1,000 mg Oral BID Nitya Cauthon   1,000 mg at 05/09/13 0749  . magnesium hydroxide (MILK OF MAGNESIA) suspension 30 mL  30 mL Oral Daily PRN Shuvon Rankin, NP      . OLANZapine (ZYPREXA) tablet 10 mg  10 mg Oral Q0600 Sanjuana Kava, NP   10 mg at  05/09/13 0615  . OLANZapine (ZYPREXA) tablet 20 mg  20 mg Oral QHS Sanjuana Kava, NP   20 mg at 05/08/13 2128    Lab Results:  Results for orders placed during the hospital encounter of 05/03/13 (from the past 48 hour(s))  VALPROIC ACID LEVEL     Status: Abnormal   Collection Time    05/07/13  7:34 PM      Result Value Range   Valproic Acid Lvl 49.6 (*) 50.0 - 100.0 ug/mL   Comment: Performed at [facility]    Physical Findings: AIMS: Facial and Oral Movements Muscles of Facial Expression: None, normal Lips  and Perioral Area: None, normal Jaw: None, normal Tongue: None, normal,Extremity Movements Upper (arms, wrists, hands, fingers): None, normal Lower (legs, knees, ankles, toes): None, normal, Trunk Movements Neck, shoulders, hips: None, normal, Overall Severity Severity of abnormal movements (highest score from questions above): None, normal Incapacitation due to abnormal movements: None, normal Patient's awareness of abnormal movements (rate only patient's report): No Awareness, Dental Status Current problems with teeth and/or dentures?: No Does patient usually wear dentures?: No  CIWA:    COWS:     Treatment Plan Summary: Daily contact with patient to assess and evaluate symptoms and progress in treatment Medication management  Plan: Continue crisis management and stabilization.  Continue Zyprexa 10 mg po qam and 20mg  po Qhs for psychosis and delusions. Continue Depakote 1000 mg ER BID for mood stabilization.  Encouraged patient to attend groups and participate in group counseling sessions and activities.  Discharge plan in progress.  Address health issues: Vitals reviewed and stable.  Case manager with work on Transferring him to [facility] due to lack of improvement.  Medical Decision Making Problem Points:  Review of psycho-social stressors (1) Data Points:  Review of medication regiment & side effects (2)  I certify that inpatient services furnished can reasonably be expected to improve the patient's condition.   Thedore Mins, MD 05/09/2013, 10:58 AM

## 2013-05-09 NOTE — [facility] Group Notes (Signed)
[facility] [employer] Group Therapy  05/09/2013 , 1:31 PM    Type of Therapy:  [employer] Presentation  Participation Level:  Walked in and out several times-never stayed for more than a minute or 2.    Summary of Progress/Problems:  Onalee Hua from [employer] came to present his recovery story and play the guitar.    [name] 05/09/2013 , 1:31 PM

## 2013-05-09 NOTE — Progress Notes (Signed)
D:  Per pt self inventory pt reports sleeping well, appetite good, energy level high, ability to pay attention good, rates depression at 8 out of 10and hopelessness at a 0 out of 10, denies that he was ever hopeless, denies SI/HI/AVH, wide eyed gaze, hygiene improving, pt shaved last night, denies pain, int c/o anxiety "need something for my nerves"  A:  PRN Zyprexa given per orders, Emotional support provided, Encouraged pt to continue with treatment plan and attend all group activities, q15 min checks maintained for safety.  R:  Pt is calm and cooperative at this time, states this am that he wants to be discharged today, pt is attending groups, pleasant with staff, paces in halls from time to time, has intense stare.

## 2013-05-09 NOTE — [facility] Group Notes (Signed)
Adult Psychoeducational Group Note  Date:  05/09/2013 Time:  3:40 AM  Group Topic/Focus:  Wrap-Up Group:   The focus of this group is to help patients review their daily goal of treatment and discuss progress on daily workbooks.  Participation Level:  Minimal  Participation Quality:  Attentive  Affect:  Flat  Cognitive:  Alert  Insight: Limited  Engagement in Group:  Limited  Modes of Intervention:  Discussion  Additional Comments:  Bosco stated that he had a good day.  He was started on new nerve pills and is doing better, and is no longer suicidal.  He then went off topic talking about he will defend himself if he needs to.  He wishes people would leave him alone.  Lastly, he stated he was ready for discharge.  Caroll Rancher A 05/09/2013, 3:40 AM

## 2013-05-09 NOTE — Progress Notes (Signed)
Recreation Therapy Notes  Date: 08.13.2014 Time: 9:30am Location: [address] Dayroom  Group Topic: Leisure Education  Goal Area(s) Addresses:  Patient will verbalize activity of interest by end of group session. Patient will verbalize the ability to use positive leisure/recreation as a coping mechanism.  Behavioral Response: Engaged, Appropriate, Decreased Hyperactivity, Hallucinating  Intervention: Adapted Game  Activity: Letters of Leisure. Patients were asked to select a card with a letter of the alphabet from LRT. Patients were asked to state a leisure/recreation activity of choice to correspond with the letter chosen. After each patient had chosen a card and identified an activity group members were asked to identify letters not selected individually.   Education:  Leisure Education, Pharmacologist, Discharge Planning  Education Outcome: Needs additional edcuation  Clinical Observations/Feedback:  Patient presented concerned about medication and displaying some hyperactive behaviors, pacing back and forth, walking in and out of room. LRT encouraged patient to discuss concerns with nurse, patient agreed he would do so. Approximately two minutes into group session patient sat down with an ordinary posture and appeared to engage in group discussion, as patient made eye contact with LRT, as well as peers. However patient then burst into laughter, peers had no made a statement to illicit laughter and patient was not engaged in conversation with anyone. Patient contributed to opening discussion, however patient statement did not relate to topic and had religiously connotations. Patient selected a card from LRT and stated an appropriate activity to correspond with letter selected. During group effort to fill in letters not selected patient stated words that began with letters, but was not able to identify leisure/recreation activities. Patient made no contributions to wrap up discussion. Patient was  able to sit through and participate in group session today, which is unlike previous recreation therapy group sessions.   Marykay Lex Danzell Birky, LRT/CTRS  Jearl Klinefelter 05/09/2013 12:42 PM

## 2013-05-10 DIAGNOSIS — F259 Schizoaffective disorder, unspecified: Secondary | ICD-10-CM | POA: Diagnosis not present

## 2013-05-10 NOTE — [facility] Suicide Risk Assessment (Signed)
[facility] INPATIENT:  Family/Significant Other Suicide Prevention Education  Suicide Prevention Education:  Patient Refusal for Family/Significant Other Suicide Prevention Education: The patient [name] has refused to provide written consent for family/significant other to be provided Family/Significant Other Suicide Prevention Education during admission and/or prior to discharge.  Physician notified. "I'm my own person.  I'm not retarded, and I was not suicidal when I came in.  I don't need anyone's help.  I can take care of myself." Ida Rogue 05/10/2013, 3:34 PM

## 2013-05-10 NOTE — Progress Notes (Signed)
Patient ID: [name], male   DOB: 1976/06/08, 37 y.o.   MRN: [id number]  [name] Progress Note  05/10/2013 11:17 AM [name]  MRN:  [id number] Subjective:   "I am ready to go home now, I am no longer suicidal."  Objective: Patient reports that he has been sleeping much better. He endorsed decreased paranoia, agitation and mood lability. His thought process is more organized but still not able to engage in any meaningful conversation with staffs and peers. He is fixated on being discharged but he is homeless. He is compliant with his medications and has not endorsed any adverse reactions. Diagnosis:   Axis I: Schizoaffective disorder-bipolar type. Axis II: Deferred Axis III:  Past Medical History  Diagnosis Date   Axis IV: housing problems, occupational problems, problems related to social environment and problems with primary support group Axis V: 50-60 moderate symptoms  ADL's:  Intact  Sleep: Good  Appetite:  Good  Suicidal Ideation:  Denies Homicidal Ideation:  Denies AEB (as evidenced by):  Psychiatric Specialty Exam: Review of Systems  Constitutional: Negative.   HENT: Negative.   Eyes: Negative.   Respiratory: Negative.   Cardiovascular: Negative.   Gastrointestinal: Negative.   Genitourinary: Negative.   Musculoskeletal: Negative.   Skin: Negative.   Neurological: Negative.   Endo/Heme/Allergies: Negative.   Psychiatric/Behavioral: Positive for hallucinations. Negative for depression, suicidal ideas, memory loss and substance abuse. The patient is nervous/anxious. The patient does not have insomnia.     Blood pressure 126/70, pulse 112, temperature 97.8 F (36.6 C), temperature source Oral, resp. rate 18, height 5\' 11"  (1.803 m), weight 88.905 kg (196 lb).Body mass index is 27.35 kg/(m^2).  General Appearance: fairly groomed  Patent attorney::  fair  Speech:  Pressured  Volume:  Increased  Mood:  dysphoric  Affect:  Full range  Thought Process:   Irrelevant  Orientation:  Full (Time, Place, and Person)  Thought Content:  Obsessions  Suicidal Thoughts:  No  Homicidal Thoughts:  No  Memory:  Immediate;   Fair Recent;   Fair Remote;   Fair  Judgement:  shallow  Insight:  Lacking  Psychomotor Activity:  Increased  Concentration: fair  Recall:  Poor  Akathisia:  No  Handed:  Right  AIMS (if indicated):     Assets:  Communication Skills Desire for Improvement Leisure Time Physical Health  Sleep:  Number of Hours: 6.25   Current Medications: Current Facility-Administered Medications  Medication Dose Route Frequency Provider Last Rate Last Dose  . acetaminophen (TYLENOL) tablet 650 mg  650 mg Oral Q6H PRN Shuvon Rankin, NP   650 mg at 05/08/13 1452  . alum & mag hydroxide-simeth (MAALOX/MYLANTA) 200-200-20 MG/5ML suspension 30 mL  30 mL Oral Q4H PRN Shuvon Rankin, NP      . chlorproMAZINE (THORAZINE) tablet 50 mg  50 mg Oral Q6H PRN Sanjuana Kava, NP   50 mg at 05/09/13 1707  . divalproex (DEPAKOTE ER) 24 hr tablet 1,000 mg  1,000 mg Oral BID Sade Mehlhoff   1,000 mg at 05/10/13 0731  . magnesium hydroxide (MILK OF MAGNESIA) suspension 30 mL  30 mL Oral Daily PRN Shuvon Rankin, NP      . OLANZapine (ZYPREXA) tablet 10 mg  10 mg Oral Q0600 Sanjuana Kava, NP   10 mg at 05/10/13 7829  . OLANZapine (ZYPREXA) tablet 20 mg  20 mg Oral QHS Sanjuana Kava, NP   20 mg at 05/09/13 2131    Lab Results:  No results found for this or any previous visit (from the past 48 hour(s)).  Physical Findings: AIMS: Facial and Oral Movements Muscles of Facial Expression: None, normal Lips and Perioral Area: None, normal Jaw: None, normal Tongue: None, normal,Extremity Movements Upper (arms, wrists, hands, fingers): None, normal Lower (legs, knees, ankles, toes): None, normal, Trunk Movements Neck, shoulders, hips: None, normal, Overall Severity Severity of abnormal movements (highest score from questions above): None, normal Incapacitation  due to abnormal movements: None, normal Patient's awareness of abnormal movements (rate only patient's report): No Awareness, Dental Status Current problems with teeth and/or dentures?: No Does patient usually wear dentures?: No  CIWA:    COWS:     Treatment Plan Summary: Daily contact with patient to assess and evaluate symptoms and progress in treatment Medication management  Plan: Continue crisis management and stabilization.  Continue Zyprexa 10 mg po qam and 20mg  po Qhs for psychosis and delusions. Continue Depakote 1000 mg ER BID for mood stabilization.  Encouraged patient to attend groups and participate in group counseling sessions and activities.  Discharge plan in progress.  Address health issues: Vitals reviewed and stable.  Follow up depakote level. Case manager with work on Transferring him to [employer] due to lack of improvement.  Medical Decision Making Problem Points:  Review of psycho-social stressors (1) Data Points:  Review of medication regiment & side effects (2)  I certify that inpatient services furnished can reasonably be expected to improve the patient's condition.   Thedore Mins, [name] 05/10/2013, 11:17 AM

## 2013-05-10 NOTE — Tx Team (Signed)
  Interdisciplinary Treatment Plan Update   Date Reviewed:  05/10/2013  Time Reviewed:  3:29 PM  Progress in Treatment:   Attending groups: Yes Participating in groups: Yes Taking medication as prescribed: Yes  Tolerating medication: Yes Family/Significant other contact made: Yes  Patient understands diagnosis: Yes  Discussing patient identified problems/goals with staff: Yes Medical problems stabilized or resolved: Yes Denies suicidal/homicidal ideation: Yes Patient has not harmed self or others: Yes  For review of initial/current patient goals, please see plan of care.  Estimated Length of Stay:  D/C tomorrow  Reason for Continuation of Hospitalization:   New Problems/Goals identified:  N/A  Discharge Plan or Barriers:   [name] states he will try to get into the [facility].  If no beds, he will stay on the streets.  He has the option of either reaching out to [facility] ACT team for help, or following up at [facility].  Additional Comments:  Attendees:  Signature: Thedore Mins, MD 05/10/2013 3:29 PM   Signature: Richelle Ito, LCSW 05/10/2013 3:29 PM  Signature: Fransisca Kaufmann, NP 05/10/2013 3:29 PM  Signature: Joslyn Devon, RN 05/10/2013 3:29 PM  Signature:  05/10/2013 3:29 PM  Signature:  05/10/2013 3:29 PM  Signature:   05/10/2013 3:29 PM  Signature:    Signature:    Signature:    Signature:    Signature:    Signature:      Scribe for Treatment Team:   Richelle Ito, LCSW  05/10/2013 3:29 PM

## 2013-05-10 NOTE — [facility] Group Notes (Signed)
[facility] Group Notes:  (Counselor/Nursing/MHT/Case Management/Adjunct)  05/10/2013 1:15PM  Type of Therapy:  Group Therapy  Participation Level:  Minimal  Participation Quality:  Appropriate  Affect:  Flat  Cognitive:  Oriented  Insight:  Improving  Engagement in Group:  Limited  Engagement in Therapy:  Limited  Modes of Intervention:  Discussion, Exploration and Socialization  Summary of Progress/Problems: The topic for group was balance in life.  Pt participated in the discussion about when their life was in balance and out of balance and how this feels.  Pt discussed ways to get back in balance and short term goals they can work on to get where they want to be.  [name] was in and out of group per usual.  Each time he would return he made an off-the-wall comment, which we all ignored, and he would eventually get up and walk out, only to return 8 minutes later.   [name] 05/10/2013 3:20 PM

## 2013-05-10 NOTE — Progress Notes (Signed)
Patient ID: [name], male   DOB: 12/03/1975, 37 y.o.   MRN: [id number] D: Patient's mood has improved today. He is less agitated and angry.  He remains restless and continues to pace the hallways.  He was informed that he was being discharged tomorrow and he took the news very well.  He thanked everyone profusely.  He denies any SI/HI/AVH.  He stated, "nothing is wrong with me; they gave me medication I didn't need."  He remains with disorganized thought processes. He plans to go to a shelter upon discharge.  He also met with an ACT team today that is interested to signing him on. A:  Continue to monitor medication management and MD orders.  Safety checks continued every 15 minutes per protocol. R:  Patient is receptive to staff's redirection.

## 2013-05-11 DIAGNOSIS — F1420 Cocaine dependence, uncomplicated: Secondary | ICD-10-CM

## 2013-05-11 MED ORDER — OLANZAPINE 15 MG PO TABS: 15.0000 mg | ORAL_TABLET | Freq: Two times a day (BID) | ORAL | Status: DC

## 2013-05-11 MED ORDER — DIVALPROEX SODIUM ER 500 MG PO TB24: 1000.0000 mg | ORAL_TABLET | Freq: Two times a day (BID) | ORAL | Status: DC

## 2013-05-11 MED ORDER — OLANZAPINE 7.5 MG PO TABS
15.0000 mg | ORAL_TABLET | Freq: Two times a day (BID) | ORAL | Status: DC
  Filled 2013-05-11 (×4): qty 8

## 2013-05-11 NOTE — [facility] Suicide Risk Assessment (Signed)
Suicide Risk Assessment  Discharge Assessment     Demographic Factors:  Male, Caucasian, Low socioeconomic status and Unemployed  Mental Status Per Nursing Assessment::   On Admission:     Current Mental Status by Physician: patient denies suicidal ideation, intent or plan  Loss Factors: Decrease in vocational status and Financial problems/change in socioeconomic status  Historical Factors: Impulsivity  Risk Reduction Factors:   get disability check  Continued Clinical Symptoms:  Alcohol/Substance Abuse/Dependencies  Cognitive Features That Contribute To Risk:  Closed-mindedness Polarized thinking    Suicide Risk:  Minimal: No identifiable suicidal ideation.  Patients presenting with no risk factors but with morbid ruminations; may be classified as minimal risk based on the severity of the depressive symptoms  Discharge Diagnoses:   AXIS I:  Schizoaffective disorder-bipolar type              Cocaine dependence  AXIS II:  Deferred AXIS III:   Past Medical History  Diagnosis Date   AXIS IV:  other psychosocial or environmental problems and problems related to social environment AXIS V:  61-70 mild symptoms  Plan Of Care/Follow-up recommendations:  Activity:  as tolerated Diet:  healthy Tests:  Valproic acid level-49.5 Other:  patient to keep his after care appointment  Is patient on multiple antipsychotic therapies at discharge:  No   Has Patient had three or more failed trials of antipsychotic monotherapy by history:  No  Recommended Plan for Multiple Antipsychotic Therapies: N/A  Ailen Strauch,MD 05/11/2013, 8:57 AM
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
## 2013-05-11 NOTE — Progress Notes (Signed)
2Patient Discharge Instructions:  After Visit Summary (AVS):   Faxed to:  T Certified Mail to:  8/15 Discharge Summary Note:   Faxed to:  05/10/13 Suicide Risk Assessment - Discharge Assessment:   Faxed to:  05/11/13  Tonna Corner, 05/11/2013, 11:23 AM Faxed to [facility] [phone]

## 2013-05-11 NOTE — Progress Notes (Signed)
Patient ID: [name], male   DOB: 01/04/1976, 37 y.o.   MRN: [id number] Patient presents with irritable mood, labile affect but is cooperative with Clinical research associate. Patient states '' No I'm not hearing any voices any more, that stopped a few days ago, the doctor said I'm going home today and I'm ready to go. I have to leave early so that I can go and meet up with my payee so that I can get my money '' Patient denies any SI/HI/A/V Hallucinations at this time. Orders received for discharge. Discharge instructions reviewed, AVS copy provided along with Rx and 3 day free supply of medications given. Bus Pass provided. All belongings returned. Patient verbalized understanding. No signs of acute decompensation. Aware of crisis services. Patient escorted to lobby with staff.

## 2013-05-14 ENCOUNTER — Encounter (HOSPITAL_COMMUNITY): Payer: Self-pay | Admitting: Rehabilitation

## 2013-05-14 NOTE — Discharge Summary (Signed)
Seen and agreed. Makenley Shimp, MD 

## 2013-05-15 ENCOUNTER — Encounter (HOSPITAL_COMMUNITY): Payer: Self-pay | Admitting: *Deleted

## 2013-05-15 ENCOUNTER — Emergency Department (HOSPITAL_COMMUNITY)
Admission: EM | Admit: 2013-05-15 | Discharge: 2013-05-16 | Disposition: A | Discharge status: Home / Self Care | Payer: Medicare Other | Attending: Emergency Medicine | Admitting: Emergency Medicine

## 2013-05-15 DIAGNOSIS — F17200 Nicotine dependence, unspecified, uncomplicated: Secondary | ICD-10-CM | POA: Diagnosis not present

## 2013-05-15 DIAGNOSIS — S0510XA Contusion of eyeball and orbital tissues, unspecified eye, initial encounter: Secondary | ICD-10-CM | POA: Insufficient documentation

## 2013-05-15 DIAGNOSIS — F200 Paranoid schizophrenia: Secondary | ICD-10-CM | POA: Diagnosis not present

## 2013-05-15 DIAGNOSIS — IMO0002 Reserved for concepts with insufficient information to code with codable children: Secondary | ICD-10-CM | POA: Insufficient documentation

## 2013-05-15 DIAGNOSIS — F29 Unspecified psychosis not due to a substance or known physiological condition: Secondary | ICD-10-CM | POA: Insufficient documentation

## 2013-05-15 DIAGNOSIS — Z9119 Patient's noncompliance with other medical treatment and regimen: Secondary | ICD-10-CM | POA: Insufficient documentation

## 2013-05-15 DIAGNOSIS — R6889 Other general symptoms and signs: Secondary | ICD-10-CM | POA: Diagnosis not present

## 2013-05-15 DIAGNOSIS — Z91199 Patient's noncompliance with other medical treatment and regimen due to unspecified reason: Secondary | ICD-10-CM | POA: Insufficient documentation

## 2013-05-15 LAB — CBC WITH DIFFERENTIAL/PLATELET
Basophils Absolute: 0.1 10*3/uL (ref 0.0–0.1)
Basophils Relative: 1 % (ref 0–1)
Eosinophils Absolute: 0.5 10*3/uL (ref 0.0–0.7)
Eosinophils Relative: 4 % (ref 0–5)
MCH: 31.2 pg (ref 26.0–34.0)
MCHC: 33.9 g/dL (ref 30.0–36.0)
MCV: 92 fL (ref 78.0–100.0)
Neutrophils Relative %: 68 % (ref 43–77)
Platelets: 235 10*3/uL (ref 150–400)
RBC: 4.77 MIL/uL (ref 4.22–5.81)
RDW: 13.5 % (ref 11.5–15.5)

## 2013-05-15 LAB — POCT I-STAT, CHEM 8
Chloride: 104 mEq/L (ref 96–112)
Creatinine, Ser: 1 mg/dL (ref 0.50–1.35)
Glucose, Bld: 125 mg/dL — ABNORMAL HIGH (ref 70–99)
Hemoglobin: 15.6 g/dL (ref 13.0–17.0)
Potassium: 3.7 mEq/L (ref 3.5–5.1)
Sodium: 140 mEq/L (ref 135–145)

## 2013-05-15 LAB — RAPID URINE DRUG SCREEN, HOSP PERFORMED
Cocaine: NOT DETECTED
Opiates: NOT DETECTED

## 2013-05-15 MED ORDER — LORAZEPAM 1 MG PO TABS
1.0000 mg | ORAL_TABLET | Freq: Three times a day (TID) | ORAL | Status: DC | PRN
  Administered 2013-05-15: 1 mg via ORAL
  Filled 2013-05-15: qty 1

## 2013-05-15 MED ORDER — NICOTINE 21 MG/24HR TD PT24: 21.0000 mg | MEDICATED_PATCH | Freq: Every day | TRANSDERMAL | Status: DC

## 2013-05-15 MED ORDER — IBUPROFEN 200 MG PO TABS: 600.0000 mg | ORAL_TABLET | Freq: Three times a day (TID) | ORAL | Status: DC | PRN

## 2013-05-15 MED ORDER — ONDANSETRON HCL 4 MG PO TABS: 4.0000 mg | ORAL_TABLET | Freq: Three times a day (TID) | ORAL | Status: DC | PRN
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
## 2013-05-15 NOTE — ED Notes (Signed)
Pt responding to internal stimuli.  This is noted with inappropriate laughter.  Pt guarded and forwards little concerning why he is here.
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
## 2013-05-16 ENCOUNTER — Encounter (HOSPITAL_COMMUNITY): Payer: Self-pay | Admitting: Registered Nurse

## 2013-05-16 DIAGNOSIS — F200 Paranoid schizophrenia: Secondary | ICD-10-CM | POA: Diagnosis not present

## 2013-05-16 NOTE — ED Notes (Addendum)
Patient asking for soft drinks, just completed 3 drinks, informed him he could have water no drink at this time, well I want to leave here, asking to see MD for discharge, MD informed. Can hear patient laughing while in his room from internal stimuli.

## 2013-05-16 NOTE — ED Provider Notes (Signed)
Medical screening examination/treatment/procedure(s) were performed by non-physician practitioner and as supervising physician I was immediately available for consultation/collaboration.   Sabria Florido E Charliegh Vasudevan, MD 05/16/13 1054 

## 2013-05-16 NOTE — ED Provider Notes (Signed)
3:36 PM The patient was seen and evaluated by the psychiatric team.  A rounded on the patient twice today.  He feels that is doing much better this evening and therefore they're recommending discharge home with outpatient referrals for psychiatric treatment.  They do not believe the patient is a threat to himself or to others at this time.  I did not personally evaluate this patient.  The patient was medically cleared when he was placed on the psychiatric unit of the emergency department.  He is in the care of the psychiatric team who believes she is safe for discharge.  Lyanne Co, MD 05/16/13 1537
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
## 2013-05-16 NOTE — Progress Notes (Signed)
Patient Discharge Instructions:  After Visit Summary (AVS):   Faxed to:  05/16/13 Discharge Summary Note:   Faxed to:  05/16/13 Psychiatric Admission Assessment Note:   Faxed to:  05/16/13 Suicide Risk Assessment - Discharge Assessment:   Faxed to:  05/16/13 Faxed/Sent to the Next Level Care provider:  05/16/13 Faxed to [facility] @ [phone]  Jerelene Redden, 05/16/2013, 3:07 PM

## 2013-05-19 ENCOUNTER — Emergency Department (HOSPITAL_COMMUNITY)
Admission: EM | Admit: 2013-05-19 | Discharge: 2013-05-19 | Disposition: A | Discharge status: Home / Self Care | Payer: Medicare Other | Attending: Emergency Medicine | Admitting: Emergency Medicine

## 2013-05-19 ENCOUNTER — Emergency Department (HOSPITAL_COMMUNITY): Payer: Medicare Other

## 2013-05-19 ENCOUNTER — Encounter (HOSPITAL_COMMUNITY): Payer: Self-pay | Admitting: Emergency Medicine

## 2013-05-19 DIAGNOSIS — S62339A Displaced fracture of neck of unspecified metacarpal bone, initial encounter for closed fracture: Secondary | ICD-10-CM

## 2013-05-19 DIAGNOSIS — F17200 Nicotine dependence, unspecified, uncomplicated: Secondary | ICD-10-CM | POA: Diagnosis not present

## 2013-05-19 DIAGNOSIS — S62233A Other displaced fracture of base of first metacarpal bone, unspecified hand, initial encounter for closed fracture: Secondary | ICD-10-CM | POA: Diagnosis not present

## 2013-05-19 DIAGNOSIS — S62319A Displaced fracture of base of unspecified metacarpal bone, initial encounter for closed fracture: Secondary | ICD-10-CM | POA: Diagnosis not present

## 2013-05-19 DIAGNOSIS — Z79899 Other long term (current) drug therapy: Secondary | ICD-10-CM | POA: Insufficient documentation

## 2013-05-19 DIAGNOSIS — W2209XA Striking against other stationary object, initial encounter: Secondary | ICD-10-CM | POA: Insufficient documentation

## 2013-05-19 DIAGNOSIS — F209 Schizophrenia, unspecified: Secondary | ICD-10-CM | POA: Diagnosis not present

## 2013-05-19 DIAGNOSIS — Y939 Activity, unspecified: Secondary | ICD-10-CM | POA: Insufficient documentation

## 2013-05-19 DIAGNOSIS — Y929 Unspecified place or not applicable: Secondary | ICD-10-CM | POA: Insufficient documentation

## 2013-05-19 MED ORDER — HYDROCODONE-ACETAMINOPHEN 5-325 MG PO TABS: 1.0000 | ORAL_TABLET | Freq: Four times a day (QID) | ORAL | Status: DC | PRN
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
## 2013-05-19 NOTE — ED Provider Notes (Signed)
Medical screening examination/treatment/procedure(s) were performed by non-physician practitioner and as supervising physician I was immediately available for consultation/collaboration.   Joya Gaskins, MD 05/19/13 1326
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
## 2013-05-25 ENCOUNTER — Emergency Department (HOSPITAL_COMMUNITY)
Admission: EM | Admit: 2013-05-25 | Discharge: 2013-05-28 | Disposition: A | Discharge status: Home / Self Care | Payer: Medicare Other | Attending: Emergency Medicine | Admitting: Emergency Medicine

## 2013-05-25 ENCOUNTER — Encounter (HOSPITAL_COMMUNITY): Payer: Self-pay | Admitting: Emergency Medicine

## 2013-05-25 DIAGNOSIS — Z8781 Personal history of (healed) traumatic fracture: Secondary | ICD-10-CM | POA: Insufficient documentation

## 2013-05-25 DIAGNOSIS — F209 Schizophrenia, unspecified: Secondary | ICD-10-CM | POA: Diagnosis not present

## 2013-05-25 DIAGNOSIS — F203 Undifferentiated schizophrenia: Secondary | ICD-10-CM

## 2013-05-25 DIAGNOSIS — F259 Schizoaffective disorder, unspecified: Secondary | ICD-10-CM

## 2013-05-25 DIAGNOSIS — R45850 Homicidal ideations: Secondary | ICD-10-CM | POA: Insufficient documentation

## 2013-05-25 DIAGNOSIS — F17200 Nicotine dependence, unspecified, uncomplicated: Secondary | ICD-10-CM | POA: Insufficient documentation

## 2013-05-25 DIAGNOSIS — R443 Hallucinations, unspecified: Secondary | ICD-10-CM | POA: Diagnosis not present

## 2013-05-25 DIAGNOSIS — R45851 Suicidal ideations: Secondary | ICD-10-CM | POA: Insufficient documentation

## 2013-05-25 DIAGNOSIS — M25449 Effusion, unspecified hand: Secondary | ICD-10-CM | POA: Insufficient documentation

## 2013-05-25 DIAGNOSIS — F1410 Cocaine abuse, uncomplicated: Secondary | ICD-10-CM | POA: Insufficient documentation

## 2013-05-25 LAB — COMPREHENSIVE METABOLIC PANEL
AST: 17 U/L (ref 0–37)
Albumin: 4.1 g/dL (ref 3.5–5.2)
Alkaline Phosphatase: 112 U/L (ref 39–117)
BUN: 5 mg/dL — ABNORMAL LOW (ref 6–23)
CO2: 29 mEq/L (ref 19–32)
Chloride: 98 mEq/L (ref 96–112)
Creatinine, Ser: 0.71 mg/dL (ref 0.50–1.35)
GFR calc non Af Amer: >90 mL/min (ref >90)
Potassium: 3.7 mEq/L (ref 3.5–5.1)
Total Bilirubin: 0.8 mg/dL (ref 0.3–1.2)

## 2013-05-25 LAB — CBC
HCT: 44.6 % (ref 39.0–52.0)
MCV: 89.4 fL (ref 78.0–100.0)
Platelets: 239 10*3/uL (ref 150–400)
RBC: 4.99 MIL/uL (ref 4.22–5.81)
RDW: 13.6 % (ref 11.5–15.5)
WBC: 9.1 10*3/uL (ref 4.0–10.5)

## 2013-05-25 LAB — SALICYLATE LEVEL: Salicylate Lvl: <2 mg/dL — ABNORMAL LOW (ref 2.8–20.0)

## 2013-05-25 LAB — ACETAMINOPHEN LEVEL: Acetaminophen (Tylenol), Serum: <15 ug/mL (ref 10–30)

## 2013-05-25 MED ORDER — IBUPROFEN 200 MG PO TABS: 600.0000 mg | ORAL_TABLET | Freq: Three times a day (TID) | ORAL | Status: DC | PRN

## 2013-05-25 MED ORDER — ZOLPIDEM TARTRATE 5 MG PO TABS
5.0000 mg | ORAL_TABLET | Freq: Every evening | ORAL | Status: DC | PRN
  Administered 2013-05-26: 5 mg via ORAL
  Filled 2013-05-25: qty 1

## 2013-05-25 MED ORDER — LORAZEPAM 1 MG PO TABS
1.0000 mg | ORAL_TABLET | Freq: Three times a day (TID) | ORAL | Status: DC | PRN
  Administered 2013-05-26 (×2): 1 mg via ORAL
  Filled 2013-05-25 (×2): qty 1

## 2013-05-25 MED ORDER — NICOTINE 21 MG/24HR TD PT24
21.0000 mg | MEDICATED_PATCH | Freq: Every day | TRANSDERMAL | Status: DC
  Filled 2013-05-25: qty 1

## 2013-05-25 MED ORDER — ONDANSETRON HCL 4 MG PO TABS: 4.0000 mg | ORAL_TABLET | Freq: Three times a day (TID) | ORAL | Status: DC | PRN

## 2013-05-25 NOTE — ED Provider Notes (Signed)
Medical screening examination/treatment/procedure(s) were performed by non-physician practitioner and as supervising physician I was immediately available for consultation/collaboration.   Dagmar Hait, MD 05/25/13 [phone]

## 2013-05-25 NOTE — ED Notes (Signed)
Patient clothes was just brought back here by [employer] emt
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
## 2013-05-25 NOTE — ED Notes (Signed)
One bag of patient belongings sent to Psych ED and given to Psych ED staff.  
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
## 2013-05-26 DIAGNOSIS — F29 Unspecified psychosis not due to a substance or known physiological condition: Secondary | ICD-10-CM

## 2013-05-26 DIAGNOSIS — F1910 Other psychoactive substance abuse, uncomplicated: Secondary | ICD-10-CM

## 2013-05-26 LAB — URINALYSIS, ROUTINE W REFLEX MICROSCOPIC
Bilirubin Urine: NEGATIVE
Glucose, UA: NEGATIVE mg/dL
Hgb urine dipstick: NEGATIVE
Protein, ur: NEGATIVE mg/dL
Specific Gravity, Urine: 1.015 (ref 1.005–1.030)

## 2013-05-26 LAB — RAPID URINE DRUG SCREEN, HOSP PERFORMED
Amphetamines: NOT DETECTED
Barbiturates: NOT DETECTED
Benzodiazepines: NOT DETECTED

## 2013-05-26 MED ORDER — DIVALPROEX SODIUM ER 500 MG PO TB24
500.0000 mg | ORAL_TABLET | Freq: Every day | ORAL | Status: DC
  Administered 2013-05-26 – 2013-05-28 (×3): 500 mg via ORAL
  Filled 2013-05-26 (×3): qty 1

## 2013-05-26 MED ORDER — DIVALPROEX SODIUM ER 500 MG PO TB24
1000.0000 mg | ORAL_TABLET | Freq: Every day | ORAL | Status: DC
  Administered 2013-05-26 – 2013-05-27 (×2): 1000 mg via ORAL
  Filled 2013-05-26 (×3): qty 2

## 2013-05-26 MED ORDER — OLANZAPINE 5 MG PO TABS
15.0000 mg | ORAL_TABLET | Freq: Every day | ORAL | Status: DC
  Administered 2013-05-26 – 2013-05-27 (×2): 15 mg via ORAL
  Filled 2013-05-26 (×2): qty 3

## 2013-05-26 NOTE — BH Assessment (Addendum)
[facility] Assessment Progress Note  Update @ 1058:  Received call from Susie @ [facility] stating no beds available @ 1022.  Received call from Kathy requesting an EKG.  This relayed to pt's nurse.  However, received another call from Kathy @ 1040 stating pt declined by Dr. Brenda Harris due to acuity. No beds at [facility] per Nikki @ 1127.  Called [facility] again @ 1157, no answer.  Referral faxed.  Called [facility] and beds available per Barbara @ 1158.  Referral faxed for review.  Called [facility] again and per Kim @ 1159, no beds available.  Also, received call from HH and no beds available @ 1200.    Update: Called following facilities for placement consideration:  - [facility] - no answer @ 0820  - [facility] - no answer @ 0826  - [facility] - Left message @ 0827  - [facility] - No beds per Danny @ 0827  - [facility] - 0830 - Beds per Carlos - Referral faxed for review  - [facility] - May have beds after discharges and fax for review per Jessica @ 0831 - Referral faxed for review  - [facility] - Beds per Kathy @ 0832 - Referral faxed for review  - [facility] - May have beds per Susie and fax for review @ 0850 - Referral faxed for review  - [facility] - Left message @ 0851  TTS staff will continue to attempt to find placement for pt.        

## 2013-05-26 NOTE — ED Notes (Signed)
Up to the bathroom 

## 2013-05-26 NOTE — ED Notes (Signed)
Pt transferred from triage, presents with complaint of hearing voices, SI, HI.  Pt states he is hungry, not forthcoming with information.  Pt anxious, agitated, irritated.

## 2013-05-26 NOTE — BH Assessment (Signed)
[facility] Assessment Progress Note Update:  [facility] referral initiated, as TTS staff has been unable to place pt elsewhere.  Completed [facility] referral form.  [place] [phone]), spoke with Corrie Dandy, who gave authorization number [id number] for 7 days (8/30-06/01/13).  Called [facility], spoke with [facility] @ 1426 and completed phone referral.  [facility] referral faxed to [facility].  TTS to follow up with referral status.

## 2013-05-26 NOTE — ED Notes (Signed)
Dr Pietro Cassis and josephine NP into see

## 2013-05-26 NOTE — ED Notes (Signed)
Per TTS- [facility] requests a Depakote level, UA and UDS
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
## 2013-05-26 NOTE — ED Notes (Signed)
Snack given, watching tv

## 2013-05-26 NOTE — ED Notes (Signed)
Pt voided w/o collecting urine

## 2013-05-26 NOTE — Consult Note (Signed)
[facility] Face-to-Face Psychiatry Consult   Reason for Consult:  Suicidal ideation Referring Physician:  EDP AMROM ORE is an 37 y.o. male.  Assessment: AXIS I:  Psychotic Disorder NOS and Substance Abuse AXIS II:  Deferred AXIS III:   Past Medical History  Diagnosis Date  . Schizophrenic disorder    AXIS IV:  housing problems, occupational problems, other psychosocial or environmental problems, problems related to social environment, problems with access to health care services and problems with primary support group AXIS V:  51-60 moderate symptoms  Plan:  Recommend psychiatric Inpatient admission when medically cleared.  Subjective:   HARL WIECHMANN is a 37 y.o. male patient admitted with seen for Chronic Paranoid Schizophrenia.  HPI:  LEMOYNE NESTOR is a 37 y.o. male who presents voluntarily to [facility] with SI/HIAH. Pt recently d/c'd from [facility] approx 1 week and has had numerous inpt admissions with [facility] starting in 2003. Pt is homeless and states he is living in the woods. Pt says he has been hearing voices for approx 2 wks and has been SI/HI for 2 months, denies any current plan(s) to harm self or others.  Pt " and  "I'm confused and need something for my nerves". Pt has cast on right hand/arm after punching a wall 1 week ago, pt was treated at [facility] on 05/19/13. During this interview, pt has pressured speech, poor eye contact and was unable to state what medications he take and where they are.  Patient later informed this Clinical research associate "people stole my medicines, give me something for my nerves". Pt denies using any substances but has past hx of cocaine use but no UDS was obtained at this time.  His alcohol level was <11.  We will seek placement at any other facility since we do not have any ready bed.  We will also provide him with his needed medications.   HPI Elements:   Location:  [facility]. Quality:  SEVERE, ANGRY, SUICIDE. Severity:  SEVERE. Timing:  CHRONIC PARANOID . Duration:  Unknown. Context:    None.  Past Psychiatric History: Past Medical History  Diagnosis Date  . Schizophrenic disorder     reports that he has been smoking Cigarettes.  He has a 15 pack-year smoking history. He has never used smokeless tobacco. He reports that he uses illicit drugs (Cocaine). He reports that he does not drink alcohol. No family history on file. Family History Substance Abuse: No Family Supports: No Living Arrangements: Other (Comment) (Homeless ) Can pt return to current living arrangement?: Yes Abuse/Neglect [facility]) Physical Abuse: Denies Verbal Abuse: Denies Sexual Abuse: Denies Allergies:   Allergies  Allergen Reactions  . Haldol [Haloperidol Decanoate] Other (See Comments)    'I curl up"    ACT Assessment Complete:  Yes:    Educational Status    Risk to Self: Risk to self Suicidal Ideation: Yes-Currently Present Suicidal Intent: No-Not Currently/Within Last 6 Months Is patient at risk for suicide?: No Suicidal Plan?: No-Not Currently/Within Last 6 Months Specify Current Suicidal Plan: No current plan  Access to Means: No Specify Access to Suicidal Means: None  What has been your use of drugs/alcohol within the last 12 months?: Pt denies SA, however past hx of Cocaine use  Previous Attempts/Gestures: No How many times?: 0 Other Self Harm Risks: None  Triggers for Past Attempts: None known;Unpredictable Intentional Self Injurious Behavior: None Family Suicide History: No Recent stressful life event(s): Financial Problems;Other (Comment);Conflict (Comment) (Issues w/family members; Homeless ) Persecutory voices/beliefs?: No Depression: No  Depression Symptoms:  (None reported ) Substance abuse history and/or treatment for substance abuse?: Yes Suicide prevention information given to non-admitted patients: Not applicable  Risk to Others: Risk to Others Homicidal Ideation: Yes-Currently Present Thoughts of Harm to Others: Yes-Currently Present Comment - Thoughts of Harm to  Others: Pt reports if he doesn't get monies owed to him by his family he will commit murder  Current Homicidal Intent: No-Not Currently/Within Last 6 Months Current Homicidal Plan: No-Not Currently/Within Last 6 Months Describe Current Homicidal Plan: Pt did not disclose plan to this Clinical research associate  Access to Homicidal Means: No Identified Victim: None  History of harm to others?: No Assessment of Violence: None Noted Violent Behavior Description: None  Does patient have access to weapons?: No Criminal Charges Pending?: No Does patient have a court date: No  Abuse: Abuse/Neglect Assessment (Assessment to be complete while patient is alone) Physical Abuse: Denies Verbal Abuse: Denies Sexual Abuse: Denies Exploitation of patient/patient's resources: Denies Self-Neglect: Denies  Prior Inpatient Therapy: Prior Inpatient Therapy Prior Inpatient Therapy: Yes Prior Therapy Dates: 2014,2012,2011,2009,2008,2007,2006,2005,2004,2003 Prior Therapy Facilty/Provider(s): [facility] Reason for Treatment: Schizophrenia, SI/HI/SA  Prior Outpatient Therapy: Prior Outpatient Therapy Prior Outpatient Therapy: Yes Prior Therapy Dates: Current Prior Therapy Facilty/Provider(s): [facility]  Reason for Treatment: Med Mgt  Additional Information: Additional Information 1:1 In Past 12 Months?: No CIRT Risk: No Elopement Risk: No Does patient have medical clearance?: Yes                  Objective: Blood pressure 116/76, pulse 89, temperature 97.6 F (36.4 C), temperature source Oral, resp. rate 20, SpO2 95.00%.There is no weight on file to calculate BMI. Results for orders placed during the hospital encounter of 05/25/13 (from the past 72 hour(s))  ACETAMINOPHEN LEVEL     Status: None   Collection Time    05/25/13  9:58 PM      Result Value Range   Acetaminophen (Tylenol), Serum <15.0  10 - 30 ug/mL   Comment:            THERAPEUTIC CONCENTRATIONS VARY     SIGNIFICANTLY. A RANGE OF 10-30      ug/mL MAY BE AN EFFECTIVE     CONCENTRATION FOR MANY PATIENTS.     HOWEVER, SOME ARE BEST TREATED     AT CONCENTRATIONS OUTSIDE THIS     RANGE.     ACETAMINOPHEN CONCENTRATIONS     >150 ug/mL AT 4 HOURS AFTER     INGESTION AND >50 ug/mL AT 12     HOURS AFTER INGESTION ARE     OFTEN ASSOCIATED WITH TOXIC     REACTIONS.  CBC     Status: Abnormal   Collection Time    05/25/13  9:58 PM      Result Value Range   WBC 9.1  4.0 - 10.5 K/uL   RBC 4.99  4.22 - 5.81 MIL/uL   Hemoglobin 16.2  13.0 - 17.0 g/dL   HCT 45.4  09.8 - 11.9 %   MCV 89.4  78.0 - 100.0 fL   MCH 32.5  26.0 - 34.0 pg   MCHC 36.3 (*) 30.0 - 36.0 g/dL   RDW 14.7  82.9 - 56.2 %   Platelets 239  150 - 400 K/uL  COMPREHENSIVE METABOLIC PANEL     Status: Abnormal   Collection Time    05/25/13  9:58 PM      Result Value Range   Sodium 135  135 - 145 mEq/L  Potassium 3.7  3.5 - 5.1 mEq/L   Chloride 98  96 - 112 mEq/L   CO2 29  19 - 32 mEq/L   Glucose, Bld 88  70 - 99 mg/dL   BUN 5 (*) 6 - 23 mg/dL   Creatinine, Ser 8.65  0.50 - 1.35 mg/dL   Calcium 9.6  8.4 - 78.4 mg/dL   Total Protein 7.5  6.0 - 8.3 g/dL   Albumin 4.1  3.5 - 5.2 g/dL   AST 17  0 - 37 U/L   ALT 19  0 - 53 U/L   Alkaline Phosphatase 112  39 - 117 U/L   Total Bilirubin 0.8  0.3 - 1.2 mg/dL   GFR calc non Af Amer >90  >90 mL/min   GFR calc Af Amer >90  >90 mL/min   Comment: (NOTE)     The eGFR has been calculated using the CKD EPI equation.     This calculation has not been validated in all clinical situations.     eGFR's persistently <90 mL/min signify possible Chronic Kidney     Disease.  ETHANOL     Status: None   Collection Time    05/25/13  9:58 PM      Result Value Range   Alcohol, Ethyl (B) <11  0 - 11 mg/dL   Comment:            LOWEST DETECTABLE LIMIT FOR     SERUM ALCOHOL IS 11 mg/dL     FOR MEDICAL PURPOSES ONLY  SALICYLATE LEVEL     Status: Abnormal   Collection Time    05/25/13  9:58 PM      Result Value Range   Salicylate  Lvl <2.0 (*) 2.8 - 20.0 mg/dL   Labs are reviewed and are pertinent for Normal labs  Current Facility-Administered Medications  Medication Dose Route Frequency Provider Last Rate Last Dose  . ibuprofen (ADVIL,MOTRIN) tablet 600 mg  600 mg Oral Q8H PRN Arman Filter, NP      . LORazepam (ATIVAN) tablet 1 mg  1 mg Oral Q8H PRN Arman Filter, NP   1 mg at 05/26/13 0047  . nicotine (NICODERM CQ - dosed in mg/24 hours) patch 21 mg  21 mg Transdermal Daily Arman Filter, NP      . ondansetron [facility]) tablet 4 mg  4 mg Oral Q8H PRN Arman Filter, NP      . zolpidem (AMBIEN) tablet 5 mg  5 mg Oral QHS PRN Arman Filter, NP   5 mg at 05/26/13 6962   Current Outpatient Prescriptions  Medication Sig Dispense Refill  . HYDROcodone-acetaminophen (NORCO/VICODIN) 5-325 MG per tablet Take 1-2 tablets by mouth every 6 (six) hours as needed for pain.  15 tablet  0    Psychiatric Specialty Exam:     Blood pressure 116/76, pulse 89, temperature 97.6 F (36.4 C), temperature source Oral, resp. rate 20, SpO2 95.00%.There is no weight on file to calculate BMI.  General Appearance: Disheveled  Eye Contact::  Poor  Speech:  Pressured  Volume:  Decreased  Mood:  Angry, Anxious, Depressed and Irritable  Affect:  Appropriate, Blunt and Congruent  Thought Process:  Linear and Loose  Orientation:  Full (Time, Place, and Person)  Thought Content:  NA  Suicidal Thoughts:  Yes.  without intent/plan  Homicidal Thoughts:  Yes.  without intent/plan  Memory:  Immediate;   Fair Recent;   Fair Remote;   Fair  Judgement:  Poor  Insight:  Lacking and Shallow  Psychomotor Activity:  Normal  Concentration:  Fair  Recall:  Fair  Akathisia:  NA  Handed:  Right  AIMS (if indicated):     Assets:  Desire for Improvement Housing  Sleep:      Treatment Plan Summary: Consult and face to face interview with Dr Orpha Bur We will resume his discharge medications We will keep overnight here for safety and stability while  we seek placement Next provider to reevaluate tomorrow. Daily contact with patient to assess and evaluate symptoms and progress in treatment  Dahlia Byes, C 05/26/2013 11:27 AM

## 2013-05-26 NOTE — ED Notes (Signed)
eatting breakfast, pt reports he is feeling better, denies avh at this time.

## 2013-05-26 NOTE — BH Assessment (Signed)
Assessment Note  [name] is a 37 y.o. male who presents voluntarily to [facility] with SI/HIAH.  Pt recently d/c'd from [facility] approx 1 week and has had numerous inpt admissions with [facility] starting in 2003.  Pt is homeless and states he is living in the woods. Pt says he has been hearing voices for approx 2 wks and has been SI/HI for 2 months, denies any current plan(s) to harm self or others. Pt tells this Clinical research associate that voices say--"get a job", "get money from my fucking family", says if he doesn't get money from his family he's going to commit murder.  Pt says he tired of people "getting on my fucking nerves" and says "I'm confused and need something for my nerves". Pt has cast on right hand/arm after punching a wall 1 week ago, pt was treated at [facility] on 05/19/13.  During interview, pt has pressured speech, abusive/profane language with this writer, excessively using the word fuck(ing).  Pt denies using any substances but has past hx of cocaine use, pt has not provided urine sample for UDS.  Pt has been accepted to [facility] by Maryjean Morn, PA, pending 400 hall bed.     Axis I: Chronic Paranoid Schizophrenia and Substance Abuse Axis II: Deferred Axis III:  Past Medical History  Diagnosis Date  . Schizophrenic disorder    Axis IV: economic problems, housing problems, other psychosocial or environmental problems, problems related to social environment and problems with primary support group Axis V: 21-30 behavior considerably influenced by delusions or hallucinations OR serious impairment in judgment, communication OR inability to function in almost all areas  Past Medical History:  Past Medical History  Diagnosis Date  . Schizophrenic disorder     History reviewed. No pertinent past surgical history.  Family History: No family history on file.  Social History:  reports that he has been smoking Cigarettes.  He has a 15 pack-year smoking history. He has never used smokeless tobacco. He reports that he  uses illicit drugs (Cocaine). He reports that he does not drink alcohol.  Additional Social History:  Alcohol / Drug Use Pain Medications: Norco  Prescriptions: Unk  Over the Counter: None  History of alcohol / drug use?: Yes Longest period of sobriety (when/how long): Pt denies any drug use to this Clinical research associate, however pt has hx of SA Negative Consequences of Use: Personal relationships;Legal;Work / School;Financial  CIWA: CIWA-Ar BP: 128/82 mmHg Pulse Rate: 103 COWS:    Allergies:  Allergies  Allergen Reactions  . Haldol [Haloperidol Decanoate] Other (See Comments)    'I curl up"    Home Medications:  (Not in a hospital admission)  OB/GYN Status:  No LMP for male patient.  General Assessment Data [facility] of Assessment: [facility] Is this a Tele or Face-to-Face Assessment?: Face-to-Face Is this an Initial Assessment or a Re-assessment for this encounter?: Initial Assessment Living Arrangements: Other (Comment) (Homeless ) Can pt return to current living arrangement?: Yes Admission Status: Voluntary Is patient capable of signing voluntary admission?: Yes Transfer from: Acute Hospital Referral Source: MD  Medical Screening Exam [facility] Walk-in ONLY) Medical Exam completed: No Reason for MSE not completed: Other: (None )  [facility] Crisis Care Plan Living Arrangements: Other (Comment) (Homeless ) Name of Psychiatrist: Vesta Mixer  Name of Therapist: [facility]   Education Status Is patient currently in school?: No Current Grade: None  Highest grade of school patient has completed: None  Name of school: None  Contact person: None   Risk to self  Suicidal Ideation: Yes-Currently Present Suicidal Intent: No-Not Currently/Within Last 6 Months Is patient at risk for suicide?: No Suicidal Plan?: No-Not Currently/Within Last 6 Months Specify Current Suicidal Plan: No current plan  Access to Means: No Specify Access to Suicidal Means: None  What has been your use of drugs/alcohol within the  last 12 months?: Pt denies SA, however past hx of Cocaine use  Previous Attempts/Gestures: No How many times?: 0 Other Self Harm Risks: None  Triggers for Past Attempts: None known;Unpredictable Intentional Self Injurious Behavior: None Family Suicide History: No Recent stressful life event(s): Financial Problems;Other (Comment);Conflict (Comment) (Issues w/family members; Homeless ) Persecutory voices/beliefs?: No Depression: No Depression Symptoms:  (None reported ) Substance abuse history and/or treatment for substance abuse?: Yes Suicide prevention information given to non-admitted patients: Not applicable  Risk to Others Homicidal Ideation: Yes-Currently Present Thoughts of Harm to Others: Yes-Currently Present Comment - Thoughts of Harm to Others: Pt reports if he doesn't get monies owed to him by his family he will commit murder  Current Homicidal Intent: No-Not Currently/Within Last 6 Months Current Homicidal Plan: No-Not Currently/Within Last 6 Months Describe Current Homicidal Plan: Pt did not disclose plan to this Clinical research associate  Access to Homicidal Means: No Identified Victim: None  History of harm to others?: No Assessment of Violence: None Noted Violent Behavior Description: None  Does patient have access to weapons?: No Criminal Charges Pending?: No Does patient have a court date: No  Psychosis Hallucinations: Auditory;With command Delusions: None noted  Mental Status Report Appear/Hygiene: Disheveled;Body odor;Poor hygiene Eye Contact: Good Motor Activity: Unremarkable Speech: Logical/coherent;Loud;Pressured;Abusive Level of Consciousness: Alert Mood: Angry;Labile Affect: Angry;Irritable;Labile Anxiety Level: None Thought Processes: Relevant Judgement: Impaired Orientation: Person;Place;Time;Situation Obsessive Compulsive Thoughts/Behaviors: None  Cognitive Functioning Concentration: Normal Memory: Recent Intact;Remote Intact IQ: Average Insight:  Poor Impulse Control: Poor Appetite: Good Weight Loss: 0 Weight Gain: 0 Sleep: No Change Total Hours of Sleep: 6 Vegetative Symptoms: None  ADLScreening [facility]) Patient's cognitive ability adequate to safely complete daily activities?: Yes Patient able to express need for assistance with ADLs?: Yes Independently performs ADLs?: Yes (appropriate for developmental age)  Prior Inpatient Therapy Prior Inpatient Therapy: Yes Prior Therapy Dates: 2014,2012,2011,2009,2008,2007,2006,2005,2004,2003 Prior Therapy Facilty/Provider(s): [facility] Reason for Treatment: Schizophrenia, SI/HI/SA  Prior Outpatient Therapy Prior Outpatient Therapy: Yes Prior Therapy Dates: Current Prior Therapy Facilty/Provider(s): [facility]  Reason for Treatment: Med Mgt  ADL Screening (condition at time of admission) Patient's cognitive ability adequate to safely complete daily activities?: Yes Is the patient deaf or have difficulty hearing?: No Does the patient have difficulty seeing, even when wearing glasses/contacts?: No Does the patient have difficulty concentrating, remembering, or making decisions?: No Patient able to express need for assistance with ADLs?: Yes Does the patient have difficulty dressing or bathing?: No Independently performs ADLs?: Yes (appropriate for developmental age) Does the patient have difficulty walking or climbing stairs?: No Weakness of Legs: None Weakness of Arms/Hands: Right (Pt has cast on right arm/hand, punched a wall )  Home Assistive Devices/Equipment Home Assistive Devices/Equipment: None  Therapy Consults (therapy consults require a physician order) PT Evaluation Needed: No OT Evalulation Needed: No SLP Evaluation Needed: No Abuse/Neglect Assessment (Assessment to be complete while patient is alone) Physical Abuse: Denies Verbal Abuse: Denies Sexual Abuse: Denies Exploitation of patient/patient's resources: Denies Self-Neglect: Denies Values /  Beliefs Cultural Requests During Hospitalization: None Spiritual Requests During Hospitalization: None Consults Spiritual Care Consult Needed: No Social Work Consult Needed: No Merchant navy officer (For Healthcare) Advance Directive: Patient does  not have advance directive;Patient would not like information Pre-existing out of facility DNR order (yellow form or pink MOST form): No Nutrition Screen- [facility] Adult/[facility] Patient's home diet: Regular  Additional Information 1:1 In Past 12 Months?: No CIRT Risk: No Elopement Risk: No Does patient have medical clearance?: Yes     Disposition:  Disposition Initial Assessment Completed for this Encounter: Yes Disposition of Patient: Inpatient treatment program;Referred to [facility] ) Type of inpatient treatment program: Adult Other disposition(s): Other (Comment) (Referred to [facility] ) Patient referred to: Other (Comment) (Referred to [facility] )  On Site Evaluation by:   Reviewed with Physician:    Beatrix Shipper C 05/26/2013 1:02 AM

## 2013-05-26 NOTE — ED Notes (Signed)
Pt cooperative with care, but does occasionally burst our into inappropriate laughter, scaring others on unit. Pt denies SI/HI.

## 2013-05-26 NOTE — ED Notes (Signed)
Up tot he bathroom to shower and changes scrubs 

## 2013-05-27 ENCOUNTER — Encounter (HOSPITAL_COMMUNITY): Payer: Self-pay | Admitting: Registered Nurse

## 2013-05-27 DIAGNOSIS — F259 Schizoaffective disorder, unspecified: Secondary | ICD-10-CM | POA: Diagnosis not present

## 2013-05-27 NOTE — BH Assessment (Signed)
Faxed UDS, urinalysis and x-ray information to [facility] admissions [phone].  Harlin Rain Ria Comment, [facility] Triage Specialist

## 2013-05-27 NOTE — BH Assessment (Signed)
Pt has been accepted to [facility] wait list per Vivien Rossetti at [facility].  Harlin Rain Ria Comment, [facility] Triage Specialist

## 2013-05-27 NOTE — [facility] Counselor (Signed)
Following placements have no beds per [facility] attempts this AM:  -[facility] - no beds per Lynn @ 0959  -[facility] - no beds per Evelyn @ 0959  -[facility] - no beds per Susie @ 1000  -[facility] - no beds per Lisa @ 1001  -[facility] has no beds as of now   - [facility] - only male beds per Kim @ 1005  - [facility] - beds @ 1049 - Referral faxed for review  - [facility] - no beds @ 1007  - [place] - no beds per Katie @ 1003  - [facility] - No beds per Brian @ 1008  - [facility] - Left message @ 1012  - [facility] - No beds per Beth @ 1034  - [facility] - no beds per Kathy @ 1037  - [facility] - 1057 - no beds per Amber   

## 2013-05-27 NOTE — ED Notes (Signed)
NP in w/ pt 

## 2013-05-27 NOTE — Consult Note (Signed)
Follow up  Consult progress note: Face to face interview and consult with Dr. Tobie Poet T Staley05/09/[id number]  Subjective:  Patient states that he was here only because he needed some food and is ready to go.  Patient states that he is feeling fine; denies suicidal ideation, homicidal ideation, psychosis, and paranoia.  Patient states that he is in the process of changing is outpatient provider to [facility].  States that he is currently with [facility] but and [facility] is his payee but it is hard getting in touch with anyone since in process of the practice moving to a different location.  States that he was at [facility] last week and is being set up with ACT team also.  Patient states that he has being compliant with his medications.   "I am better.  I done quite doing drugs and everything."  Patient states that he wast to go.   Patient speech somewhat loud.  Patient doesn't appear to be responding to internal stimuli.  Patient able to discuss his plans for treatment and steps he has taken. Patient able to contract for safety.   Discussed with patient about coming to the ED weekly for sleep or food.  Explained that the ED was to be used for emergent reasons or if he felt that he was going to harm himself or someone else.  Patient voiced his understanding.    Current Medication Current facility-administered medications:divalproex (DEPAKOTE ER) 24 hr tablet 1,000 mg, 1,000 mg, Oral, QHS, Earney Navy, NP, 1,000 mg at 05/26/13 2159;  divalproex (DEPAKOTE ER) 24 hr tablet 500 mg, 500 mg, Oral, Daily, Earney Navy, NP, 500 mg at 05/27/13 1610;  ibuprofen (ADVIL,MOTRIN) tablet 600 mg, 600 mg, Oral, Q8H PRN, Arman Filter, NP LORazepam (ATIVAN) tablet 1 mg, 1 mg, Oral, Q8H PRN, Arman Filter, NP, 1 mg at 05/26/13 2159;  nicotine (NICODERM CQ - dosed in mg/24 hours) patch 21 mg, 21 mg, Transdermal, Daily, Arman Filter, NP;  OLANZapine (ZYPREXA) tablet 15 mg, 15 mg, Oral, QHS, Earney Navy, NP, 15 mg at 05/26/13 2159;  ondansetron (ZOFRAN) tablet 4 mg, 4 mg, Oral, Q8H PRN, Arman Filter, NP zolpidem (AMBIEN) tablet 5 mg, 5 mg, Oral, QHS PRN, Arman Filter, NP, 5 mg at 05/26/13 0047 Current outpatient prescriptions:HYDROcodone-acetaminophen (NORCO/VICODIN) 5-325 MG per tablet, Take 1-2 tablets by mouth every 6 (six) hours as needed for pain., Disp: 15 tablet, Rfl: 0  Assessment @ Axis I: Schizoaffective Disorder Axis II: Deferred Axis III:  Past Medical History  Diagnosis Date  . Schizophrenic disorder    Axis IV: housing problems, other psychosocial or environmental problems, problems related to social environment and problems with primary support group Axis V: 51-60 moderate symptoms    Plan  Recommendation/Disposition:  Discharge home with outpatient resources.  Patient to follow up with [facility] Tuesday for medication management, ACT team, and therapy.    Solomiya Pascale, FNP-BC

## 2013-05-28 DIAGNOSIS — F259 Schizoaffective disorder, unspecified: Secondary | ICD-10-CM | POA: Diagnosis not present

## 2013-05-28 NOTE — BH Assessment (Signed)
Per Shuvon Rankin, FNP-BC on 05/27/13: Discharge home with outpatient resources. Patient to follow up with [facility] Tuesday for medication management, ACT team, and therapy. Suicide Risk Assessment to be performed by psychiatrist in the morning.  Contacted Jeffrey at [facility] who confirmed Pt is still on the wait list for a bed.  Harlin Rain Ria Comment, [facility] Triage Specialist

## 2013-05-28 NOTE — ED Notes (Signed)
NP and MD in to see patient.

## 2013-05-28 NOTE — BH Assessment (Signed)
Per Fayette Pho NP, pt can to be d/c as he doesn't meet inpatient criteria. Writer gave pt info re [facility] as pt states he wants to continue care with that agency. EDP and pt's RN notified.  [name], [place] Assessment Counselor

## 2013-05-28 NOTE — Consult Note (Signed)
Saw patient with Dr Lolly Mustache on rounds.  Patient is awake, alert and happy this am.  Patient denies SI/HI/AVH and is requesting to be discharged home.  He reports his medications are working and that he has an appointment with [facility] this morning.  He reports his mood is "ok" and his affect is congruent with his mood.  He denies hearing voices and states" I do not want to kill myself, I love life"  He want to go to [facility] and also see his doctor for the arm cast.  We will discharge him to follow up with his Psychiatrist at [facility]. Dahlia Byes  PMHNP-BC  I have personally seen the patient and agreed with the findings and involved in the treatment plan.  Patient is not suicidal and wants to continue his outpatient treatment at [facility].  He is aware about walking clinic .  He admitted not taking Zyprexa for a few days prior to coming emergency room.  Since he is taking Zyprexa and Depakote he is doing much better.  He denies any suicidal thoughts, hallucination, homicidal thought.  He has any tremors shakes or any side effects.  He would be followup at [facility].   Kathryne Sharper, MD

## 2013-05-29 NOTE — ED Provider Notes (Signed)
CSN: [id number]     Arrival date & time 05/25/13  2137 History   First MD Initiated Contact with Patient 05/25/13 2222     Chief Complaint  Patient presents with  . Medical Clearance   (Consider location/radiation/quality/duration/timing/severity/associated sxs/prior Treatment) HPI  Past Medical History  Diagnosis Date  . Schizophrenic disorder    History reviewed. No pertinent past surgical history. History reviewed. No pertinent family history. History  Substance Use Topics  . Smoking status: Current Every Day Smoker -- 1.00 packs/day for 15 years    Types: Cigarettes  . Smokeless tobacco: Never Used  . Alcohol Use: No    Review of Systems  Allergies  Haldol  Home Medications   Current Outpatient Rx  Name  Route  Sig  Dispense  Refill  . HYDROcodone-acetaminophen (NORCO/VICODIN) 5-325 MG per tablet   Oral   Take 1-2 tablets by mouth every 6 (six) hours as needed for pain.   15 tablet   0    BP 143/89  Pulse 96  Temp(Src) 97.5 F (36.4 C) (Axillary)  Resp 16  SpO2 99% Physical Exam  ED Course  Procedures (including critical care time) Labs Review Labs Reviewed  CBC - Abnormal; Notable for the following:    MCHC 36.3 (*)    All other components within normal limits  COMPREHENSIVE METABOLIC PANEL - Abnormal; Notable for the following:    BUN 5 (*)    All other components within normal limits  SALICYLATE LEVEL - Abnormal; Notable for the following:    Salicylate Lvl <2.0 (*)    All other components within normal limits  VALPROIC ACID LEVEL - Abnormal; Notable for the following:    Valproic Acid Lvl <10.0 (*)    All other components within normal limits  ACETAMINOPHEN LEVEL  ETHANOL  URINE RAPID DRUG SCREEN (HOSP PERFORMED)  URINALYSIS, ROUTINE W REFLEX MICROSCOPIC   Imaging Review No results found.  MDM   1. Schizoaffective disorder   2. Cocaine abuse   3. Schizophrenia, undifferentiated        Arman Filter, NP 05/29/13 2001  Arman Filter, NP 05/29/13 2002  Arman Filter, NP 05/29/13 2003  Arman Filter, NP 05/29/13 2003

## 2013-05-30 ENCOUNTER — Encounter (HOSPITAL_COMMUNITY): Payer: Self-pay

## 2013-05-30 ENCOUNTER — Emergency Department (HOSPITAL_COMMUNITY)
Admission: EM | Admit: 2013-05-30 | Discharge: 2013-05-30 | Disposition: A | Discharge status: Home / Self Care | Payer: Medicare Other | Attending: Emergency Medicine | Admitting: Emergency Medicine

## 2013-05-30 DIAGNOSIS — M79609 Pain in unspecified limb: Secondary | ICD-10-CM | POA: Insufficient documentation

## 2013-05-30 DIAGNOSIS — Z8781 Personal history of (healed) traumatic fracture: Secondary | ICD-10-CM | POA: Insufficient documentation

## 2013-05-30 DIAGNOSIS — G8911 Acute pain due to trauma: Secondary | ICD-10-CM | POA: Diagnosis not present

## 2013-05-30 DIAGNOSIS — F17200 Nicotine dependence, unspecified, uncomplicated: Secondary | ICD-10-CM | POA: Diagnosis not present

## 2013-05-30 DIAGNOSIS — S62306S Unspecified fracture of fifth metacarpal bone, right hand, sequela: Secondary | ICD-10-CM

## 2013-05-30 DIAGNOSIS — R6889 Other general symptoms and signs: Secondary | ICD-10-CM | POA: Diagnosis not present

## 2013-05-30 DIAGNOSIS — Z8659 Personal history of other mental and behavioral disorders: Secondary | ICD-10-CM | POA: Insufficient documentation

## 2013-05-30 DIAGNOSIS — R209 Unspecified disturbances of skin sensation: Secondary | ICD-10-CM | POA: Diagnosis not present

## 2013-05-30 MED ORDER — HYDROCODONE-ACETAMINOPHEN 5-325 MG PO TABS
1.0000 | ORAL_TABLET | Freq: Once | ORAL | Status: AC
  Administered 2013-05-30: 1 via ORAL
  Filled 2013-05-30: qty 1

## 2013-05-30 NOTE — ED Provider Notes (Signed)
Medical screening examination/treatment/procedure(s) were performed by non-physician practitioner and as supervising physician I was immediately available for consultation/collaboration.  Theadora Noyes T Elliyah Liszewski, MD 05/30/13 2312 

## 2013-05-30 NOTE — ED Provider Notes (Signed)
CSN: [id number]     Arrival date & time 05/30/13  1607 History  This chart was scribed for Trixie Dredge, [place], working with Toy Baker, MD by Blanchard Kelch, ED Scribe. This patient was seen in room WTR5/WTR5 and the patient's care was started at 5:09 PM.    Chief Complaint  Patient presents with  . finger tingling     The history is provided by the patient. No language interpreter was used.    HPI Comments: [name] is a 37 y.o. male with a history of schizophrenia and known fifth metacarpal fracture who presents to the Emergency Department complaining of intermittent pain in right pinky for about 7 days. States the middle of his hand feels red and hot. Patient broke his hand three weeks ago. Denies any new injury to his hand He was seen on 8/29 (5 days ago) for his hand. Patient denies getting pain medication at last visit. Patient requested pain medication and nerve pills for pinky this visit. Patient denies suicidal ideations, fever, chills, numbness, tingling. Patient states his caretaker has his money and wants to contact her. Denies SI.  Patient has a history of substance abuse.    Past Medical History  Diagnosis Date  . Schizophrenic disorder   . Boxer's fracture    History reviewed. No pertinent past surgical history. History reviewed. No pertinent family history. History  Substance Use Topics  . Smoking status: Current Every Day Smoker -- 1.00 packs/day for 15 years    Types: Cigarettes  . Smokeless tobacco: Never Used  . Alcohol Use: No    Review of Systems  Constitutional: Negative for fever and chills.  Musculoskeletal: Positive for arthralgias (pain in right pinky).  Neurological: Negative for weakness and numbness.  Psychiatric/Behavioral: Negative for suicidal ideas.    Allergies  Haldol  Home Medications   Current Outpatient Rx  Name  Route  Sig  Dispense  Refill  . HYDROcodone-acetaminophen (NORCO/VICODIN) 5-325 MG per tablet   Oral   Take 1-2  tablets by mouth every 6 (six) hours as needed for pain.   15 tablet   0    Triage Vitals: BP 113/78  Pulse 119  Temp(Src) 98 F (36.7 C) (Oral)  Resp 20  SpO2 99%  Physical Exam  Nursing note and vitals reviewed. Constitutional: He appears well-developed and well-nourished. No distress.  HENT:  Head: Normocephalic and atraumatic.  Neck: Neck supple.  Pulmonary/Chest: Effort normal.  Musculoskeletal:  Splint removed. Right hand has tenderness and very mild ecchymosis of palm around the fourth and fifth metacarpal. Sensation is intact. Patient is able to move all digits. Capillary refill is less than 2 seconds throughout. Skin is intact. Radial pulse intact.  Neurological: He is alert.  Skin: He is not diaphoretic.    ED Course  Procedures (including critical care time)  DIAGNOSTIC STUDIES: Oxygen Saturation is 99% on room air, normal by my interpretation.    COORDINATION OF CARE:  5:16 PM - Patient verbalizes understanding and agrees with treatment plan.    Labs Review Labs Reviewed - No data to display Imaging Review No results found.  MDM   1. Fracture of fifth metacarpal bone of right hand, sequela    patient with known fifth metacarpal fracture of right hand presenting with increased pain in his fifth finger and concern about the skin underneath his splint.. Per orthopedic tech, patient has been back multiple times to have the splint taken off and put back on. Patient denies any  new injury to the hand itself. With the splint removed there are no concerning findings on exam. No evidence of infection. Skin is intact. Neurovascularly intact. Splint placed again. Patient referred to hand surgery. Patient's other main complaints regard to his homelessness and social situation. Pt given return precautions.    I personally performed the services described in this documentation, which was scribed in my presence. The recorded information has been reviewed and is accurate.     Trixie Dredge, PA-C 05/30/13 1809
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
## 2013-05-31 NOTE — ED Provider Notes (Signed)
Medical screening examination/treatment/procedure(s) were performed by non-physician practitioner and as supervising physician I was immediately available for consultation/collaboration.   Dagmar Hait, MD 05/31/13 [phone]

## 2013-05-31 NOTE — ED Provider Notes (Signed)
Medical screening examination/treatment/procedure(s) were performed by non-physician practitioner and as supervising physician I was immediately available for consultation/collaboration.   Dagmar Hait, MD 05/31/13 [phone]

## 2013-06-02 ENCOUNTER — Emergency Department (HOSPITAL_COMMUNITY)
Admission: EM | Admit: 2013-06-02 | Discharge: 2013-06-05 | Disposition: A | Discharge status: Home / Self Care | Payer: Medicare Other | Attending: Emergency Medicine | Admitting: Emergency Medicine

## 2013-06-02 ENCOUNTER — Encounter (HOSPITAL_COMMUNITY): Payer: Self-pay | Admitting: *Deleted

## 2013-06-02 DIAGNOSIS — F203 Undifferentiated schizophrenia: Secondary | ICD-10-CM | POA: Diagnosis present

## 2013-06-02 DIAGNOSIS — F911 Conduct disorder, childhood-onset type: Secondary | ICD-10-CM | POA: Insufficient documentation

## 2013-06-02 DIAGNOSIS — S62339D Displaced fracture of neck of unspecified metacarpal bone, subsequent encounter for fracture with routine healing: Secondary | ICD-10-CM

## 2013-06-02 DIAGNOSIS — Z4789 Encounter for other orthopedic aftercare: Secondary | ICD-10-CM | POA: Insufficient documentation

## 2013-06-02 DIAGNOSIS — F17200 Nicotine dependence, unspecified, uncomplicated: Secondary | ICD-10-CM | POA: Insufficient documentation

## 2013-06-02 DIAGNOSIS — F1410 Cocaine abuse, uncomplicated: Secondary | ICD-10-CM | POA: Insufficient documentation

## 2013-06-02 DIAGNOSIS — IMO0002 Reserved for concepts with insufficient information to code with codable children: Secondary | ICD-10-CM | POA: Insufficient documentation

## 2013-06-02 DIAGNOSIS — F22 Delusional disorders: Secondary | ICD-10-CM | POA: Insufficient documentation

## 2013-06-02 DIAGNOSIS — R443 Hallucinations, unspecified: Secondary | ICD-10-CM | POA: Insufficient documentation

## 2013-06-02 DIAGNOSIS — F259 Schizoaffective disorder, unspecified: Secondary | ICD-10-CM

## 2013-06-02 DIAGNOSIS — F909 Attention-deficit hyperactivity disorder, unspecified type: Secondary | ICD-10-CM | POA: Insufficient documentation

## 2013-06-02 LAB — ACETAMINOPHEN LEVEL: Acetaminophen (Tylenol), Serum: <15 ug/mL (ref 10–30)

## 2013-06-02 LAB — COMPREHENSIVE METABOLIC PANEL
Alkaline Phosphatase: 104 U/L (ref 39–117)
BUN: 7 mg/dL (ref 6–23)
Chloride: 104 mEq/L (ref 96–112)
GFR calc Af Amer: >90 mL/min (ref >90)
GFR calc non Af Amer: >90 mL/min (ref >90)
Glucose, Bld: 88 mg/dL (ref 70–99)
Potassium: 3.8 mEq/L (ref 3.5–5.1)
Total Bilirubin: 0.5 mg/dL (ref 0.3–1.2)

## 2013-06-02 LAB — CBC
HCT: 42.3 % (ref 39.0–52.0)
Hemoglobin: 14.9 g/dL (ref 13.0–17.0)
WBC: 8.1 10*3/uL (ref 4.0–10.5)

## 2013-06-02 LAB — ETHANOL: Alcohol, Ethyl (B): <11 mg/dL (ref 0–11)

## 2013-06-02 LAB — RAPID URINE DRUG SCREEN, HOSP PERFORMED: Barbiturates: NOT DETECTED

## 2013-06-02 MED ORDER — NICOTINE 21 MG/24HR TD PT24
21.0000 mg | MEDICATED_PATCH | Freq: Every day | TRANSDERMAL | Status: DC
  Filled 2013-06-02 (×3): qty 1

## 2013-06-02 MED ORDER — LORAZEPAM 1 MG PO TABS
1.0000 mg | ORAL_TABLET | Freq: Three times a day (TID) | ORAL | Status: DC | PRN
  Administered 2013-06-02 – 2013-06-04 (×2): 1 mg via ORAL
  Filled 2013-06-02 (×2): qty 1

## 2013-06-02 MED ORDER — ACETAMINOPHEN 325 MG PO TABS
650.0000 mg | ORAL_TABLET | ORAL | Status: DC | PRN
  Administered 2013-06-04: 650 mg via ORAL
  Filled 2013-06-02: qty 2

## 2013-06-02 MED ORDER — OLANZAPINE 5 MG PO TABS
15.0000 mg | ORAL_TABLET | Freq: Every day | ORAL | Status: DC
  Administered 2013-06-02 – 2013-06-03 (×2): 15 mg via ORAL
  Filled 2013-06-02 (×2): qty 3
  Filled 2013-06-02: qty 2

## 2013-06-02 MED ORDER — ZOLPIDEM TARTRATE 5 MG PO TABS
5.0000 mg | ORAL_TABLET | Freq: Every evening | ORAL | Status: DC | PRN
  Administered 2013-06-02 – 2013-06-04 (×2): 5 mg via ORAL
  Filled 2013-06-02 (×2): qty 1

## 2013-06-02 MED ORDER — ONDANSETRON HCL 4 MG PO TABS: 4.0000 mg | ORAL_TABLET | Freq: Three times a day (TID) | ORAL | Status: DC | PRN

## 2013-06-02 MED ORDER — DIVALPROEX SODIUM ER 500 MG PO TB24
500.0000 mg | ORAL_TABLET | Freq: Every day | ORAL | Status: DC
  Administered 2013-06-02 – 2013-06-04 (×3): 500 mg via ORAL
  Filled 2013-06-02 (×5): qty 1

## 2013-06-02 NOTE — ED Notes (Signed)
Bed: MW10 Expected date:  Expected time:  Means of arrival:  Comments: Hold for Triage 3

## 2013-06-02 NOTE — ED Notes (Signed)
Pt changed into paper scrubs, wanded by security. Cash and coins locked in safety deposit box #5 in security office total $46.00. Pt ambulatory to BR, very loud laughing noted. Soiled splint in place on R wrist.

## 2013-06-02 NOTE — ED Notes (Signed)
Pt given scrubs and socks, [employer] at bedside.
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
## 2013-06-02 NOTE — ED Provider Notes (Addendum)
Medical screening examination/treatment/procedure(s) were conducted as a shared visit with non-physician practitioner(s) and myself.  I personally evaluated the patient during the encounter  Patient has been noncompliant with his Zyprexa and his Depakote.  These will be restarted.  The patient has a history of schizophrenia.  He will need assessment and placement at this time.  Lyanne Co, MD 06/02/13 1610  Lyanne Co, MD 06/02/13 9604  Lyanne Co, MD 06/02/13 [phone]

## 2013-06-03 ENCOUNTER — Encounter (HOSPITAL_COMMUNITY): Payer: Self-pay | Admitting: Psychiatry

## 2013-06-03 DIAGNOSIS — F209 Schizophrenia, unspecified: Secondary | ICD-10-CM | POA: Diagnosis not present

## 2013-06-03 DIAGNOSIS — F259 Schizoaffective disorder, unspecified: Secondary | ICD-10-CM | POA: Diagnosis not present

## 2013-06-03 DIAGNOSIS — F200 Paranoid schizophrenia: Secondary | ICD-10-CM | POA: Diagnosis not present

## 2013-06-03 DIAGNOSIS — F29 Unspecified psychosis not due to a substance or known physiological condition: Secondary | ICD-10-CM | POA: Diagnosis not present

## 2013-06-03 NOTE — ED Notes (Signed)
Pt currently asleep; no s/s of distress noted. Respirations regular and unlabored. 
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
## 2013-06-03 NOTE — ED Provider Notes (Signed)
CSN: [id number]     Arrival date & time 06/02/13  1833 History   First MD Initiated Contact with Patient 06/02/13 1847     Chief Complaint  Patient presents with  . Medical Clearance   (Consider location/radiation/quality/duration/timing/severity/associated sxs/prior Treatment) Patient is a 37 y.o. male presenting with mental health disorder. The history is provided by the patient. No language interpreter was used.  Mental Health Problem Presenting symptoms: bizarre behavior, disorganized speech, disorganized thought process and hallucinations   Patient accompanied by:  Law enforcement Associated symptoms comment:  Arrives via [employer] actively hallucinating, delusional, stating to staff that he believes he is pregnant, and endorses hearing voices. He is quick to aggression/agitation.   Past Medical History  Diagnosis Date  . Schizophrenic disorder   . Boxer's fracture    History reviewed. No pertinent past surgical history. History reviewed. No pertinent family history. History  Substance Use Topics  . Smoking status: Current Every Day Smoker -- 1.00 packs/day for 15 years    Types: Cigarettes  . Smokeless tobacco: Never Used  . Alcohol Use: No    Review of Systems  Unable to perform ROS Psychiatric/Behavioral: Positive for hallucinations.    Allergies  Haldol  Home Medications   Current Outpatient Rx  Name  Route  Sig  Dispense  Refill  . HYDROcodone-acetaminophen (NORCO/VICODIN) 5-325 MG per tablet   Oral   Take 1-2 tablets by mouth every 6 (six) hours as needed for pain.   15 tablet   0    BP 134/86  Pulse 103  Temp(Src) 97.8 F (36.6 C) (Oral)  Resp 18  SpO2 96% Physical Exam  Constitutional: He appears well-developed and well-nourished.  HENT:  Head: Normocephalic.  Neck: Normal range of motion. Neck supple.  Cardiovascular: Normal rate and regular rhythm.   Pulmonary/Chest: Effort normal and breath sounds normal.  Abdominal: Soft. Bowel sounds are  normal. There is no tenderness. There is no rebound and no guarding.  Musculoskeletal: Normal range of motion.  Neurological: He is alert. Coordination normal.  Skin: Skin is warm and dry. No rash noted.  Psychiatric: His affect is angry. His speech is rapid and/or pressured. He is agitated, hyperactive and actively hallucinating. Thought content is paranoid and delusional. He expresses inappropriate judgment.    ED Course  Procedures (including critical care time) Labs Review Labs Reviewed  SALICYLATE LEVEL - Abnormal; Notable for the following:    Salicylate Lvl <2.0 (*)    All other components within normal limits  ACETAMINOPHEN LEVEL  CBC  COMPREHENSIVE METABOLIC PANEL  ETHANOL  URINE RAPID DRUG SCREEN (HOSP PERFORMED)   Imaging Review No results found.  MDM  No diagnosis found. 1. Delusional  IVC prepared and processed. [facility] to evaluate for psychiatric admission.    Arnoldo Hooker, PA-C 06/04/13 [phone]

## 2013-06-03 NOTE — ED Notes (Signed)
Counselor attempted to assess patient however patient is sleepy and inattentive. Will re-attempt when patient is alert.   Janann Colonel., MSW, LCSW-A Therapeutic Triage Specialist

## 2013-06-03 NOTE — ED Notes (Signed)
Report given to [facility] and pt transferred to psych ED rm 39. Vwilliams,rn.

## 2013-06-04 DIAGNOSIS — F259 Schizoaffective disorder, unspecified: Secondary | ICD-10-CM | POA: Diagnosis not present

## 2013-06-04 DIAGNOSIS — F200 Paranoid schizophrenia: Secondary | ICD-10-CM | POA: Diagnosis not present

## 2013-06-04 DIAGNOSIS — F29 Unspecified psychosis not due to a substance or known physiological condition: Secondary | ICD-10-CM | POA: Diagnosis not present

## 2013-06-04 MED ORDER — RISPERIDONE 1 MG PO TBDP
1.0000 mg | ORAL_TABLET | Freq: Two times a day (BID) | ORAL | Status: DC
  Administered 2013-06-04 – 2013-06-05 (×3): 1 mg via ORAL
  Filled 2013-06-04 (×4): qty 1

## 2013-06-04 MED ORDER — DIVALPROEX SODIUM ER 500 MG PO TB24
1000.0000 mg | ORAL_TABLET | Freq: Every day | ORAL | Status: DC
  Filled 2013-06-04: qty 2
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
## 2013-06-05 ENCOUNTER — Encounter (HOSPITAL_COMMUNITY): Payer: Self-pay | Admitting: Registered Nurse

## 2013-06-05 DIAGNOSIS — F209 Schizophrenia, unspecified: Secondary | ICD-10-CM | POA: Diagnosis not present

## 2013-06-05 MED ORDER — DIVALPROEX SODIUM ER 500 MG PO TB24: 1000.0000 mg | ORAL_TABLET | Freq: Every day | ORAL | Status: DC

## 2013-06-05 MED ORDER — RISPERIDONE 2 MG PO TBDP: 2.0000 mg | ORAL_TABLET | Freq: Every day | ORAL | Status: DC

## 2013-06-05 MED ORDER — RISPERIDONE 2 MG PO TABS: 2.0000 mg | ORAL_TABLET | Freq: Every day | ORAL | Status: DC

## 2013-06-05 MED ORDER — RISPERIDONE MICROSPHERES 25 MG IM SUSR
25.0000 mg | INTRAMUSCULAR | Status: DC
  Administered 2013-06-05: 25 mg via INTRAMUSCULAR
  Filled 2013-06-05: qty 2

## 2013-06-05 MED ORDER — RISPERIDONE MICROSPHERES 25 MG IM SUSR: 25.0000 mg | INTRAMUSCULAR | Status: DC

## 2013-06-05 MED ORDER — HYDROCODONE-ACETAMINOPHEN 5-325 MG PO TABS: ORAL_TABLET | ORAL | Status: DC

## 2013-06-05 NOTE — Consult Note (Signed)
Pt discussed, note reviewed and agreed with 

## 2013-06-05 NOTE — Consult Note (Signed)
  Follow up Progress Note: Face to face interview and consult with Dr. Saunders Revel T [id number]  Subjective:  Patient states that he is feeling good and is ready to go home.   "I didn't ask to come here no way.  I was sitting in front of the [employer] and they told me I had to leave cause I was scaring off customers.  The didn't have no damn customer.  Next thing I know the police was bring me here."  Patient states that he is staying at the [facility].  Patient states that he has not been able to get in touch with [facility] and has now way to get his money and that they have not been helping with his medication or living situation.  Patient states that he has set up with [facility] to get ACT.     Current Medication Current facility-administered medications:acetaminophen (TYLENOL) tablet 650 mg, 650 mg, Oral, Q4H PRN, Shari A Upstill, PA-C, 650 mg at 06/04/13 2129;  divalproex (DEPAKOTE ER) 24 hr tablet 1,000 mg, 1,000 mg, Oral, QHS, Earney Navy, NP;  LORazepam (ATIVAN) tablet 1 mg, 1 mg, Oral, Q8H PRN, Shari A Upstill, PA-C, 1 mg at 06/04/13 1656 nicotine (NICODERM CQ - dosed in mg/24 hours) patch 21 mg, 21 mg, Transdermal, Daily, Shari A Upstill, PA-C;  ondansetron (ZOFRAN) tablet 4 mg, 4 mg, Oral, Q8H PRN, Shari A Upstill, PA-C;  [START ON 06/06/2013] risperiDONE (RISPERDAL M-TABS) disintegrating tablet 2 mg, 2 mg, Oral, QHS, Severino Paolo, NP;  risperiDONE microspheres (RISPERDAL CONSTA) injection 25 mg, 25 mg, Intramuscular, Q14 Days, Gaylen Pereira, NP zolpidem (AMBIEN) tablet 5 mg, 5 mg, Oral, QHS PRN, Shari A Upstill, PA-C, 5 mg at 06/04/13 2124 Current outpatient prescriptions:HYDROcodone-acetaminophen (NORCO/VICODIN) 5-325 MG per tablet, Take 1-2 tablets by mouth every 6 (six) hours as needed for pain., Disp: 15 tablet, Rfl: 0  Assessment @ Axis I: Schizopherina Axis II: Deferred Axis III:  Past Medical History  Diagnosis Date  . Schizophrenic disorder    . Boxer's fracture    Axis IV: housing problems, other psychosocial or environmental problems, problems related to legal system/crime, problems related to social environment, problems with access to health care services and problems with primary support group Axis V: 61-70 mild symptoms    Plan  Recommendation:  Start Risperdal Consta 25 mg IM Q 2 weeks.  Inform [facility] ACT patient location in order to follow up with him.  Give refills for medication.    Disposition:  Discharge home to follow up with [facility].  Dontavia Brand, FNP-BC

## 2013-06-05 NOTE — ED Notes (Signed)
Pt sts "fucking [employer] people called the police b/c im homeless and that's why im here.  i need to go back to [facility] to get my hand looked at.  im tired of being single and i need a woman.  Please find my case worker, Milton Ferguson, b/c shes my payee so i can get my money.  [phone] or [phone].   Pt denies any issues and sts he needs to be d/ced so he can get his money once he finds danielle little.  Pt is wearing a splint that he sts was applied at another hospital a month ago.  Denies pain

## 2013-06-05 NOTE — ED Provider Notes (Signed)
Called by Baxter Hire, Child psychotherapist that patient is ready for discharge to go to [facility]. Pt ambulatory in his room. States he is going to f/u at [facility].  He is noted to have a ulnar gutter splint. Review of his records shows a [facility] fx on 8/23 with referral to Dr Izora Ribas. Discharge prescriptions were written by psych PA except for pain meds.   Devoria Albe, MD, Armando Gang     Ward Givens, MD 06/05/13 [phone]

## 2013-06-06 NOTE — ED Provider Notes (Signed)
Medical screening examination/treatment/procedure(s) were performed by non-physician practitioner and as supervising physician I was immediately available for consultation/collaboration.  Lyanne Co, MD 06/06/13 [phone]

## 2013-06-10 ENCOUNTER — Emergency Department (EMERGENCY_DEPARTMENT_HOSPITAL)
Admission: EM | Admit: 2013-06-10 | Discharge: 2013-06-11 | Disposition: A | Discharge status: Other Acute Inpatient Hospital | Payer: Medicare Other | Source: Home / Self Care | Attending: Emergency Medicine | Admitting: Emergency Medicine

## 2013-06-10 ENCOUNTER — Encounter (HOSPITAL_COMMUNITY): Payer: Self-pay

## 2013-06-10 DIAGNOSIS — Z9119 Patient's noncompliance with other medical treatment and regimen: Secondary | ICD-10-CM | POA: Diagnosis not present

## 2013-06-10 DIAGNOSIS — Z008 Encounter for other general examination: Secondary | ICD-10-CM | POA: Diagnosis not present

## 2013-06-10 DIAGNOSIS — F209 Schizophrenia, unspecified: Secondary | ICD-10-CM | POA: Diagnosis not present

## 2013-06-10 DIAGNOSIS — F309 Manic episode, unspecified: Secondary | ICD-10-CM | POA: Diagnosis present

## 2013-06-10 DIAGNOSIS — F259 Schizoaffective disorder, unspecified: Secondary | ICD-10-CM | POA: Diagnosis not present

## 2013-06-10 DIAGNOSIS — R45851 Suicidal ideations: Secondary | ICD-10-CM | POA: Insufficient documentation

## 2013-06-10 DIAGNOSIS — R45850 Homicidal ideations: Secondary | ICD-10-CM

## 2013-06-10 DIAGNOSIS — Z79899 Other long term (current) drug therapy: Secondary | ICD-10-CM | POA: Insufficient documentation

## 2013-06-10 DIAGNOSIS — Z5900 Homelessness unspecified: Secondary | ICD-10-CM | POA: Insufficient documentation

## 2013-06-10 DIAGNOSIS — F911 Conduct disorder, childhood-onset type: Secondary | ICD-10-CM | POA: Insufficient documentation

## 2013-06-10 DIAGNOSIS — Z8781 Personal history of (healed) traumatic fracture: Secondary | ICD-10-CM | POA: Insufficient documentation

## 2013-06-10 DIAGNOSIS — IMO0002 Reserved for concepts with insufficient information to code with codable children: Secondary | ICD-10-CM | POA: Insufficient documentation

## 2013-06-10 DIAGNOSIS — F603 Borderline personality disorder: Secondary | ICD-10-CM | POA: Diagnosis present

## 2013-06-10 DIAGNOSIS — G4700 Insomnia, unspecified: Secondary | ICD-10-CM | POA: Diagnosis not present

## 2013-06-10 DIAGNOSIS — F17200 Nicotine dependence, unspecified, uncomplicated: Secondary | ICD-10-CM | POA: Insufficient documentation

## 2013-06-10 LAB — COMPREHENSIVE METABOLIC PANEL
ALT: 16 U/L (ref 0–53)
BUN: 9 mg/dL (ref 6–23)
CO2: 26 mEq/L (ref 19–32)
Calcium: 9.4 mg/dL (ref 8.4–10.5)
Creatinine, Ser: 0.76 mg/dL (ref 0.50–1.35)
GFR calc Af Amer: >90 mL/min (ref >90)
GFR calc non Af Amer: >90 mL/min (ref >90)
Glucose, Bld: 98 mg/dL (ref 70–99)
Total Protein: 7.3 g/dL (ref 6.0–8.3)

## 2013-06-10 LAB — SALICYLATE LEVEL: Salicylate Lvl: <2 mg/dL — ABNORMAL LOW (ref 2.8–20.0)

## 2013-06-10 LAB — CBC WITH DIFFERENTIAL/PLATELET
Eosinophils Absolute: 0.4 10*3/uL (ref 0.0–0.7)
Eosinophils Relative: 5 % (ref 0–5)
HCT: 42.4 % (ref 39.0–52.0)
Lymphocytes Relative: 29 % (ref 12–46)
Lymphs Abs: 2.3 10*3/uL (ref 0.7–4.0)
MCH: 31.7 pg (ref 26.0–34.0)
MCV: 90.2 fL (ref 78.0–100.0)
Monocytes Absolute: 0.3 10*3/uL (ref 0.1–1.0)
Monocytes Relative: 4 % (ref 3–12)
Platelets: 215 10*3/uL (ref 150–400)
RBC: 4.7 MIL/uL (ref 4.22–5.81)
WBC: 8.1 10*3/uL (ref 4.0–10.5)

## 2013-06-10 LAB — ETHANOL: Alcohol, Ethyl (B): <11 mg/dL (ref 0–11)

## 2013-06-10 LAB — ACETAMINOPHEN LEVEL: Acetaminophen (Tylenol), Serum: <15 ug/mL (ref 10–30)

## 2013-06-10 MED ORDER — LORAZEPAM 1 MG PO TABS
1.0000 mg | ORAL_TABLET | Freq: Three times a day (TID) | ORAL | Status: DC | PRN
  Administered 2013-06-11: 1 mg via ORAL
  Filled 2013-06-10: qty 2

## 2013-06-10 MED ORDER — ACETAMINOPHEN 325 MG PO TABS
650.0000 mg | ORAL_TABLET | ORAL | Status: DC | PRN
  Administered 2013-06-11: 650 mg via ORAL
  Filled 2013-06-10: qty 2

## 2013-06-10 MED ORDER — NICOTINE 21 MG/24HR TD PT24: 21.0000 mg | MEDICATED_PATCH | Freq: Every day | TRANSDERMAL | Status: DC

## 2013-06-10 MED ORDER — RISPERIDONE 2 MG PO TBDP
2.0000 mg | ORAL_TABLET | Freq: Every day | ORAL | Status: DC
  Administered 2013-06-10 – 2013-06-11 (×2): 2 mg via ORAL
  Filled 2013-06-10 (×2): qty 1

## 2013-06-10 MED ORDER — ALUM & MAG HYDROXIDE-SIMETH 200-200-20 MG/5ML PO SUSP: 30.0000 mL | ORAL | Status: DC | PRN

## 2013-06-10 MED ORDER — IBUPROFEN 200 MG PO TABS: 600.0000 mg | ORAL_TABLET | Freq: Three times a day (TID) | ORAL | Status: DC | PRN

## 2013-06-10 MED ORDER — ONDANSETRON HCL 4 MG PO TABS: 4.0000 mg | ORAL_TABLET | Freq: Three times a day (TID) | ORAL | Status: DC | PRN

## 2013-06-10 MED ORDER — ZOLPIDEM TARTRATE 5 MG PO TABS: 5.0000 mg | ORAL_TABLET | Freq: Every evening | ORAL | Status: DC | PRN

## 2013-06-10 MED ORDER — DIVALPROEX SODIUM ER 500 MG PO TB24
1000.0000 mg | ORAL_TABLET | Freq: Every day | ORAL | Status: DC
  Administered 2013-06-10 – 2013-06-11 (×2): 1000 mg via ORAL
  Filled 2013-06-10 (×2): qty 2

## 2013-06-10 MED ORDER — LORAZEPAM 2 MG/ML IJ SOLN
2.0000 mg | Freq: Once | INTRAMUSCULAR | Status: AC
  Administered 2013-06-10: 2 mg via INTRAMUSCULAR
  Filled 2013-06-10: qty 1

## 2013-06-10 NOTE — Consult Note (Signed)
  Pt is currently aggressively psychotic and no medication orders.Reviewed D/C meds from recent hospitalization. Meds ordered.Pt is refusing assessments at this time in his present psychotic state.Hopefully he will be able to more fully assessed after medication.
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
## 2013-06-10 NOTE — ED Notes (Signed)
Bed: [facility] Expected date:  Expected time:  Means of arrival:  Comments: [place], triage 4

## 2013-06-10 NOTE — ED Provider Notes (Signed)
Medical screening examination/treatment/procedure(s) were conducted as a shared visit with non-physician practitioner(s) and myself.  I personally evaluated the patient during the encounter  37 year old male with a history of schizophrenia presenting with acute psychosis and homicidal ideation. Thinks that he is God. Stated to me "I will kill anyone who gets in my way, and I mean it." On exam, no distress, slightly agitated, normal respirations, normal speech pattern, abnormal thought content including homicidal ideation. Lab work normal.  Does not appear to have an organic cause of his symptoms. Plan IVC, psychiatric evaluation.   Clinical Impression: 1. Medical clearance for psychiatric admission   2. Homicidal ideation   3. Schizophrenia       Candyce Churn, MD 06/10/13 [phone]
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
## 2013-06-10 NOTE — ED Notes (Signed)
[employer] left the bedside. Patient calm at this time. Peanut butter and graham crackers given tot he patient.
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
## 2013-06-10 NOTE — ED Notes (Signed)
Pt refused to give staff a urine sample.

## 2013-06-11 ENCOUNTER — Encounter (HOSPITAL_COMMUNITY): Payer: Self-pay | Admitting: Behavioral Health

## 2013-06-11 ENCOUNTER — Inpatient Hospital Stay (HOSPITAL_COMMUNITY)
Admission: AD | Admit: 2013-06-11 | Discharge: 2013-06-19 | DRG: 885 | Disposition: A | Discharge status: Home / Self Care | Payer: Medicare Other | Source: Intra-hospital | Attending: Psychiatry | Admitting: Psychiatry

## 2013-06-11 DIAGNOSIS — Z008 Encounter for other general examination: Secondary | ICD-10-CM

## 2013-06-11 DIAGNOSIS — F17200 Nicotine dependence, unspecified, uncomplicated: Secondary | ICD-10-CM | POA: Diagnosis present

## 2013-06-11 DIAGNOSIS — Z91199 Patient's noncompliance with other medical treatment and regimen due to unspecified reason: Secondary | ICD-10-CM

## 2013-06-11 DIAGNOSIS — R45850 Homicidal ideations: Secondary | ICD-10-CM

## 2013-06-11 DIAGNOSIS — F309 Manic episode, unspecified: Secondary | ICD-10-CM | POA: Diagnosis present

## 2013-06-11 DIAGNOSIS — F603 Borderline personality disorder: Secondary | ICD-10-CM | POA: Diagnosis present

## 2013-06-11 DIAGNOSIS — Z9119 Patient's noncompliance with other medical treatment and regimen: Secondary | ICD-10-CM

## 2013-06-11 DIAGNOSIS — F259 Schizoaffective disorder, unspecified: Principal | ICD-10-CM | POA: Diagnosis present

## 2013-06-11 DIAGNOSIS — F1410 Cocaine abuse, uncomplicated: Secondary | ICD-10-CM

## 2013-06-11 DIAGNOSIS — G4700 Insomnia, unspecified: Secondary | ICD-10-CM | POA: Diagnosis present

## 2013-06-11 MED ORDER — TRAZODONE HCL 50 MG PO TABS
50.0000 mg | ORAL_TABLET | Freq: Every evening | ORAL | Status: DC | PRN
  Administered 2013-06-11 – 2013-06-14 (×2): 50 mg via ORAL
  Filled 2013-06-11: qty 1
  Filled 2013-06-11: qty 6
  Filled 2013-06-11: qty 1

## 2013-06-11 MED ORDER — ALUM & MAG HYDROXIDE-SIMETH 200-200-20 MG/5ML PO SUSP: 30.0000 mL | ORAL | Status: DC | PRN

## 2013-06-11 MED ORDER — ACETAMINOPHEN 325 MG PO TABS
650.0000 mg | ORAL_TABLET | Freq: Four times a day (QID) | ORAL | Status: DC | PRN
  Administered 2013-06-12: 650 mg via ORAL

## 2013-06-11 MED ORDER — MAGNESIUM HYDROXIDE 400 MG/5ML PO SUSP: 30.0000 mL | Freq: Every day | ORAL | Status: DC | PRN

## 2013-06-11 NOTE — Progress Notes (Signed)
[facility] ED CM noted medicare pt without pcp listed Spoke with pt who confirms no pcp but aware of how to get one

## 2013-06-11 NOTE — ED Provider Notes (Signed)
Patient accepted at [facility] for psychosis. Stable for transfer.   Richardean Canal, MD 06/11/13 2040

## 2013-06-11 NOTE — ED Provider Notes (Signed)
Medical screening examination/treatment/procedure(s) were conducted as a shared visit with non-physician practitioner(s) and myself.  I personally evaluated the patient during the encounter.   Please see my separate note.     Candyce Churn, MD 06/11/13 [phone]

## 2013-06-11 NOTE — BH Assessment (Signed)
Writer faxed referral to Ros[facility] at [facility]. However, pt isn't on wait list yet. Berkley Harvey #[phone] 06/11/13 to 06/18/13.  [name], [place] Assessment Counselor

## 2013-06-11 NOTE — ED Notes (Signed)
Patient discharge in the custody of [employer] to be transported to [facility]. No acute distress noted
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
## 2013-06-11 NOTE — ED Notes (Addendum)
Patient was asked to go back to his room several times by staff while dealing with an irrate pt.but patient refused to go back and had to be escorted to his room by [facility] officer and security.

## 2013-06-11 NOTE — ED Notes (Signed)
Patient will be medicated per [facility] for agitation

## 2013-06-12 ENCOUNTER — Encounter (HOSPITAL_COMMUNITY): Payer: Self-pay | Admitting: Psychiatry

## 2013-06-12 DIAGNOSIS — F259 Schizoaffective disorder, unspecified: Principal | ICD-10-CM

## 2013-06-12 DIAGNOSIS — F1410 Cocaine abuse, uncomplicated: Secondary | ICD-10-CM

## 2013-06-12 MED ORDER — IBUPROFEN 600 MG PO TABS
ORAL_TABLET | ORAL | Status: AC
  Administered 2013-06-12: 600 mg via ORAL
  Filled 2013-06-12: qty 1

## 2013-06-12 MED ORDER — BENZTROPINE MESYLATE 1 MG PO TABS
1.0000 mg | ORAL_TABLET | Freq: Two times a day (BID) | ORAL | Status: DC
  Administered 2013-06-12 – 2013-06-19 (×14): 1 mg via ORAL
  Filled 2013-06-12 (×6): qty 1
  Filled 2013-06-12: qty 6
  Filled 2013-06-12 (×16): qty 1
  Filled 2013-06-12: qty 6

## 2013-06-12 MED ORDER — OLANZAPINE 5 MG PO TBDP
15.0000 mg | ORAL_TABLET | Freq: Three times a day (TID) | ORAL | Status: DC | PRN
  Administered 2013-06-12 – 2013-06-16 (×6): 15 mg via ORAL
  Filled 2013-06-12 (×2): qty 1
  Filled 2013-06-12: qty 3
  Filled 2013-06-12: qty 1

## 2013-06-12 MED ORDER — RISPERIDONE 2 MG PO TBDP: 2.0000 mg | ORAL_TABLET | Freq: Two times a day (BID) | ORAL | Status: DC

## 2013-06-12 MED ORDER — RISPERIDONE 2 MG PO TBDP
2.0000 mg | ORAL_TABLET | Freq: Two times a day (BID) | ORAL | Status: DC
  Administered 2013-06-12 – 2013-06-15 (×6): 2 mg via ORAL
  Filled 2013-06-12 (×8): qty 1

## 2013-06-12 MED ORDER — CITALOPRAM HYDROBROMIDE 10 MG PO TABS: 10.0000 mg | ORAL_TABLET | Freq: Every day | ORAL | Status: DC

## 2013-06-12 MED ORDER — HYDROXYZINE HCL 25 MG PO TABS
25.0000 mg | ORAL_TABLET | ORAL | Status: DC | PRN
  Administered 2013-06-12 – 2013-06-15 (×3): 25 mg via ORAL

## 2013-06-12 MED ORDER — CARBAMAZEPINE 200 MG PO TABS: 200.0000 mg | ORAL_TABLET | Freq: Two times a day (BID) | ORAL | Status: DC

## 2013-06-12 MED ORDER — OLANZAPINE 5 MG PO TBDP
ORAL_TABLET | ORAL | Status: AC
  Filled 2013-06-12: qty 1

## 2013-06-12 MED ORDER — IBUPROFEN 600 MG PO TABS
600.0000 mg | ORAL_TABLET | Freq: Four times a day (QID) | ORAL | Status: DC | PRN
  Administered 2013-06-12 – 2013-06-19 (×8): 600 mg via ORAL
  Filled 2013-06-12 (×8): qty 1

## 2013-06-12 MED ORDER — IBUPROFEN 400 MG PO TABS: 400.0000 mg | ORAL_TABLET | Freq: Four times a day (QID) | ORAL | Status: DC | PRN

## 2013-06-12 MED ORDER — OXCARBAZEPINE 300 MG PO TABS
300.0000 mg | ORAL_TABLET | Freq: Two times a day (BID) | ORAL | Status: DC
  Administered 2013-06-12 – 2013-06-13 (×3): 300 mg via ORAL
  Filled 2013-06-12 (×6): qty 1

## 2013-06-12 MED ORDER — OLANZAPINE 10 MG PO TBDP: 10.0000 mg | ORAL_TABLET | Freq: Three times a day (TID) | ORAL | Status: DC | PRN

## 2013-06-12 MED ORDER — OLANZAPINE 5 MG PO TBDP
ORAL_TABLET | ORAL | Status: AC
  Filled 2013-06-12: qty 3

## 2013-06-12 MED ORDER — RISPERIDONE 2 MG PO TBDP
3.0000 mg | ORAL_TABLET | Freq: Two times a day (BID) | ORAL | Status: DC
  Filled 2013-06-12 (×2): qty 1
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
## 2013-06-12 NOTE — Tx Team (Signed)
Initial Interdisciplinary Treatment Plan  PATIENT STRENGTHS: (choose at least two) Communication skills  PATIENT STRESSORS: Legal issue Medication change or noncompliance   PROBLEM LIST: Problem List/Patient Goals Date to be addressed Date deferred Reason deferred Estimated date of resolution  Psychosis 06/11/2013     Medication noncompliance 06/11/2013                                                DISCHARGE CRITERIA:  Improved stabilization in mood, thinking, and/or behavior Need for constant or close observation no longer present Verbal commitment to aftercare and medication compliance  PRELIMINARY DISCHARGE PLAN: Attend aftercare/continuing care group Placement in alternative living arrangements  PATIENT/FAMIILY INVOLVEMENT: This treatment plan has been presented to and reviewed with the patient, [name].  The patient and family have been given the opportunity to ask questions and make suggestions.  Angeline Slim M 06/12/2013, 12:02 AM

## 2013-06-12 NOTE — Progress Notes (Signed)
Psychoeducational Group Note  Date:  06/12/2013 Time:  2000  Group Topic/Focus:  Wrap-Up Group:   The focus of this group is to help patients review their daily goal of treatment and discuss progress on daily workbooks.  Participation Level: Did Not Attend  Participation Quality:  Not Applicable  Affect:  Not Applicable  Cognitive:  Not Applicable  Insight:  Not Applicable  Engagement in Group: Not Applicable  Additional Comments: The patient did not attend group this evening since he was asleep in his bedroom.   Hazle Coca S 06/12/2013, 8:47 PM

## 2013-06-12 NOTE — Progress Notes (Signed)
Patient ID: [name], male   DOB: [birth year], 37 y.o.   MRN: [id number] D:Patient angry and agitated today.  Patient met with treatment team and reported that his payee had his money.  He stated that it was taken out of the bank and he had nowhere to go.  Patient said, "I need to get a trailer."  Patient denied any drug use due to his UDS being negative, however, a couple of weeks ago it was positive for cocaine.  Patient stated, "the police picked me and put me in jail for no reason."  Patient requested to be discharged.  CW advised him he would locate payee's number and call her.  Patient has poor insight and labile mood.  This morning he was med seeking asking for ativan and pain meds.  He stated that he "wrung this man's neck I was so angry."  Patient became agitated during interview with treatment team.  He stood up yelling and slammed the door very hard.  A few minutes later, patient attempted to charge through the same door where the MD was sitting.  Staff had to intervene.  He was in hallway cursing loudly at staff.  MD ordered medication and it was administered with good results.A: Patient is currently asleep.  Continue to monitor patient's behavior and redirect as necessary.  R:Patient is unwilling to participate in his treatment.

## 2013-06-12 NOTE — [facility] Suicide Risk Assessment (Signed)
Suicide Risk Assessment  Admission Assessment     Nursing information obtained from:    Demographic factors:    Current Mental Status:    Loss Factors:    Historical Factors:    Risk Reduction Factors:     CLINICAL FACTORS:   Severe Anxiety and/or Agitation Bipolar Disorder:   Mixed State Alcohol/Substance Abuse/Dependencies Currently Psychotic Previous Psychiatric Diagnoses and Treatments  COGNITIVE FEATURES THAT CONTRIBUTE TO RISK:  Closed-mindedness Polarized thinking    SUICIDE RISK:   Minimal: No identifiable suicidal ideation.  Patients presenting with no risk factors but with morbid ruminations; may be classified as minimal risk based on the severity of the depressive symptoms  PLAN OF CARE:1. Admit for crisis management and stabilization. 2. Medication management to reduce current symptoms to base line and improve the     patient's overall level of functioning 3. Treat health problems as indicated. 4. Develop treatment plan to decrease risk of relapse upon discharge and the need for     readmission. 5. Psycho-social education regarding relapse prevention and self care. 6. Health care follow up as needed for medical problems. 7. Restart home medications where appropriate.   I certify that inpatient services furnished can reasonably be expected to improve the patient's condition.  Scharlene Catalina,MD 06/12/2013, 10:51 AM

## 2013-06-12 NOTE — [facility] Counselor (Signed)
Adult Psychosocial Assessment Update Interdisciplinary Team  Previous [facility] admissions/discharges:  Admissions Discharges  Date:05/03/13 Date:  Date:04/15/13 Date:  Date:2/13 Date:  Date:8/12 Date:  Date: Date:   Changes since the last Psychosocial Assessment (including adherence to outpatient mental health and/or substance abuse treatment, situational issues contributing to decompensation and/or relapse). "I was doing the best I could, but the law still picked me up.  I did not go to my follow up appointment.  Someone stole all the money out of my account."  [name] is homeless, has been visiting the ED regularly and has no outside providers.  He says he has court towards the end of the month, and he believes he will be locked up.             Discharge Plan 1. Will you be returning to the same living situation after discharge?   Yes: No:      If no, what is your plan?    Unknown       2. Would you like a referral for services when you are discharged? Yes:X     If yes, for what services?  No:       Mental health,  New payee       Summary and Recommendations (to be completed by the evaluator) [name] is back for a third time in as money months.  Although his UDS was clean this time, he has regularly been using cocaine and has not been taking his psychotropics.  His homeless does not seem to bother him, and he is not motivated to change anything significantly to avoid the hospital or jail.  He can benefit from crises stabilization, medication management, therapeutic milieu and referral for services.  It would be nice if he would agree to a referral to an group home.                       Signature:  Ida Rogue, 06/12/2013 4:50 PM

## 2013-06-12 NOTE — Progress Notes (Signed)
37 y/o male who presents involuntarily for psychosis.  Patient presents agitated and disorganized.  Patient cannot tell Clinical research associate why he is here.  Patient angry stating he does not want to be here.  Patient states he should just go to jail and get electrocuted and then everybody would die.  Patient denies SI/HI and denies AVH.  Patient states he is dealing with several legal charges.  Patient refused to sign paperwork but he did sign belonging sheet and treatment agreement.  Patient forwards little and the information he does forward it is disorganized and not answering direct questions.  Patient belongings secured in locker 45.  Patient escorted and oriented to the unit by [facility] MHT.

## 2013-06-12 NOTE — Progress Notes (Signed)
Patient ID: [name], male   DOB: Sep 10, 1976, 37 y.o.   MRN: [id number]   D: Pt banged on window of nurses station and mht went to assist. MHT later informed the writer that pt was upset because the sitter informed mht that pt's genitals were exposed. Pt called sitter a "snitch". MHT reminded pt of acceptable behavior and pt was dressed 2 gowns.

## 2013-06-12 NOTE — H&P (Signed)
Psychiatric Admission Assessment Adult  Patient Identification:  [name] Date of Evaluation:  06/12/2013 Chief Complaint:  Schizoaffective disorder-bipolar type History of Present Illness::This is a 37 year old male who presented to the [facility] and admitted involuntarily due to homicidal thoughts towards his payee for messing with his money, delusional thinking, aggressive behavior, agitation, non-compliant with his medications and mood lability. The patient was recently discharged from [facility] in August of 2014. Bertin was not compliant with his medications after leaving the hospital. He is a very poor historian during the interview and is able to forward little relevant information. His past admissions to [facility] were reviewed for most of the information. He is observed to have given the same story each time he came to the hospital. Patient states "I was hearing voices. I am God. I got very upset with my payee because I can't find her to give me some money." Patient became extremely agitated during the interview,  walked out of the room and slam the door very hard. Prior to that, he reports that he was not taking his medications, he denies suicidal ideations and drug use due to lack of money.   Elements:  Location:  [facility] inpatient. Associated Signs/Synptoms: Depression Symptoms:  psychomotor agitation, insomnia, (Hypo) Manic Symptoms:  Delusions, Distractibility, Elevated Mood, Flight of Ideas, Grandiosity, Hallucinations, Impulsivity, Irritable Mood, Labiality of Mood, Anxiety Symptoms:  Excessive Worry, Psychotic Symptoms:  Delusions, Hallucinations: Auditory PTSD Symptoms: NA  Psychiatric Specialty Exam: Physical Exam  Psychiatric: His affect is angry and labile. His speech is rapid and/or pressured and tangential. He is agitated, aggressive and actively hallucinating. Thought content is paranoid and delusional. He expresses impulsivity and inappropriate judgment. He expresses homicidal  ideation. He exhibits abnormal remote memory.    Review of Systems  Constitutional: Negative.   Eyes: Negative.   Respiratory: Negative.   Cardiovascular: Negative.   Gastrointestinal: Negative.   Genitourinary: Negative.   Musculoskeletal: Positive for joint pain.  Skin: Negative.   Neurological: Negative.   Endo/Heme/Allergies: Negative.   Psychiatric/Behavioral: Positive for hallucinations. The patient is nervous/anxious and has insomnia.     Blood pressure 113/80, pulse 115, temperature 96.9 F (36.1 C), temperature source Oral, resp. rate 20, height 5\' 10"  (1.778 m), weight 94.348 kg (208 lb).Body mass index is 29.84 kg/(m^2).  General Appearance: Disheveled  Eye Contact::  Good  Speech:  Pressured  Volume:  Increased  Mood:  Angry and Irritable  Affect:  Labile and Full Range  Thought Process:  Disorganized  Orientation:  Full (Time, Place, and Person)  Thought Content:  Delusions and Hallucinations  Suicidal Thoughts:  No  Homicidal Thoughts:  Yes.  without intent/plan  Memory:  Immediate;   Poor Recent;   Poor Remote;   Poor  Judgement:  Poor  Insight:  Lacking  Psychomotor Activity:  Increased  Concentration:  Poor  Recall:  Fair  Akathisia:  No  Handed:  Right  AIMS (if indicated):     Assets:  Financial Resources/Insurance  Sleep:  Number of Hours: 5    Past Psychiatric History: Diagnosis:  Hospitalizations:  Outpatient Care:  Substance Abuse Care:  Self-Mutilation:  Suicidal Attempts:  Violent Behaviors:   Past Medical History:   Past Medical History  Diagnosis Date  . Schizophrenic disorder   . Boxer's fracture     Allergies:   Allergies  Allergen Reactions  . Haldol [Haloperidol Decanoate] Other (See Comments)    'I curl up"   PTA Medications: Prescriptions prior to admission  Medication Sig Dispense Refill  . divalproex (DEPAKOTE ER) 500 MG 24 hr tablet Take 2 tablets (1,000 mg total) by mouth at bedtime.  60 tablet  0  .  HYDROcodone-acetaminophen (NORCO/VICODIN) 5-325 MG per tablet Take 1-2 tablets by mouth every 6 (six) hours as needed for pain.  15 tablet  0  . risperiDONE (RISPERDAL M-TABS) 2 MG disintegrating tablet Take 1 tablet (2 mg total) by mouth at bedtime.  3 tablet  0  . risperiDONE microspheres (RISPERDAL CONSTA) 25 MG injection Inject 2 mLs (25 mg total) into the muscle every 14 (fourteen) days.  1 each  0    Previous Psychotropic Medications:  Medication/Dose ; Zyprexa 20mg  po Qhs, Depakote ER 500mg  po BID                 Substance Abuse History in the last 12 months:  yes  Consequences of Substance Abuse: NA  Social History:  reports that he has been smoking Cigarettes.  He has a 15 pack-year smoking history. He has never used smokeless tobacco. He reports that  drinks alcohol. He reports that he does not use illicit drugs. Additional Social History:                      Current Place of Residence:   Place of Birth:   Family Members: Marital Status:  Single Children:  Sons:  Daughters: Relationships: Education:  unknown Educational Problems/Performance: Religious Beliefs/Practices: History of Abuse (Emotional/Phsycial/Sexual) Teacher, music History:  None. Legal History: Hobbies/Interests:  Family History:  History reviewed. No pertinent family history.  Results for orders placed during the hospital encounter of 06/10/13 (from the past 72 hour(s))  CBC WITH DIFFERENTIAL     Status: None   Collection Time    06/10/13  3:55 PM      Result Value Range   WBC 8.1  4.0 - 10.5 K/uL   RBC 4.70  4.22 - 5.81 MIL/uL   Hemoglobin 14.9  13.0 - 17.0 g/dL   HCT 16.1  09.6 - 04.5 %   MCV 90.2  78.0 - 100.0 fL   MCH 31.7  26.0 - 34.0 pg   MCHC 35.1  30.0 - 36.0 g/dL   RDW 40.9  81.1 - 91.4 %   Platelets 215  150 - 400 K/uL   Neutrophils Relative % 61  43 - 77 %   Neutro Abs 5.0  1.7 - 7.7 K/uL   Lymphocytes Relative 29  12 - 46 %   Lymphs Abs  2.3  0.7 - 4.0 K/uL   Monocytes Relative 4  3 - 12 %   Monocytes Absolute 0.3  0.1 - 1.0 K/uL   Eosinophils Relative 5  0 - 5 %   Eosinophils Absolute 0.4  0.0 - 0.7 K/uL   Basophils Relative 1  0 - 1 %   Basophils Absolute 0.1  0.0 - 0.1 K/uL  COMPREHENSIVE METABOLIC PANEL     Status: None   Collection Time    06/10/13  3:55 PM      Result Value Range   Sodium 137  135 - 145 mEq/L   Potassium 3.8  3.5 - 5.1 mEq/L   Chloride 101  96 - 112 mEq/L   CO2 26  19 - 32 mEq/L   Glucose, Bld 98  70 - 99 mg/dL   BUN 9  6 - 23 mg/dL   Creatinine, Ser 7.82  0.50 - 1.35 mg/dL   Calcium 9.4  8.4 - 10.5 mg/dL   Total Protein 7.3  6.0 - 8.3 g/dL   Albumin 4.1  3.5 - 5.2 g/dL   AST 14  0 - 37 U/L   ALT 16  0 - 53 U/L   Alkaline Phosphatase 95  39 - 117 U/L   Total Bilirubin 0.6  0.3 - 1.2 mg/dL   GFR calc non Af Amer >90  >90 mL/min   GFR calc Af Amer >90  >90 mL/min   Comment: (NOTE)     The eGFR has been calculated using the CKD EPI equation.     This calculation has not been validated in all clinical situations.     eGFR's persistently <90 mL/min signify possible Chronic Kidney     Disease.  ETHANOL     Status: None   Collection Time    06/10/13  3:55 PM      Result Value Range   Alcohol, Ethyl (B) <11  0 - 11 mg/dL   Comment:            LOWEST DETECTABLE LIMIT FOR     SERUM ALCOHOL IS 11 mg/dL     FOR MEDICAL PURPOSES ONLY  ACETAMINOPHEN LEVEL     Status: None   Collection Time    06/10/13  3:55 PM      Result Value Range   Acetaminophen (Tylenol), Serum <15.0  10 - 30 ug/mL   Comment:            THERAPEUTIC CONCENTRATIONS VARY     SIGNIFICANTLY. A RANGE OF 10-30     ug/mL MAY BE AN EFFECTIVE     CONCENTRATION FOR MANY PATIENTS.     HOWEVER, SOME ARE BEST TREATED     AT CONCENTRATIONS OUTSIDE THIS     RANGE.     ACETAMINOPHEN CONCENTRATIONS     >150 ug/mL AT 4 HOURS AFTER     INGESTION AND >50 ug/mL AT 12     HOURS AFTER INGESTION ARE     OFTEN ASSOCIATED WITH TOXIC      REACTIONS.  SALICYLATE LEVEL     Status: Abnormal   Collection Time    06/10/13  3:55 PM      Result Value Range   Salicylate Lvl <2.0 (*) 2.8 - 20.0 mg/dL   Psychological Evaluations:  Assessment:   DSM5:  Schizophrenia Disorders:  Delusional Disorder (297.1) and Schizophrenia (295.7) Obsessive-Compulsive Disorders:  none Trauma-Stressor Disorders:  [place] Substance/Addictive Disorders:  Cocaine use disorder by history Depressive Disorders:  Disruptive Mood Dysregulation Disorder (296.99)  AXIS I:  Schizoaffective Disorder bipolar type              Cocaine use disorder by history AXIS II:  Borderline Personality Dis. AXIS III:   Past Medical History  Diagnosis Date  . Boxer's fracture    AXIS IV:  housing problems, other psychosocial or environmental problems and problems related to social environment AXIS V:  21-30 behavior considerably influenced by delusions or hallucinations OR serious impairment in judgment, communication OR inability to function in almost all areas  Treatment Plan/Recommendations:  1. Admit for crisis management and stabilization. 2. Medication management to reduce current symptoms to base line and improve the     patient's overall level of functioning 3. Treat health problems as indicated. 4. Develop treatment plan to decrease risk of relapse upon discharge and the need for     readmission. 5. Psycho-social education regarding relapse prevention and self care. 6. Health care follow up  as needed for medical problems. 7. Restart home medications where appropriate. 8. Patient to continue current treatment regimen.  Treatment Plan Summary: Daily contact with patient to assess and evaluate symptoms and progress in treatment Medication management Current Medications:  Current Facility-Administered Medications  Medication Dose Route Frequency Provider Last Rate Last Dose  . alum & mag hydroxide-simeth (MAALOX/MYLANTA) 200-200-20 MG/5ML suspension 30 mL   30 mL Oral Q4H PRN Evanna Janann August, NP      . benztropine (COGENTIN) tablet 1 mg  1 mg Oral BID Raliegh Scobie   1 mg at 06/12/13 1008  . hydrOXYzine (ATARAX/VISTARIL) tablet 25 mg  25 mg Oral Q4H PRN Miliani Deike   25 mg at 06/12/13 1008  . ibuprofen (ADVIL,MOTRIN) tablet 600 mg  600 mg Oral Q6H PRN Anh Bigos   600 mg at 06/12/13 1015  . magnesium hydroxide (MILK OF MAGNESIA) suspension 30 mL  30 mL Oral Daily PRN Evanna Cori Burkett, NP      . OLANZapine zydis (ZYPREXA) 5 MG disintegrating tablet           . OLANZapine zydis (ZYPREXA) disintegrating tablet 15 mg  15 mg Oral Q8H PRN Carlon Davidson   15 mg at 06/12/13 1008  . Oxcarbazepine (TRILEPTAL) tablet 300 mg  300 mg Oral BID Thereasa Iannello   300 mg at 06/12/13 1008  . risperiDONE (RISPERDAL M-TABS) disintegrating tablet 3 mg  3 mg Oral BID Gwen Edler      . traZODone (DESYREL) tablet 50 mg  50 mg Oral QHS PRN,MR X 1 Evanna Cori Merry Proud, NP   50 mg at 06/11/13 2316    Observation Level/Precautions:  routine  Laboratory:  routine  Psychotherapy:    Medications:    Consultations:    Discharge Concerns:    Estimated LOS: 7-10  Other:     I certify that inpatient services furnished can reasonably be expected to improve the patient's condition.   Thedore Mins, MD 9/16/201410:52 AM

## 2013-06-12 NOTE — [facility] Group Notes (Signed)
[facility] LCSW Group Therapy  06/12/2013 , 4:36 PM   Type of Therapy:  Group Therapy  Participation Level:  Active  Participation Quality:  Attentive  Affect:  Appropriate  Cognitive:  Alert  Insight:  Improving  Engagement in Therapy:  Engaged  Modes of Intervention:  Discussion, Exploration and Socialization  Summary of Progress/Problems: Today's group focused on the term Diagnosis.  Participants were asked to define the term, and then pronounce whether it is a negative, positive or neutral term.  Did not attend  Ida Rogue 06/12/2013 , 4:36 PM

## 2013-06-13 MED ORDER — OXCARBAZEPINE 300 MG PO TABS
600.0000 mg | ORAL_TABLET | Freq: Two times a day (BID) | ORAL | Status: DC
  Administered 2013-06-13 – 2013-06-19 (×10): 600 mg via ORAL
  Filled 2013-06-13: qty 2
  Filled 2013-06-13: qty 12
  Filled 2013-06-13 (×11): qty 2
  Filled 2013-06-13: qty 12
  Filled 2013-06-13 (×5): qty 2

## 2013-06-13 MED ORDER — NICOTINE 21 MG/24HR TD PT24
21.0000 mg | MEDICATED_PATCH | Freq: Every day | TRANSDERMAL | Status: DC
  Administered 2013-06-13: 21 mg via TRANSDERMAL
  Filled 2013-06-13 (×9): qty 1

## 2013-06-13 NOTE — Progress Notes (Signed)
Patient ID: [name], male   DOB: [birth year], 37 y.o.   MRN: [id number] D:Patient extremely labile this morning.  Patient took his meds this morning and afterward clenched his fists at nurse.  Patient in the hall intimidating peers.  Observed in day room laughing out loud inappropriately.  He was also yelling out racial slurs stating, "If they are black, I'm black too!"  Patient using abusive language and yelling out "nigger" several times.  When asked to stop using racial and abusive language, he became even louder.  Admin. 15 mg zypexa po.  Patient then starting flirting inappropriate with another staff member in the day room.  Patient did go to cafeteria this am with incident.  A:Continue to monitor behavior and redirect as necessary.  Safety checks completed every 15 minutes per protocol.  R:Patient's behavior volatile, impulsive and extremely labile.

## 2013-06-13 NOTE — Progress Notes (Signed)
The focus of this group is to help patients review their daily goal of treatment and discuss progress on daily workbooks. Pt attended the evening group and responded to discussion prompts from the Writer. Pt slept through the first fifteen minutes of group, but was willing to participate upon waking. Pt reported having a good day on the unit. "I woke up smelling roses and happier than a duck." Pt shared that he hopes his aftercare will go more smoothly upon discharge than his previous admission and that he will remember to take his medications. Pt also shared: "I think my problem is that I just enjoy fighting too much." Pt's affect was appropriate and the Writer observed him smiling and laughing.

## 2013-06-13 NOTE — Progress Notes (Signed)
[facility] MD Progress Note  06/13/2013 9:48 AM [name]  MRN:  [id number] Subjective:"I need my payee to give me my money." Objective: Patient has no insight into her problem and making poor judgment. He has been laughing, yelling and screaming inappropriately on the unit. He is disruptive, easily agitated, volatile, impulsive and extremely labile. He is demanding, delusional, psychotic, rude, threatening, defiant and oppositional. Patient is partially compliant with his medications. Diagnosis:   DSM5: Schizophrenia Disorders:  Delusional Disorder (297.1) and Schizophrenia (295.7) Obsessive-Compulsive Disorders:  denies Trauma-Stressor Disorders:  unknown Substance/Addictive Disorders:  Cocaine use disorder Depressive Disorders:  Disruptive Mood Dysregulation Disorder (296.99)  Axis I: Schizoaffective-bipolar type  ADL's:  Intact  Sleep: Fair  Appetite:  Fair  Suicidal Ideation: denies  Homicidal Ideation: denies   AEB (as evidenced by):  Psychiatric Specialty Exam: Review of Systems  Constitutional: Negative.   HENT: Negative.   Eyes: Negative.   Respiratory: Negative.   Cardiovascular: Negative.   Gastrointestinal: Negative.   Genitourinary: Negative.   Musculoskeletal: Positive for joint pain.  Skin: Negative.   Neurological: Negative.   Endo/Heme/Allergies: Negative.   Psychiatric/Behavioral: Positive for hallucinations. The patient has insomnia.     Blood pressure 143/87, pulse 107, temperature 97.3 F (36.3 C), temperature source Oral, resp. rate 20, height 5\' 10"  (1.778 m), weight 94.348 kg (208 lb).Body mass index is 29.84 kg/(m^2).  General Appearance: Disheveled  Eye Contact::  Minimal  Speech:  Pressured and loud  Volume:  Increased  Mood:  Angry and Irritable  Affect:  Labile and Full Range  Thought Process:  Disorganized  Orientation: only to Place, and Person  Thought Content:  Delusions and Hallucinations: Auditory  Suicidal Thoughts:  No  Homicidal  Thoughts:  No  Memory:  Immediate;   Fair Recent;   Fair Remote;   Poor  Judgement:  Poor  Insight:  Lacking  Psychomotor Activity:  Increased  Concentration:  Poor  Recall:  Poor  Akathisia:  No  Handed:  Right  AIMS (if indicated):     Assets:  Financial Resources/Insurance  Sleep:  Number of Hours: 6.5   Current Medications: Current Facility-Administered Medications  Medication Dose Route Frequency Provider Last Rate Last Dose  . alum & mag hydroxide-simeth (MAALOX/MYLANTA) 200-200-20 MG/5ML suspension 30 mL  30 mL Oral Q4H PRN Evanna Janann August, NP      . benztropine (COGENTIN) tablet 1 mg  1 mg Oral BID Keygan Dumond   1 mg at 06/13/13 0753  . hydrOXYzine (ATARAX/VISTARIL) tablet 25 mg  25 mg Oral Q4H PRN Tomiko Schoon   25 mg at 06/13/13 0411  . ibuprofen (ADVIL,MOTRIN) tablet 600 mg  600 mg Oral Q6H PRN Janny Crute   600 mg at 06/13/13 0411  . magnesium hydroxide (MILK OF MAGNESIA) suspension 30 mL  30 mL Oral Daily PRN Evanna Janann August, NP      . OLANZapine zydis (ZYPREXA) disintegrating tablet 15 mg  15 mg Oral Q8H PRN Raeley Gilmore   15 mg at 06/13/13 0800  . Oxcarbazepine (TRILEPTAL) tablet 300 mg  300 mg Oral BID Shakeem Stern   300 mg at 06/13/13 0754  . risperiDONE (RISPERDAL M-TABS) disintegrating tablet 2 mg  2 mg Oral BID Ansleigh Safer   2 mg at 06/13/13 0753  . traZODone (DESYREL) tablet 50 mg  50 mg Oral QHS PRN,MR X 1 Evanna Cori Merry Proud, NP   50 mg at 06/11/13 2316    Lab Results: No results found for this or any  previous visit (from the past 48 hour(s)).  Physical Findings: AIMS:  , ,  ,  ,    CIWA:    COWS:     Treatment Plan Summary: Daily contact with patient to assess and evaluate symptoms and progress in treatment Medication management  Plan:1. Admit for crisis management and stabilization. 2. Medication management to reduce current symptoms to base line and improve the     patient's overall level of functioning 3. Treat health  problems as indicated. 4. Develop treatment plan to decrease risk of relapse upon discharge and the need for     readmission. 5. Psycho-social education regarding relapse prevention and self care. 6. Health care follow up as needed for medical problems. 7. Restart home medications where appropriate. 8. Increase Trileptal to 600mg  po BID for aggression/mood lability 9.Continue Risperdal 2mg  po BID for delusion/psychosis.   Medical Decision Making Problem Points:  Established problem, worsening (2), Review of last therapy session (1) and Review of psycho-social stressors (1) Data Points:  Order Aims Assessment (2) Review of medication regiment & side effects (2) Review of new medications or change in dosage (2)  I certify that inpatient services furnished can reasonably be expected to improve the patient's condition.   Raven Harmes,MD 06/13/2013, 9:48 AM

## 2013-06-13 NOTE — [facility] Group Notes (Signed)
[facility] LCSW Aftercare Discharge Planning Group Note   06/13/2013 1:53 PM  Participation Quality:  Minimal  Mood/Affect:  **abrupt*  Depression Rating:  denies  Anxiety Rating:  denies  Thoughts of Suicide:  No Will you contract for safety?   NA  Current AVH:  Denies, but appears to be RIS  Plan for Discharge/Comments:  "I'm ready to go home.  I'm not suicidal.  I'm so glad to hear my money is safe."  Pt open to referral to ACT team, but "I ain't goin' to no group home."  Transportation Means: unknown  Supports:  Possibly ACT team  [place], Thereasa Distance B

## 2013-06-13 NOTE — Progress Notes (Signed)
Patient ID: [name], male   DOB: [birth year], 37 y.o.   MRN: [id number]  D: Pt appeared to be in a more pleasant mood than previous days. Approached the writer asking for "some medicine".  Stated that he was supposed to be getting something that started with an "R".  Writer looked at the mar and explained that the risperdal had been given at 0800 and 1700. Then pt stated that he needs to be on "pain meds".  Writer informed that Ibu was available if needed. Pt asked for the ibu as well as "something else".   A: Pt was given prn ibu and zyprexa. 15 min checks continued for safety.   R:  Pt remains safe.

## 2013-06-13 NOTE — Progress Notes (Signed)
Recreation Therapy Notes  Date: 09.17.2014 Time: 9:30am Location: [address] Dayroom  Group Topic: Leisure Education  Goal Area(s) Addresses:  Patient will verbalize activity of interest by end of group session. Patient will verbalize the ability to use positive leisure/recreation as a coping mechanism.  Behavioral Response: Hallucinating.   Intervention: Adapted Game  Activity: Leisure ABC's. LRT wrote alphabet on the white board. Patients were asked to identify leisure activities to correspond with each letter of the alphabet.   Education: Leisure Education, Building control surveyor, Coping Skills  Education Outcome: Acknowledges understanding  Clinical Observations/Feedback: Patient attended group, but had minimal interaction in group activity. Patient was observed to laugh hysterically at times, as well as strike up conversations with peers seated next to him about homophobia and milking cows. Patient left group at approximately 9:35am stating he needed to be in his room for a minute. Patient returned within 5 minutes, upon returning patient observed group members interaction in group activity and returned to laughing hysterically at times.   Marykay Lex Plato Alspaugh, LRT/CTRS  Maciej Schweitzer L 06/13/2013 12:22 PM

## 2013-06-13 NOTE — Progress Notes (Signed)
Adult Psychoeducational Group Note  Date:  06/13/2013 Time:  11:52 AM  Group Topic/Focus:  Personal Choices and Values:   The focus of this group is to help patients assess and explore the importance of values in their lives, how their values affect their decisions, how they express their values and what opposes their expression.  Participation Level:  Minimal  Participation Quality:  Inattentive and Redirectable  Affect:  Labile  Cognitive:  Alert  Insight: Improving  Engagement in Group:  Lacking  Modes of Intervention:  Discussion, Education, Limit-setting, Socialization and Support  Additional Comments:Tadashi attended group at times, patient would get up and walk out of group and then return back to group. Redirected at times, patient did share negative and positive values and how important they are to patient, following the handout in the workbook of Identifying values.   Karleen Hampshire Brittini 06/13/2013, 11:52 AM

## 2013-06-13 NOTE — [facility] Group Notes (Signed)
[facility] [employer] Group Therapy  06/13/2013 , 1:56 PM    Type of Therapy:  [employer] Presentation  Participation Level:  Active  Participation Quality:  Attentive  Affect:  Blunted  Cognitive:  Oriented  Insight:  Limited  Engagement in Therapy:  Engaged  Modes of Intervention:  Discussion, Education and Socialization  Summary of Progress/Problems:  [name] from [employer] came to present his recovery story and play the guitar.  Per usual, [name] went in and out a couple of times and blurted out periodically. For instance, "Hey, do you like pineapple upside down cake?"  Let the presenter know he enjoyed the guitar playing.  [name] 06/13/2013 , 1:56 PM

## 2013-06-14 NOTE — Progress Notes (Signed)
D: Pt continues to be preoccupied with living in a trailer or an apartment after discharge. Pt believes that he is independent enough to live in this type of living arrangement. However, pt knows that this is not a likely placement option for him after discharge. Overall this pt is reporting that he is ready to leave here. He denies having any current issues at this time. He is denying any SI/HI/AVH. Pt informed that there may be a "possibility" of him leaving tomorrow per report. Writer stressed out that this wasn't finalize as of now. Pt attended group this evening. Pt presents calm and cooperative this evening.  A: Continued support and availability as needed was extended to this pt. Staff continue to monitor pt with q20min checks.  R: Pt receptive to treatment. Pt remains safe at this time.

## 2013-06-14 NOTE — Progress Notes (Signed)
D:  Patient up and present in the milieu.  Continues to make inappropriate comments.  Came to me at one point and asked if we would get him an apartment or a trailer to live in.  When I told him that is not the type of services we provide he became angry and stated we are supposed to help people.  He met with a member of the ACT team today for an evaluation and was making comments to male that was interviewing him such as, "You could come see me every day.  I need your phone number."  She was able to refocus his line of thinking and keep him engaged in a discussion.  A:  Medications given as prescribed.  Advised patient that the position of the staff was to provide him with the tools necessary to survive after discharge.  I reminded him that we are able to set a person up in a group home, but we do not assist with finding an apartment or any other type of private housing.  Kept patient redirected when necessary.   R:  Interacting with peers, but inappropriate at times.  Has been redirectable today.  Safety is maintained with 15 minute checks.

## 2013-06-14 NOTE — [facility] Group Notes (Signed)
[facility] Group Notes:  (Counselor/Nursing/MHT/Case Management/Adjunct)  06/14/2013 1:15PM  Type of Therapy:  Group Therapy  Participation Level:  Minimal  Participation Quality:  Abrupt  Affect:  Flat  Cognitive:  Oriented  Insight:  Limited  Engagement in Group:  Limited  Engagement in Therapy:  Limited  Modes of Intervention:  Discussion, Exploration and Socialization  Summary of Progress/Problems: The topic for group was balance in life.  Pt participated in the discussion about when their life was in balance and out of balance and how this feels.  Pt discussed ways to get back in balance and short term goals they can work on to get where they want to be.  Najee blurted out "I'm tired of people picking on me. They kept on hitting me.  I'm tired of being retarded. "  Expressed that he meant he was tired of continuing to come back to the hospital.  Was not related to topic or group.  Meko says that he feels balanced since he is "stable."  Still fixated on house placement, "I'm going to be in an apartment or trailer."  Was reminded about ACT team support that will help him with house placement.   Demonstrated disorganized thoughts.  Also looks angrily at others with a scowl on his face, which can be quite disconcerting for some patients.   Ida Rogue 06/14/2013 12:56 PM

## 2013-06-14 NOTE — Progress Notes (Signed)
Patient ID: [name], male   DOB: 05/30/1976, 37 y.o.   MRN: [id number] [facility] MD Progress Note  06/14/2013 2:09 PM [name]  MRN:  [id number] Subjective:"Hey I need some pain medicine for my hand and a place to live after I leave. I called the number the social worker gave me but they just made fun of me so I hung up."  Objective: Patient has no insight into her problem and making poor judgment. He has been laughing, yelling and screaming inappropriately on the unit. He is disruptive, easily agitated, volatile, impulsive and extremely labile. He is demanding, delusional, psychotic,  threatening, defiant and oppositional. Patient  has been compliant with his medications over the last few days. The patient was able to stay calmer today when talking to writer but remains very labile on the unit.  Diagnosis:   DSM5: Schizophrenia Disorders:  Delusional Disorder (297.1) and Schizophrenia (295.7) Obsessive-Compulsive Disorders:  denies Trauma-Stressor Disorders:  unknown Substance/Addictive Disorders:  Cocaine use disorder Depressive Disorders:  Disruptive Mood Dysregulation Disorder (296.99)  Axis I: Schizoaffective-bipolar type  ADL's:  Intact  Sleep: Fair  Appetite:  Fair  Suicidal Ideation: denies  Homicidal Ideation: denies   AEB (as evidenced by):  Psychiatric Specialty Exam: Review of Systems  Constitutional: Negative.   HENT: Negative.   Eyes: Negative.   Respiratory: Negative.   Cardiovascular: Negative.   Gastrointestinal: Negative.   Genitourinary: Negative.   Musculoskeletal: Positive for joint pain.  Skin: Negative.   Neurological: Negative.   Endo/Heme/Allergies: Negative.   Psychiatric/Behavioral: Positive for hallucinations. The patient has insomnia.     Blood pressure 112/75, pulse 109, temperature 97.5 F (36.4 C), temperature source Oral, resp. rate 18, height 5\' 10"  (1.778 m), weight 94.348 kg (208 lb).Body mass index is 29.84 kg/(m^2).  General  Appearance: Disheveled  Eye Contact::  Minimal  Speech:  Pressured and loud  Volume:  Increased  Mood:  Angry and Irritable  Affect:  Labile and Full Range  Thought Process:  Disorganized  Orientation: only to Place, and Person  Thought Content:  Delusions and Hallucinations: Auditory  Suicidal Thoughts:  No  Homicidal Thoughts:  No  Memory:  Immediate;   Fair Recent;   Fair Remote;   Poor  Judgement:  Poor  Insight:  Lacking  Psychomotor Activity:  Increased  Concentration:  Poor  Recall:  Poor  Akathisia:  No  Handed:  Right  AIMS (if indicated):     Assets:  Financial Resources/Insurance  Sleep:  Number of Hours: 6.75   Current Medications: Current Facility-Administered Medications  Medication Dose Route Frequency Provider Last Rate Last Dose  . alum & mag hydroxide-simeth (MAALOX/MYLANTA) 200-200-20 MG/5ML suspension 30 mL  30 mL Oral Q4H PRN Evanna Janann August, NP      . benztropine (COGENTIN) tablet 1 mg  1 mg Oral BID Mojeed Akintayo   1 mg at 06/14/13 0751  . hydrOXYzine (ATARAX/VISTARIL) tablet 25 mg  25 mg Oral Q4H PRN Mojeed Akintayo   25 mg at 06/13/13 0411  . ibuprofen (ADVIL,MOTRIN) tablet 600 mg  600 mg Oral Q6H PRN Mojeed Akintayo   600 mg at 06/14/13 1107  . magnesium hydroxide (MILK OF MAGNESIA) suspension 30 mL  30 mL Oral Daily PRN Evanna Janann August, NP      . nicotine (NICODERM CQ - dosed in mg/24 hours) patch 21 mg  21 mg Transdermal Daily Mojeed Akintayo   21 mg at 06/13/13 1800  . OLANZapine zydis (ZYPREXA) disintegrating tablet 15  mg  15 mg Oral Q8H PRN Mojeed Akintayo   15 mg at 06/13/13 2002  . Oxcarbazepine (TRILEPTAL) tablet 600 mg  600 mg Oral BID Mojeed Akintayo   600 mg at 06/14/13 0751  . risperiDONE (RISPERDAL M-TABS) disintegrating tablet 2 mg  2 mg Oral BID Mojeed Akintayo   2 mg at 06/14/13 0751  . traZODone (DESYREL) tablet 50 mg  50 mg Oral QHS PRN,MR X 1 Evanna Cori Merry Proud, NP   50 mg at 06/11/13 2316    Lab Results: No results found  for this or any previous visit (from the past 48 hour(s)).  Physical Findings: AIMS: Facial and Oral Movements Muscles of Facial Expression: None, normal Lips and Perioral Area: None, normal Jaw: None, normal Tongue: None, normal,Extremity Movements Upper (arms, wrists, hands, fingers): None, normal Lower (legs, knees, ankles, toes): None, normal, Trunk Movements Neck, shoulders, hips: None, normal, Overall Severity Severity of abnormal movements (highest score from questions above): None, normal Incapacitation due to abnormal movements: None, normal Patient's awareness of abnormal movements (rate only patient's report): No Awareness,    CIWA:    COWS:     Treatment Plan Summary: Daily contact with patient to assess and evaluate symptoms and progress in treatment Medication management  Plan:1. Continue crisis management and stabilization. 2. Medication management to reduce current symptoms to base line and improve the patient's overall level of functioning 3. Treat health problems as indicated. 4. Develop treatment plan to decrease risk of relapse upon discharge and the need for readmission. Due to patients frequent admissions to [facility] treatment team is working to ensure the patient will be followed up by an ACT team.  5. Psycho-social education regarding relapse prevention and self care. 6. Health care follow up as needed for medical problems. 7. Restart home medications where appropriate. 8. Continue Trileptal to 600mg  po BID for aggression/mood lability 9.Continue Risperdal 2mg  po BID for delusion/psychosis.   Medical Decision Making Problem Points:  Established problem, worsening (2), Review of last therapy session (1) and Review of psycho-social stressors (1) Data Points:  Order Aims Assessment (2) Review of medication regiment & side effects (2) Review of new medications or change in dosage (2)  I certify that inpatient services furnished can reasonably be expected to  improve the patient's condition.   Fransisca Kaufmann, NP-C 06/14/2013, 2:09 PM

## 2013-06-14 NOTE — [facility] Suicide Risk Assessment (Signed)
[facility] INPATIENT:  Family/Significant Other Suicide Prevention Education  Suicide Prevention Education:  Education Completed; No one has been identified by the patient as the family member/significant other with whom the patient will be residing, and identified as the person(s) who will aid the patient in the event of a mental health crisis (suicidal ideations/suicide attempt).  With written consent from the patient, the family member/significant other has been provided the following suicide prevention education, prior to the and/or following the discharge of the patient.  The suicide prevention education provided includes the following:  Suicide risk factors  Suicide prevention and interventions  National Suicide Hotline telephone number  [facility] assessment telephone number  [facility] Emergency Assistance 911  [facility] and/or Residential Mobile Crisis Unit telephone number  Request made of family/significant other to:  Remove weapons (e.g., guns, rifles, knives), all items previously/currently identified as safety concern.    Remove drugs/medications (over-the-counter, prescriptions, illicit drugs), all items previously/currently identified as a safety concern.  The family member/significant other verbalizes understanding of the suicide prevention education information provided.  The family member/significant other agrees to remove the items of safety concern listed above.  The patient did not endorse SI at the time of admission, nor did the patient c/o SI during the stay here.  SPE not required.   [name] 06/14/2013, 5:14 PM

## 2013-06-14 NOTE — Progress Notes (Signed)
Adult Psychoeducational Group Note  Date:  06/14/2013 Time:  9:30AM Group Topic/Focus:  Leisure and Lifestyle Changes  Participation Level:  Active  Participation Quality:  Appropriate and Attentive  Affect:  Appropriate  Cognitive:  Alert and Appropriate  Insight: Appropriate  Engagement in Group:  Engaged  Modes of Intervention:  Discussion  Additional Comments:  Pt. Was attentive and appropriate during today's group discussion. Pt was able to complete self esteem handout that you had to list positive thing about yourself for each letter of the alphabet. The pt was able to come up with the following words Quick and Happy.  Bing Plume D 06/14/2013, 10:21 AM

## 2013-06-14 NOTE — Care Management Utilization Note (Signed)
   Per State Regulation 482.30  This chart was reviewed for necessity with respect to the patient's Admission/ Duration of stay.  Next review date:  06/18/13  Rasool Rommel Morrison RN, BSN 

## 2013-06-15 MED ORDER — RISPERIDONE 3 MG PO TABS
3.0000 mg | ORAL_TABLET | Freq: Every day | ORAL | Status: DC
  Administered 2013-06-15 – 2013-06-16 (×2): 3 mg via ORAL
  Filled 2013-06-15 (×4): qty 1

## 2013-06-15 MED ORDER — RISPERIDONE 2 MG PO TABS
2.0000 mg | ORAL_TABLET | Freq: Every day | ORAL | Status: DC
  Filled 2013-06-15 (×5): qty 1

## 2013-06-15 MED ORDER — RISPERIDONE 2 MG PO TBDP
2.0000 mg | ORAL_TABLET | Freq: Every day | ORAL | Status: DC
  Filled 2013-06-15: qty 1

## 2013-06-15 NOTE — Progress Notes (Signed)
D. Pt has been in room much of the evening, very minimal interaction or participation in milieu. Pt appears irritable this evening and speech is somewhat loud with disorganized thought process. Pt has received medications this evening without incident and has not verbalized any complaints. A. Support and encouragement provided, medication education provided. R. Pt verbalized understanding, safety maintained.

## 2013-06-15 NOTE — Progress Notes (Signed)
Pt did not attend wrap up group this evening.  

## 2013-06-15 NOTE — Progress Notes (Signed)
Seen and agreed. Audrea Bolte, MD 

## 2013-06-15 NOTE — [facility] Group Notes (Signed)
[facility] LCSW Aftercare Discharge Planning Group Note   06/15/2013 8:00 AM  Participation Quality:  Minimal   Mood/Affect:  Irritable      Depression Rating:    Anxiety Rating:    Thoughts of Suicide:  No Will you contract for safety?   N/A  Current AVH:  No  Plan for Discharge/Comments:  [name] stated "I'm tired of getting studied on and I need to get home."  Was ask to sign release form for an ACT team, [name] said "no" and immediately left group.  Was very anxious and irritable.    Transportation Means:   Supports: unknown   [place], [place] B

## 2013-06-15 NOTE — Progress Notes (Signed)
Patient ID: [name], male   DOB: December 16, 1975, 37 y.o.   MRN: [id number] [facility] MD Progress Note  06/15/2013  [name]  MRN:  [id number] Subjective: [name] has been sleeping this AM and is near to escalating due to his disappointment about not going home.   He is redirected and cooperative with this provider. States clearly he wants an ACT worker who will come to his home, and act as his payee. He also feels that the Zyprexa helps him more than anything.  He's not sure about the others. Objective: Patient's behavior has been labile, and intrusive. He needs extra time to calm himself down. He is redirectable with a firm but compassionate tone. Still continues to show no insight, poor judgement, and his behaviors are unpredictable. Diagnosis:   DSM5: Schizophrenia Disorders:  Delusional Disorder (297.1) and Schizophrenia (295.7) Obsessive-Compulsive Disorders:  denies Trauma-Stressor Disorders:  unknown Substance/Addictive Disorders:  Cocaine use disorder Depressive Disorders:  Disruptive Mood Dysregulation Disorder (296.99)  Axis I: Schizoaffective-bipolar type  ADL's:  Intact  Sleep: Fair  Appetite:  Fair  Suicidal Ideation: denies  Homicidal Ideation: denies   AEB (as evidenced by):  Psychiatric Specialty Exam: Review of Systems  Constitutional: Negative.   HENT: Negative.   Eyes: Negative.   Respiratory: Negative.   Cardiovascular: Negative.   Gastrointestinal: Negative.   Genitourinary: Negative.   Musculoskeletal: Positive for joint pain.  Skin: Negative.   Neurological: Negative.   Endo/Heme/Allergies: Negative.   Psychiatric/Behavioral: Positive for hallucinations. The patient has insomnia.     Blood pressure 112/75, pulse 109, temperature 97.5 F (36.4 C), temperature source Oral, resp. rate 18, height 5\' 10"  (1.778 m), weight 94.348 kg (208 lb).Body mass index is 29.84 kg/(m^2).  General Appearance: Disheveled  Eye Contact::  Minimal  Speech:  Pressured and  loud  Volume:  Increased  Mood:  Angry and Irritable  Affect:  Labile and Full Range  Thought Process:  Disorganized  Orientation: only to Place, and Person  Thought Content:  Delusions and Hallucinations: Auditory  Suicidal Thoughts:  No  Homicidal Thoughts:  No  Memory:  Immediate;   Fair Recent;   Fair Remote;   Poor  Judgement:  Poor  Insight:  Lacking  Psychomotor Activity:  Increased  Concentration:  Poor  Recall:  Poor  Akathisia:  No  Handed:  Right  AIMS (if indicated):     Assets:  Financial Resources/Insurance  Sleep:  Number of Hours: 6.75   Current Medications: Current Facility-Administered Medications  Medication Dose Route Frequency Provider Last Rate Last Dose  . alum & mag hydroxide-simeth (MAALOX/MYLANTA) 200-200-20 MG/5ML suspension 30 mL  30 mL Oral Q4H PRN Evanna Janann August, NP      . benztropine (COGENTIN) tablet 1 mg  1 mg Oral BID Mojeed Akintayo   1 mg at 06/14/13 0751  . hydrOXYzine (ATARAX/VISTARIL) tablet 25 mg  25 mg Oral Q4H PRN Mojeed Akintayo   25 mg at 06/13/13 0411  . ibuprofen (ADVIL,MOTRIN) tablet 600 mg  600 mg Oral Q6H PRN Mojeed Akintayo   600 mg at 06/14/13 1107  . magnesium hydroxide (MILK OF MAGNESIA) suspension 30 mL  30 mL Oral Daily PRN Evanna Janann August, NP      . nicotine (NICODERM CQ - dosed in mg/24 hours) patch 21 mg  21 mg Transdermal Daily Mojeed Akintayo   21 mg at 06/13/13 1800  . OLANZapine zydis (ZYPREXA) disintegrating tablet 15 mg  15 mg Oral Q8H PRN Mojeed Akintayo  15 mg at 06/13/13 2002  . Oxcarbazepine (TRILEPTAL) tablet 600 mg  600 mg Oral BID Mojeed Akintayo   600 mg at 06/14/13 0751  . risperiDONE (RISPERDAL M-TABS) disintegrating tablet 2 mg  2 mg Oral BID Mojeed Akintayo   2 mg at 06/14/13 0751  . traZODone (DESYREL) tablet 50 mg  50 mg Oral QHS PRN,MR X 1 Evanna Cori Merry Proud, NP   50 mg at 06/11/13 2316    Lab Results: No results found for this or any previous visit (from the past 48 hour(s)).  Physical  Findings: AIMS: Facial and Oral Movements Muscles of Facial Expression: None, normal Lips and Perioral Area: None, normal Jaw: None, normal Tongue: None, normal,Extremity Movements Upper (arms, wrists, hands, fingers): None, normal Lower (legs, knees, ankles, toes): None, normal, Trunk Movements Neck, shoulders, hips: None, normal, Overall Severity Severity of abnormal movements (highest score from questions above): None, normal Incapacitation due to abnormal movements: None, normal Patient's awareness of abnormal movements (rate only patient's report): No Awareness,    CIWA:    COWS:     Treatment Plan Summary: Daily contact with patient to assess and evaluate symptoms and progress in treatment Medication management  Plan: 1. Continue crisis management and stabilization. 2. Medication management to reduce current symptoms to base line and improve the patient's overall level of functioning 3. Treat health problems as indicated. 4. Develop treatment plan to decrease risk of relapse upon discharge and the need for readmission. Due to patients frequent admissions to [facility] treatment team is working to ensure the patient will be followed up by an ACT team.  5. Psycho-social education regarding relapse prevention and self care. 6. Health care follow up as needed for medical problems. 7. Restart home medications where appropriate. 8. Continue Trileptal to 600mg  po BID for aggression/mood lability 9. Increasing Risperdal 2mg  in AM and 3mg  at HS.for delusion/psychosis. 10. Consider adding a second antipsychotic and will also cover with a Benzo for agitation.   Medical Decision Making Problem Points:  Established problem, worsening (2), Review of last therapy session (1) and Review of psycho-social stressors (1) Data Points:  Order Aims Assessment (2) Review of medication regiment & side effects (2) Review of new medications or change in dosage (2)  I certify that inpatient services  furnished can reasonably be expected to improve the patient's condition.  Discussed using 2nd antipsychotic medication with Dr. Jannifer Franklin and will evaluate patient again tomorrow to initiate this. Rona Ravens. Jamiere Gulas RPAC 2:04 PM 06/15/2013

## 2013-06-15 NOTE — [facility] Group Notes (Signed)
[facility] LCSW Group Therapy  06/15/2013  1:05 PM  Type of Therapy:  Group therapy  Did not attend  Summary of Progress/Problems:  Chaplain was here to lead a group on themes of hope and courage.  [name] 06/15/2013 2:07 PM

## 2013-06-15 NOTE — Progress Notes (Signed)
Recreation Therapy Notes  Date: 09.19.2014 Time: 9:30am Location: [address] Dayroom  Group Topic: Leisure Education  Goal Area(s) Addresses:  Patient will verbalize activity of interest by end of group session. Patient will verbalize the ability to use positive leisure/recreation as a coping mechanism.  Behavioral Response: Irritable  Intervention: Air traffic controller  Activity: Leisure Skills Checklist. Patients were asked to take a survey of their leisure lifestyle. Using the survey patients were able to identify how they view leisure and what type of leisure they prefer.   Education:  Leisure Education, Building control surveyor  Education Outcome: Acknowledges understanding  Clinical Observations/Feedback: Patient attended group, but did not participate in group session. Patient accepted worksheet from LRT, but did not complete it. Patient appeared agitated during group, as he mumbled under his breath repeatedly and presented with angry affect.   Marykay Lex Burgandy Hackworth, LRT/CTRS  Jearl Klinefelter 06/15/2013 12:36 PM

## 2013-06-15 NOTE — Tx Team (Signed)
  Interdisciplinary Treatment Plan Update   Date Reviewed:  06/15/2013  Time Reviewed:  8:00 AM  Progress in Treatment:   Attending groups: Yes Participating in groups: Sporadically Taking medication as prescribed: Yes  Tolerating medication: Yes Family/Significant other contact made: Yes  Patient understands diagnosis: Yes  Discussing patient identified problems/goals with staff: Yes Medical problems stabilized or resolved: Yes Denies suicidal/homicidal ideation: Yes  In tx team Patient has not harmed self or others: Yes  For review of initial/current patient goals, please see plan of care.  Estimated Length of Stay:  4-5 days  Reason for Continuation of Hospitalization: Hallucinations Medication stabilization Other; describe Mood instability-irritability  New Problems/Goals identified:  N/A  Discharge Plan or Barriers:   leave with payee to get a place to stay  Additional Comments:  El continues to Need to make more progress with both psychosis and mood stability for him to have a chance to successfully stay out of the hospital for any significant period of time.  He is currently refusing to sign a release for the ACT team and allow them to visit him here.  He will not be d/ced until this happens.  Attendees:  Signature: Thedore Mins, MD 06/15/2013 8:00 AM   Signature: Richelle Ito, LCSW 06/15/2013 8:00 AM  Signature: Fransisca Kaufmann, NP 06/15/2013 8:00 AM  Signature: Joslyn Devon, RN 06/15/2013 8:00 AM  Signature: Liborio Nixon, RN 06/15/2013 8:00 AM  Signature:  06/15/2013 8:00 AM  Signature:   06/15/2013 8:00 AM  Signature:    Signature:    Signature:    Signature:    Signature:    Signature:      Scribe for Treatment Team:   Richelle Ito, LCSW  06/15/2013 8:00 AM

## 2013-06-16 MED ORDER — OLANZAPINE 10 MG PO TABS
10.0000 mg | ORAL_TABLET | Freq: Once | ORAL | Status: AC
  Filled 2013-06-16: qty 1

## 2013-06-16 MED ORDER — DIPHENHYDRAMINE HCL 50 MG/ML IJ SOLN
50.0000 mg | Freq: Once | INTRAMUSCULAR | Status: AC
  Administered 2013-06-16: 50 mg via INTRAMUSCULAR
  Filled 2013-06-16 (×2): qty 1

## 2013-06-16 MED ORDER — OLANZAPINE 5 MG PO TABS
5.0000 mg | ORAL_TABLET | Freq: Two times a day (BID) | ORAL | Status: DC
  Filled 2013-06-16 (×2): qty 1

## 2013-06-16 MED ORDER — LORAZEPAM 2 MG/ML IJ SOLN
2.0000 mg | Freq: Once | INTRAMUSCULAR | Status: AC
  Administered 2013-06-16: 2 mg via INTRAMUSCULAR
  Filled 2013-06-16: qty 1

## 2013-06-16 MED ORDER — CLONAZEPAM 1 MG PO TBDP: 1.0000 mg | ORAL_TABLET | Freq: Three times a day (TID) | ORAL | Status: DC

## 2013-06-16 MED ORDER — BENZTROPINE MESYLATE 1 MG/ML IJ SOLN
1.0000 mg | Freq: Once | INTRAMUSCULAR | Status: DC
  Filled 2013-06-16: qty 1

## 2013-06-16 MED ORDER — OLANZAPINE 5 MG PO TBDP
ORAL_TABLET | ORAL | Status: AC
  Filled 2013-06-16: qty 1

## 2013-06-16 MED ORDER — OLANZAPINE 5 MG PO TBDP
ORAL_TABLET | ORAL | Status: AC
  Filled 2013-06-16: qty 3

## 2013-06-16 MED ORDER — CLONAZEPAM 1 MG PO TBDP: 2.0000 mg | ORAL_TABLET | Freq: Three times a day (TID) | ORAL | Status: DC

## 2013-06-16 MED ORDER — OLANZAPINE 10 MG PO TBDP
ORAL_TABLET | ORAL | Status: AC
  Administered 2013-06-16: 10 mg
  Filled 2013-06-16: qty 1

## 2013-06-16 MED ORDER — BENZTROPINE MESYLATE 1 MG PO TABS
1.0000 mg | ORAL_TABLET | Freq: Once | ORAL | Status: DC
  Filled 2013-06-16: qty 1

## 2013-06-16 NOTE — Progress Notes (Signed)
Patient ID: [name], male   DOB: 12/15/1975, 37 y.o.   MRN: [id number] [facility] MD Progress Note  06/16/2013  [name]  MRN:  [id number]  Subjective: Patient is labile and irritable today. He is hard to redirect. He has refused his morning medications and is intimidating to staff and other patients.  He paces in the hall, continues to be disorganized and unpredictable. He has been given zyprexa this morning as well as his Risperdal, he has had some sleep.  Diagnosis:   DSM5: Schizophrenia Disorders:  Delusional Disorder (297.1) and Schizophrenia (295.7) Obsessive-Compulsive Disorders:  denies Trauma-Stressor Disorders:  unknown Substance/Addictive Disorders:  Cocaine use disorder Depressive Disorders:   Disruptive Mood Dysregulation Disorder (296.99) Axis I: Schizoaffective-bipolar type  ADL's:  Intact  Sleep: Fair  Appetite:  Fair  Suicidal Ideation: denies  Homicidal Ideation: denies   AEB (as evidenced by):  Psychiatric Specialty Exam: Review of Systems  Constitutional: Negative.   HENT: Negative.   Eyes: Negative.   Respiratory: Negative.   Cardiovascular: Negative.   Gastrointestinal: Negative.   Genitourinary: Negative.   Musculoskeletal: Positive for joint pain.  Skin: Negative.   Neurological: Negative.   Endo/Heme/Allergies: Negative.   Psychiatric/Behavioral: Positive for hallucinations. The patient has insomnia.     Blood pressure 112/75, pulse 109, temperature 97.5 F (36.4 C), temperature source Oral, resp. rate 18, height 5\' 10"  (1.778 m), weight 94.348 kg (208 lb).Body mass index is 29.84 kg/(m^2).  General Appearance: Disheveled  Eye Contact::  Minimal  Speech:  Pressured and loud  Volume:  Increased  Mood:  Angry and Irritable  Affect:  Labile and Full Range  Thought Process:  Disorganized  Orientation: only to Place, and Person  Thought Content:  Delusions and Hallucinations: Auditory  Suicidal Thoughts:  No  Homicidal Thoughts:  No   Memory:  Immediate;   Fair Recent;   Fair Remote;   Poor  Judgement:  Poor  Insight:  Lacking  Psychomotor Activity:  Increased  Concentration:  Poor  Recall:  Poor  Akathisia:  No  Handed:  Right  AIMS (if indicated):     Assets:  Financial Resources/Insurance  Sleep:  Number of Hours: 6.75   Current Medications: Current Facility-Administered Medications  Medication Dose Route Frequency Provider Last Rate Last Dose  . alum & mag hydroxide-simeth (MAALOX/MYLANTA) 200-200-20 MG/5ML suspension 30 mL  30 mL Oral Q4H PRN Evanna Janann August, NP      . benztropine (COGENTIN) tablet 1 mg  1 mg Oral BID Mojeed Akintayo   1 mg at 06/14/13 0751  . hydrOXYzine (ATARAX/VISTARIL) tablet 25 mg  25 mg Oral Q4H PRN Mojeed Akintayo   25 mg at 06/13/13 0411  . ibuprofen (ADVIL,MOTRIN) tablet 600 mg  600 mg Oral Q6H PRN Mojeed Akintayo   600 mg at 06/14/13 1107  . magnesium hydroxide (MILK OF MAGNESIA) suspension 30 mL  30 mL Oral Daily PRN Evanna Janann August, NP      . nicotine (NICODERM CQ - dosed in mg/24 hours) patch 21 mg  21 mg Transdermal Daily Mojeed Akintayo   21 mg at 06/13/13 1800  . OLANZapine zydis (ZYPREXA) disintegrating tablet 15 mg  15 mg Oral Q8H PRN Mojeed Akintayo   15 mg at 06/13/13 2002  . Oxcarbazepine (TRILEPTAL) tablet 600 mg  600 mg Oral BID Mojeed Akintayo   600 mg at 06/14/13 0751  . risperiDONE (RISPERDAL M-TABS) disintegrating tablet 2 mg  2 mg Oral BID Mojeed Akintayo   2 mg at  06/14/13 0751  . traZODone (DESYREL) tablet 50 mg  50 mg Oral QHS PRN,MR X 1 Evanna Cori Merry Proud, NP   50 mg at 06/11/13 2316    Lab Results: No results found for this or any previous visit (from the past 48 hour(s)).  Physical Findings: AIMS: Facial and Oral Movements Muscles of Facial Expression: None, normal Lips and Perioral Area: None, normal Jaw: None, normal Tongue: None, normal,Extremity Movements Upper (arms, wrists, hands, fingers): None, normal Lower (legs, knees, ankles, toes):  None, normal, Trunk Movements Neck, shoulders, hips: None, normal, Overall Severity Severity of abnormal movements (highest score from questions above): None, normal Incapacitation due to abnormal movements: None, normal Patient's awareness of abnormal movements (rate only patient's report): No Awareness,    CIWA:    COWS:     Treatment Plan Summary: Daily contact with patient to assess and evaluate symptoms and progress in treatment Medication management  Plan: 1. Continue crisis management and stabilization. 2. Medication management to reduce current symptoms to base line and improve the patient's overall level of functioning 3. Treat health problems as indicated. 4. Develop treatment plan to decrease risk of relapse upon discharge and the need for readmission. Due to patients frequent admissions to [facility] treatment team is working to ensure the patient will be followed up by an ACT team.  5. Psycho-social education regarding relapse prevention and self care. 6. Health care follow up as needed for medical problems. 7. Restart home medications where appropriate. 8. Continue Trileptal to 600mg  po BID for aggression/mood lability 9. Continue Risperdal 2mg  in AM and 3mg  at HS.for delusion/psychosis. 10. Will continue to monitor for EPS, Akathesia, and/or dystonia.  Decision Making Problem Points:  Established problem, worsening (2), Review of last therapy session (1) and Review of psycho-social stressors (1) Data Points:  Order Aims Assessment (2) Review of medication regiment & side effects (2) Review of new medications or change in dosage (2)  I certify that inpatient services furnished can reasonably be expected to improve the patient's condition.   Rona Ravens. Mashburn RPAC 2:08 PM 06/16/2013  Reviewed the information documented and agree with the treatment plan.  Canuto Kingston,JANARDHAHA R. 06/16/2013 3:16 PM

## 2013-06-16 NOTE — Progress Notes (Signed)
[facility] Group Notes:  (Nursing/MHT/Case Management/Adjunct)  Date:  06/16/2013  Time:  2000  Type of Therapy:  Psychoeducational Skills  Participation Level:  Minimal  Participation Quality:  Drowsy  Affect:  Lethargic  Cognitive:  Disorganized  Insight:  Lacking  Engagement in Group:  Resistant  Modes of Intervention:  Education  Summary of Progress/Problems: The patient slept during group except when it was his turn to speak. He did however share with the group that he enjoyed the meals and that he felt more relaxed. His goal for tomorrow is to stay on his Zyprexa and to avoid receiving any additional shots.   Hazle Coca S 06/16/2013, 9:01 PM

## 2013-06-16 NOTE — Progress Notes (Signed)
Patient ID: MALACHI KINZLER, male   DOB: Feb 25, 1976, 37 y.o.   MRN: [id number] 1:1 Nursing Note: The patient is awake and continues to be confused and disoriented to time and place. She has a flat affect and speaks softly. Denied any pain or a/v hallucination, but appears as though she may be responding to internal stimuli. 1:1 maintained for safety. Will continue to monitor.

## 2013-06-16 NOTE — Progress Notes (Signed)
Patient ID: [name], male   DOB: [birth year], 36 y.o.   MRN: [id number] The patient is somewhat labile this evening. He sat quietly in group and was not disruptive. After his turn to speak, he closed his eyes and appeared to be sleeping. After group he eat a small snack and requested his HS medicines because he wanted to go to bed. Compliant with medication and retired to bed without incident. Will continue to monitor.

## 2013-06-16 NOTE — Progress Notes (Addendum)
Pt was found lying in his bed asleep with regular respirations,. He appears very dishelved and appears in need of bath. When he awakens nurse will encourage him to shower.Pt has been going to groups and to the cafeteria. At this time unable to assess if he is delusional as he is sound asleep. Will continue to monitor. Pt awakened and pacing in the halls cursing . Pt appears very aggressive and went in  The dayroom, and attempted to slam the door. Pt grab a gatorade drink out of nurses hand and then threw the paper medicine cup at the nurse. PA aware and pt was given 15 mg of zyprexa prn dose for extreme agitation-Pt is very labile and presently is having a snack. Pt is responding to internal stimuli and is talking about his evil twin.PA aware pt is starting to esculate. PA spoke with mD on call and pt was given 50mg  of benedryl IM by nurse Mardella Layman and 2mg  of ativan IM as well. Pt does appear a little calmer now and is requesitng food. Given a salad.

## 2013-06-16 NOTE — [facility] Group Notes (Signed)
[facility] Group Notes:  (Clinical Social Work)  06/16/2013  11:15-11:45AM  Summary of Progress/Problems:   The main focus of today's process group was for the patient to identify ways in which they have in the past sabotaged their own recovery and reasons they may have done this/what they received from doing it.  We then worked to identify a specific plan to avoid doing this when discharged from the hospital for this admission.  The patient expressed that he self-sabotages with substances.  He abruptly started to demonstrate irritability, used the "f---" word a few times despite admonitions from CSW and left group but then returned.  Later he started to leave again and CSW asked him to remain, urged him to remember he has contributions to make in group.  For the remainder of group he kept an eye on the clock and was excited when group was over; however, he had no further outbursts.  Type of Therapy:  Group Therapy - Process  Participation Level:  Active  Participation Quality:  Attentive, Redirectable and Resistant  Affect:  Flat and Irritable  Cognitive:  Disorganized  Insight:  Developing/Improving  Engagement in Therapy:  Developing/Improving  Modes of Intervention:  Clarification, Education, Exploration, Discussion  Ambrose Mantle, LCSW 06/16/2013, 12:27 PM

## 2013-06-16 NOTE — Progress Notes (Signed)
Patient ID: [name], male   DOB: [birth year], 37 y.o.   MRN: [id number] D. The patient is resting in bed with eyes closed. No distress noted. A. Assessed for safety. R. The patient remains safe. Will continue to monitor.

## 2013-06-16 NOTE — [facility] Group Notes (Signed)
[facility] Group Notes:  (Nursing/MHT/Case Management/Adjunct)  Date:  06/16/2013  Time:  1:21 PM  Type of Therapy:  Psychoeducational Skills  Participation Level:  Minimal  Participation Quality:  Inattentive and Resistant  Affect:  Irritable and Labile  Cognitive:  Hallucinating and Lacking  Insight:  Lacking and Limited  Engagement in Group:  Lacking, Off Topic and Resistant  Modes of Intervention:  Discussion, Education and Exploration  Summary of Progress/Problems: Pt was very disruptive during group, walking in and out several times. Patient did answer when called upon, but laughed inappropriately when others shared and then made delusional statements stating '' i made all of you . i created all of you '' . Patient was able to be redirected after several attempts.  Malva Limes 06/16/2013, 1:21 PM

## 2013-06-17 MED ORDER — ARIPIPRAZOLE 9.75 MG/1.3ML IM SOLN
9.7500 mg | Freq: Once | INTRAMUSCULAR | Status: AC
  Administered 2013-06-17: 9.75 mg via INTRAMUSCULAR
  Filled 2013-06-17: qty 1.3

## 2013-06-17 MED ORDER — LORAZEPAM 1 MG PO TABS
2.0000 mg | ORAL_TABLET | Freq: Three times a day (TID) | ORAL | Status: DC | PRN
  Administered 2013-06-17 – 2013-06-19 (×4): 2 mg via ORAL
  Filled 2013-06-17 (×6): qty 2

## 2013-06-17 MED ORDER — LORAZEPAM 1 MG PO TABS
2.0000 mg | ORAL_TABLET | Freq: Once | ORAL | Status: AC
  Administered 2013-06-17: 2 mg via ORAL
  Filled 2013-06-17: qty 2

## 2013-06-17 MED ORDER — OLANZAPINE 10 MG PO TBDP
10.0000 mg | ORAL_TABLET | Freq: Two times a day (BID) | ORAL | Status: DC
  Administered 2013-06-17 – 2013-06-18 (×2): 10 mg via ORAL
  Filled 2013-06-17 (×6): qty 1

## 2013-06-17 MED ORDER — DIPHENHYDRAMINE HCL 50 MG/ML IJ SOLN
50.0000 mg | Freq: Once | INTRAMUSCULAR | Status: DC
  Filled 2013-06-17: qty 1

## 2013-06-17 MED ORDER — LORAZEPAM 2 MG/ML IJ SOLN: 2.0000 mg | Freq: Once | INTRAMUSCULAR | Status: AC

## 2013-06-17 MED ORDER — DIPHENHYDRAMINE HCL 50 MG PO CAPS
50.0000 mg | ORAL_CAPSULE | Freq: Once | ORAL | Status: AC
  Administered 2013-06-17: 50 mg via ORAL
  Filled 2013-06-17 (×2): qty 1

## 2013-06-17 MED ORDER — LORAZEPAM 2 MG/ML IJ SOLN: 2.0000 mg | Freq: Three times a day (TID) | INTRAMUSCULAR | Status: DC | PRN

## 2013-06-17 MED ORDER — OLANZAPINE 10 MG PO TBDP
20.0000 mg | ORAL_TABLET | Freq: Every day | ORAL | Status: DC
  Administered 2013-06-17: 20 mg via ORAL
  Filled 2013-06-17 (×3): qty 2

## 2013-06-17 NOTE — Progress Notes (Signed)
Patient ID: [name], male   DOB: [birth year], 37 y.o.   MRN: [id number] [facility] MD Progress Note  06/17/2013  [name]  MRN:  [id number]  Subjective: Patient states he is "pleasant" today. Meets with this provider in a cooperative easy manner. He has refused to take any further Risperdal. He states that the Ativan has helped and he only wants to take zyprexa, and that he still wants a nurse to come to his house to give him his meds, and his right hand also hurts as he thinks he broke it 6-8 weeks ago. Objective: [name] is less agitated today than yesterday, but still quite labile and very unpredictable. He is cooperative and thanks me profusely when I discussed getting an Abilify injection to go along with his Zyprexa. He is very invested in having a nurse come to his home to give him his meds. He also has full range of motion with his right hand and there is no swelling.  Diagnosis:   DSM5: Schizophrenia Disorders:  Delusional Disorder (297.1) and Schizophrenia (295.7) Obsessive-Compulsive Disorders:  denies Trauma-Stressor Disorders:  unknown Substance/Addictive Disorders:  Cocaine use disorder Depressive Disorders:   Disruptive Mood Dysregulation Disorder (296.99) Axis I: Schizoaffective-bipolar type  ADL's:  Intact  Sleep: Fair  Appetite:  Fair  Suicidal Ideation: denies  Homicidal Ideation: denies   AEB (as evidenced by):  Psychiatric Specialty Exam: Review of Systems  Constitutional: Negative.   HENT: Negative.   Eyes: Negative.   Respiratory: Negative.   Cardiovascular: Negative.   Gastrointestinal: Negative.   Genitourinary: Negative.   Musculoskeletal: Positive for joint pain.  Skin: Negative.   Neurological: Negative.   Endo/Heme/Allergies: Negative.   Psychiatric/Behavioral: Positive for hallucinations. The patient has insomnia.     Blood pressure 112/75, pulse 109, temperature 97.5 F (36.4 C), temperature source Oral, resp. rate 18, height 5\' 10"  (1.778  m), weight 94.348 kg (208 lb).Body mass index is 29.84 kg/(m^2).  General Appearance: Disheveled  Eye Contact::  Minimal  Speech:  Pressured and loud  Volume:  Increased  Mood:  Angry and Irritable  Affect:  Labile and Full Range  Thought Process:  Disorganized  Orientation: only to Place, and Person  Thought Content:  Delusions and Hallucinations: Auditory  Suicidal Thoughts:  No  Homicidal Thoughts:  No  Memory:  Immediate;   Fair Recent;   Fair Remote;   Poor  Judgement:  Poor  Insight:  Lacking  Psychomotor Activity:  Increased  Concentration:  Poor  Recall:  Poor  Akathisia:  No  Handed:  Right  AIMS (if indicated):     Assets:  Financial Resources/Insurance  Sleep:  Number of Hours: 6.75   Current Medications: Current Facility-Administered Medications  Medication Dose Route Frequency Provider Last Rate Last Dose  . alum & mag hydroxide-simeth (MAALOX/MYLANTA) 200-200-20 MG/5ML suspension 30 mL  30 mL Oral Q4H PRN Evanna Janann August, NP      . benztropine (COGENTIN) tablet 1 mg  1 mg Oral BID Mojeed Akintayo   1 mg at 06/14/13 0751  . hydrOXYzine (ATARAX/VISTARIL) tablet 25 mg  25 mg Oral Q4H PRN Mojeed Akintayo   25 mg at 06/13/13 0411  . ibuprofen (ADVIL,MOTRIN) tablet 600 mg  600 mg Oral Q6H PRN Mojeed Akintayo   600 mg at 06/14/13 1107  . magnesium hydroxide (MILK OF MAGNESIA) suspension 30 mL  30 mL Oral Daily PRN Evanna Cori Burkett, NP      . nicotine (NICODERM CQ - dosed in mg/24 hours)  patch 21 mg  21 mg Transdermal Daily Mojeed Akintayo   21 mg at 06/13/13 1800  . OLANZapine zydis (ZYPREXA) disintegrating tablet 15 mg  15 mg Oral Q8H PRN Mojeed Akintayo   15 mg at 06/13/13 2002  . Oxcarbazepine (TRILEPTAL) tablet 600 mg  600 mg Oral BID Mojeed Akintayo   600 mg at 06/14/13 0751  . risperiDONE (RISPERDAL M-TABS) disintegrating tablet 2 mg  2 mg Oral BID Mojeed Akintayo   2 mg at 06/14/13 0751  . traZODone (DESYREL) tablet 50 mg  50 mg Oral QHS PRN,MR X 1 Evanna  Cori Merry Proud, NP   50 mg at 06/11/13 2316    Lab Results: No results found for this or any previous visit (from the past 48 hour(s)).  Physical Findings: AIMS: Facial and Oral Movements Muscles of Facial Expression: None, normal Lips and Perioral Area: None, normal Jaw: None, normal Tongue: None, normal,Extremity Movements Upper (arms, wrists, hands, fingers): None, normal Lower (legs, knees, ankles, toes): None, normal, Trunk Movements Neck, shoulders, hips: None, normal, Overall Severity Severity of abnormal movements (highest score from questions above): None, normal Incapacitation due to abnormal movements: None, normal Patient's awareness of abnormal movements (rate only patient's report): No Awareness,    CIWA:    COWS:     Treatment Plan Summary: Daily contact with patient to assess and evaluate symptoms and progress in treatment Medication management  Plan: 1.Consulted with Pharm D. Extensively regarding the use of two 2nd generation antipsychotics. She has recommended Abilify IR injection x 1, with Zyprexa at 10mg  po BID and 20mg  po at HS. 2. If there is no contraindication to using the Abilify will use this with a goal of switching to [facility] upon discharge.  Tyrin is very positive about this and understands that it is a monthly injection, and hopes it will necessitate having an ACT team nurse come to his home to administer his shots. 3. He agrees to take this medication regimen as written. 4. Will continue the Ativan 2 mg po/IM TID for agitation. 5. Hope to d/c Monday after IM Maintena. Decision Making Problem Points:  Established problem, worsening (2), Review of last therapy session (1) and Review of psycho-social stressors (1) Data Points:  Order Aims Assessment (2) Review of medication regiment & side effects (2) Review of new medications or change in dosage (2)  I certify that inpatient services furnished can reasonably be expected to improve the patient's  condition.  Rona Ravens. Mashburn RPAC 2:03 PM 06/17/2013  Reviewed the information documented and agree with the treatment plan.  Hesper Venturella,JANARDHAHA R. 06/17/2013 3:57 PM

## 2013-06-17 NOTE — Progress Notes (Signed)
The focus of this group is to help patients review their daily goal of treatment and discuss progress on daily workbooks. Pt attended the evening group session and responded to all discussion prompts from the Writer. Pt shared that he had a good day on the unit due to his feeling "peaceful" and more calm than in previous days. Pt reported his goal for the coming week as being able to discharge home and his plan for avoiding rehospitalization as "staying off drugs." Pt's affect was appropriate, though he interrupted several of his peers while speaking with off-topic comments.

## 2013-06-17 NOTE — Progress Notes (Signed)
D. Pt has been up this evening and did attend evening wrap-up group. Pt did talk about how he was feeling more peaceful than earlier in the day and also spoke about how the injection that he received earlier today helped to calm him down. Pt has taken all his medications this evening and spoke about how he was grateful that he was back on the zyprexa which is what helps him the most. Pt spoke about how he is hopeful to be discharged tomorrow. A. Support and encouragement provided, medication education provided. R. Pt verbalized understanding, will continue to monitor.

## 2013-06-17 NOTE — [facility] Group Notes (Signed)
[facility] Group Notes:  (Clinical Social Work)  06/17/2013   11:15am-12:00pm  Summary of Progress/Problems:  The main focus of today's process group was to listen to a variety of genres of music and to identify that different types of music provoke different responses.  The patient then was able to identify personally what was soothing for them, as well as energizing.  Handouts were used to record feelings evoked, as well as how patient can personally use this knowledge in sleep habits, with depression, and with other symptoms.  The patient expressed understanding of concepts, as well as knowledge of how each type of music affected them and how this can be used when they are at home as a tool in their recovery.  However, he was in and out of the room.  Type of Therapy:  Music Therapy   Participation Level:  Active  Participation Quality:  Inattentive, distracting  Affect:  Blunted  Cognitive:  Oriented  Insight:  Limited  Engagement in Therapy:  Limited  Modes of Intervention:   Activity, Exploration  Ambrose Mantle, LCSW 06/17/2013, 8:18 AM

## 2013-06-17 NOTE — Progress Notes (Signed)
Patient ID: [name], male   DOB: 07-03-1976, 36 y.o.   MRN: [id number] Patient remains labile and irritable.  Patient refusing to take his scheduled medications.  He was given 2 mg ativan and 50 mg benedryl po for agitation.  Patient attended group and got up and slammed the door.  Patient stated, "those meds ya'll gave me made me pass out."  Patient stated, "I'm going home tomorrow."  Patient took ativan and went to bed.  He is unwilling to participate in his treatment.  He denies SI/HI, however appears to be responding to internal stimuli.  Continue to monitor medication management and MD orders.  Safety checks completed every 15 minutes per protocol.  Patient needs redirection from staff.

## 2013-06-17 NOTE — [facility] Group Notes (Signed)
[facility] Group Notes:  (Nursing/MHT/Case Management/Adjunct)  Date:  06/17/2013  Time:  11:11 AM  Type of Therapy:  Psychoeducational Skills  Participation Level:  Minimal  Participation Quality:  Inattentive  Affect:  Irritable and Labile  Cognitive:  Disorganized  Insight:  Lacking  Engagement in Group:  None  Modes of Intervention:  Activity, Discussion, Education and Exploration  Summary of Progress/Problems: Pt during group got up and left multiple times, was inattentive and irritable when called on during discussion,.  Malva Limes 06/17/2013, 11:11 AM

## 2013-06-18 MED ORDER — OLANZAPINE 10 MG PO TBDP
20.0000 mg | ORAL_TABLET | Freq: Two times a day (BID) | ORAL | Status: DC
  Administered 2013-06-18 – 2013-06-19 (×2): 20 mg via ORAL
  Filled 2013-06-18 (×6): qty 2

## 2013-06-18 NOTE — Progress Notes (Signed)
Patient has ben sleeping since the beginning of this shift. Writer went to patient's room more than three times to check on patient. Patient resting quietly with eyes closed. Respirations even and unlabored. No distress noted. Q 15 minute check continues as ordered to maintain safety.

## 2013-06-18 NOTE — [facility] Group Notes (Signed)
[facility] LCSW Group Therapy  06/18/2013 1:15 pm  Type of Therapy: Process Group Therapy  Participation Level:  Minimal  Participation Quality:  Appropriate  Affect:  Flat  Cognitive:  Oriented  Insight:  Improving  Engagement in Group:  Limited  Engagement in Therapy:  Limited  Modes of Intervention:  Activity, Clarification, Education, Problem-solving and Support  Summary of Progress/Problems: Today's group addressed the issue of overcoming obstacles.  Patients were asked to identify their biggest obstacle post d/c that stands in the way of their on-going success, and then problem solve as to how to manage this.  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  He admitted that an ACT team would be helpful "because they will bring me my medication."  Went on to say he knows people don't hate him as much when he is on medication.  I reframed it by saying his mood is more under control, and he does not get as agitated and angry.  He left to interview with the ACT team.  Ida Rogue 06/18/2013   4:31 PM

## 2013-06-18 NOTE — Progress Notes (Signed)
Recreation Therapy Notes  Date: 09.22.2014 Time: 9:40am Location: [address] Dayroom  Group Topic: Leisure Education  Goal Area(s) Addresses:  Patient will verbalize activity of interest by end of group session. Patient will verbalize the ability to use positive leisure/recreation as a coping mechanism.  Behavioral Response: Appropriate   Intervention: Game  Activity: Leisure Charades. Patients were asked to select a leisure activity out of a container and act out the leisure activity for group to guess.   Education:  Leisure Programme researcher, broadcasting/film/video. Discharge Planning  Education Outcome: Needs additional education  Clinical Observations/Feedback: Patient walked in and out of group session. Patient made no statements while entering and exiting, patient stood at doorway and observed for a short period, but never engaged in activity.   Marykay Lex Taylorann Tkach, LRT/CTRS  Jearl Klinefelter 06/18/2013 12:44 PM

## 2013-06-18 NOTE — Progress Notes (Signed)
Patient ID: [name], male   DOB: Oct 19, 1975, 37 y.o.   MRN: [id number] [facility] MD Progress Note  06/18/2013  [name]  MRN:  [id number]  Subjective: "I want to go home, I need to get me a mobile home, I have got money saved for that." Objective: Vikram behavior remains unpredictable. He is easily agitated, labile and irritable. Patient's thought process remains disorganized and he has difficulty processing information. Patient was so acute over the  Weekend he required multiple interventions for his out of control behavior. Diagnosis:   DSM5: Schizophrenia Disorders:  Delusional Disorder (297.1) and Schizophrenia (295.7) Obsessive-Compulsive Disorders:  denies Trauma-Stressor Disorders:  unknown Substance/Addictive Disorders:  Cocaine use disorder Depressive Disorders:   Disruptive Mood Dysregulation Disorder (296.99) Axis I: Schizoaffective-bipolar type  ADL's:  Intact  Sleep: Fair  Appetite:  Fair  Suicidal Ideation: denies  Homicidal Ideation: denies   AEB (as evidenced by):  Psychiatric Specialty Exam: Review of Systems  Constitutional: Negative.   HENT: Negative.   Eyes: Negative.   Respiratory: Negative.   Cardiovascular: Negative.   Gastrointestinal: Negative.   Genitourinary: Negative.   Musculoskeletal: Positive for joint pain.  Skin: Negative.   Neurological: Negative.   Endo/Heme/Allergies: Negative.   Psychiatric/Behavioral: Positive for hallucinations. The patient has insomnia.     Blood pressure 112/75, pulse 109, temperature 97.5 F (36.4 C), temperature source Oral, resp. rate 18, height 5\' 10"  (1.778 m), weight 94.348 kg (208 lb).Body mass index is 29.84 kg/(m^2).  General Appearance: Disheveled  Eye Contact::  Minimal  Speech:  Pressured and loud  Volume:  Increased  Mood:  Angry and Irritable  Affect:  Labile and Full Range  Thought Process:  Disorganized  Orientation: only to Place, and Person  Thought Content:  Delusions and  Hallucinations: Auditory  Suicidal Thoughts:  No  Homicidal Thoughts:  No  Memory:  Immediate;   Fair Recent;   Fair Remote;   Poor  Judgement:  Poor  Insight:  Lacking  Psychomotor Activity:  Increased  Concentration:  Poor  Recall:  Poor  Akathisia:  No  Handed:  Right  AIMS (if indicated):     Assets:  Financial Resources/Insurance  Sleep:  Number of Hours: 6.75   Current Medications: Current Facility-Administered Medications  Medication Dose Route Frequency Provider Last Rate Last Dose  . alum & mag hydroxide-simeth (MAALOX/MYLANTA) 200-200-20 MG/5ML suspension 30 mL  30 mL Oral Q4H PRN Evanna Janann August, NP      . benztropine (COGENTIN) tablet 1 mg  1 mg Oral BID Tonye Tancredi   1 mg at 06/14/13 0751  . hydrOXYzine (ATARAX/VISTARIL) tablet 25 mg  25 mg Oral Q4H PRN Kenedi Cilia   25 mg at 06/13/13 0411  . ibuprofen (ADVIL,MOTRIN) tablet 600 mg  600 mg Oral Q6H PRN Chole Driver   600 mg at 06/14/13 1107  . magnesium hydroxide (MILK OF MAGNESIA) suspension 30 mL  30 mL Oral Daily PRN Evanna Janann August, NP      . nicotine (NICODERM CQ - dosed in mg/24 hours) patch 21 mg  21 mg Transdermal Daily Karlton Maya   21 mg at 06/13/13 1800  . OLANZapine zydis (ZYPREXA) disintegrating tablet 15 mg  15 mg Oral Q8H PRN Chetara Kropp   15 mg at 06/13/13 2002  . Oxcarbazepine (TRILEPTAL) tablet 600 mg  600 mg Oral BID Jovan Colligan   600 mg at 06/14/13 0751  . risperiDONE (RISPERDAL M-TABS) disintegrating tablet 2 mg  2 mg Oral BID Arber Wiemers  Shadeed Colberg   2 mg at 06/14/13 0751  . traZODone (DESYREL) tablet 50 mg  50 mg Oral QHS PRN,MR X 1 Evanna Cori Merry Proud, NP   50 mg at 06/11/13 2316    Lab Results: No results found for this or any previous visit (from the past 48 hour(s)).  Physical Findings: AIMS: Facial and Oral Movements Muscles of Facial Expression: None, normal Lips and Perioral Area: None, normal Jaw: None, normal Tongue: None, normal,Extremity Movements Upper  (arms, wrists, hands, fingers): None, normal Lower (legs, knees, ankles, toes): None, normal, Trunk Movements Neck, shoulders, hips: None, normal, Overall Severity Severity of abnormal movements (highest score from questions above): None, normal Incapacitation due to abnormal movements: None, normal Patient's awareness of abnormal movements (rate only patient's report): No Awareness,    CIWA:    COWS:     Treatment Plan Summary: Daily contact with patient to assess and evaluate symptoms and progress in treatment Medication management  Plan: 1. Increase Zyprexa Zydis to 20mg  po BID for mood lability/delusions 2. Continue other medication regimen. I 3. He agrees to take this medication regimen as written. 4. Will continue the Ativan 2 mg po/IM TID as needed  for agitation. 5. Case worker to arrange a meeting with patient and ACT team Decision Making Problem Points:  Established problem, worsening (2), Review of last therapy session (1) and Review of psycho-social stressors (1) Data Points:  Order Aims Assessment (2) Review of medication regiment & side effects (2) Review of new medications or change in dosage (2)  I certify that inpatient services furnished can reasonably be expected to improve the patient's condition.  Thedore Mins, MD 06/18/2013 10:24 AM

## 2013-06-18 NOTE — Progress Notes (Signed)
Patient ID: [name], male   DOB: 1976-09-01, 37 y.o.   MRN: [id number] D)  Has been resting quietly this evening, eyes closed, resp reg, snoring softly, no c/o's voiced. A)  Will continue to monitor for safety, continue POC R)  Safety maintained

## 2013-06-18 NOTE — Progress Notes (Signed)
Patient ID: [name], male   DOB: June 24, 1976, 37 y.o.   MRN: [id number] D:Patient agitated this am; he is requesting discharge today.  Patient restless with labile mood.  Patient argumentative with doctor stating, "I have to go outside; this place is making me crazy."  Patient is able to control his anger better today.  He was admin. 2 mg ativan for his anxiety and agitation.  Patient denies any SI/HI/AVH.  He wants to get in touch with his payee stating "I need her to set me up in a trailer.  I have several hundred dollars saved in the bank."   A:Continue to monitor medication management and MD orders.  Safety checks completed every 15 minutes per protocol. R:Monitor behavior and redirect as necessary.

## 2013-06-18 NOTE — Progress Notes (Signed)
Seen and agreed. Farzana Koci, MD 

## 2013-06-18 NOTE — BHH Group Notes (Signed)
[facility] LCSW Aftercare Discharge Planning Group Note   06/18/2013 4:29 PM  Participation Quality:  [name] come to group with only about 10 minutes left.  Stated he is ready to leave, and wants to know when that will happen.  Told him I need him to sign a release for ACT team so he can get services.  "Will I be discharged today if I sign?"  Told him it was not a guarantee.  He left, saying "I might as well stay here the rest of my life."  Later agreed to sign release, and I scheduled ACT team to come see him later today.    [name]

## 2013-06-19 MED ORDER — OXCARBAZEPINE 600 MG PO TABS: 600.0000 mg | ORAL_TABLET | Freq: Two times a day (BID) | ORAL | Status: DC

## 2013-06-19 MED ORDER — RISPERIDONE MICROSPHERES 25 MG IM SUSR: 25.0000 mg | INTRAMUSCULAR | Status: DC

## 2013-06-19 MED ORDER — OLANZAPINE 20 MG PO TBDP: 20.0000 mg | ORAL_TABLET | Freq: Every day | ORAL | Status: DC

## 2013-06-19 MED ORDER — OLANZAPINE 10 MG PO TBDP
20.0000 mg | ORAL_TABLET | Freq: Every day | ORAL | Status: DC
  Filled 2013-06-19: qty 6

## 2013-06-19 MED ORDER — BENZTROPINE MESYLATE 1 MG PO TABS: 1.0000 mg | ORAL_TABLET | Freq: Two times a day (BID) | ORAL | Status: DC

## 2013-06-19 MED ORDER — TRAZODONE HCL 50 MG PO TABS: 50.0000 mg | ORAL_TABLET | Freq: Every evening | ORAL | Status: DC | PRN

## 2013-06-19 NOTE — Discharge Summary (Signed)
Physician Discharge Summary Note  Patient:  [name] is an 37 y.o., male MRN:  [id number] DOB:  Apr 13, 1976 Patient phone:  [phone] (home)  Patient address:   [address] [place] [address]   Date of Admission:  06/11/2013 Date of Discharge: 06/19/13  Discharge Diagnoses: Principal Problem:   Schizoaffective disorder  Axis Diagnosis:  AXIS I: Schizoaffective disorder-Bipolar type  History of substance abuse  AXIS II: Deferred  AXIS III:  Past Medical History   Diagnosis  Date   .  Schizophrenic disorder    .  Boxer's fracture     AXIS IV: housing problems, other psychosocial or environmental problems and problems related to social environment  AXIS V: 61-70 mild symptoms  Level of Care:  OP  Hospital Course:   This is a 37 year old male who presented to the [facility] and admitted involuntarily due to homicidal thoughts towards his payee for messing with his money, delusional thinking, aggressive behavior, agitation, non-compliant with his medications and mood lability. The patient was recently discharged from [facility] in August of 2014. [name] was not compliant with his medications after leaving the hospital. He is a very poor historian during the interview and is able to forward little relevant information. His past admissions to [facility] were reviewed for most of the information. He is observed to have given the same story each time he came to the hospital. Patient states "I was hearing voices. I am God. I got very upset with my payee because I can't find her to give me some money."  Patient became extremely agitated during the interview, walked out of the room and slam the door very hard. Prior to that, he reports that he was not taking his medications, he denies suicidal ideations and drug use due to lack of money  While a patient in this hospital, [name] was enrolled in group counseling and activities as well as received the following medication Current  facility-administered medications:alum & mag hydroxide-simeth (MAALOX/MYLANTA) 200-200-20 MG/5ML suspension 30 mL, 30 mL, Oral, Q4H PRN, Audrea Muscat, NP;  benztropine (COGENTIN) tablet 1 mg, 1 mg, Oral, BID, Verne Spurr, PA-C, 1 mg at 06/19/13 0759;  benztropine (COGENTIN) tablet 1 mg, 1 mg, Oral, Once, [employer], PA-C;  benztropine mesylate (COGENTIN) injection 1 mg, 1 mg, Intramuscular, Once, [employer], PA-C diphenhydrAMINE (BENADRYL) injection 50 mg, 50 mg, Intramuscular, Once, [employer], PA-C;  ibuprofen (ADVIL,MOTRIN) tablet 600 mg, 600 mg, Oral, Q6H PRN, Mojeed Akintayo, 600 mg at 06/18/13 0946;  LORazepam (ATIVAN) injection 2 mg, 2 mg, Intramuscular, TID PRN, Verne Spurr, PA-C;  LORazepam (ATIVAN) tablet 2 mg, 2 mg, Oral, TID PRN, Verne Spurr, PA-C, 2 mg at 06/18/13 1703 magnesium hydroxide (MILK OF MAGNESIA) suspension 30 mL, 30 mL, Oral, Daily PRN, Evanna Cori Burkett, NP;  nicotine (NICODERM CQ - dosed in mg/24 hours) patch 21 mg, 21 mg, Transdermal, Daily, Mojeed Akintayo, 21 mg at 06/13/13 1800;  [START ON 06/20/2013] OLANZapine zydis (ZYPREXA) disintegrating tablet 20 mg, 20 mg, Oral, QHS, Mojeed Akintayo;  Oxcarbazepine (TRILEPTAL) tablet 600 mg, 600 mg, Oral, BID, Mojeed Akintayo, 600 mg at 06/19/13 0759 traZODone (DESYREL) tablet 50 mg, 50 mg, Oral, QHS PRN,MR X 1, Evanna Cori Burkett, NP, 50 mg at 06/14/13 2145 The patient's medications were managed by the MD. As on previous admissions to [facility] the patient's behavior was very labile and aggressive. Patient became upset daily when finding out that he would not be discharged on that  day. He was started on Trileptal which was increased to 600 mg BID. He was placed on Zyprexa Zydis which that patient had responded to before. However, the patient continued to have verbal outbursts. He is receiving Risperdal Consta injections prior to admission and the decision was made by treatment team that the patient needed two  antipsychotics to keep his mood stable. The patient continued to be very psychotic and delusional with difficult to manage behaviors. Patient became fixated on staff trying to find him a mobile home to live in. The patient has no insight into his mental illness as he told staff "I don't know why they keep bringing me here." The patient was observed laughing to himself at times on the hall and remained difficult to redirect at times. The patient appeared to have great difficulty processing information that was presented to him. Patient attended treatment team meeting this am and met with treatment team members. Pt symptoms, treatment plan and response to treatment discussed. [name] endorsed that their symptoms have improved. Pt also stated that they are stable for discharge.  In other to control Principal Problem:   Schizoaffective disorder , they will continue psychiatric care on outpatient basis. They will follow-up at  Follow-up Information   Follow up with [facility] On 06/20/2013. [name] will come see you tomorrow at your place.  Call him to check in if you have access to a phone)    Contact information:   [address]  [place]  [phone]    .  In addition they were instructed to take all your medications as prescribed by your mental healthcare provider, to report any adverse effects and or reactions from your medicines to your outpatient provider promptly, patient is instructed and cautioned to not engage in alcohol and or illegal drug use while on prescription medicines, in the event of worsening symptoms, patient is instructed to call the crisis hotline, 911 and or go to the nearest ED for appropriate evaluation and treatment of symptoms.   Upon discharge, patient adamantly denies suicidal, homicidal ideations, auditory, visual hallucinations and or delusional thinking. They left [facility] with all personal belongings in no apparent distress.  Consults:  See electronic  record for details  Significant Diagnostic Studies:  See electronic record for details  Discharge Vitals:   Blood pressure 134/89, pulse 111, temperature 97.5 F (36.4 C), temperature source Oral, resp. rate 20, height 5\' 10"  (1.778 m), weight 94.348 kg (208 lb)..  Mental Status Exam: See Mental Status Examination and Suicide Risk Assessment completed by Attending Physician prior to discharge.  Discharge destination:  Home  Is patient on multiple antipsychotic therapies at discharge:  Yes Has Patient had three or more failed trials of antipsychotic monotherapy by history: Yes Recommended Plan for Multiple Antipsychotic Therapies: Patient's dose of zyprexa was decreased to 20 mg hs prior to his discharge. Patient was agreeable to have an ACT team follow him after d/c to ensure close monitoring.     Medication List    STOP taking these medications       divalproex 500 MG 24 hr tablet  Commonly known as:  DEPAKOTE ER     HYDROcodone-acetaminophen 5-325 MG per tablet  Commonly known as:  NORCO/VICODIN     risperiDONE 2 MG disintegrating tablet  Commonly known as:  RISPERDAL M-TABS      TAKE these medications     Indication   benztropine 1 MG tablet  Commonly known as:  COGENTIN  Take 1 tablet (1 mg total) by mouth 2 (two) times daily.   Indication:  Extrapyramidal Reaction caused by Medications     OLANZapine zydis 20 MG disintegrating tablet  Commonly known as:  ZYPREXA  Take 1 tablet (20 mg total) by mouth at bedtime.  Start taking on:  06/20/2013   Indication:  Manic-Depression     oxcarbazepine 600 MG tablet  Commonly known as:  TRILEPTAL  Take 1 tablet (600 mg total) by mouth 2 (two) times daily.   Indication:  Manic-Depression     risperiDONE microspheres 25 MG injection  Commonly known as:  RISPERDAL CONSTA  Inject 2 mLs (25 mg total) into the muscle every 14 (fourteen) days. Patient received last dose on 06/16/13. He will receive the next injection in fourteen  days from last injection.   Indication:  Schizophrenia     traZODone 50 MG tablet  Commonly known as:  DESYREL  Take 1 tablet (50 mg total) by mouth at bedtime as needed and may repeat dose one time if needed for sleep.   Indication:  Trouble Sleeping           Follow-up Information   Follow up with [facility] On 06/20/2013. [name] will come see you tomorrow at your place.  Call him to check in if you have access to a phone)    Contact information:   [address]  [place]  [336] [phone]     Follow-up recommendations:   Activities: Resume typical activities Diet: Resume typical diet Tests: none Other: Follow up with outpatient provider and report any side effects to out patient prescriber.  Comments:  Take all your medications as prescribed by your mental healthcare provider. Report any adverse effects and or reactions from your medicines to your outpatient provider promptly. Patient is instructed and cautioned to not engage in alcohol and or illegal drug use while on prescription medicines. In the event of worsening symptoms, patient is instructed to call the crisis hotline, 911 and or go to the nearest ED for appropriate evaluation and treatment of symptoms. Follow-up with your primary care provider for your other medical issues, concerns and or health care needs.  SignedFransisca Kaufmann NP-C 06/19/2013 11:31 AM

## 2013-06-19 NOTE — Progress Notes (Signed)
Patient ID: [name], male   DOB: 04/04/1976, 37 y.o.   MRN: [id number] Patient discharged home per MD order.  Patient received all discharge instructions and follow ups.  Medications were reviewed with patient.  He received all his personal belongings and medication samples.  He also received prescriptions.  Patient denies SI/HI/AVH.

## 2013-06-19 NOTE — BHH Suicide Risk Assessment (Signed)
Suicide Risk Assessment  Discharge Assessment     Demographic Factors:  Male, Low socioeconomic status, Unemployed   Mental Status Per Nursing Assessment::   On Admission:     Current Mental Status by Physician: patient denies suicidal ideation, intent or plan  Loss Factors: Financial problems/change in socioeconomic status  Historical Factors: Impulsivity  Risk Reduction Factors:   Positive therapeutic relationship  Continued Clinical Symptoms:  Bipolar Disorder:   Mixed State Schizophrenia:   Paranoid or undifferentiated type  Cognitive Features That Contribute To Risk:  Closed-mindedness Polarized thinking    Suicide Risk:  Minimal: No identifiable suicidal ideation.  Patients presenting with no risk factors but with morbid ruminations; may be classified as minimal risk based on the severity of the depressive symptoms  Discharge Diagnoses:   AXIS I:  Schizoaffective disorder-Bipolar type              History of substance abuse  AXIS II:  Deferred AXIS III:   Past Medical History  Diagnosis Date  . Schizophrenic disorder   . Boxer's fracture    AXIS IV:  housing problems, other psychosocial or environmental problems and problems related to social environment AXIS V:  61-70 mild symptoms  Plan Of Care/Follow-up recommendations:  Activity:  as tolerated Diet:  healthy Tests:  routine Other:  Patient to keep his after care appointment  Is patient on multiple antipsychotic therapies at discharge:  Yes,   Do you recommend tapering to monotherapy for antipsychotics?  Yes   Has Patient had three or more failed trials of antipsychotic monotherapy by history:  No  Recommended Plan for Multiple Antipsychotic Therapies: Patient's medications are in the process of a cross-taper;  medications include:  Zyprexa and Risperdal  Thedore Mins, MD 06/19/2013, 10:30 AM

## 2013-06-19 NOTE — Progress Notes (Signed)
[facility] Adult Case Management Discharge Plan :  Will you be returning to the same living situation after discharge: No. At discharge, do you have transportation home?:Yes,  Ms Little  [phone] Do you have the ability to pay for your medications:Yes,  MCD  Release of information consent forms completed and in the chart;  Patient's signature needed at discharge.  Patient to Follow up at: Follow-up Information   Follow up with [facility] On 06/20/2013. [name] will come see you tomorrow at your place.  Call him to check in if you have access to a phone)    Contact information:   [address]  [place]  [phone]] [phone]      Patient denies SI/HI:   Yes,  yes    Safety Planning and Suicide Prevention discussed:  Yes,  yes  Ida Rogue 06/19/2013, 9:49 AM

## 2013-06-19 NOTE — Tx Team (Signed)
  Interdisciplinary Treatment Plan Update   Date Reviewed:  06/19/2013  Time Reviewed:  9:39 AM  Progress in Treatment:   Attending groups: Yes Participating in groups: Yes Taking medication as prescribed: Yes  Tolerating medication: Yes Family/Significant other contact made: Yes  Patient understands diagnosis: Yes  Discussing patient identified problems/goals with staff: Yes Medical problems stabilized or resolved: Yes Denies suicidal/homicidal ideation: Yes Patient has not harmed self or others: Yes  For review of initial/current patient goals, please see plan of care.  Estimated Length of Stay:  D/C today  Reason for Continuation of Hospitalization:   New Problems/Goals identified:  N/A  Discharge Plan or Barriers:   find a place to stay with the help of payee, follow up with [facility] ACT team  Additional Comments:  Attendees:  Signature: Thedore Mins, MD 06/19/2013 9:39 AM   Signature: Richelle Ito, LCSW 06/19/2013 9:39 AM  Signature: Fransisca Kaufmann, NP 06/19/2013 9:39 AM  Signature: Joslyn Devon, RN 06/19/2013 9:39 AM  Signature: Liborio Nixon, RN 06/19/2013 9:39 AM  Signature:  06/19/2013 9:39 AM  Signature:   06/19/2013 9:39 AM  Signature:    Signature:    Signature:    Signature:    Signature:    Signature:      Scribe for Treatment Team:   Richelle Ito, LCSW  06/19/2013 9:39 AM

## 2013-06-22 ENCOUNTER — Emergency Department (HOSPITAL_COMMUNITY)
Admission: EM | Admit: 2013-06-22 | Discharge: 2013-06-25 | Disposition: A | Discharge status: Home / Self Care | Payer: Medicare Other | Attending: Emergency Medicine | Admitting: Emergency Medicine

## 2013-06-22 ENCOUNTER — Encounter (HOSPITAL_COMMUNITY): Payer: Self-pay | Admitting: Emergency Medicine

## 2013-06-22 DIAGNOSIS — IMO0002 Reserved for concepts with insufficient information to code with codable children: Secondary | ICD-10-CM | POA: Diagnosis not present

## 2013-06-22 DIAGNOSIS — F259 Schizoaffective disorder, unspecified: Secondary | ICD-10-CM | POA: Diagnosis not present

## 2013-06-22 DIAGNOSIS — Z8781 Personal history of (healed) traumatic fracture: Secondary | ICD-10-CM | POA: Diagnosis not present

## 2013-06-22 DIAGNOSIS — F200 Paranoid schizophrenia: Secondary | ICD-10-CM | POA: Diagnosis not present

## 2013-06-22 DIAGNOSIS — Z79899 Other long term (current) drug therapy: Secondary | ICD-10-CM | POA: Diagnosis not present

## 2013-06-22 DIAGNOSIS — F1410 Cocaine abuse, uncomplicated: Secondary | ICD-10-CM

## 2013-06-22 DIAGNOSIS — S62319A Displaced fracture of base of unspecified metacarpal bone, initial encounter for closed fracture: Secondary | ICD-10-CM | POA: Diagnosis not present

## 2013-06-22 DIAGNOSIS — F411 Generalized anxiety disorder: Secondary | ICD-10-CM | POA: Insufficient documentation

## 2013-06-22 DIAGNOSIS — F17200 Nicotine dependence, unspecified, uncomplicated: Secondary | ICD-10-CM | POA: Diagnosis not present

## 2013-06-22 DIAGNOSIS — F22 Delusional disorders: Secondary | ICD-10-CM | POA: Diagnosis not present

## 2013-06-22 DIAGNOSIS — R443 Hallucinations, unspecified: Secondary | ICD-10-CM | POA: Insufficient documentation

## 2013-06-22 LAB — CBC WITH DIFFERENTIAL/PLATELET
Eosinophils Absolute: 0.4 10*3/uL (ref 0.0–0.7)
Hemoglobin: 15.9 g/dL (ref 13.0–17.0)
Lymphocytes Relative: 38 % (ref 12–46)
Lymphs Abs: 3.4 10*3/uL (ref 0.7–4.0)
MCH: 32 pg (ref 26.0–34.0)
MCV: 90.1 fL (ref 78.0–100.0)
Monocytes Relative: 7 % (ref 3–12)
Neutrophils Relative %: 49 % (ref 43–77)
RBC: 4.97 MIL/uL (ref 4.22–5.81)

## 2013-06-22 LAB — BASIC METABOLIC PANEL
CO2: 27 mEq/L (ref 19–32)
Calcium: 9.6 mg/dL (ref 8.4–10.5)
Creatinine, Ser: 0.92 mg/dL (ref 0.50–1.35)
GFR calc Af Amer: >90 mL/min (ref >90)

## 2013-06-22 LAB — RAPID URINE DRUG SCREEN, HOSP PERFORMED
Amphetamines: NOT DETECTED
Barbiturates: NOT DETECTED
Benzodiazepines: NOT DETECTED
Cocaine: POSITIVE — AB
Tetrahydrocannabinol: NOT DETECTED

## 2013-06-22 MED ORDER — OXCARBAZEPINE 300 MG PO TABS
600.0000 mg | ORAL_TABLET | Freq: Two times a day (BID) | ORAL | Status: DC
  Administered 2013-06-22 – 2013-06-25 (×8): 600 mg via ORAL
  Filled 2013-06-22 (×9): qty 2

## 2013-06-22 MED ORDER — OLANZAPINE 10 MG PO TBDP
20.0000 mg | ORAL_TABLET | Freq: Every day | ORAL | Status: DC
  Administered 2013-06-22 – 2013-06-24 (×3): 20 mg via ORAL
  Filled 2013-06-22 (×3): qty 2

## 2013-06-22 MED ORDER — RISPERIDONE MICROSPHERES 25 MG IM SUSR: 25.0000 mg | INTRAMUSCULAR | Status: DC

## 2013-06-22 MED ORDER — BENZTROPINE MESYLATE 1 MG PO TABS
1.0000 mg | ORAL_TABLET | Freq: Two times a day (BID) | ORAL | Status: DC
  Administered 2013-06-22 – 2013-06-25 (×8): 1 mg via ORAL
  Filled 2013-06-22 (×8): qty 1

## 2013-06-22 MED ORDER — TRAZODONE HCL 50 MG PO TABS
50.0000 mg | ORAL_TABLET | Freq: Every evening | ORAL | Status: DC | PRN
  Administered 2013-06-23 – 2013-06-24 (×2): 50 mg via ORAL
  Filled 2013-06-22 (×2): qty 1

## 2013-06-22 NOTE — ED Notes (Signed)
PT CHANGED IN TO [employer]. PT AND BELONGINGS SEARCHED AND WANDED BY SECURITY 

## 2013-06-22 NOTE — ED Notes (Signed)
Pt states he is paranoid, states he is under hypnosis and is under a spell for no f...Marland KitchenMarland KitchenMarland Kitchen Reason   Pt states he needs to be checked in   Pt was brought in by [employer]  Pt is here voluntarily  Pt talks of someone named Lollie Sails and another person named [name]

## 2013-06-22 NOTE — Progress Notes (Signed)
Patient ID: [name], male   DOB: 05-27-1976, 37 y.o.   MRN: [id number] ADDENDUM NOTE:  Spoke with Dr Lucianne Muss and got her permission to keep patient in our [facility] till seen by his A TEAM possibly on Monday. Dahlia Byes  PMHNP-BC

## 2013-06-22 NOTE — ED Provider Notes (Signed)
Patient care assumed from Earley Favor, FNP at shift change.  Patient is a paranoid schizophrenic. Labs significant for +cocaine. Patient medically cleared and pending TTS eval.  Patient signed out to Dr. Gerhard Munch at shift change. Dispo pending.  Antony Madura, PA-C 06/22/13 1550

## 2013-06-22 NOTE — Progress Notes (Signed)
Patient Discharge Instructions:  After Visit Summary (AVS):   Faxed to:  06/22/13 Discharge Summary Note:   Faxed to:  06/22/13 Psychiatric Admission Assessment Note:   Faxed to:  06/22/13 Suicide Risk Assessment - Discharge Assessment:   Faxed to:  06/22/13 Faxed/Sent to the Next Level Care provider:  06/22/13 Faxed to [facility] @ [phone]  Jerelene Redden, 06/22/2013, 2:00 PM
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
## 2013-06-22 NOTE — ED Notes (Signed)
Pt states he has been off his medication for the past few days

## 2013-06-22 NOTE — ED Provider Notes (Signed)
Medical screening examination/treatment/procedure(s) were performed by non-physician practitioner and as supervising physician I was immediately available for consultation/collaboration.   Celene Kras, MD 06/22/13 [phone]

## 2013-06-22 NOTE — Discharge Summary (Signed)
Seen and agreed. Niklas Chretien, MD 

## 2013-06-22 NOTE — Consult Note (Signed)
[facility] Face-to-Face Psychiatry Consult   Reason for Consult:  Picked up by police for paranoid hallucinations Referring Physician:  ER MD  [name] is an 37 y.o. male.  Assessment: AXIS I:  paranoid schizophrenia AXIS II:  Deferred AXIS III:   Past Medical History  Diagnosis Date  . Schizophrenic disorder   . Boxer's fracture    AXIS IV:  economic problems and housing problems AXIS V:  51-60 moderate symptoms  Plan:  No evidence of imminent risk to self or others at present.    Subjective:   [name] is a 37 y.o. male patient admitted with hallucinations.  HPI:  [name] was picked up by the police because he was talking to himself in the street.  He is the same as usual,paranoid and hallucinating.  He cannot go back to the motel where he stays.  The ACT team will meet him on Monday to help him with taking his meds and a place to stay. He says he needs their help and will be cooperative. HPI Elements:   Location:  ER. Quality:  psychotic. Severity:  moderate. Timing:  chronic. Duration:  years. Context:  homeless.  Past Psychiatric History: Past Medical History  Diagnosis Date  . Schizophrenic disorder   . Boxer's fracture     reports that he has been smoking Cigarettes.  He has a 15 pack-year smoking history. He has never used smokeless tobacco. He reports that  drinks alcohol. He reports that he does not use illicit drugs. History reviewed. No pertinent family history.         Allergies:   Allergies  Allergen Reactions  . Haldol [Haloperidol Decanoate] Other (See Comments)    'I curl up"    ACT Assessment Complete:  Yes:    Educational Status    Risk to Self: Risk to self Is patient at risk for suicide?: No Substance abuse history and/or treatment for substance abuse?: Yes  Risk to Others:    Abuse:    Prior Inpatient Therapy:    Prior Outpatient Therapy:    Additional Information:                    Objective: Blood pressure  133/85, pulse 92, temperature 97.8 F (36.6 C), temperature source Oral, resp. rate 16, SpO2 99.00%.There is no weight on file to calculate BMI. Results for orders placed during the hospital encounter of 06/22/13 (from the past 72 hour(s))  CBC WITH DIFFERENTIAL     Status: None   Collection Time    06/22/13  3:14 AM      Result Value Range   WBC 8.8  4.0 - 10.5 K/uL   RBC 4.97  4.22 - 5.81 MIL/uL   Hemoglobin 15.9  13.0 - 17.0 g/dL   HCT 16.1  09.6 - 04.5 %   MCV 90.1  78.0 - 100.0 fL   MCH 32.0  26.0 - 34.0 pg   MCHC 35.5  30.0 - 36.0 g/dL   RDW 40.9  81.1 - 91.4 %   Platelets 249  150 - 400 K/uL   Neutrophils Relative % 49  43 - 77 %   Neutro Abs 4.4  1.7 - 7.7 K/uL   Lymphocytes Relative 38  12 - 46 %   Lymphs Abs 3.4  0.7 - 4.0 K/uL   Monocytes Relative 7  3 - 12 %   Monocytes Absolute 0.6  0.1 - 1.0 K/uL   Eosinophils Relative 5  0 -  5 %   Eosinophils Absolute 0.4  0.0 - 0.7 K/uL   Basophils Relative 1  0 - 1 %   Basophils Absolute 0.1  0.0 - 0.1 K/uL  ETHANOL     Status: None   Collection Time    06/22/13  3:14 AM      Result Value Range   Alcohol, Ethyl (B) <11  0 - 11 mg/dL   Comment:            LOWEST DETECTABLE LIMIT FOR     SERUM ALCOHOL IS 11 mg/dL     FOR MEDICAL PURPOSES ONLY  BASIC METABOLIC PANEL     Status: Abnormal   Collection Time    06/22/13  3:14 AM      Result Value Range   Sodium 135  135 - 145 mEq/L   Potassium 3.6  3.5 - 5.1 mEq/L   Chloride 95 (*) 96 - 112 mEq/L   CO2 27  19 - 32 mEq/L   Glucose, Bld 93  70 - 99 mg/dL   BUN 11  6 - 23 mg/dL   Creatinine, Ser 1.61  0.50 - 1.35 mg/dL   Calcium 9.6  8.4 - 09.6 mg/dL   GFR calc non Af Amer >90  >90 mL/min   GFR calc Af Amer >90  >90 mL/min   Comment: (NOTE)     The eGFR has been calculated using the CKD EPI equation.     This calculation has not been validated in all clinical situations.     eGFR's persistently <90 mL/min signify possible Chronic Kidney     Disease.  URINE RAPID DRUG  SCREEN (HOSP PERFORMED)     Status: Abnormal   Collection Time    06/22/13  3:21 AM      Result Value Range   Opiates NONE DETECTED  NONE DETECTED   Cocaine POSITIVE (*) NONE DETECTED   Benzodiazepines NONE DETECTED  NONE DETECTED   Amphetamines NONE DETECTED  NONE DETECTED   Tetrahydrocannabinol NONE DETECTED  NONE DETECTED   Barbiturates NONE DETECTED  NONE DETECTED   Comment:            DRUG SCREEN FOR MEDICAL PURPOSES     ONLY.  IF CONFIRMATION IS NEEDED     FOR ANY PURPOSE, NOTIFY LAB     WITHIN 5 DAYS.                LOWEST DETECTABLE LIMITS     FOR URINE DRUG SCREEN     Drug Class       Cutoff (ng/mL)     Amphetamine      1000     Barbiturate      200     Benzodiazepine   200     Tricyclics       300     Opiates          300     Cocaine          300     THC              50   Labs are reviewed and are pertinent for no psychiatric issues.  Current Facility-Administered Medications  Medication Dose Route Frequency Provider Last Rate Last Dose  . benztropine (COGENTIN) tablet 1 mg  1 mg Oral BID Arman Filter, NP   1 mg at 06/22/13 1007  . OLANZapine zydis (ZYPREXA) disintegrating tablet 20 mg  20 mg Oral QHS Arman Filter,  NP      . Oxcarbazepine (TRILEPTAL) tablet 600 mg  600 mg Oral BID Arman Filter, NP   600 mg at 06/22/13 1007  . [START ON 06/30/2013] risperiDONE microspheres (RISPERDAL CONSTA) injection 25 mg  25 mg Intramuscular Q14 Days Arman Filter, NP      . traZODone (DESYREL) tablet 50 mg  50 mg Oral QHS PRN,Carl X 1 Arman Filter, NP       Current Outpatient Prescriptions  Medication Sig Dispense Refill  . benztropine (COGENTIN) 1 MG tablet Take 1 tablet (1 mg total) by mouth 2 (two) times daily.  60 tablet  0  . OLANZapine zydis (ZYPREXA) 20 MG disintegrating tablet Take 1 tablet (20 mg total) by mouth at bedtime.  30 tablet  0  . Oxcarbazepine (TRILEPTAL) 600 MG tablet Take 1 tablet (600 mg total) by mouth 2 (two) times daily.  60 tablet  0  . risperiDONE  microspheres (RISPERDAL CONSTA) 25 MG injection Inject 2 mLs (25 mg total) into the muscle every 14 (fourteen) days. Patient received last dose on 06/16/13. He will receive the next injection in fourteen days from last injection.  1 each  0  . traZODone (DESYREL) 50 MG tablet Take 1 tablet (50 mg total) by mouth at bedtime as needed and may repeat dose one time if needed for sleep.  60 tablet  0    Psychiatric Specialty Exam:     Blood pressure 133/85, pulse 92, temperature 97.8 F (36.6 C), temperature source Oral, resp. rate 16, SpO2 99.00%.There is no weight on file to calculate BMI.  General Appearance: Casual  Eye Contact::  Good  Speech:  Clear and Coherent and Normal Rate  Volume:  Normal  Mood:  Anxious  Affect:  Flat  Thought Process:  Coherent and Goal Directed  Orientation:  Full (Time, Place, and Person)  Thought Content:  Paranoid Ideation  Suicidal Thoughts:  No  Homicidal Thoughts:  No  Memory:  Immediate;   Good Recent;   Fair Remote;   Fair  Judgement:  Impaired  Insight:  Lacking  Psychomotor Activity:  Normal  Concentration:  Good  Recall:  Good  Akathisia:  Negative  Handed:  Right  AIMS (if indicated):     Assets:  Communication Skills Desire for Improvement  Sleep:      Treatment Plan Summary: Daily contact with patient to assess and evaluate symptoms and progress in treatment Medication management keep in the ER till Monday when he meets with his new ACT team.  T let him go might jeopardize the connection  Delani Kohli D 06/22/2013 12:33 PM

## 2013-06-22 NOTE — ED Notes (Signed)
Belongings in locker 33. 

## 2013-06-22 NOTE — ED Provider Notes (Signed)
CSN: [id number]     Arrival date & time 06/22/13  4098 History   First MD Initiated Contact with Patient 06/22/13 0250     Chief Complaint  Patient presents with  . Medical Clearance   (Consider location/radiation/quality/duration/timing/severity/associated sxs/prior Treatment) HPI Comments: Patient has a history of paranoid schizophrenia, brought in by police talking to people arguing with "Lollie Sails".  He was seen and discharged within the last month.  States she took the medicine for short period of time and he is better on his medications.  He doesn't know why he stops, but he feels, better, and then stop taking the medicine  The history is provided by the patient.    Past Medical History  Diagnosis Date  . Schizophrenic disorder   . Boxer's fracture    History reviewed. No pertinent past surgical history. History reviewed. No pertinent family history. History  Substance Use Topics  . Smoking status: Current Every Day Smoker -- 1.00 packs/day for 15 years    Types: Cigarettes  . Smokeless tobacco: Never Used  . Alcohol Use: Yes     Comment: seldom    Review of Systems  Constitutional: Negative for fever.  Neurological: Negative for dizziness and headaches.  Psychiatric/Behavioral: Positive for hallucinations and agitation.  All other systems reviewed and are negative.    Allergies  Haldol  Home Medications   Current Outpatient Rx  Name  Route  Sig  Dispense  Refill  . benztropine (COGENTIN) 1 MG tablet   Oral   Take 1 tablet (1 mg total) by mouth 2 (two) times daily.   60 tablet   0   . OLANZapine zydis (ZYPREXA) 20 MG disintegrating tablet   Oral   Take 1 tablet (20 mg total) by mouth at bedtime.   30 tablet   0   . Oxcarbazepine (TRILEPTAL) 600 MG tablet   Oral   Take 1 tablet (600 mg total) by mouth 2 (two) times daily.   60 tablet   0   . risperiDONE microspheres (RISPERDAL CONSTA) 25 MG injection   Intramuscular   Inject 2 mLs (25 mg total) into  the muscle every 14 (fourteen) days. Patient received last dose on 06/16/13. He will receive the next injection in fourteen days from last injection.   1 each   0     Next dose is due 06/16/2013   . traZODone (DESYREL) 50 MG tablet   Oral   Take 1 tablet (50 mg total) by mouth at bedtime as needed and may repeat dose one time if needed for sleep.   60 tablet   0    BP 119/95  Pulse 106  Temp(Src) 97.7 F (36.5 C) (Oral)  Resp 20  SpO2 96% Physical Exam  Nursing note and vitals reviewed. Constitutional: He appears well-developed and well-nourished.  HENT:  Head: Normocephalic.  Eyes: Pupils are equal, round, and reactive to light.  Cardiovascular: Normal rate.   Pulmonary/Chest: Effort normal.  Musculoskeletal: Normal range of motion.  Neurological: He is alert.  Psychiatric: His mood appears anxious. His affect is labile and inappropriate. His speech is rapid and/or pressured. He is agitated. Thought content is paranoid and delusional. Cognition and memory are impaired. He expresses impulsivity.    ED Course  Procedures (including critical care time) Labs Review Labs Reviewed  URINE RAPID DRUG SCREEN (HOSP PERFORMED) - Abnormal; Notable for the following:    Cocaine POSITIVE (*)    All other components within normal limits  BASIC METABOLIC PANEL -  Abnormal; Notable for the following:    Chloride 95 (*)    All other components within normal limits  CBC WITH DIFFERENTIAL  ETHANOL   Imaging Review No results found.  MDM  No diagnosis found.  Labs and urine, reviewed.  All normal except for positive cocaine    Arman Filter, NP 06/22/13 [phone]

## 2013-06-23 ENCOUNTER — Encounter (HOSPITAL_COMMUNITY): Payer: Self-pay | Admitting: Registered Nurse

## 2013-06-23 DIAGNOSIS — F200 Paranoid schizophrenia: Secondary | ICD-10-CM | POA: Diagnosis not present

## 2013-06-23 LAB — VALPROIC ACID LEVEL: Valproic Acid Lvl: <10 ug/mL — ABNORMAL LOW (ref 50.0–100.0)

## 2013-06-23 MED ORDER — DIVALPROEX SODIUM ER 500 MG PO TB24
500.0000 mg | ORAL_TABLET | Freq: Every day | ORAL | Status: DC
  Administered 2013-06-23 – 2013-06-24 (×2): 500 mg via ORAL
  Filled 2013-06-23 (×3): qty 1

## 2013-06-23 MED ORDER — DIVALPROEX SODIUM ER 250 MG PO TB24
250.0000 mg | ORAL_TABLET | Freq: Every morning | ORAL | Status: DC
  Administered 2013-06-23 – 2013-06-25 (×3): 250 mg via ORAL
  Filled 2013-06-23 (×3): qty 1

## 2013-06-23 MED ORDER — RISPERIDONE MICROSPHERES 25 MG IM SUSR
25.0000 mg | Freq: Once | INTRAMUSCULAR | Status: AC
  Administered 2013-06-23: 25 mg via INTRAMUSCULAR
  Filled 2013-06-23: qty 2

## 2013-06-23 NOTE — ED Notes (Signed)
Calmer, watching tv, laughing inappropriately. Snack given.

## 2013-06-23 NOTE — ED Notes (Signed)
Up to the bathroom 

## 2013-06-23 NOTE — ED Notes (Signed)
Pt up to the desk and reports he will allow his blood to be drawn

## 2013-06-23 NOTE — ED Notes (Signed)
Dr .Arfeen and Shuvon NP into see 

## 2013-06-23 NOTE — ED Provider Notes (Signed)
Medical screening examination/treatment/procedure(s) were performed by non-physician practitioner and as supervising physician I was immediately available for consultation/collaboration.  Hurman Horn, MD 06/23/13 [phone]

## 2013-06-23 NOTE — ED Notes (Signed)
Shuvon NP into talk w/ pt

## 2013-06-23 NOTE — ED Notes (Signed)
Pt declined blood draw

## 2013-06-23 NOTE — ED Notes (Signed)
Up tot he bathroom to shower and change scrubs 

## 2013-06-23 NOTE — B[facility] Counselor (Signed)
Shuvon NP believes ACTT program is with [employer].  TTS contacted [facility] and Marcelle Smiling reports they do have an ACTT but it only operates M-F and she has no way to access system or call someone.  TTS will follow up on Monday.

## 2013-06-23 NOTE — Consult Note (Signed)
[facility] Face-to-Face Psychiatry Consult Follow up  Reason for Consult:  Follow up consult evaluation patient picked up by police for paranoid hallucinations Referring Physician:  ER MD  [name] is an 37 y.o. male.  Assessment: AXIS I:  paranoid schizophrenia AXIS II:  Deferred AXIS III:   Past Medical History  Diagnosis Date  . Schizophrenic disorder   . Boxer's fracture    AXIS IV:  economic problems and housing problems AXIS V:  51-60 moderate symptoms  Plan:  No evidence of imminent risk to self or others at present.    Subjective:   [name] is a 37 y.o. male patient admitted with hallucinations.  HPI:  To day patient states that he wants to kill Whitney Post.  "He fucked up my life and messed up my head.  I'm going to kill that fucking security guard that came in my room; looking through my stuff.  I need somebody to find me some fucking place to live."  Patient states that he has not had a chance to meet his new ACT team.  Patient also states that he has not had any follow up with out patient services. Patient denies suicidal ideation, psychosis and paranoia.  Patient does endorse homicidal ideation.   Will increase patient medication.  Giving 25 mg of  Risperdal Consta IM today and to be increased to 37.5 mg at next scheduled dose and then to 50 mg at subsequent scheduled appointment.  Will also restart Depakote 250 Q morning and 500 mg QHS.   HPI Elements:   Location:  ER. Quality:  psychotic. Severity:  moderate. Timing:  chronic. Duration:  years. Context:  homeless.  Past Psychiatric History: Past Medical History  Diagnosis Date  . Schizophrenic disorder   . Boxer's fracture     reports that he has been smoking Cigarettes.  He has a 15 pack-year smoking history. He has never used smokeless tobacco. He reports that  drinks alcohol. He reports that he does not use illicit drugs. History reviewed. No pertinent family history. Family History Substance  Abuse: No Family Supports: No Living Arrangements: Other (Comment) Can pt return to current living arrangement?: Yes Abuse/Neglect [facility]) Physical Abuse: Denies Verbal Abuse: Denies Sexual Abuse: Denies Allergies:   Allergies  Allergen Reactions  . Haldol [Haloperidol Decanoate] Other (See Comments)    'I curl up"    ACT Assessment Complete:  Yes:    Educational Status    Risk to Self: Risk to self Suicidal Ideation: No-Not Currently/Within Last 6 Months Suicidal Intent: No-Not Currently/Within Last 6 Months Is patient at risk for suicide?: No Suicidal Plan?: No Specify Current Suicidal Plan: none Access to Means: No Specify Access to Suicidal Means: none What has been your use of drugs/alcohol within the last 12 months?: none noted Previous Attempts/Gestures: No How many times?: 0 Other Self Harm Risks: psychotic Triggers for Past Attempts: None known;Unpredictable Intentional Self Injurious Behavior: None Family Suicide History: No Recent stressful life event(s): Other (Comment) (none noted) Persecutory voices/beliefs?: Yes Depression: Yes Depression Symptoms: Isolating;Loss of interest in usual pleasures;Feeling angry/irritable Substance abuse history and/or treatment for substance abuse?: Yes Suicide prevention information given to non-admitted patients: Not applicable  Risk to Others: Risk to Others Homicidal Ideation: Yes-Currently Present Thoughts of Harm to Others: Yes-Currently Present Comment - Thoughts of Harm to Others: thoughts to kill parents Current Homicidal Intent: Yes-Currently Present Current Homicidal Plan: No-Not Currently/Within Last 6 Months Describe Current Homicidal Plan: none noted Access to Homicidal Means:  No Identified Victim: parents History of harm to others?: No Assessment of Violence: None Noted Violent Behavior Description: HI Does patient have access to weapons?: No Criminal Charges Pending?: No Does patient have a court date: No   Abuse: Abuse/Neglect Assessment (Assessment to be complete while patient is alone) Physical Abuse: Denies Verbal Abuse: Denies Sexual Abuse: Denies  Prior Inpatient Therapy: Prior Inpatient Therapy Prior Inpatient Therapy: Yes Prior Therapy Dates: 2014,2012,2011,2009,2008,2007,2006,2005,2004,2003 Prior Therapy Facilty/Provider(s): [facility] Reason for Treatment: Schizophrenia, SI/HI/SA  Prior Outpatient Therapy: Prior Outpatient Therapy Prior Outpatient Therapy: Yes Prior Therapy Dates: Current Prior Therapy Facilty/Provider(s): [facility] Reason for Treatment: ACTT  Additional Information: Additional Information 1:1 In Past 12 Months?: No CIRT Risk: No Elopement Risk: No Does patient have medical clearance?: Yes                  Objective: Blood pressure 95/60, pulse 72, temperature 97.9 F (36.6 C), temperature source Oral, resp. rate 18, SpO2 95.00%.There is no weight on file to calculate BMI. Results for orders placed during the hospital encounter of 06/22/13 (from the past 72 hour(s))  CBC WITH DIFFERENTIAL     Status: None   Collection Time    06/22/13  3:14 AM      Result Value Range   WBC 8.8  4.0 - 10.5 K/uL   RBC 4.97  4.22 - 5.81 MIL/uL   Hemoglobin 15.9  13.0 - 17.0 g/dL   HCT 16.1  09.6 - 04.5 %   MCV 90.1  78.0 - 100.0 fL   MCH 32.0  26.0 - 34.0 pg   MCHC 35.5  30.0 - 36.0 g/dL   RDW 40.9  81.1 - 91.4 %   Platelets 249  150 - 400 K/uL   Neutrophils Relative % 49  43 - 77 %   Neutro Abs 4.4  1.7 - 7.7 K/uL   Lymphocytes Relative 38  12 - 46 %   Lymphs Abs 3.4  0.7 - 4.0 K/uL   Monocytes Relative 7  3 - 12 %   Monocytes Absolute 0.6  0.1 - 1.0 K/uL   Eosinophils Relative 5  0 - 5 %   Eosinophils Absolute 0.4  0.0 - 0.7 K/uL   Basophils Relative 1  0 - 1 %   Basophils Absolute 0.1  0.0 - 0.1 K/uL  ETHANOL     Status: None   Collection Time    06/22/13  3:14 AM      Result Value Range   Alcohol, Ethyl (B) <11  0 - 11 mg/dL   Comment:             LOWEST DETECTABLE LIMIT FOR     SERUM ALCOHOL IS 11 mg/dL     FOR MEDICAL PURPOSES ONLY  BASIC METABOLIC PANEL     Status: Abnormal   Collection Time    06/22/13  3:14 AM      Result Value Range   Sodium 135  135 - 145 mEq/L   Potassium 3.6  3.5 - 5.1 mEq/L   Chloride 95 (*) 96 - 112 mEq/L   CO2 27  19 - 32 mEq/L   Glucose, Bld 93  70 - 99 mg/dL   BUN 11  6 - 23 mg/dL   Creatinine, Ser 7.82  0.50 - 1.35 mg/dL   Calcium 9.6  8.4 - 95.6 mg/dL   GFR calc non Af Amer >90  >90 mL/min   GFR calc Af Amer >90  >90 mL/min  Comment: (NOTE)     The eGFR has been calculated using the CKD EPI equation.     This calculation has not been validated in all clinical situations.     eGFR's persistently <90 mL/min signify possible Chronic Kidney     Disease.  URINE RAPID DRUG SCREEN (HOSP PERFORMED)     Status: Abnormal   Collection Time    06/22/13  3:21 AM      Result Value Range   Opiates NONE DETECTED  NONE DETECTED   Cocaine POSITIVE (*) NONE DETECTED   Benzodiazepines NONE DETECTED  NONE DETECTED   Amphetamines NONE DETECTED  NONE DETECTED   Tetrahydrocannabinol NONE DETECTED  NONE DETECTED   Barbiturates NONE DETECTED  NONE DETECTED   Comment:            DRUG SCREEN FOR MEDICAL PURPOSES     ONLY.  IF CONFIRMATION IS NEEDED     FOR ANY PURPOSE, NOTIFY LAB     WITHIN 5 DAYS.                LOWEST DETECTABLE LIMITS     FOR URINE DRUG SCREEN     Drug Class       Cutoff (ng/mL)     Amphetamine      1000     Barbiturate      200     Benzodiazepine   200     Tricyclics       300     Opiates          300     Cocaine          300     THC              50   Labs are reviewed and are pertinent for no psychiatric issues.  Current Facility-Administered Medications  Medication Dose Route Frequency Provider Last Rate Last Dose  . benztropine (COGENTIN) tablet 1 mg  1 mg Oral BID Arman Filter, NP   1 mg at 06/23/13 1017  . divalproex (DEPAKOTE ER) 24 hr tablet 250 mg  250 mg Oral  q morning - 10a Shuvon Rankin, NP      . divalproex (DEPAKOTE ER) 24 hr tablet 500 mg  500 mg Oral QHS Shuvon Rankin, NP      . OLANZapine zydis (ZYPREXA) disintegrating tablet 20 mg  20 mg Oral QHS Arman Filter, NP   20 mg at 06/22/13 2113  . Oxcarbazepine (TRILEPTAL) tablet 600 mg  600 mg Oral BID Arman Filter, NP   600 mg at 06/23/13 1017  . [START ON 06/30/2013] risperiDONE microspheres (RISPERDAL CONSTA) injection 25 mg  25 mg Intramuscular Q14 Days Arman Filter, NP      . risperiDONE microspheres (RISPERDAL CONSTA) injection 25 mg  25 mg Intramuscular Once Shuvon Rankin, NP      . traZODone (DESYREL) tablet 50 mg  50 mg Oral QHS PRN,MR X 1 Arman Filter, NP       Current Outpatient Prescriptions  Medication Sig Dispense Refill  . benztropine (COGENTIN) 1 MG tablet Take 1 tablet (1 mg total) by mouth 2 (two) times daily.  60 tablet  0  . OLANZapine zydis (ZYPREXA) 20 MG disintegrating tablet Take 1 tablet (20 mg total) by mouth at bedtime.  30 tablet  0  . Oxcarbazepine (TRILEPTAL) 600 MG tablet Take 1 tablet (600 mg total) by mouth 2 (two) times daily.  60 tablet  0  .  risperiDONE microspheres (RISPERDAL CONSTA) 25 MG injection Inject 2 mLs (25 mg total) into the muscle every 14 (fourteen) days. Patient received last dose on 06/16/13. He will receive the next injection in fourteen days from last injection.  1 each  0  . traZODone (DESYREL) 50 MG tablet Take 1 tablet (50 mg total) by mouth at bedtime as needed and may repeat dose one time if needed for sleep.  60 tablet  0    Psychiatric Specialty Exam:     Blood pressure 95/60, pulse 72, temperature 97.9 F (36.6 C), temperature source Oral, resp. rate 18, SpO2 95.00%.There is no weight on file to calculate BMI.  General Appearance: Casual  Eye Contact::  Good  Speech:  Clear and Coherent and Normal Rate  Volume:  Normal  Mood:  Anxious  Affect:  Flat  Thought Process:  Coherent and Goal Directed  Orientation:  Full (Time, Place,  and Person)  Thought Content:  Paranoid Ideation  Suicidal Thoughts:  No  Homicidal Thoughts:  Yes.  without intent/plan  Memory:  Immediate;   Good Recent;   Fair Remote;   Fair  Judgement:  Impaired  Insight:  Lacking  Psychomotor Activity:  Normal  Concentration:  Good  Recall:  Good  Akathisia:  Negative  Handed:  Right  AIMS (if indicated):     Assets:  Communication Skills Desire for Improvement  Sleep:      Face to face interview and consult with Dr. Lolly Mustache  Treatment Plan Summary: Daily contact with patient to assess and evaluate symptoms and progress in treatment Medication management keep in the ER till Monday when he meets with his new ACT team.  T let him go might jeopardize the connection  Will continue with current treatment plan.  Start above mentioned medication.  Continue to monitor patient for safety and stabilization.  Patient to be discharge Monday after meeting with ACT team and arrangement for follow up with primary provider.     Assunta Found, FNP-BC 06/23/2013 12:13 PM  I have personally seen the patient and agreed with the findings and involved in the treatment plan. Kathryne Sharper, MD

## 2013-06-23 NOTE — ED Notes (Signed)
Out of the shower and back to his room, began yelling and cursing.  Sitting on bed, watching tv, yelling about "harry"..the money....can't find the kids."

## 2013-06-23 NOTE — ED Notes (Signed)
Patient presents dishelved. Continues to respond to internal stimuli with inappropriate affect. Patient denies any pain. Denies suicidal or homicidal ideation.

## 2013-06-23 NOTE — ED Notes (Signed)
NP talking w/ pt

## 2013-06-23 NOTE — ED Notes (Signed)
Restless, watching tv at times, up in the hall walking.  Pt still w/ verbal outbursts yell and cursing at times. NP aware.

## 2013-06-24 ENCOUNTER — Emergency Department (HOSPITAL_COMMUNITY): Payer: Medicare Other

## 2013-06-24 DIAGNOSIS — F200 Paranoid schizophrenia: Secondary | ICD-10-CM | POA: Diagnosis not present

## 2013-06-24 DIAGNOSIS — S62319A Displaced fracture of base of unspecified metacarpal bone, initial encounter for closed fracture: Secondary | ICD-10-CM | POA: Diagnosis not present

## 2013-06-24 MED ORDER — ACETAMINOPHEN 500 MG PO TABS
1000.0000 mg | ORAL_TABLET | Freq: Four times a day (QID) | ORAL | Status: DC | PRN
  Administered 2013-06-24 – 2013-06-25 (×2): 1000 mg via ORAL
  Filled 2013-06-24 (×2): qty 2

## 2013-06-24 MED ORDER — ACETAMINOPHEN 325 MG PO TABS
650.0000 mg | ORAL_TABLET | Freq: Once | ORAL | Status: AC
  Administered 2013-06-24: 650 mg via ORAL
  Filled 2013-06-24: qty 2

## 2013-06-24 NOTE — ED Provider Notes (Signed)
3:07 PM informed by the nurse the patient punched a wall. Patient has an old boxer fracture in his right hand and now states his right hand hurts again. He took off the splint one week ago his he said he didn't want to look "wuss". Patient is neurovascular intact on my exam. He has swelling of his MCP near his middle finger. Per the nurse this is at baseline before he punched a wall. He has normal sensation and motor movements. We'll give Tylenol and get x-ray. Oncoming doctor will followup x-ray and treat if needed.  Audree Camel, MD 06/24/13 [phone]

## 2013-06-24 NOTE — ED Notes (Signed)
Pt unable to to use pain scale,ice and medication given-pt watching tv w/ his hand propped up on his injured hand.

## 2013-06-24 NOTE — Consult Note (Signed)
Psychiatry followup note   [name] is an 37 y.o. male.  Assessment: AXIS I:  paranoid schizophrenia AXIS II:  Deferred AXIS III:   Past Medical History  Diagnosis Date  . Schizophrenic disorder   . Boxer's fracture    AXIS IV:  economic problems and housing problems AXIS V:  51-60 moderate symptoms  Plan:  No evidence of imminent risk to self or others at present.    Subjective:   [name] is a 37 y.o. male patient admitted with hallucinations.  Patient was given Risperdal Consta 25 mg yesterday.  He had a good night sleep.  He continued to have paranoia and hallucination however they're less intense from the past.  He has homicidal thoughts yesterday but today he is much calmer.  He continues to be irritable easily agitated and angry.  We started him on Depakote and Zyprexa.  Patient will meet his ACT Team tomorrow for his discharge planning. Past Psychiatric History: Past Medical History  Diagnosis Date  . Schizophrenic disorder   . Boxer's fracture    Social history  reports that he has been smoking Cigarettes.  He has a 15 pack-year smoking history. He has never used smokeless tobacco. He reports that  drinks alcohol. He reports that he does not use illicit drugs. History reviewed. No pertinent family history. Family History Substance Abuse: No Family Supports: No Living Arrangements: Other (Comment) Can pt return to current living arrangement?: Yes Abuse/Neglect [facility]) Physical Abuse: Denies Verbal Abuse: Denies Sexual Abuse: Denies Allergies:   Allergies  Allergen Reactions  . Haldol [Haloperidol Decanoate] Other (See Comments)    'I curl up"    Objective: Blood pressure 111/72, pulse 63, temperature 97.6 F (36.4 C), temperature source Oral, resp. rate 16, SpO2 96.00%.There is no weight on file to calculate BMI. Results for orders placed during the hospital encounter of 06/22/13 (from the past 72 hour(s))  CBC WITH DIFFERENTIAL     Status: None   Collection Time    06/22/13  3:14 AM      Result Value Range   WBC 8.8  4.0 - 10.5 K/uL   RBC 4.97  4.22 - 5.81 MIL/uL   Hemoglobin 15.9  13.0 - 17.0 g/dL   HCT 16.1  09.6 - 04.5 %   MCV 90.1  78.0 - 100.0 fL   MCH 32.0  26.0 - 34.0 pg   MCHC 35.5  30.0 - 36.0 g/dL   RDW 40.9  81.1 - 91.4 %   Platelets 249  150 - 400 K/uL   Neutrophils Relative % 49  43 - 77 %   Neutro Abs 4.4  1.7 - 7.7 K/uL   Lymphocytes Relative 38  12 - 46 %   Lymphs Abs 3.4  0.7 - 4.0 K/uL   Monocytes Relative 7  3 - 12 %   Monocytes Absolute 0.6  0.1 - 1.0 K/uL   Eosinophils Relative 5  0 - 5 %   Eosinophils Absolute 0.4  0.0 - 0.7 K/uL   Basophils Relative 1  0 - 1 %   Basophils Absolute 0.1  0.0 - 0.1 K/uL  ETHANOL     Status: None   Collection Time    06/22/13  3:14 AM      Result Value Range   Alcohol, Ethyl (B) <11  0 - 11 mg/dL   Comment:            LOWEST DETECTABLE LIMIT FOR  SERUM ALCOHOL IS 11 mg/dL     FOR MEDICAL PURPOSES ONLY  BASIC METABOLIC PANEL     Status: Abnormal   Collection Time    06/22/13  3:14 AM      Result Value Range   Sodium 135  135 - 145 mEq/L   Potassium 3.6  3.5 - 5.1 mEq/L   Chloride 95 (*) 96 - 112 mEq/L   CO2 27  19 - 32 mEq/L   Glucose, Bld 93  70 - 99 mg/dL   BUN 11  6 - 23 mg/dL   Creatinine, Ser 1.61  0.50 - 1.35 mg/dL   Calcium 9.6  8.4 - 09.6 mg/dL   GFR calc non Af Amer >90  >90 mL/min   GFR calc Af Amer >90  >90 mL/min   Comment: (NOTE)     The eGFR has been calculated using the CKD EPI equation.     This calculation has not been validated in all clinical situations.     eGFR's persistently <90 mL/min signify possible Chronic Kidney     Disease.  URINE RAPID DRUG SCREEN (HOSP PERFORMED)     Status: Abnormal   Collection Time    06/22/13  3:21 AM      Result Value Range   Opiates NONE DETECTED  NONE DETECTED   Cocaine POSITIVE (*) NONE DETECTED   Benzodiazepines NONE DETECTED  NONE DETECTED   Amphetamines NONE DETECTED  NONE DETECTED    Tetrahydrocannabinol NONE DETECTED  NONE DETECTED   Barbiturates NONE DETECTED  NONE DETECTED   Comment:            DRUG SCREEN FOR MEDICAL PURPOSES     ONLY.  IF CONFIRMATION IS NEEDED     FOR ANY PURPOSE, NOTIFY LAB     WITHIN 5 DAYS.                LOWEST DETECTABLE LIMITS     FOR URINE DRUG SCREEN     Drug Class       Cutoff (ng/mL)     Amphetamine      1000     Barbiturate      200     Benzodiazepine   200     Tricyclics       300     Opiates          300     Cocaine          300     THC              50  VALPROIC ACID LEVEL     Status: Abnormal   Collection Time    06/23/13  2:56 PM      Result Value Range   Valproic Acid Lvl <10.0 (*) 50.0 - 100.0 ug/mL   Comment: Performed at [facility] are reviewed and are pertinent for no psychiatric issues.  Current Facility-Administered Medications  Medication Dose Route Frequency Provider Last Rate Last Dose  . benztropine (COGENTIN) tablet 1 mg  1 mg Oral BID Arman Filter, NP   1 mg at 06/24/13 1011  . divalproex (DEPAKOTE ER) 24 hr tablet 250 mg  250 mg Oral q morning - 10a Shuvon Rankin, NP   250 mg at 06/24/13 1011  . divalproex (DEPAKOTE ER) 24 hr tablet 500 mg  500 mg Oral QHS Shuvon Rankin, NP   500 mg at 06/23/13 2118  . OLANZapine zydis (ZYPREXA) disintegrating tablet 20  mg  20 mg Oral QHS Arman Filter, NP   20 mg at 06/23/13 2118  . Oxcarbazepine (TRILEPTAL) tablet 600 mg  600 mg Oral BID Arman Filter, NP   600 mg at 06/24/13 1011  . [START ON 06/30/2013] risperiDONE microspheres (RISPERDAL CONSTA) injection 25 mg  25 mg Intramuscular Q14 Days Arman Filter, NP      . traZODone (DESYREL) tablet 50 mg  50 mg Oral QHS PRN,MR X 1 Arman Filter, NP   50 mg at 06/23/13 2118   Current Outpatient Prescriptions  Medication Sig Dispense Refill  . benztropine (COGENTIN) 1 MG tablet Take 1 tablet (1 mg total) by mouth 2 (two) times daily.  60 tablet  0  . OLANZapine zydis (ZYPREXA) 20 MG disintegrating tablet Take  1 tablet (20 mg total) by mouth at bedtime.  30 tablet  0  . Oxcarbazepine (TRILEPTAL) 600 MG tablet Take 1 tablet (600 mg total) by mouth 2 (two) times daily.  60 tablet  0  . risperiDONE microspheres (RISPERDAL CONSTA) 25 MG injection Inject 2 mLs (25 mg total) into the muscle every 14 (fourteen) days. Patient received last dose on 06/16/13. He will receive the next injection in fourteen days from last injection.  1 each  0  . traZODone (DESYREL) 50 MG tablet Take 1 tablet (50 mg total) by mouth at bedtime as needed and may repeat dose one time if needed for sleep.  60 tablet  0     Mental status examination The patient remains very paranoid irritable for eye contact.  His thought process remains disorganized.  He has made homicidal thoughts but denies any suicidal thoughts or plan.  He appears responding to internal stimuli.  He described his mood as angry and his attention and concentration is fair.  He is oriented x3.  Insight judgment and impulse control is fair.  Treatment Plan Summary: Will continue with current treatment plan. Patient to be discharge Monday after meeting with ACT team and arrangement for follow up with primary provider.     Gabriana Wilmott T. 06/24/2013 10:53 AM

## 2013-06-24 NOTE — ED Notes (Signed)
Patient presents cooperative with pleasant mood; denies any current pain, denies suicidality or homicidality. Patient continues to respond to internal stimuli.

## 2013-06-24 NOTE — ED Notes (Signed)
Pt does not want splint reapplied-will contact ortho tech and EDP

## 2013-06-24 NOTE — ED Notes (Signed)
Up to the desk 

## 2013-06-24 NOTE — ED Notes (Signed)
Up walking restless, cursing, back to his room watching tv and calmed.
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
## 2013-06-24 NOTE — ED Notes (Signed)
EDP into see 

## 2013-06-24 NOTE — ED Notes (Signed)
Dr Arfeen into see 

## 2013-06-24 NOTE — ED Notes (Signed)
NAD, watching tv.  Pt reports that he hit his hand on the room door and is requesting pain medication.  Pt also requesting something for his nerves.  NAD, watching tv, eating snack.  No deformity/swelling/brusing noted/good ROM all digits.

## 2013-06-24 NOTE — ED Notes (Signed)
Dr Lawrence Marseilles updated and will eval.

## 2013-06-24 NOTE — ED Provider Notes (Signed)
Patient with a persistent boxer's fracture however split was ordered and patient is refusing to leave the splint on. This is the fourth time attempted to be splinted and he is refusing to wear it. Patient cannot wear a Velcro splint because of the strings. Patient was informed that this could cause a nonunion and poor healing and he still refuses the splint  Gwyneth Sprout, MD 06/24/13 2312

## 2013-06-24 NOTE — ED Notes (Signed)
Up to the desk on the phone 

## 2013-06-25 DIAGNOSIS — F259 Schizoaffective disorder, unspecified: Secondary | ICD-10-CM

## 2013-06-25 NOTE — BH Assessment (Addendum)
[name] from [facility] assessed patient. [name] will work with pt on a daily basis for several wks and also have their housing specialist work with him as he is homeless. [name] says she has given pt her business card. RN and EDP notified. Pt will be d/c to a homeless shelter with followup by [facility].  IVC has been reversed by Dr. Blinda Leatherwood. IVC reversal paperwork placed in IVC log in nurses' station in psych ED.  [name], [place] Assessment Counselor

## 2013-06-25 NOTE — Consult Note (Signed)
Patient Identification:  [name] Date of Evaluation:  06/25/2013   History of Present Illness:  Patient is calm and cooperative.  Patient states he is ready to be discharged but he will wait to be seen by staff of [facility].   Patient denies SI/HI/AVH.  We will stop his Depakote and keep him on Trileptal.  Patient states he does not like how Depakote makes him feel.  We will also refer him to be followed by [facility] for his medication management.  Patient will be discharged after visit from ACT team.  Past Psychiatric History:  Schizoaffective d/o   Past Medical History:     Past Medical History  Diagnosis Date  . Schizophrenic disorder   . Boxer's fracture       History reviewed. No pertinent past surgical history.  Allergies:  Allergies  Allergen Reactions  . Haldol [Haloperidol Decanoate] Other (See Comments)    'I curl up"    Current Medications:  Prior to Admission medications   Medication Sig Start Date End Date Taking? Authorizing Provider  benztropine (COGENTIN) 1 MG tablet Take 1 tablet (1 mg total) by mouth 2 (two) times daily. 06/19/13  Yes Fransisca Kaufmann, NP  OLANZapine zydis (ZYPREXA) 20 MG disintegrating tablet Take 1 tablet (20 mg total) by mouth at bedtime. 06/20/13  Yes Fransisca Kaufmann, NP  Oxcarbazepine (TRILEPTAL) 600 MG tablet Take 1 tablet (600 mg total) by mouth 2 (two) times daily. 06/19/13  Yes Fransisca Kaufmann, NP  risperiDONE microspheres (RISPERDAL CONSTA) 25 MG injection Inject 2 mLs (25 mg total) into the muscle every 14 (fourteen) days. Patient received last dose on 06/16/13. He will receive the next injection in fourteen days from last injection. 06/19/13  Yes Fransisca Kaufmann, NP  traZODone (DESYREL) 50 MG tablet Take 1 tablet (50 mg total) by mouth at bedtime as needed and may repeat dose one time if needed for sleep. 06/19/13  Yes Fransisca Kaufmann, NP    Social History:    reports that he has been smoking Cigarettes.  He has a 15 pack-year smoking history. He  has never used smokeless tobacco. He reports that  drinks alcohol. He reports that he does not use illicit drugs.   Family History:    History reviewed. No pertinent family history.  Mental Status Examination/Evaluation: Psychiatric Specialty Exam: @PHYSEXAMBYAGE2 @  @ROS @  Blood pressure 113/73, pulse 67, temperature 98 F (36.7 C), temperature source Oral, resp. rate 16, SpO2 97.00%.There is no weight on file to calculate BMI.  General Appearance: Casual  Eye Contact::  Good  Speech:  Clear and Coherent  Volume:  Normal  Mood:  Anxious  Affect:  Congruent  Thought Process:  Coherent and Intact  Orientation:  Full (Time, Place, and Person)  Thought Content:  Paranoid Ideation  Suicidal Thoughts:  No  Homicidal Thoughts:  No  Memory:  Immediate;   Good Recent;   Good Remote;   Good  Judgement:  Fair  Insight:  Fair  Psychomotor Activity:  Normal  Concentration:  Fair  Recall:  NA  Akathisia:  NA  Handed:  Right  AIMS (if indicated):     Assets:  Desire for Improvement  Sleep:          DIAGNOSIS:   AXIS I   Schizoaffective d/o  AXIS II  Deffered  AXIS III See medical notes.  AXIS IV housing problems, occupational problems, other psychosocial or environmental problems, problems related to social environment and problems with primary support group  AXIS V 51-60 moderate symptoms     Assessment/Plan: Consult and face to face interview with Dr Lolly Mustache We will discharge patient today after he is seen by a staff from [facility] We will refer patient to [facility].  I have personally seen the patient and agreed with the findings and involved in the treatment plan. Kathryne Sharper, MD

## 2013-06-25 NOTE — Progress Notes (Signed)
Patient ID: [name], male   DOB: 02-Oct-1975, 37 y.o.   MRN: [id number] Patient is discharged to the shelter to be followed by [facility] (( [facility] ) Dahlia Byes  PMHNP-BC.

## 2013-06-25 NOTE — BH Assessment (Signed)
Writer spoke with Judeth Cornfield at [employer] [phone]. Her coworker [name] will come to [facility] today to assess pt. If pt is d/c prior to Anna's arrival, TTS/CSW pls notify [facility] and be sure pt has [facility]'s phone number. Judeth Cornfield says that pt has signed paperwork to become pt, however pt hasn't been formally assessed or accepted by [facility]'s MD.   [name], LCSWA Assessment Counselor

## 2013-06-25 NOTE — ED Notes (Signed)
Patient c/o pain in Right hand from earlier break, 6/10.

## 2013-06-25 NOTE — Progress Notes (Signed)
Per discussion with psychiatrist, patient act team needs to come to evaluate patient in the emergency department. Per chart review and discussion patient has numerous ED admissions and [facility] admissions. Per discussion with psychiatrist, input from pt current community actt team would be much appreciated. CSW called [facility] at [phone]. At this time they are currently in their morning meeting and requested to call back at 10am. CSW held for an operator who is giving the team a message to call this writer back regarding client.   Frutoso Schatz [phone]  ED CSW 06/25/2013 8:31am

## 2013-07-31 DIAGNOSIS — F209 Schizophrenia, unspecified: Secondary | ICD-10-CM | POA: Diagnosis not present

## 2013-07-31 DIAGNOSIS — M25549 Pain in joints of unspecified hand: Secondary | ICD-10-CM | POA: Diagnosis not present

## 2013-07-31 DIAGNOSIS — R000 Tachycardia, unspecified: Secondary | ICD-10-CM | POA: Diagnosis not present

## 2013-07-31 DIAGNOSIS — Z79899 Other long term (current) drug therapy: Secondary | ICD-10-CM | POA: Diagnosis not present

## 2013-07-31 DIAGNOSIS — F17200 Nicotine dependence, unspecified, uncomplicated: Secondary | ICD-10-CM | POA: Diagnosis not present

## 2013-07-31 DIAGNOSIS — M545 Low back pain: Secondary | ICD-10-CM | POA: Diagnosis not present

## 2013-07-31 DIAGNOSIS — F319 Bipolar disorder, unspecified: Secondary | ICD-10-CM | POA: Diagnosis not present

## 2014-02-05 DIAGNOSIS — L03211 Cellulitis of face: Secondary | ICD-10-CM | POA: Diagnosis not present

## 2014-02-05 DIAGNOSIS — L0201 Cutaneous abscess of face: Secondary | ICD-10-CM | POA: Diagnosis not present

## 2014-02-05 DIAGNOSIS — F17200 Nicotine dependence, unspecified, uncomplicated: Secondary | ICD-10-CM | POA: Diagnosis not present

## 2014-11-05 ENCOUNTER — Encounter (HOSPITAL_COMMUNITY): Payer: Self-pay | Admitting: *Deleted

## 2014-11-05 ENCOUNTER — Emergency Department (HOSPITAL_COMMUNITY)
Admission: EM | Admit: 2014-11-05 | Discharge: 2014-11-05 | Disposition: A | Discharge status: Home / Self Care | Payer: Medicare Other | Attending: Emergency Medicine | Admitting: Emergency Medicine

## 2014-11-05 DIAGNOSIS — M533 Sacrococcygeal disorders, not elsewhere classified: Secondary | ICD-10-CM | POA: Diagnosis not present

## 2014-11-05 DIAGNOSIS — F259 Schizoaffective disorder, unspecified: Secondary | ICD-10-CM | POA: Diagnosis not present

## 2014-11-05 DIAGNOSIS — Z720 Tobacco use: Secondary | ICD-10-CM | POA: Diagnosis not present

## 2014-11-05 DIAGNOSIS — Z79899 Other long term (current) drug therapy: Secondary | ICD-10-CM | POA: Diagnosis not present

## 2014-11-05 DIAGNOSIS — M545 Low back pain, unspecified: Secondary | ICD-10-CM

## 2014-11-05 MED ORDER — DIAZEPAM 5 MG PO TABS
5.0000 mg | ORAL_TABLET | Freq: Once | ORAL | Status: AC
  Administered 2014-11-05: 5 mg via ORAL
  Filled 2014-11-05: qty 1

## 2014-11-05 MED ORDER — OXYCODONE-ACETAMINOPHEN 5-325 MG PO TABS
2.0000 | ORAL_TABLET | Freq: Once | ORAL | Status: AC
  Administered 2014-11-05: 2 via ORAL
  Filled 2014-11-05: qty 2

## 2014-11-05 MED ORDER — OXYCODONE-ACETAMINOPHEN 5-325 MG PO TABS: 1.0000 | ORAL_TABLET | ORAL | Status: DC | PRN

## 2014-11-05 MED ORDER — IBUPROFEN 600 MG PO TABS: 600.0000 mg | ORAL_TABLET | Freq: Four times a day (QID) | ORAL | Status: DC | PRN

## 2014-11-05 MED ORDER — IBUPROFEN 200 MG PO TABS
600.0000 mg | ORAL_TABLET | Freq: Once | ORAL | Status: AC
  Administered 2014-11-05: 600 mg via ORAL
  Filled 2014-11-05: qty 3

## 2014-11-05 NOTE — ED Notes (Signed)
Pt states he's having lower back pain across back, denies more on one side, states he also needs a break from society, denies SI/HI.

## 2014-11-05 NOTE — ED Notes (Signed)
Bed: [id number] Expected date:  Expected time:  Means of arrival:  Comments: EMS 39 yo male right mid back pain for 1 month

## 2014-11-05 NOTE — ED Notes (Signed)
[facility] states past month on/off has had R sided back pain, 10/10, pt ambulated w/ EMS to room w/o difficulty.

## 2014-11-05 NOTE — Discharge Instructions (Signed)
Back Pain, Adult Low back pain is very common. About 1 in 5 people have back pain.The cause of low back pain is rarely dangerous. The pain often gets better over time.About half of people with a sudden onset of back pain feel better in just 2 weeks. About 8 in 10 people feel better by 6 weeks.  CAUSES Some common causes of back pain include:  Strain of the muscles or ligaments supporting the spine.  Wear and tear (degeneration) of the spinal discs.  Arthritis.  Direct injury to the back. DIAGNOSIS Most of the time, the direct cause of low back pain is not known.However, back pain can be treated effectively even when the exact cause of the pain is unknown.Answering your caregiver's questions about your overall health and symptoms is one of the most accurate ways to make sure the cause of your pain is not dangerous. If your caregiver needs more information, he or she may order lab work or imaging tests (X-rays or MRIs).However, even if imaging tests show changes in your back, this usually does not require surgery. HOME CARE INSTRUCTIONS For many people, back pain returns.Since low back pain is rarely dangerous, it is often a condition that people can learn to manageon their own.   Remain active. It is stressful on the back to sit or stand in one place. Do not sit, drive, or stand in one place for more than 30 minutes at a time. Take short walks on level surfaces as soon as pain allows.Try to increase the length of time you walk each day.  Do not stay in bed.Resting more than 1 or 2 days can delay your recovery.  Do not avoid exercise or work.Your body is made to move.It is not dangerous to be active, even though your back may hurt.Your back will likely heal faster if you return to being active before your pain is gone.  Pay attention to your body when you bend and lift. Many people have less discomfortwhen lifting if they bend their knees, keep the load close to their bodies,and  avoid twisting. Often, the most comfortable positions are those that put less stress on your recovering back.  Find a comfortable position to sleep. Use a firm mattress and lie on your side with your knees slightly bent. If you lie on your back, put a pillow under your knees.  Only take over-the-counter or prescription medicines as directed by your caregiver. Over-the-counter medicines to reduce pain and inflammation are often the most helpful.Your caregiver may prescribe muscle relaxant drugs.These medicines help dull your pain so you can more quickly return to your normal activities and healthy exercise.  Put ice on the injured area.  Put ice in a plastic bag.  Place a towel between your skin and the bag.  Leave the ice on for 15-20 minutes, 03-04 times a day for the first 2 to 3 days. After that, ice and heat may be alternated to reduce pain and spasms.  Ask your caregiver about trying back exercises and gentle massage. This may be of some benefit.  Avoid feeling anxious or stressed.Stress increases muscle tension and can worsen back pain.It is important to recognize when you are anxious or stressed and learn ways to manage it.Exercise is a great option. SEEK MEDICAL CARE IF:  You have pain that is not relieved with rest or medicine.  You have pain that does not improve in 1 week.  You have new symptoms.  You are generally not feeling well. SEEK   IMMEDIATE MEDICAL CARE IF:   You have pain that radiates from your back into your legs.  You develop new bowel or bladder control problems.  You have unusual weakness or numbness in your arms or legs.  You develop nausea or vomiting.  You develop abdominal pain.  You feel faint. Document Released: 09/13/2005 Document Revised: 03/14/2012 Document Reviewed: 01/15/2014 Exit[employer] Patient Information 2015 [employer]. This information is not intended to replace advice given to you by your health care provider. Make sure you  discuss any questions you have with your health care provider.  

## 2014-11-16 ENCOUNTER — Encounter (HOSPITAL_COMMUNITY): Payer: Self-pay | Admitting: Emergency Medicine

## 2014-11-16 ENCOUNTER — Emergency Department (HOSPITAL_COMMUNITY)
Admission: EM | Admit: 2014-11-16 | Discharge: 2014-11-16 | Disposition: A | Discharge status: Home / Self Care | Payer: Medicare Other | Attending: Emergency Medicine | Admitting: Emergency Medicine

## 2014-11-16 DIAGNOSIS — M549 Dorsalgia, unspecified: Secondary | ICD-10-CM | POA: Diagnosis not present

## 2014-11-16 DIAGNOSIS — Z720 Tobacco use: Secondary | ICD-10-CM | POA: Insufficient documentation

## 2014-11-16 DIAGNOSIS — Z8781 Personal history of (healed) traumatic fracture: Secondary | ICD-10-CM | POA: Insufficient documentation

## 2014-11-16 DIAGNOSIS — Z79899 Other long term (current) drug therapy: Secondary | ICD-10-CM | POA: Insufficient documentation

## 2014-11-16 DIAGNOSIS — F209 Schizophrenia, unspecified: Secondary | ICD-10-CM | POA: Diagnosis not present

## 2014-11-16 DIAGNOSIS — M5441 Lumbago with sciatica, right side: Secondary | ICD-10-CM

## 2014-11-16 DIAGNOSIS — M545 Low back pain: Secondary | ICD-10-CM | POA: Diagnosis present

## 2014-11-16 MED ORDER — OXYCODONE-ACETAMINOPHEN 5-325 MG PO TABS
2.0000 | ORAL_TABLET | Freq: Once | ORAL | Status: AC
  Administered 2014-11-16: 2 via ORAL
  Filled 2014-11-16: qty 2

## 2014-11-16 MED ORDER — OXYCODONE-ACETAMINOPHEN 5-325 MG PO TABS: 1.0000 | ORAL_TABLET | Freq: Four times a day (QID) | ORAL | Status: DC | PRN

## 2014-11-16 MED ORDER — IBUPROFEN 600 MG PO TABS: 600.0000 mg | ORAL_TABLET | Freq: Four times a day (QID) | ORAL | Status: DC

## 2014-11-16 NOTE — ED Notes (Signed)
Pt was seen for pinched nerve in back and states he is back pain management.

## 2014-11-16 NOTE — ED Provider Notes (Signed)
CSN: [id number]     Arrival date & time 11/16/14  0035 History   First MD Initiated Contact with Patient 11/16/14 0049     Chief Complaint  Patient presents with  . Back Pain    (Consider location/radiation/quality/duration/timing/severity/associated sxs/prior Treatment) HPI Comments: Patient is a 39 year old male who presents to the emergency department for further evaluation of right-sided low back pain. Patient states that he has had his back pain for 1 month. It has been waxing and waning in severity, but worsened again 2 days ago. Patient reports being seen for similar symptoms on 11/05/2014. He states that the medications he was prescribed helped with his intermittent pain. He reports that he lost his discharge instructions and, therefore, was unable to follow-up with an orthopedic specialist. Patient denies any new injury or trauma to his back. He states that he has pain radiating down his right leg at times. He states his back pain is worse with certain movements as well as palpation to the area. Patient denies associated fever, history of cancer, history of IV drug use, extremity numbness/weakness, or bowel/bladder incontinence.  Patient is a 39 y.o. male presenting with back pain. The history is provided by the patient. No language interpreter was used.  Back Pain   Past Medical History  Diagnosis Date  . Schizophrenic disorder   . Boxer's fracture    History reviewed. No pertinent past surgical history. History reviewed. No pertinent family history. History  Substance Use Topics  . Smoking status: Current Every Day Smoker -- 1.00 packs/day for 15 years    Types: Cigarettes  . Smokeless tobacco: Never Used  . Alcohol Use: No     Comment: seldom    Review of Systems  Musculoskeletal: Positive for back pain.  All other systems reviewed and are negative.   Allergies  Haldol  Home Medications   Prior to Admission medications   Medication Sig Start Date End Date Taking?  Authorizing Provider  OLANZapine zydis (ZYPREXA) 20 MG disintegrating tablet Take 1 tablet (20 mg total) by mouth at bedtime. 06/20/13  Yes Fransisca KaufmannLaura Davis, NP  benztropine (COGENTIN) 1 MG tablet Take 1 tablet (1 mg total) by mouth 2 (two) times daily. Patient not taking: Reported on 11/05/2014 06/19/13   Fransisca KaufmannLaura Davis, NP  ibuprofen (ADVIL,MOTRIN) 600 MG tablet Take 1 tablet (600 mg total) by mouth 4 (four) times daily. 11/16/14   Antony MaduraKelly Kaira Stringfield, PA-C  Oxcarbazepine (TRILEPTAL) 600 MG tablet Take 1 tablet (600 mg total) by mouth 2 (two) times daily. Patient not taking: Reported on 11/05/2014 06/19/13   Fransisca KaufmannLaura Davis, NP  oxyCODONE-acetaminophen (PERCOCET/ROXICET) 5-325 MG per tablet Take 1 tablet by mouth every 6 (six) hours as needed for severe pain. 11/16/14   Antony MaduraKelly Yashica Sterbenz, PA-C  risperiDONE microspheres (RISPERDAL CONSTA) 25 MG injection Inject 2 mLs (25 mg total) into the muscle every 14 (fourteen) days. Patient received last dose on 06/16/13. He will receive the next injection in fourteen days from last injection. 06/19/13   Fransisca KaufmannLaura Davis, NP  traZODone (DESYREL) 50 MG tablet Take 1 tablet (50 mg total) by mouth at bedtime as needed and may repeat dose one time if needed for sleep. Patient not taking: Reported on 11/05/2014 06/19/13   Fransisca KaufmannLaura Davis, NP   BP 136/87 mmHg  Pulse 91  Temp(Src) 97.6 F (36.4 C) (Oral)  Resp 14  SpO2 98%   Physical Exam  Constitutional: He is oriented to person, place, and time. He appears well-developed and well-nourished. No distress.  Nontoxic/nonseptic appearing  HENT:  Head: Normocephalic and atraumatic.  Eyes: Conjunctivae and EOM are normal. No scleral icterus.  Neck: Normal range of motion.  Cardiovascular: Normal rate, regular rhythm and intact distal pulses.   DP and PT pulses 2+ bilaterally  Pulmonary/Chest: Effort normal. No respiratory distress.  Respirations even and unlabored  Musculoskeletal: Normal range of motion. He exhibits tenderness.  Normal range of motion of  back with tenderness to palpation to the right lumbar paraspinal muscles. No bony deformities, step-offs, or crepitus noted to the lumbar midline. No appreciable spasm. Negative straight leg raise; positive crossed straight leg raise.  Neurological: He is alert and oriented to person, place, and time. He exhibits normal muscle tone. Coordination normal.  Sensation to light touch intact. Patient ambulatory with steady gait. GCS 15.  Skin: Skin is warm and dry. No rash noted. He is not diaphoretic. No erythema. No pallor.  Psychiatric: He has a normal mood and affect. His behavior is normal.  Nursing note and vitals reviewed.   ED Course  Procedures (including critical care time) Labs Review Labs Reviewed - No data to display  Imaging Review No results found.   EKG Interpretation None      MDM   Final diagnoses:  Right-sided low back pain with right-sided sciatica    Patient with back pain. Patient neurovascularly intact. No sensory deficits appreciated. Patient ambulatory with steady gait. No loss of bowel or bladder control. No concern for cauda equina. No fever, h/o cancer, or h/o IVDU. RICE protocol and pain medicine indicated and discussed with patient. Orthopedic referral given and return precautions provided. Patient agreeable to plan with no unaddressed concerns.   Filed Vitals:   11/16/14 0046  BP: 136/87  Pulse: 91  Temp: 97.6 F (36.4 C)  TempSrc: Oral  Resp: 14  SpO2: 98%     Antony Madura, PA-C 11/16/14 0203  Layla Maw Ward, DO 11/16/14 [phone]

## 2014-11-16 NOTE — Discharge Instructions (Signed)
Back Pain, Adult °Low back pain is very common. About 1 in 5 people have back pain. The cause of low back pain is rarely dangerous. The pain often gets better over time. About half of people with a sudden onset of back pain feel better in just 2 weeks. About 8 in 10 people feel better by 6 weeks.  °CAUSES °Some common causes of back pain include: °· Strain of the muscles or ligaments supporting the spine. °· Wear and tear (degeneration) of the spinal discs. °· Arthritis. °· Direct injury to the back. °DIAGNOSIS °Most of the time, the direct cause of low back pain is not known. However, back pain can be treated effectively even when the exact cause of the pain is unknown. Answering your caregiver's questions about your overall health and symptoms is one of the most accurate ways to make sure the cause of your pain is not dangerous. If your caregiver needs more information, he or she may order lab work or imaging tests (X-rays or MRIs). However, even if imaging tests show changes in your back, this usually does not require surgery. °HOME CARE INSTRUCTIONS °For many people, back pain returns. Since low back pain is rarely dangerous, it is often a condition that people can learn to manage on their own.  °· Remain active. It is stressful on the back to sit or stand in one place. Do not sit, drive, or stand in one place for more than 30 minutes at a time. Take short walks on level surfaces as soon as pain allows. Try to increase the length of time you walk each day. °· Do not stay in bed. Resting more than 1 or 2 days can delay your recovery. °· Do not avoid exercise or work. Your body is made to move. It is not dangerous to be active, even though your back may hurt. Your back will likely heal faster if you return to being active before your pain is gone. °· Pay attention to your body when you  bend and lift. Many people have less discomfort when lifting if they bend their knees, keep the load close to their bodies, and  avoid twisting. Often, the most comfortable positions are those that put less stress on your recovering back. °· Find a comfortable position to sleep. Use a firm mattress and lie on your side with your knees slightly bent. If you lie on your back, put a pillow under your knees. °· Only take over-the-counter or prescription medicines as directed by your caregiver. Over-the-counter medicines to reduce pain and inflammation are often the most helpful. Your caregiver may prescribe muscle relaxant drugs. These medicines help dull your pain so you can more quickly return to your normal activities and healthy exercise. °· Put ice on the injured area. °¨ Put ice in a plastic bag. °¨ Place a towel between your skin and the bag. °¨ Leave the ice on for 15-20 minutes, 03-04 times a day for the first 2 to 3 days. After that, ice and heat may be alternated to reduce pain and spasms. °· Ask your caregiver about trying back exercises and gentle massage. This may be of some benefit. °· Avoid feeling anxious or stressed. Stress increases muscle tension and can worsen back pain. It is important to recognize when you are anxious or stressed and learn ways to manage it. Exercise is a great option. °SEEK MEDICAL CARE IF: °· You have pain that is not relieved with rest or medicine. °· You have pain that does not improve in 1 week. °· You have new symptoms. °· You are generally not feeling well. °SEEK   IMMEDIATE MEDICAL CARE IF:  °· You have pain that radiates from your back into your legs. °· You develop new bowel or bladder control problems. °· You have unusual weakness or numbness in your arms or legs. °· You develop nausea or vomiting. °· You develop abdominal pain. °· You feel faint. °Document Released: 09/13/2005 Document Revised: 03/14/2012 Document Reviewed: 01/15/2014 °ExitCare® Patient Information ©2015 [employer]. This information is not intended to replace advice given to you by your health care provider. Make sure you  discuss any questions you have with your health care provider. ° ° ° ° °Emergency Department Resource Guide °1) Find a Doctor and Pay Out of Pocket °Although you won't have to find out who is covered by your insurance plan, it is a good idea to ask around and get recommendations. You will then need to call the office and see if the doctor you have chosen will accept you as a new patient and what types of options they offer for patients who are self-pay. Some doctors offer discounts or will set up payment plans for their patients who do not have insurance, but you will need to ask so you aren't surprised when you get to your appointment. ° °2) Contact Your Local Health Department °Not all health departments have doctors that can see patients for sick visits, but many do, so it is worth a call to see if yours does. If you don't know where your local health department is, you can check in your phone book. The CDC also has a tool to help you locate your state's health department, and many state websites also have listings of all of their local health departments. ° °3) Find a Walk-in Clinic °If your illness is not likely to be very severe or complicated, you may want to try a walk in clinic. These are popping up all over the country in pharmacies, drugstores, and shopping centers. They're usually staffed by nurse practitioners or physician assistants that have been trained to treat common illnesses and complaints. They're usually fairly quick and inexpensive. However, if you have serious medical issues or chronic medical problems, these are probably not your best option. ° °No Primary Care Doctor: °- Call [facility] at  832-8000 - they can help you locate a primary care doctor that  accepts your insurance, provides certain services, etc. °- Physician Referral Service- 1-800-533-3463 ° °Chronic Pain Problems: °Organization         Address  Phone   Notes  °[facility]  (336) 297-2271 Patients need  to be referred by their primary care doctor.  ° °Medication Assistance: °Organization         Address  Phone   Notes  °[place] [facility] [address] °[place], [place] [address] (336) 641-8030 --Must be a resident of [place] °-- Must have NO insurance coverage whatsoever (no Medicaid/ Medicare, etc.) °-- The pt. MUST have a primary care doctor that directs their care regularly and follows them in the community °  °[facility]  (866) 331-1348   °[employer]  (888) 892-1162   ° °Agencies that provide inexpensive medical care: °Organization         Address  Phone   Notes  °[facility]  (336) 832-8035   °[facility]    (336) 832-7272   °[facility] [address] °[place], [place] [address] (336) 832-4777   °[facility] [address], °[place] (336) 271-4999   °[facility]    (336) 373-0678   °[facility]    (336) 272-1050   °[facility] ° [address], [place] Phone:  (336) 832-4444, Fax:  (336) 832-4440 Hours of Operation:  9 am - 6 pm, M-F.  Also accepts Medicaid/Medicare and self-pay.  °[facility] ° [address], [place] Phone: (336) 832-3150, Fax: (336) 832-3151. Hours of Operation:  8:30 am - 5:30 pm, M-F.  Also accepts Medicaid and self-pay.  °[facility] [place] [address], [place] Phone: (336) 878-6027   °[facility] [address], [place], [place] (336)723-1848, Ext. 123 Mondays & Thursdays: 7-9 AM.  First 15 patients are seen on a first come, first serve basis. °  ° °Medicaid-accepting [place] Providers: ° °Organization         Address  Phone   Notes  °[facility] [address], [place] (336) 641-2100 Also accepts self-pay patients.  °[facility] [address], [place] ° (336) 856-9996   °[facility] [address], [place] (336) 288-8857   °[facility] [address] [place] [address], [place] (336) 299-7000   °Veita Bland [address], [place]  ° (336) 373-1557 Only accepts [place] Access Medicaid patients after they have their name applied to their card.  ° °Self-Pay (no insurance) in [place]: ° °Organization         Address  Phone   Notes  °Sickle Cell Patients, [facility] [address], [place] (336) 832-1970   °[facility] [address], [place] (336) 832-4400   ° [facility] ° [address], [place] (336) 992-4800   °[facility]/Dr. Osei-Bonsu ° [address] [place] [address], [place] or [address], [place] (336) 841-8500 Phone number for both [place] and [place] locations is the same.  °[facility] [address], [place] (336) 299-0000   °[facility] [address] [place] [address], [place] or [address] (336) 852-7530 °(336) 878-2260   °[facility] [address], [place] (336) 350-1642, phone; (336) 294-5005, fax Sees patients 1st and 3rd Saturday of every month.  Must not qualify for public or private insurance (i.e. Medicaid, Medicare, [place] Health Choice, Veterans' Benefits) • Household income should be no more than 200% of the poverty level •The clinic cannot treat you if you are pregnant or think you are pregnant • Sexually transmitted diseases are not treated at the clinic.  ° ° °Dental Care: °Organization         Address  Phone  Notes  °[facility] [address], [place] (336) 641-6152 Accepts children up to age 21 who are enrolled in Medicaid or [place] Health Choice; pregnant women with a Medicaid card; and children who have applied for Medicaid or [place] Health Choice, but were declined, whose  parents can pay a reduced fee at time of service.  °[facility] [place]  [address], [place] (336) 641-7733 Accepts children up to age 21 who are enrolled in Medicaid or [place] Health Choice; pregnant women with a Medicaid card; and children who have applied for Medicaid   or [place] Health Choice, but were declined, whose parents can pay a reduced fee at time of service.  °[facility] ° [address], [place] (336) 641-4533 Patients are seen by appointment only. Walk-ins are not accepted. [facility] will see patients 18 years of age and older. °Monday - Tuesday (8am-5pm) °Most Wednesdays (8:30-5pm) °$30 per visit, cash only  °[facility] ° [address], [place] (336) 641-4533 Patients are seen by appointment only. Walk-ins are not accepted. [facility] will see patients 18 years of age and older. °One Wednesday Evening (Monthly: Volunteer Based).  $30 per visit, cash only  °[facility]  (919) 537-3737 for adults; Children under age 4, call Graduate Pediatric Dentistry at (919) [phone]. Children aged 4-14, please call (919) [phone] to request a pediatric application. ° Dental services are provided in all areas of dental care including fillings, crowns and bridges, complete and partial dentures, implants, gum treatment, root canals, and extractions. Preventive care is also provided. Treatment is provided to both adults and children. °Patients are selected via a lottery and there is often a waiting list. °  °[facility] [address], °[place] ° (336) 763-8833 [email or web address] °  °[facility] [address], [place], [place] (336)723-1848, Ext. 123 Second and Fourth Thursday of each month, opens at 6:30 AM; Clinic ends at 9 AM.  Patients are seen on a first-come first-served basis, and a limited number are seen during each clinic.  ° °[facility] °  [address], [place], [place] (336) 723-7904   Eligibility Requirements °You must have lived in [place], [place], or [place] counties for at least the last three months. °  You cannot be eligible for state or federal sponsored healthcare insurance, including Veterans Administration, Medicaid, or Medicare. °  You generally cannot be eligible for healthcare insurance through your employer.  °  How to apply: °Eligibility screenings are held every Tuesday and Wednesday afternoon from 1:00 pm until 4:00 pm. You do not need an appointment for the interview!  °[facility] [address], [place], [place] 336-631-2330   °[facility] [facility]  336-342-8273   °[facility]  336-703-3100   °[facility]  336-570-6415   ° °Behavioral Health Resources in the Community: °Intensive Outpatient Programs °Organization         Address  Phone  Notes  °[facility] [address], [place], [place] 336-878-6098   °[facility] [address], [place], [place] 336-832-9800   °[facility] [address], [place], [place] ° 336-882-2125   °[facility] [address],  °[place], [place] 1-800-853-5163 or 336-641-4981   °Substance Abuse Resources °Organization         Address  Phone  Notes  °[facility]  336-882-2125   °[facility]  336-784-9470   °[facility]  336-285-9073   °[facility]  336-845-3988   °Residential & Outpatient Substance Abuse Program  1-800-659-3381   °Psychological Services °Organization         Address  Phone  Notes  °[facility]  336- 832-9600   °[facility]  336- 378-7881   °[facility] [address], [place] 1-800-853-5163 or 336-641-4981   ° °Mobile Crisis Teams °Organization         Address  Phone    Notes  °[facility], Mobile Crisis Care Unit  1-877-626-1772     °Assertive °[facility] ° [address] [place], [place] 336-834-9664   °Sharon DeEsch [address] °[place] 336-554-5454   ° °Self-Help/Support Groups °Organization         Address  Phone             Notes  °[facility] [place] - variety of support groups  336- 373-1402 Call for more information  °Narcotics Anonymous (NA), [facility] [address], °[place]  2 meetings at this location  ° °Residential Treatment Programs °Organization         Address  Phone  Notes  °[facility] [address],    °[place]  1-866-801-8205   °[facility] ° [address], [place], [place] 704-293-8524   °[facility] [address], [place] 336-845-3988 Admissions: 8am-3pm M-F  °[facility] [address]    °[place], [place] 336-841-1104   °[facility] [address], [place], [place] 336-379-7146   °[facility] [address]  °[place], [place] 336-285-9073   °[facility] Intensive Outpatient [address], [place], [place] 336-852-3033   °[facility] [address]  °[place], [place] 1-877-615-2722 or 336-784-9470   °[facility] [address] [place], [place] 336-227-7417 Accepts Medicaid  °[facility] [address]  °[place] 1-800-659-3381 Substance Abuse/Addiction Treatment  ° °[place] Behavioral Health Resources °Organization         Address  Phone  Notes  °[facility]  (888) 581-9988   °Julie Brannon, PhD [address] [place], [place]   (336) 349-5553 or (336) 951-0000   °[facility]   [address] °[place], [place] (336) 349-4454   °[facility] [address], [place], [place] (336) 342-8316 Insurance/Medicaid/sponsorship through [facility]  °[facility] [address]                                     [place], [place] (336) 342-8316 Therapy/tele-psych/case  °[facility] [address]  ° [place], [place] (336) 349-2233    °Dr. Arfeen  (336) 349-4544   °[facility] [place]  [employer] [place] [facility] 1) [address], [place] °2) [address], [place] °3)  [address], [place] (336) 349-3220 °(336) 342-7768 ° °(336) 342-8140   °[place] [facility] (336) 342-1394 or (336) 342-3537 (After Hours)    ° ° ° ° °

## 2014-11-16 NOTE — ED Notes (Signed)
Bed: WLPT1 Expected date:  Expected time:  Means of arrival:  Comments: EMS back pain

## 2014-11-16 NOTE — ED Provider Notes (Signed)
CSN: [id number]     Arrival date & time 11/05/14  0244 History   First MD Initiated Contact with Patient 11/05/14 [phone]     Chief Complaint  Patient presents with  . Back Pain     (Consider location/radiation/quality/duration/timing/severity/associated sxs/prior Treatment) HPI   38yM with lower back pain. Worse on R. Sometimes radiates into RLE. Intermittent. Tends to be worse with movement and certain positions. Increasing over past month. No fever or chills. No numbness, tingling or loss of strength. No urinary complaints. Denies use of blood thinners. No hx of seizure. No ivdu.   Past Medical History  Diagnosis Date  . Schizophrenic disorder   . Boxer's fracture    History reviewed. No pertinent past surgical history. No family history on file. History  Substance Use Topics  . Smoking status: Current Every Day Smoker -- 1.00 packs/day for 15 years    Types: Cigarettes  . Smokeless tobacco: Never Used  . Alcohol Use: No     Comment: seldom    Review of Systems  All systems reviewed and negative, other than as noted in HPI.   Allergies  Haldol  Home Medications   Prior to Admission medications   Medication Sig Start Date End Date Taking? Authorizing Provider  OLANZapine zydis (ZYPREXA) 20 MG disintegrating tablet Take 1 tablet (20 mg total) by mouth at bedtime. 06/20/13  Yes Fransisca KaufmannLaura Davis, NP  risperiDONE microspheres (RISPERDAL CONSTA) 25 MG injection Inject 2 mLs (25 mg total) into the muscle every 14 (fourteen) days. Patient received last dose on 06/16/13. He will receive the next injection in fourteen days from last injection. 06/19/13  Yes Fransisca KaufmannLaura Davis, NP  benztropine (COGENTIN) 1 MG tablet Take 1 tablet (1 mg total) by mouth 2 (two) times daily. Patient not taking: Reported on 11/05/2014 06/19/13   Fransisca KaufmannLaura Davis, NP  ibuprofen (ADVIL,MOTRIN) 600 MG tablet Take 1 tablet (600 mg total) by mouth 4 (four) times daily. 11/16/14   Antony MaduraKelly Humes, PA-C  Oxcarbazepine (TRILEPTAL) 600 MG  tablet Take 1 tablet (600 mg total) by mouth 2 (two) times daily. Patient not taking: Reported on 11/05/2014 06/19/13   Fransisca KaufmannLaura Davis, NP  oxyCODONE-acetaminophen (PERCOCET/ROXICET) 5-325 MG per tablet Take 1 tablet by mouth every 6 (six) hours as needed for severe pain. 11/16/14   Antony MaduraKelly Humes, PA-C  traZODone (DESYREL) 50 MG tablet Take 1 tablet (50 mg total) by mouth at bedtime as needed and may repeat dose one time if needed for sleep. Patient not taking: Reported on 11/05/2014 06/19/13   Fransisca KaufmannLaura Davis, NP   BP 120/48 mmHg  Pulse 92  Temp(Src) 97.8 F (36.6 C) (Oral)  Resp 18  SpO2 98% Physical Exam  Constitutional: He appears well-developed and well-nourished. No distress.  HENT:  Head: Normocephalic and atraumatic.  Eyes: Conjunctivae are normal. Right eye exhibits no discharge. Left eye exhibits no discharge.  Neck: Neck supple.  Cardiovascular: Normal rate, regular rhythm and normal heart sounds.  Exam reveals no gallop and no friction rub.   No murmur heard. Pulmonary/Chest: Effort normal and breath sounds normal. No respiratory distress.  Abdominal: Soft. He exhibits no distension. There is no tenderness.  Musculoskeletal: He exhibits no edema or tenderness.       Arms: Mild TTP r paraspinal musculature. No midline tenderness. No overlying skin changes. Strength 5/5 b/l LE. Sensation intact to light touch. Ambulating w/o apparent difficulty. Patellar reflexes 2+. Palpable DP pulses.   Neurological: He is alert.  Skin: Skin is warm and dry.  Psychiatric: He has a normal mood and affect. His behavior is normal. Thought content normal.  Nursing note and vitals reviewed.   ED Course  Procedures (including critical care time) Labs Review Labs Reviewed - No data to display  Imaging Review No results found.   EKG Interpretation None      MDM   Final diagnoses:  Lumbosacral pain    38yM with lower back pain. Atraumatic. Nonfocal exam. No "red flags." Symptomatic tx of likely  strain.     Raeford Razor, MD 11/16/14 [id number]

## 2014-11-25 ENCOUNTER — Emergency Department (HOSPITAL_COMMUNITY)
Admission: EM | Admit: 2014-11-25 | Discharge: 2014-11-25 | Disposition: A | Discharge status: Home / Self Care | Payer: Medicare Other | Attending: Emergency Medicine | Admitting: Emergency Medicine

## 2014-11-25 ENCOUNTER — Encounter (HOSPITAL_COMMUNITY): Payer: Self-pay | Admitting: Emergency Medicine

## 2014-11-25 DIAGNOSIS — Z8781 Personal history of (healed) traumatic fracture: Secondary | ICD-10-CM | POA: Diagnosis not present

## 2014-11-25 DIAGNOSIS — M545 Low back pain: Secondary | ICD-10-CM | POA: Diagnosis not present

## 2014-11-25 DIAGNOSIS — M5440 Lumbago with sciatica, unspecified side: Secondary | ICD-10-CM | POA: Diagnosis not present

## 2014-11-25 DIAGNOSIS — Z760 Encounter for issue of repeat prescription: Secondary | ICD-10-CM | POA: Diagnosis not present

## 2014-11-25 DIAGNOSIS — F209 Schizophrenia, unspecified: Secondary | ICD-10-CM | POA: Diagnosis not present

## 2014-11-25 DIAGNOSIS — Z720 Tobacco use: Secondary | ICD-10-CM | POA: Insufficient documentation

## 2014-11-25 DIAGNOSIS — Z79899 Other long term (current) drug therapy: Secondary | ICD-10-CM | POA: Diagnosis not present

## 2014-11-25 DIAGNOSIS — M549 Dorsalgia, unspecified: Secondary | ICD-10-CM | POA: Diagnosis present

## 2014-11-25 MED ORDER — HYDROCODONE-ACETAMINOPHEN 5-325 MG PO TABS: 1.0000 | ORAL_TABLET | ORAL | Status: DC | PRN

## 2014-11-25 NOTE — ED Provider Notes (Signed)
CSN: [id number]     Arrival date & time 11/25/14  1220 History  This chart was scribed for Oswaldo Conroy, PA-C, working with Raelyn Number, DO by Jolene Provost, ED Scribe. This patient was seen in room WTR2/WLPT2 and the patient's care was started at 1:02 PM.     Chief Complaint  Patient presents with  . Back Pain  . Medication Refill   Patient is a 39 y.o. male presenting with back pain. The history is provided by the patient. No language interpreter was used.  Back Pain Associated symptoms: no dysuria, no fever, no numbness and no weakness     HPI Comments: MISHAEL HARAN is a 39 y.o. male who presents to the Emergency Department complaining of back pain for the last two days. Pt reports a hx of chronic back pain. Pt states he ran out of pain medication two days ago. Pt states he has not taken any other medication other than his oxycodone which was prescribed by the ER. Pt denies new injury. Pt states that last night he was trying to sleep and was unable to due to cramping and pain in back. Pt states he has occasional pain in feet. Pt denies saddle numbness, loss or bladder or bowel control, dysuria, urinary frequency or hesitancy. Pt denies numbness or weakness in feet.   Past Medical History  Diagnosis Date  . Schizophrenic disorder   . Boxer's fracture    History reviewed. No pertinent past surgical history. History reviewed. No pertinent family history. History  Substance Use Topics  . Smoking status: Current Every Day Smoker -- 1.00 packs/day for 15 years    Types: Cigarettes  . Smokeless tobacco: Never Used  . Alcohol Use: No     Comment: seldom    Review of Systems  Constitutional: Negative for fever and chills.  Gastrointestinal: Negative for diarrhea and constipation.  Genitourinary: Negative for dysuria, urgency and frequency.  Musculoskeletal: Positive for back pain.  Neurological: Negative for weakness and numbness.    Allergies  Haldol  Home Medications    Prior to Admission medications   Medication Sig Start Date End Date Taking? Authorizing Provider  benztropine (COGENTIN) 1 MG tablet Take 1 tablet (1 mg total) by mouth 2 (two) times daily. Patient not taking: Reported on 11/05/2014 06/19/13   Fransisca Kaufmann, NP  HYDROcodone-acetaminophen (NORCO/VICODIN) 5-325 MG per tablet Take 1-2 tablets by mouth every 4 (four) hours as needed. 11/25/14   Louann Sjogren, PA-C  ibuprofen (ADVIL,MOTRIN) 600 MG tablet Take 1 tablet (600 mg total) by mouth 4 (four) times daily. 11/16/14   Antony Madura, PA-C  OLANZapine zydis (ZYPREXA) 20 MG disintegrating tablet Take 1 tablet (20 mg total) by mouth at bedtime. 06/20/13   Fransisca Kaufmann, NP  Oxcarbazepine (TRILEPTAL) 600 MG tablet Take 1 tablet (600 mg total) by mouth 2 (two) times daily. Patient not taking: Reported on 11/05/2014 06/19/13   Fransisca Kaufmann, NP  oxyCODONE-acetaminophen (PERCOCET/ROXICET) 5-325 MG per tablet Take 1 tablet by mouth every 6 (six) hours as needed for severe pain. 11/16/14   Antony Madura, PA-C  risperiDONE microspheres (RISPERDAL CONSTA) 25 MG injection Inject 2 mLs (25 mg total) into the muscle every 14 (fourteen) days. Patient received last dose on 06/16/13. He will receive the next injection in fourteen days from last injection. 06/19/13   Fransisca Kaufmann, NP  traZODone (DESYREL) 50 MG tablet Take 1 tablet (50 mg total) by mouth at bedtime as needed and may repeat dose one time if  needed for sleep. Patient not taking: Reported on 11/05/2014 06/19/13   Fransisca KaufmannLaura Davis, NP   BP 120/75 mmHg  Pulse 101  Temp(Src) 97.9 F (36.6 C) (Oral)  Resp 16  SpO2 99% Physical Exam  Constitutional: He appears well-developed and well-nourished. No distress.  HENT:  Head: Normocephalic and atraumatic.  Eyes: Conjunctivae are normal. Right eye exhibits no discharge. Left eye exhibits no discharge.  Cardiovascular: Normal rate, regular rhythm and normal heart sounds.   Pulmonary/Chest: Effort normal and breath sounds normal.  No respiratory distress. He has no wheezes.  Abdominal: Soft. Bowel sounds are normal. He exhibits no distension. There is no tenderness.  Musculoskeletal:  No midline back tenderness, step off or crepitus. Left sided lower back tenderness. No CVA tenderness.   Neurological: He is alert. Coordination normal.  Equal muscle tone. 5/5 strength in lower extremities. DTR equal and intact. Negative straight leg test. Antalgic gait.   Skin: Skin is warm and dry. He is not diaphoretic.  Nursing note and vitals reviewed.   ED Course  Procedures  DIAGNOSTIC STUDIES: Oxygen Saturation is 99% on RA, normal by my interpretation.    COORDINATION OF CARE: 1:08 PM Discussed treatment plan with pt at bedside and pt agreed to plan.  Labs Review Labs Reviewed - No data to display  Imaging Review No results found.   EKG Interpretation None      MDM   Final diagnoses:  Left low back pain, with sciatica presence unspecified   Patient with back pain. Patient presenting for medication refill. He has used 15 Percocet in the past 9 days. No loss of bowel or bladder control. No saddle anesthesia. No fever, night sweats, weight loss, h/o cancer, IVDU. VSS. No neurological deficits and normal neuro exam. Patient ambulatory. No concern for cauda equina.  RICE protocol and pain medicine indicated and discussed with patient. Driving and sedation precautions provided. Discussed that further pain medication needs to come from his primary care provider. Patient without primary care provider he's been given a referral to the [facility] and encouraged to follow up. Patient is afebrile, nontoxic, and in no acute distress. Patient is appropriate for outpatient management and is stable for discharge.  Discussed return precautions with patient. Discussed all results and patient verbalizes understanding and agrees with plan.  I personally performed the services described in this documentation, which was scribed  in my presence. The recorded information has been reviewed and is accurate.   Louann SjogrenVictoria L Talmage Teaster, PA-C 11/25/14 1404  Kristen N Ward, DO 11/25/14 1500

## 2014-11-25 NOTE — Discharge Instructions (Signed)
Return to the emergency room with worsening of symptoms, new symptoms or with symptoms that are concerning , especially fevers, loss of control of bladder or bowels, numbness or tingling around genital region or anus, weakness. RICE: Rest, Ice (three cycles of 20 mins on, 32mns off at least twice a day), compression/brace, elevation. Heating pad works well for back pain. Ibuprofen 4050m(2 tablets 20022mevery 5-6 hours for 3-5 days. Norco for severe pain . Do not operate machinery, drive or drink alcohol while taking narcotics or muscle relaxers. Further pain medications for the same back pain need to come from primary care provider. Follow up with PCP if symptoms worsen or are persistent. Read below information and follow recommendations.  Back Injury Prevention Back injuries can be extremely painful and difficult to heal. After having one back injury, you are much more likely to experience another later on. It is important to learn how to avoid injuring or re-injuring your back. The following tips can help you to prevent a back injury. PHYSICAL FITNESS  Exercise regularly and try to develop good tone in your abdominal muscles. Your abdominal muscles provide a lot of the support needed by your back.  Do aerobic exercises (walking, jogging, biking, swimming) regularly.  Do exercises that increase balance and strength (tai chi, yoga) regularly. This can decrease your risk of falling and injuring your back.  Stretch before and after exercising.  Maintain a healthy weight. The more you weigh, the more stress is placed on your back. For every pound of weight, 10 times that amount of pressure is placed on the back. DIET  Talk to your caregiver about how much calcium and vitamin D you need per day. These nutrients help to prevent weakening of the bones (osteoporosis). Osteoporosis can cause broken (fractured) bones that lead to back pain.  Include good sources of calcium in your diet, such as  dairy products, green, leafy vegetables, and products with calcium added (fortified).  Include good sources of vitamin D in your diet, such as milk and foods that are fortified with vitamin D.  Consider taking a nutritional supplement or a multivitamin if needed.  Stop smoking if you smoke. POSTURE  Sit and stand up straight. Avoid leaning forward when you sit or hunching over when you stand.  Choose chairs with good low back (lumbar) support.  If you work at a desk, sit close to your work so you do not need to lean over. Keep your chin tucked in. Keep your neck drawn back and elbows bent at a right angle. Your arms should look like the letter "L."  Sit high and close to the steering wheel when you drive. Add a lumbar support to your car seat if needed.  Avoid sitting or standing in one position for too long. Take breaks to get up, stretch, and walk around at least once every hour. Take breaks if you are driving for long periods of time.  Sleep on your side with your knees slightly bent, or sleep on your back with a pillow under your knees. Do not sleep on your stomach. LIFTING, TWISTING, AND REACHING  Avoid heavy lifting, especially repetitive lifting. If you must do heavy lifting:  Stretch before lifting.  Work slowly.  Rest between lifts.  Use carts and dollies to move objects when possible.  Make several small trips instead of carrying 1 heavy load.  Ask for help when you need it.  Ask for help when moving big, awkward objects.  Follow these  steps when lifting:  Stand with your feet shoulder-width apart.  Get as close to the object as you can. Do not try to pick up heavy objects that are far from your body.  Use handles or lifting straps if they are available.  Bend at your knees. Squat down, but keep your heels off the floor.  Keep your shoulders pulled back, your chin tucked in, and your back straight.  Lift the object slowly, tightening the muscles in your  legs, abdomen, and buttocks. Keep the object as close to the center of your body as possible.  When you put a load down, use these same guidelines in reverse.  Do not:  Lift the object above your waist.  Twist at the waist while lifting or carrying a load. Move your feet if you need to turn, not your waist.  Bend over without bending at your knees.  Avoid reaching over your head, across a table, or for an object on a high surface. OTHER TIPS  Avoid wet floors and keep sidewalks clear of ice to prevent falls.  Do not sleep on a mattress that is too soft or too hard.  Keep items that are used frequently within easy reach.  Put heavier objects on shelves at waist level and lighter objects on lower or higher shelves.  Find ways to decrease your stress, such as exercise, massage, or relaxation techniques. Stress can build up in your muscles. Tense muscles are more vulnerable to injury.  Seek treatment for depression or anxiety if needed. These conditions can increase your risk of developing back pain. SEEK MEDICAL CARE IF:  You injure your back.  You have questions about diet, exercise, or other ways to prevent back injuries. MAKE SURE YOU:  Understand these instructions.  Will watch your condition.  Will get help right away if you are not doing well or get worse. Document Released: 10/21/2004 Document Revised: 12/06/2011 Document Reviewed: 10/25/2011 [facility] Patient Information 2015 [place], [place]. This information is not intended to replace advice given to you by your health care provider. Make sure you discuss any questions you have with your health care provider.

## 2014-11-25 NOTE — ED Notes (Signed)
Per EMS pt c/o lower back pain x 2 days, pt states he has a Hx of pinched nerve and ran out of pain medication 2 days ago. Pt states he gets his pain prescriptions from the emergency department.

## 2014-12-03 ENCOUNTER — Emergency Department (HOSPITAL_COMMUNITY): Payer: Medicare Other

## 2014-12-03 ENCOUNTER — Encounter (HOSPITAL_COMMUNITY): Payer: Self-pay | Admitting: *Deleted

## 2014-12-03 ENCOUNTER — Emergency Department (HOSPITAL_COMMUNITY)
Admission: EM | Admit: 2014-12-03 | Discharge: 2014-12-03 | Disposition: A | Discharge status: Home / Self Care | Payer: Medicare Other | Attending: Emergency Medicine | Admitting: Emergency Medicine

## 2014-12-03 DIAGNOSIS — S61412A Laceration without foreign body of left hand, initial encounter: Secondary | ICD-10-CM | POA: Diagnosis not present

## 2014-12-03 DIAGNOSIS — S62309A Unspecified fracture of unspecified metacarpal bone, initial encounter for closed fracture: Secondary | ICD-10-CM

## 2014-12-03 DIAGNOSIS — M79642 Pain in left hand: Secondary | ICD-10-CM | POA: Diagnosis not present

## 2014-12-03 DIAGNOSIS — S62347A Nondisplaced fracture of base of fifth metacarpal bone. left hand, initial encounter for closed fracture: Secondary | ICD-10-CM | POA: Diagnosis not present

## 2014-12-03 DIAGNOSIS — Z79899 Other long term (current) drug therapy: Secondary | ICD-10-CM | POA: Insufficient documentation

## 2014-12-03 DIAGNOSIS — Z23 Encounter for immunization: Secondary | ICD-10-CM | POA: Diagnosis not present

## 2014-12-03 DIAGNOSIS — F209 Schizophrenia, unspecified: Secondary | ICD-10-CM | POA: Diagnosis not present

## 2014-12-03 DIAGNOSIS — Y9289 Other specified places as the place of occurrence of the external cause: Secondary | ICD-10-CM | POA: Diagnosis not present

## 2014-12-03 DIAGNOSIS — S62398A Other fracture of other metacarpal bone, initial encounter for closed fracture: Secondary | ICD-10-CM | POA: Diagnosis not present

## 2014-12-03 DIAGNOSIS — S6992XA Unspecified injury of left wrist, hand and finger(s), initial encounter: Secondary | ICD-10-CM | POA: Diagnosis not present

## 2014-12-03 DIAGNOSIS — Y9389 Activity, other specified: Secondary | ICD-10-CM | POA: Diagnosis not present

## 2014-12-03 DIAGNOSIS — Z720 Tobacco use: Secondary | ICD-10-CM | POA: Diagnosis not present

## 2014-12-03 DIAGNOSIS — Y998 Other external cause status: Secondary | ICD-10-CM | POA: Diagnosis not present

## 2014-12-03 MED ORDER — IBUPROFEN 400 MG PO TABS
600.0000 mg | ORAL_TABLET | Freq: Once | ORAL | Status: AC
  Administered 2014-12-03: 600 mg via ORAL
  Filled 2014-12-03 (×2): qty 1

## 2014-12-03 MED ORDER — IBUPROFEN 600 MG PO TABS: 600.0000 mg | ORAL_TABLET | Freq: Four times a day (QID) | ORAL | Status: DC

## 2014-12-03 MED ORDER — TETANUS-DIPHTH-ACELL PERTUSSIS 5-2.5-18.5 LF-MCG/0.5 IM SUSP
0.5000 mL | Freq: Once | INTRAMUSCULAR | Status: AC
  Administered 2014-12-03: 0.5 mL via INTRAMUSCULAR
  Filled 2014-12-03: qty 0.5

## 2014-12-03 NOTE — ED Notes (Signed)
Ortho notified

## 2014-12-03 NOTE — ED Notes (Signed)
Pt requested something to eat and drink. Nurse approved to give him a sandwich and drink.

## 2014-12-03 NOTE — Discharge Instructions (Signed)
Call and make an appointment to follow-up with the hand surgeon.  Hand Fracture, Fifth Metacarpal The small metacarpal is the bone at the base of the little finger between the knuckle and the wrist. A fracture is a break in that bone. One of the fractures that is common to this bone is called a Boxer's Fracture. TREATMENT These fractures can be treated with:   Reduction (bones moved back into place), then pinned through the skin to maintain the position, and then casted for about 6 weeks or as your caregiver determines necessary.  ORIF (open reduction and internal fixation) - the fracture site is opened and the bone pieces are fixed into place with pins and then casted for approximately 6 weeks or as your caregiver determines necessary. Your caregiver will discuss the type of fracture you have and the treatment that should be best for that problem. If surgery is the treatment of choice, the following is information for you to know, and also let your caregiver know about prior to surgery.  LET YOUR CAREGIVER KNOW ABOUT:  Allergies.  Medications taken including herbs, eye drops, over the counter medications, and creams.  Use of steroids (by mouth or creams).  Previous problems with anesthetics or novocaine.  Possibility of pregnancy, if this applies.  History of blood clots (thrombophlebitis).  History of bleeding or blood problems.  Previous surgery.  Other health problems. AFTER THE PROCEDURE After surgery, you will be taken to the recovery area where a nurse will watch and check your progress. Once you're awake, stable, and taking fluids well, barring other problems you'll be allowed to go home. Once home an ice pack applied to your operative site may help with discomfort and keep the swelling down. HOME CARE INSTRUCTIONS   Follow your caregiver's instructions as to activities, exercises, physical therapy, and driving a car.  Daily exercise is helpful for maintaining range of  motion (movement and mobility) and strength. Exercise as instructed.  To lessen swelling, keep the injured hand elevated above the level of your heart as much as possible.  Apply ice to the injury for 15-20 minutes each hour while awake for the first 2 days. Put the ice in a plastic bag and place a thin towel between the bag of ice and your cast.  Move the fingers of your casted hand at least several times a day.  If a plaster or fiberglass cast was applied:  Do not try to scratch the skin under the cast using a sharp or pointed object.  Check the skin around the cast every day. You may put lotion on red or sore areas.  Keep your cast dry. Your cast can be protected during bathing with a plastic bag. Do not put your cast into the water.  If a plaster splint was applied:  Wear the splint for as long as directed by your caregiver or until seen for follow-up examination.  Do not get your splint wet. Protect it during bathing with a plastic bag.  You may loosen the elastic bandage around the splint if your fingers start to get numb, tingle, get cold or turn blue.  Do not put pressure on your cast or splint; this may cause it to break. Especially, do not lean plaster casts on hard surfaces for 24 hours after application.  Take medications as directed by your caregiver.  Only take over-the-counter or prescription medicines for pain, discomfort, or fever as directed by your caregiver.  Follow all instructions for physician referrals, physical  therapy, and rehabilitation. Any delay in obtaining necessary care could result in permanent injury, disability and chronic pain. SEEK MEDICAL CARE IF:   Increased bleeding (more than a small spot) from the wound or from beneath your cast or splint if there is a wound beneath the cast from surgery.  Redness, swelling, or increasing pain in the wound or from beneath your cast or splint.  Pus coming from wound or from beneath your cast or  splint.  An unexplained oral temperature above 102 F (38.9 C) develops.  A foul smell coming from the wound or dressing or from beneath your cast or splint.  You are unable to move your little finger. SEEK IMMEDIATE MEDICAL CARE IF:  You develop a rash, have difficulty breathing, or have any allergy problems. If you do not have a window in your cast for observing the wound, a discharge or minor bleeding may show up as a stain on the outside of your cast. Report these findings to your caregiver. MAKE SURE YOU:   Understand these instructions.  Will watch your condition.  Will get help right away if you are not doing well or get worse. Document Released: 12/20/2000 Document Revised: 12/06/2011 Document Reviewed: 05/02/2008 [facility] Patient Information 2015 [place]. This information is not intended to replace advice given to you by your health care provider. Make sure you discuss any questions you have with your health care provider.

## 2014-12-03 NOTE — ED Notes (Signed)
Wounds cleaned with saline and dressing applied.

## 2014-12-03 NOTE — ED Notes (Signed)
Pt refused to sign to acknowledge discharge paper work

## 2014-12-03 NOTE — ED Notes (Signed)
Ortho tech at bedside 
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
## 2014-12-03 NOTE — ED Notes (Signed)
Pt states he does not know when his last tetanus shot was.

## 2014-12-03 NOTE — ED Notes (Signed)
Pts discharge paperwork given to [employer] and pt escorted off unit by [employer].
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
## 2014-12-03 NOTE — ED Notes (Signed)
MD at bedside. 

## 2014-12-03 NOTE — Progress Notes (Signed)
Orthopedic Tech Progress Note Patient Details:  Carl GoreBrad T Oborn Feb 03, 1976 [id number]  Ortho Devices Type of Ortho Device: Ulna gutter splint Ortho Device/Splint Interventions: Application   Haskell Flirtewsome, [name] 12/03/2014, 3:44 AM

## 2015-01-10 DIAGNOSIS — Z959 Presence of cardiac and vascular implant and graft, unspecified: Secondary | ICD-10-CM | POA: Diagnosis not present

## 2015-02-11 ENCOUNTER — Encounter (HOSPITAL_COMMUNITY): Payer: Self-pay | Admitting: *Deleted

## 2015-02-11 ENCOUNTER — Emergency Department (HOSPITAL_COMMUNITY)
Admission: EM | Admit: 2015-02-11 | Discharge: 2015-02-11 | Disposition: A | Discharge status: Home / Self Care | Payer: Medicare Other | Attending: Emergency Medicine | Admitting: Emergency Medicine

## 2015-02-11 DIAGNOSIS — Z8781 Personal history of (healed) traumatic fracture: Secondary | ICD-10-CM | POA: Diagnosis not present

## 2015-02-11 DIAGNOSIS — R079 Chest pain, unspecified: Secondary | ICD-10-CM | POA: Diagnosis not present

## 2015-02-11 DIAGNOSIS — F259 Schizoaffective disorder, unspecified: Secondary | ICD-10-CM | POA: Insufficient documentation

## 2015-02-11 DIAGNOSIS — Z720 Tobacco use: Secondary | ICD-10-CM | POA: Insufficient documentation

## 2015-02-11 DIAGNOSIS — Z79899 Other long term (current) drug therapy: Secondary | ICD-10-CM | POA: Diagnosis not present

## 2015-02-11 DIAGNOSIS — R45851 Suicidal ideations: Secondary | ICD-10-CM | POA: Diagnosis present

## 2015-02-11 LAB — COMPREHENSIVE METABOLIC PANEL
ALBUMIN: 4.2 g/dL (ref 3.5–5.0)
ALT: 12 U/L — ABNORMAL LOW (ref 17–63)
ANION GAP: 7 (ref 5–15)
AST: 15 U/L (ref 15–41)
Alkaline Phosphatase: 131 U/L — ABNORMAL HIGH (ref 38–126)
BUN: 7 mg/dL (ref 6–20)
CALCIUM: 9.2 mg/dL (ref 8.9–10.3)
CO2: 28 mmol/L (ref 22–32)
CREATININE: 0.8 mg/dL (ref 0.61–1.24)
Chloride: 103 mmol/L (ref 101–111)
GFR calc non Af Amer: >60 mL/min (ref >60)
GLUCOSE: 93 mg/dL (ref 65–99)
POTASSIUM: 3.4 mmol/L — AB (ref 3.5–5.1)
Sodium: 138 mmol/L (ref 135–145)
TOTAL PROTEIN: 7.7 g/dL (ref 6.5–8.1)
Total Bilirubin: 1.2 mg/dL (ref 0.3–1.2)

## 2015-02-11 LAB — CBC
HCT: 46.4 % (ref 39.0–52.0)
Hemoglobin: 15.9 g/dL (ref 13.0–17.0)
MCH: 31.8 pg (ref 26.0–34.0)
MCHC: 34.3 g/dL (ref 30.0–36.0)
MCV: 92.8 fL (ref 78.0–100.0)
PLATELETS: 183 10*3/uL (ref 150–400)
RBC: 5 MIL/uL (ref 4.22–5.81)
RDW: 13.3 % (ref 11.5–15.5)
WBC: 6.5 10*3/uL (ref 4.0–10.5)

## 2015-02-11 LAB — ETHANOL: Alcohol, Ethyl (B): <5 mg/dL (ref <5)

## 2015-02-11 LAB — ACETAMINOPHEN LEVEL

## 2015-02-11 LAB — SALICYLATE LEVEL

## 2015-02-11 MED ORDER — ZOLPIDEM TARTRATE 5 MG PO TABS: 5.0000 mg | ORAL_TABLET | Freq: Every evening | ORAL | Status: DC | PRN

## 2015-02-11 MED ORDER — IBUPROFEN 800 MG PO TABS: 800.0000 mg | ORAL_TABLET | Freq: Once | ORAL | Status: DC

## 2015-02-11 MED ORDER — ACETAMINOPHEN 325 MG PO TABS: 650.0000 mg | ORAL_TABLET | ORAL | Status: DC | PRN

## 2015-02-11 MED ORDER — LORAZEPAM 1 MG PO TABS: 1.0000 mg | ORAL_TABLET | Freq: Three times a day (TID) | ORAL | Status: DC | PRN

## 2015-02-11 MED ORDER — NICOTINE 21 MG/24HR TD PT24: 21.0000 mg | MEDICATED_PATCH | Freq: Every day | TRANSDERMAL | Status: DC

## 2015-02-11 MED ORDER — ALUM & MAG HYDROXIDE-SIMETH 200-200-20 MG/5ML PO SUSP: 30.0000 mL | ORAL | Status: DC | PRN

## 2015-02-11 MED ORDER — ONDANSETRON HCL 4 MG PO TABS: 4.0000 mg | ORAL_TABLET | Freq: Three times a day (TID) | ORAL | Status: DC | PRN

## 2015-02-11 MED ORDER — IBUPROFEN 200 MG PO TABS: 600.0000 mg | ORAL_TABLET | Freq: Three times a day (TID) | ORAL | Status: DC | PRN

## 2015-02-11 MED ORDER — PERMETHRIN 0.25 % LIQD
Freq: Once | Status: DC
  Filled 2015-02-11: qty 147.86

## 2015-02-11 NOTE — ED Notes (Addendum)
Pt presents via EMS with c/o schizophrenia. EMS was originally called to scene for chest pain. Pt reported multiple complaints, dizziness and lightheadedness as well as low back pain. Pt is schizophrenic and has medications for this. Pt reports he has not been taking his medications because he started to feel better. Pt also reporting auditory hallucinations as well depression. Pt denied SI/HI to EMS. EMS also reports a probably case of bed bugs and reports that he is homeless.

## 2015-02-11 NOTE — ED Notes (Signed)
Pt reports SI, feels like someone is going to shoot him.  He reports he did not report this to EMS because he did not want to tell EMS what was really going on.
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
## 2015-02-11 NOTE — ED Notes (Signed)
Patient refusing to change. States he is not suicidal. Patient was advised he is required to change if he wishes to receive our assistance. Patient now agrees to pain.
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
## 2015-02-11 NOTE — ED Notes (Signed)
Pt reported that he feels like someone is going to shoot him in the head.

## 2015-04-07 ENCOUNTER — Emergency Department (HOSPITAL_COMMUNITY)
Admission: EM | Admit: 2015-04-07 | Discharge: 2015-04-07 | Disposition: A | Discharge status: Home / Self Care | Payer: Medicare Other | Attending: Emergency Medicine | Admitting: Emergency Medicine

## 2015-04-07 ENCOUNTER — Encounter (HOSPITAL_COMMUNITY): Payer: Self-pay

## 2015-04-07 DIAGNOSIS — F209 Schizophrenia, unspecified: Secondary | ICD-10-CM | POA: Insufficient documentation

## 2015-04-07 DIAGNOSIS — Z8781 Personal history of (healed) traumatic fracture: Secondary | ICD-10-CM | POA: Diagnosis not present

## 2015-04-07 DIAGNOSIS — M545 Low back pain: Secondary | ICD-10-CM | POA: Diagnosis not present

## 2015-04-07 DIAGNOSIS — Z720 Tobacco use: Secondary | ICD-10-CM | POA: Insufficient documentation

## 2015-04-07 DIAGNOSIS — M5442 Lumbago with sciatica, left side: Secondary | ICD-10-CM | POA: Insufficient documentation

## 2015-04-07 DIAGNOSIS — M5432 Sciatica, left side: Secondary | ICD-10-CM

## 2015-04-07 MED ORDER — CYCLOBENZAPRINE HCL 5 MG PO TABS: 5.0000 mg | ORAL_TABLET | Freq: Three times a day (TID) | ORAL | Status: DC | PRN

## 2015-04-07 MED ORDER — IBUPROFEN 800 MG PO TABS: 800.0000 mg | ORAL_TABLET | Freq: Three times a day (TID) | ORAL | Status: DC

## 2015-04-07 NOTE — ED Notes (Signed)
Pt presents with c/o back pain. Pt reports the back pain is in the left lower area of his back radiating down his left leg. Pt has a hx of chronic back pain.

## 2015-04-07 NOTE — Discharge Instructions (Signed)
Sciatica Sciatica is pain, weakness, numbness, or tingling along the path of the sciatic nerve. The nerve starts in the lower back and runs down the back of each leg. The nerve controls the muscles in the lower leg and in the back of the knee, while also providing sensation to the back of the thigh, lower leg, and the sole of your foot. Sciatica is a symptom of another medical condition. For instance, nerve damage or certain conditions, such as a herniated disk or bone spur on the spine, pinch or put pressure on the sciatic nerve. This causes the pain, weakness, or other sensations normally associated with sciatica. Generally, sciatica only affects one side of the body. CAUSES   Herniated or slipped disc.  Degenerative disk disease.  A pain disorder involving the narrow muscle in the buttocks (piriformis syndrome).  Pelvic injury or fracture.  Pregnancy.  Tumor (rare). SYMPTOMS  Symptoms can vary from mild to very severe. The symptoms usually travel from the low back to the buttocks and down the back of the leg. Symptoms can include:  Mild tingling or dull aches in the lower back, leg, or hip.  Numbness in the back of the calf or sole of the foot.  Burning sensations in the lower back, leg, or hip.  Sharp pains in the lower back, leg, or hip.  Leg weakness.  Severe back pain inhibiting movement. These symptoms may get worse with coughing, sneezing, laughing, or prolonged sitting or standing. Also, being overweight may worsen symptoms. DIAGNOSIS  Your caregiver will perform a physical exam to look for common symptoms of sciatica. He or she may ask you to do certain movements or activities that would trigger sciatic nerve pain. Other tests may be performed to find the cause of the sciatica. These may include:  Blood tests.  X-rays.  Imaging tests, such as an MRI or CT scan. TREATMENT  Treatment is directed at the cause of the sciatic pain. Sometimes, treatment is not necessary  and the pain and discomfort goes away on its own. If treatment is needed, your caregiver may suggest:  Over-the-counter medicines to relieve pain.  Prescription medicines, such as anti-inflammatory medicine, muscle relaxants, or narcotics.  Applying heat or ice to the painful area.  Steroid injections to lessen pain, irritation, and inflammation around the nerve.  Reducing activity during periods of pain.  Exercising and stretching to strengthen your abdomen and improve flexibility of your spine. Your caregiver may suggest losing weight if the extra weight makes the back pain worse.  Physical therapy.  Surgery to eliminate what is pressing or pinching the nerve, such as a bone spur or part of a herniated disk. HOME CARE INSTRUCTIONS   Only take over-the-counter or prescription medicines for pain or discomfort as directed by your caregiver.  Apply ice to the affected area for 20 minutes, 3-4 times a day for the first 48-72 hours. Then try heat in the same way.  Exercise, stretch, or perform your usual activities if these do not aggravate your pain.  Attend physical therapy sessions as directed by your caregiver.  Keep all follow-up appointments as directed by your caregiver.  Do not wear high heels or shoes that do not provide proper support.  Check your mattress to see if it is too soft. A firm mattress may lessen your pain and discomfort. SEEK IMMEDIATE MEDICAL CARE IF:   You lose control of your bowel or bladder (incontinence).  You have increasing weakness in the lower back, pelvis, buttocks,   or legs.  You have redness or swelling of your back.  You have a burning sensation when you urinate.  You have pain that gets worse when you lie down or awakens you at night.  Your pain is worse than you have experienced in the past.  Your pain is lasting longer than 4 weeks.  You are suddenly losing weight without reason. MAKE SURE YOU:  Understand these  instructions.  Will watch your condition.  Will get help right away if you are not doing well or get worse. Document Released: 09/07/2001 Document Revised: 03/14/2012 Document Reviewed: 01/23/2012 Exit[employer] Patient Information 2015 [employer]. This information is not intended to replace advice given to you by your health care provider. Make sure you discuss any questions you have with your health care provider.  
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
## 2015-04-29 ENCOUNTER — Encounter (HOSPITAL_COMMUNITY): Payer: Self-pay

## 2015-04-29 ENCOUNTER — Emergency Department (HOSPITAL_COMMUNITY)
Admission: EM | Admit: 2015-04-29 | Discharge: 2015-04-29 | Disposition: A | Discharge status: Home / Self Care | Payer: Medicare Other | Attending: Emergency Medicine | Admitting: Emergency Medicine

## 2015-04-29 DIAGNOSIS — Z8781 Personal history of (healed) traumatic fracture: Secondary | ICD-10-CM | POA: Diagnosis not present

## 2015-04-29 DIAGNOSIS — M545 Low back pain: Secondary | ICD-10-CM | POA: Diagnosis present

## 2015-04-29 DIAGNOSIS — Z5181 Encounter for therapeutic drug level monitoring: Secondary | ICD-10-CM | POA: Diagnosis not present

## 2015-04-29 DIAGNOSIS — M5442 Lumbago with sciatica, left side: Secondary | ICD-10-CM | POA: Insufficient documentation

## 2015-04-29 DIAGNOSIS — G8929 Other chronic pain: Secondary | ICD-10-CM | POA: Diagnosis not present

## 2015-04-29 DIAGNOSIS — R200 Anesthesia of skin: Secondary | ICD-10-CM | POA: Diagnosis not present

## 2015-04-29 DIAGNOSIS — Z720 Tobacco use: Secondary | ICD-10-CM | POA: Insufficient documentation

## 2015-04-29 DIAGNOSIS — Z8659 Personal history of other mental and behavioral disorders: Secondary | ICD-10-CM | POA: Insufficient documentation

## 2015-04-29 NOTE — ED Notes (Signed)
Pt presents wanting narcotic drug detox.  Pt was in outpatient rehab x 3 months.  States he started using narcotics last 2 days b/c pinched nerve in back - pain worsened.  Rehab told him he needs detox for 2-3 days

## 2015-04-29 NOTE — ED Provider Notes (Signed)
CSN: [id number]     Arrival date & time 04/29/15  1029 History   First MD Initiated Contact with Patient 04/29/15 1252     Chief Complaint  Patient presents with  . Drug Problem     (Consider location/radiation/quality/duration/timing/severity/associated sxs/prior Treatment) HPI Comments: Patient presents with back pain. He initially told the triage nurse that he was here requesting narcotic drug detox. He states that is not true. He states he's currently in outpatient rehabilitation for crack cocaine use. He denies any inappropriate use of narcotic medications. He states that he is here to have his back evaluated and he needs pain medicine for his back. He has chronic low back pain with radiation to his left leg. He denies any weakness to the leg. He has chronic numbness to the bottom of his foot. He denies any recent injuries. He denies any urinary symptoms. There is no incontinence  Patient is a 39 y.o. male presenting with drug problem.  Drug Problem Pertinent negatives include no headaches.    Past Medical History  Diagnosis Date  . Schizophrenic disorder   . Boxer's fracture    History reviewed. No pertinent past surgical history. History reviewed. No pertinent family history. History  Substance Use Topics  . Smoking status: Current Every Day Smoker -- 1.00 packs/day for 15 years    Types: Cigarettes  . Smokeless tobacco: Never Used  . Alcohol Use: No     Comment: seldom    Review of Systems  Constitutional: Negative for fever.  Gastrointestinal: Negative for nausea and vomiting.  Musculoskeletal: Positive for back pain. Negative for joint swelling, arthralgias and neck pain.  Skin: Negative for wound.  Neurological: Positive for numbness. Negative for weakness and headaches.      Allergies  Haldol  Home Medications   Prior to Admission medications   Medication Sig Start Date End Date Taking? Authorizing Provider  OLANZapine zydis (ZYPREXA) 20 MG disintegrating  tablet Take 1 tablet (20 mg total) by mouth at bedtime. 06/20/13  Yes Thermon Leyland, NP  risperiDONE microspheres (RISPERDAL CONSTA) 25 MG injection Inject 2 mLs (25 mg total) into the muscle every 14 (fourteen) days. Patient received last dose on 06/16/13. He will receive the next injection in fourteen days from last injection. 06/19/13  Yes Thermon Leyland, NP  cyclobenzaprine (FLEXERIL) 5 MG tablet Take 1 tablet (5 mg total) by mouth 3 (three) times daily as needed for muscle spasms. Patient not taking: Reported on 04/29/2015 04/07/15   Teressa Lower, NP  ibuprofen (ADVIL,MOTRIN) 800 MG tablet Take 1 tablet (800 mg total) by mouth 3 (three) times daily. Patient not taking: Reported on 04/29/2015 04/07/15   Teressa Lower, NP   BP 131/82 mmHg  Pulse 104  Temp(Src) 98.4 F (36.9 C) (Oral)  Resp 18  SpO2 97% Physical Exam  Constitutional: He is oriented to person, place, and time. He appears well-developed and well-nourished.  HENT:  Head: Normocephalic and atraumatic.  Eyes: Pupils are equal, round, and reactive to light.  Neck: Normal range of motion. Neck supple.  Cardiovascular: Normal rate, regular rhythm and normal heart sounds.   Pulmonary/Chest: Effort normal and breath sounds normal. No respiratory distress. He has no wheezes. He has no rales. He exhibits no tenderness.  Abdominal: Soft. Bowel sounds are normal. There is no tenderness. There is no rebound and no guarding.  Musculoskeletal: Normal range of motion. He exhibits no edema.  Mild tenderness to the left lower lumbar area. No crepitus or deformity along the  spine. No tenderness along the spine. Negative straight leg raise bilaterally. He has normal motor function and sensation to light touch in the extremities bilaterally. Pedal pulses are intact.  Lymphadenopathy:    He has no cervical adenopathy.  Neurological: He is alert and oriented to person, place, and time.  Skin: Skin is warm and dry. No rash noted.  Psychiatric: He  has a normal mood and affect.    ED Course  Procedures (including critical care time) Labs Review Labs Reviewed - No data to display  Imaging Review No results found.   EKG Interpretation None      MDM   Final diagnoses:  Left-sided low back pain with left-sided sciatica    I explained to the patient that we would not be able to give him narcotic medications for his chronic back pain. He was just seen here within the month and given prescriptions for ibuprofen and Flexeril. He was discharged home in good condition. He was given outpatient resources for substance abuse follow-up should he choose to use them.    Rolan Bucco, MD 04/29/15 1352

## 2015-04-29 NOTE — Discharge Instructions (Signed)
Back Pain, Adult Back pain is very common. The pain often gets better over time. The cause of back pain is usually not dangerous. Most people can learn to manage their back pain on their own.  HOME CARE   Stay active. Start with short walks on flat ground if you can. Try to walk farther each day.  Do not sit, drive, or stand in one place for more than 30 minutes. Do not stay in bed.  Do not avoid exercise or work. Activity can help your back heal faster.  Be careful when you bend or lift an object. Bend at your knees, keep the object close to you, and do not twist.  Sleep on a firm mattress. Lie on your side, and bend your knees. If you lie on your back, put a pillow under your knees.  Only take medicines as told by your doctor.  Put ice on the injured area.  Put ice in a plastic bag.  Place a towel between your skin and the bag.  Leave the ice on for 15-20 minutes, 03-04 times a day for the first 2 to 3 days. After that, you can switch between ice and heat packs.  Ask your doctor about back exercises or massage.  Avoid feeling anxious or stressed. Find good ways to deal with stress, such as exercise. GET HELP RIGHT AWAY IF:   Your pain does not go away with rest or medicine.  Your pain does not go away in 1 week.  You have new problems.  You do not feel well.  The pain spreads into your legs.  You cannot control when you poop (bowel movement) or pee (urinate).  Your arms or legs feel weak or lose feeling (numbness).  You feel sick to your stomach (nauseous) or throw up (vomit).  You have belly (abdominal) pain.  You feel like you may pass out (faint). MAKE SURE YOU:   Understand these instructions.  Will watch your condition.  Will get help right away if you are not doing well or get worse. Document Released: 03/01/2008 Document Revised: 12/06/2011 Document Reviewed: 01/15/2014 [facility] Patient Information 2015 [place], [place]. This information is not intended  to replace advice given to you by your health care provider. Make sure you discuss any questions you have with your health care provider. Emergency Department Resource Guide 1) Find a Doctor and Pay Out of Pocket Although you won't have to find out who is covered by your insurance plan, it is a good idea to ask around and get recommendations. You will then need to call the office and see if the doctor you have chosen will accept you as a new patient and what types of options they offer for patients who are self-pay. Some doctors offer discounts or will set up payment plans for their patients who do not have insurance, but you will need to ask so you aren't surprised when you get to your appointment.  2) Contact Your Local Health Department Not all health departments have doctors that can see patients for sick visits, but many do, so it is worth a call to see if yours does. If you don't know where your local health department is, you can check in your phone book. The CDC also has a tool to help you locate your state's health department, and many state websites also have listings of all of their local health departments.  3) Find a Walk-in Clinic If your illness is not likely to be very severe  or complicated, you may want to try a walk in clinic. These are popping up all over the country in pharmacies, drugstores, and shopping centers. They're usually staffed by nurse practitioners or physician assistants that have been trained to treat common illnesses and complaints. They're usually fairly quick and inexpensive. However, if you have serious medical issues or chronic medical problems, these are probably not your best option.   Chronic Pain Problems: Organization         Address     Phone             Notes  [facility]  [phone] Patients need to be referred by their primary care doctor.   Medication Assistance: Organization         Address     Phone              Notes  [facility] Medication [facility] [address] [place], [place] [address] [phone] --Must be a resident of [facility] -- Must have NO insurance coverage whatsoever (no Medicaid/ Medicare, etc.) -- The pt. MUST have a primary care doctor that directs their care regularly and follows them in the community   [facility]ist  [phone]   [employer]  [phone]    Agencies that provide inexpensive medical care: Organization         Address     Phone             Notes  [facility]  [phone]   [facility]    [phone]   [facility] [address] [place], [place] [address] [phone]   [facility] [place] [facility] [address] [place], [place] [phone]   Planned Parenthood    [phone]   [facility]    [phone]   [facility]  [address], [place] Phone:  [phone], Fax:  [phone] Hours of Operation:  9 am - 6 pm, M-F.  Also accepts Medicaid/Medicare and self-pay.  [facility]  [address], [place] Phone: [phone], Fax: [phone]. Hours of Operation:  8:30 am - 5:30 pm, M-F.  Also accepts Medicaid and self-pay.  [facility] [place] [address] [place] Phone: [phone]   [facility]     [address] Natasha Bence [facility], [place] [phone], Ext. [phone] Mondays & Thursdays: 7-9 AM.  First 15 patients are seen on a first come, first serve basis.   [facility] [address] [place] [address] [phone] Accepts Medicaid   Medicaid-accepting [facility] Providers:  Organization         Address     Phone             Notes  [facility] [address], [place] [phone] Also accepts self-pay patients.  [facility] [address] Laurell Josephs [place],  [place]  [phone]   [facility] [address], [place] [phone]   [facility] [address] [place] [phone]   Renaye Rakers [address], [place]   [phone] Only accepts [place] Access [place] patients after they have their name applied to their card.   Self-Pay (no insurance) in [place]:  Organization         Address     Phone             Notes  Sickle Cell Patients, [facility] Internal Medicine [address] [place], [place] [phone]   [facility] [address] [place], [place] [phone]   [facility] [place]  [address], [place] [phone]   [facility]/Dr. Osei-Bonsu  [address] [place] or [address], [place] [phone] Phone number for both [place] and [place] locations is the same.  [facility] [address] [place] [phone]   [facility] [address] [place] or [address] [phone]   [facility] [address] [place] [phone], phone; [phone], fax Sees patients 1st and 3rd Saturday of every month.  Must not qualify for public or private insurance (i.e. Medicaid, Medicare, [place] Health Choice, Veterans' Benefits)  Household income should be no more than 200% of the poverty level The clinic cannot treat you if you are pregnant or think you are pregnant  Sexually transmitted diseases are not treated at the clinic.    Dental Care:  Organization         Address     Phone             Notes  [facility] Department of [facility] [address] [place], [place] [phone] Accepts children up to age 37 who are enrolled in [place] or [place] Health Choice; pregnant women with a Medicaid card; and children who have applied  for Medicaid or [place] Health Choice, but were declined, whose parents can pay a reduced fee at time of service.  [facility] Department of [facility]  [address], [place] [phone] Accepts children up to age 58 who are enrolled in [place] or [place] Health Choice; pregnant women with a Medicaid card; and children who have applied for Medicaid or [place] Health Choice, but were declined, whose parents can pay a reduced fee at time of service.  [facility]  [address] [place], [place] [phone] Patients are seen by appointment only. Walk-ins are not accepted. [facility] will see patients 24 years of age and older. Monday - Tuesday (8am-5pm) Most Wednesdays (8:30-5pm) $30 per visit, cash only  [facility] Access [facility]  [address], [facility] [phone] Patients are seen by appointment only. Walk-ins are not accepted. [facility] will see patients 74 years of age and older. One Wednesday Evening (Monthly: Volunteer Based).  $30 per visit, cash only  [employer]  [phone] for adults; Children under age 53, call Graduate Pediatric Dentistry at [phone]. Children aged 58-14, please call [phone] to request a pediatric application.  Dental services are provided in all areas of dental care including fillings, crowns and bridges, complete and partial dentures, implants, gum treatment, root canals, and extractions. Preventive care is also provided. Treatment is provided to both adults and children. Patients are selected via a lottery and there is often a waiting list.   [facility] [address], [place]  [phone] [email or web address]   [facility] [address] [place], [place] [phone], Ext. [phone] Second and Fourth Thursday of each month, opens at  6:30 AM; Clinic ends at 9 AM.  Patients are seen on a first-come first-served basis, and a limited  number are seen during each clinic.   [facility]  [address] Ether Griffins [place], [place] [phone]   Eligibility Requirements You must have lived in [place], [place], or [place] counties for at least the last three months.   You cannot be eligible for state or federal sponsored [place], including [employer], [place], or [employer].   You generally cannot be eligible for healthcare insurance through your employer.    How to apply: Eligibility screenings are held every Tuesday and Wednesday afternoon from 1:00 pm until 4:00 pm. You do not need an appointment for the interview!  [facility] [address], [place], [place] [phone]   [facility]  [phone]   [facility] Health Department  [phone]   [facility] Health Department  [phone]    Behavioral Health Resources in the Community: Intensive Outpatient Programs Organization         Address     Phone             Notes  [facility] [address] [place], [place] [phone]   [facility] Outpatient [address], [place], [place] [phone]   [facility] [address], [place], [place]  [phone]   [facility] Mental Health [address],  [place], [place] [phone] or [phone]     Substance Abuse Resources Organization         Address     Phone             Notes  Al[facility] Drug Services  [phone]   [facility]  [phone]   [facility]  [phone]   Floydene Flock  [phone]   Residential & Outpatient Substance Abuse Program  [phone]   Psychological Services Organization         Address     Phone             Notes  [facility] Behavioral Health  [phone]   [facility] Services  [phone]   [facility] Mental Health [address] [place] [phone] or [phone]    Mobile Crisis  Teams Organization         Address     Phone             Notes  [facility], Mobile Crisis Care Unit  [phone]   [facility]  [address] [place], [place] [phone]   Doristine Locks [address] [place] [phone]    Self-Help/Support Groups Organization         Address     Phone             Notes  [facility] [place] - variety of support groups  [phone]- [phone] Call for more information  Narcotics Anonymous (NA), [facility] [address], [employer] [place]  2 meetings at this location   Statistician         Address     Phone             Notes  [facility] [address],    [place]  [phone]   [facility]  [address], [place] [address], [place], [place] [phone]   [facility] [address] [place], [place] [phone] Admissions:  8am-3pm M-F  [facility] [address],    [place], [place] [phone]   [facility] [address] [place], [place], [place] [phone]   [facility] [address],  [place], [place] [phone]   [facility] - Intensive Outpatient [address] Laurell Josephs [address], [place], [place] [phone]   [facility] (Addiction Recovery Care Assoc.) [address] [place]  [place], [place] [phone] or [phone]   [facility] ([facility]) [address] [place], [place] [phone] Accepts Medicaid  [address]  [place] [phone] Substance Abuse/Addiction Treatment   [facility] Organization         Address     Phone             Notes  [facility]  [phone]   Angie Fava, PhD [address] Ervin Knack [place], [place]   [phone] or [phone]   [facility] Behavioral   [address] [place], [place] [phone]   [facility] [address], [place], [place] [phone] Insurance/Medicaid/sponsorship through [facility] [address]                                    [place], [place] [phone] Therapy/tele-psych/case  [facility] [address] [place] [address] [place], [place] [phone]    Dr. Lolly Mustache  [phone]   [facility]  [employer] [facility] Dept. 1) [address] [place] 2) [address] [place] 3)  [address], [place] [phone]  [phone]   [facility] Child Abuse Hotline 785-382-9363 or (223)[phone] (After Hours)     [facility] Resources: Abuse and Neglect Organization         Address     Phone             Notes  Child/Elder Abuse Hotline  (660)199-9651   Family Abuse Services  573-340-6649 24 hour crisis line  [facility]  [phone]   [facility] Domestic Violence Hotline  (380)[phone]    Behavioral Health & Substance Use Organization         Address     Phone             Notes  [employer] [employer]   [phone] 24 hour crisis line  [facility]  [address] [place] [phone] Monday- Friday, walk-in,  8am-8pm  [facility] Detoxification & Crisis Stabilization  [phone]   Alcoholics Anonymous [phone] [employer]  Narcotics Anonymous  (365) [phone]     Health Clinics & Urgent Care Centers Organization         Address     Phone             Notes  [facility] Health Department  [phone]   [facility]  [phone]   [facility]  [phone]   [facility]  [phone] Uninsured patients meeting eligibility requirement  [facility] Health Services      [facility]  [phone]      [facility]     Health Center  [phone]      [facility]  [phone]  [facility] Clinic   [phone]     [facility]  [phone]     Additional [facility] Organization         Address     Phone             Notes  [facility]  [phone]   [place]  [phone] Medicaid, Nutrition, Medicine Assistance, Utility Assistance  [facility] [phone]   [facility]  [phone]   [facility]  [phone] [facility]  [facility] [address]  [phone] Adult & family shelter, food, utility & rent assistance  24 Hour crisis line for those facing homelessness  [phone]   [facility]  [phone] [place], [facility] public transportation system  Homecare Providers  (334) [phone] HIV/AIDS Case Management, FREE HIV SCREEN  Medication Management  [phone] Ongoing medication assistance for patients meeting eligibility requirements  Medication Drop Box Locations: [place] Police Dept., [facility] Police Dept., [employer] Police Dept., [facility] office  Safely rid of unused medications  [employer]  [phone] Crisis assistance, medication, housing, food, utility assistance  [employer] Info.  [facility])  [phone]

## 2015-05-02 DIAGNOSIS — F250 Schizoaffective disorder, bipolar type: Secondary | ICD-10-CM | POA: Diagnosis not present

## 2015-05-05 DIAGNOSIS — F250 Schizoaffective disorder, bipolar type: Secondary | ICD-10-CM | POA: Diagnosis not present

## 2015-05-16 DIAGNOSIS — F250 Schizoaffective disorder, bipolar type: Secondary | ICD-10-CM | POA: Diagnosis not present

## 2015-05-19 ENCOUNTER — Encounter (HOSPITAL_COMMUNITY): Payer: Self-pay | Admitting: Family Medicine

## 2015-05-19 ENCOUNTER — Emergency Department (HOSPITAL_COMMUNITY)
Admission: EM | Admit: 2015-05-19 | Discharge: 2015-05-20 | Disposition: A | Discharge status: Other Acute Inpatient Hospital | Payer: Medicare Other | Attending: Emergency Medicine | Admitting: Emergency Medicine

## 2015-05-19 DIAGNOSIS — M549 Dorsalgia, unspecified: Secondary | ICD-10-CM | POA: Diagnosis not present

## 2015-05-19 DIAGNOSIS — F1410 Cocaine abuse, uncomplicated: Secondary | ICD-10-CM | POA: Insufficient documentation

## 2015-05-19 DIAGNOSIS — G8929 Other chronic pain: Secondary | ICD-10-CM | POA: Diagnosis not present

## 2015-05-19 DIAGNOSIS — Z046 Encounter for general psychiatric examination, requested by authority: Secondary | ICD-10-CM | POA: Diagnosis present

## 2015-05-19 DIAGNOSIS — Z87828 Personal history of other (healed) physical injury and trauma: Secondary | ICD-10-CM | POA: Insufficient documentation

## 2015-05-19 DIAGNOSIS — Z720 Tobacco use: Secondary | ICD-10-CM | POA: Diagnosis not present

## 2015-05-19 DIAGNOSIS — M545 Low back pain: Secondary | ICD-10-CM | POA: Insufficient documentation

## 2015-05-19 DIAGNOSIS — F259 Schizoaffective disorder, unspecified: Secondary | ICD-10-CM | POA: Diagnosis not present

## 2015-05-19 LAB — COMPREHENSIVE METABOLIC PANEL
ALT: 15 U/L — ABNORMAL LOW (ref 17–63)
AST: 27 U/L (ref 15–41)
Albumin: 4.7 g/dL (ref 3.5–5.0)
Alkaline Phosphatase: 117 U/L (ref 38–126)
Anion gap: 9 (ref 5–15)
BILIRUBIN TOTAL: 1.2 mg/dL (ref 0.3–1.2)
BUN: 11 mg/dL (ref 6–20)
CO2: 24 mmol/L (ref 22–32)
CREATININE: 0.93 mg/dL (ref 0.61–1.24)
Calcium: 9.6 mg/dL (ref 8.9–10.3)
Chloride: 106 mmol/L (ref 101–111)
GFR calc Af Amer: >60 mL/min (ref >60)
Glucose, Bld: 138 mg/dL — ABNORMAL HIGH (ref 65–99)
Potassium: 3.4 mmol/L — ABNORMAL LOW (ref 3.5–5.1)
SODIUM: 139 mmol/L (ref 135–145)
Total Protein: 8 g/dL (ref 6.5–8.1)

## 2015-05-19 LAB — CBC
HCT: 44.1 % (ref 39.0–52.0)
Hemoglobin: 15.5 g/dL (ref 13.0–17.0)
MCH: 32.6 pg (ref 26.0–34.0)
MCHC: 35.1 g/dL (ref 30.0–36.0)
MCV: 92.8 fL (ref 78.0–100.0)
PLATELETS: 232 10*3/uL (ref 150–400)
RBC: 4.75 MIL/uL (ref 4.22–5.81)
RDW: 13.7 % (ref 11.5–15.5)
WBC: 7.8 10*3/uL (ref 4.0–10.5)

## 2015-05-19 LAB — ETHANOL

## 2015-05-19 LAB — SALICYLATE LEVEL: Salicylate Lvl: <4 mg/dL (ref 2.8–30.0)

## 2015-05-19 LAB — ACETAMINOPHEN LEVEL: Acetaminophen (Tylenol), Serum: <10 ug/mL — ABNORMAL LOW (ref 10–30)

## 2015-05-19 NOTE — ED Notes (Addendum)
Pt states he feels like he does not want live anymore. He states these feelings are intermittent. He is requesting pain medication for back. Pt states he wants to rest for a while, and get a bus pass.

## 2015-05-20 ENCOUNTER — Inpatient Hospital Stay (HOSPITAL_COMMUNITY)
Admission: EM | Admit: 2015-05-20 | Discharge: 2015-05-23 | DRG: 885 | Disposition: A | Discharge status: Home / Self Care | Payer: Medicare Other | Source: Intra-hospital | Attending: Psychiatry | Admitting: Psychiatry

## 2015-05-20 ENCOUNTER — Encounter (HOSPITAL_COMMUNITY): Payer: Self-pay | Admitting: *Deleted

## 2015-05-20 DIAGNOSIS — F17210 Nicotine dependence, cigarettes, uncomplicated: Secondary | ICD-10-CM | POA: Diagnosis present

## 2015-05-20 DIAGNOSIS — I10 Essential (primary) hypertension: Secondary | ICD-10-CM | POA: Diagnosis present

## 2015-05-20 DIAGNOSIS — F259 Schizoaffective disorder, unspecified: Principal | ICD-10-CM | POA: Diagnosis present

## 2015-05-20 DIAGNOSIS — R45851 Suicidal ideations: Secondary | ICD-10-CM | POA: Diagnosis not present

## 2015-05-20 DIAGNOSIS — F251 Schizoaffective disorder, depressive type: Secondary | ICD-10-CM | POA: Insufficient documentation

## 2015-05-20 DIAGNOSIS — F1410 Cocaine abuse, uncomplicated: Secondary | ICD-10-CM | POA: Diagnosis not present

## 2015-05-20 HISTORY — DX: Essential (primary) hypertension: I10

## 2015-05-20 LAB — COMPREHENSIVE METABOLIC PANEL
ALT: 13 U/L — ABNORMAL LOW (ref 17–63)
ANION GAP: 7 (ref 5–15)
AST: 17 U/L (ref 15–41)
Albumin: 4.1 g/dL (ref 3.5–5.0)
Alkaline Phosphatase: 104 U/L (ref 38–126)
BUN: 15 mg/dL (ref 6–20)
CHLORIDE: 105 mmol/L (ref 101–111)
CO2: 28 mmol/L (ref 22–32)
Calcium: 9.1 mg/dL (ref 8.9–10.3)
Creatinine, Ser: 1.1 mg/dL (ref 0.61–1.24)
GFR calc non Af Amer: >60 mL/min (ref >60)
Glucose, Bld: 80 mg/dL (ref 65–99)
Potassium: 4 mmol/L (ref 3.5–5.1)
SODIUM: 140 mmol/L (ref 135–145)
Total Bilirubin: 0.8 mg/dL (ref 0.3–1.2)
Total Protein: 7 g/dL (ref 6.5–8.1)

## 2015-05-20 LAB — RAPID URINE DRUG SCREEN, HOSP PERFORMED
AMPHETAMINES: NOT DETECTED
Barbiturates: NOT DETECTED
Benzodiazepines: NOT DETECTED
Cocaine: POSITIVE — AB
OPIATES: NOT DETECTED
Tetrahydrocannabinol: NOT DETECTED

## 2015-05-20 MED ORDER — ACETAMINOPHEN 325 MG PO TABS: 650.0000 mg | ORAL_TABLET | Freq: Four times a day (QID) | ORAL | Status: DC | PRN

## 2015-05-20 MED ORDER — ALUM & MAG HYDROXIDE-SIMETH 200-200-20 MG/5ML PO SUSP: 30.0000 mL | ORAL | Status: DC | PRN

## 2015-05-20 MED ORDER — GABAPENTIN 300 MG PO CAPS
300.0000 mg | ORAL_CAPSULE | Freq: Three times a day (TID) | ORAL | Status: DC
  Administered 2015-05-21 – 2015-05-23 (×7): 300 mg via ORAL
  Filled 2015-05-20 (×12): qty 1

## 2015-05-20 MED ORDER — MAGNESIUM HYDROXIDE 400 MG/5ML PO SUSP: 30.0000 mL | Freq: Every day | ORAL | Status: DC | PRN

## 2015-05-20 MED ORDER — IBUPROFEN 800 MG PO TABS
ORAL_TABLET | ORAL | Status: AC
  Filled 2015-05-20: qty 1

## 2015-05-20 MED ORDER — IBUPROFEN 800 MG PO TABS
800.0000 mg | ORAL_TABLET | Freq: Three times a day (TID) | ORAL | Status: DC
  Administered 2015-05-20 – 2015-05-23 (×9): 800 mg via ORAL
  Filled 2015-05-20 (×12): qty 1

## 2015-05-20 MED ORDER — CYCLOBENZAPRINE HCL 10 MG PO TABS
5.0000 mg | ORAL_TABLET | Freq: Three times a day (TID) | ORAL | Status: DC | PRN
  Administered 2015-05-20: 5 mg via ORAL
  Filled 2015-05-20: qty 1

## 2015-05-20 MED ORDER — OLANZAPINE 10 MG PO TBDP
10.0000 mg | ORAL_TABLET | Freq: Every day | ORAL | Status: DC
  Administered 2015-05-20 – 2015-05-22 (×3): 10 mg via ORAL
  Filled 2015-05-20 (×5): qty 1

## 2015-05-20 MED ORDER — CYCLOBENZAPRINE HCL 10 MG PO TABS
10.0000 mg | ORAL_TABLET | Freq: Three times a day (TID) | ORAL | Status: DC | PRN
  Administered 2015-05-20 – 2015-05-23 (×5): 10 mg via ORAL
  Filled 2015-05-20 (×5): qty 1

## 2015-05-20 MED ORDER — TRAZODONE HCL 50 MG PO TABS
50.0000 mg | ORAL_TABLET | Freq: Every evening | ORAL | Status: DC | PRN
  Administered 2015-05-20: 50 mg via ORAL
  Filled 2015-05-20: qty 1

## 2015-05-20 NOTE — Progress Notes (Signed)
Patient attend AA group tonight.  

## 2015-05-20 NOTE — BHH Suicide Risk Assessment (Signed)
[facility] Admission Suicide Risk Assessment   Nursing information obtained from:  Patient Demographic factors:  Male, Caucasian, Low socioeconomic status, Unemployed Current Mental Status:  NA Loss Factors:  Financial problems / change in socioeconomic status Historical Factors:  Prior suicide attempts Risk Reduction Factors:  Positive coping skills or problem solving skills Total Time spent with patient: 45 minutes Principal Problem: <principal problem not specified> Diagnosis:   Patient Active Problem List   Diagnosis Date Noted  . Schizoaffective disorder [F25.9] 05/08/2013  . Schizophrenia, undifferentiated [F20.3] 11/11/2011  . Cocaine abuse [F14.10] 11/11/2011     Continued Clinical Symptoms:  Alcohol Use Disorder Identification Test Final Score (AUDIT): 0 The "Alcohol Use Disorders Identification Test", Guidelines for Use in Primary Care, Second Edition.  World Science writer [place]). Score between 0-7:  no or low risk or alcohol related problems. Score between 8-15:  moderate risk of alcohol related problems. Score between 16-19:  high risk of alcohol related problems. Score 20 or above:  warrants further diagnostic evaluation for alcohol dependence and treatment.   CLINICAL FACTORS:   Alcohol/Substance Abuse/Dependencies Schizophrenia:   Depressive state Less than 46 years old   Musculoskeletal: Strength & Muscle Tone: within normal limits Gait & Station: normal Patient leans: normal  Psychiatric Specialty Exam: Physical Exam  Review of Systems  Constitutional: Positive for malaise/fatigue.  HENT: Negative.   Eyes: Negative.   Respiratory: Negative.   Cardiovascular: Positive for palpitations.  Gastrointestinal: Negative.   Genitourinary: Negative.   Musculoskeletal: Positive for back pain.  Skin: Negative.   Neurological: Positive for dizziness and weakness.  Endo/Heme/Allergies: Negative.   Psychiatric/Behavioral: Positive for depression, hallucinations  and substance abuse. The patient is nervous/anxious and has insomnia.     There were no vitals taken for this visit.There is no weight on file to calculate BMI.  General Appearance: Fairly Groomed  Patent attorney::  Fair  Speech:  Clear and Coherent  Volume:  fluctuates  Mood:  Anxious, Depressed, Dysphoric and Irritable  Affect:  Restricted  Thought Process:  Coherent and Goal Directed  Orientation:  Full (Time, Place, and Person)  Thought Content:  symptoms events worries concerns  Suicidal Thoughts:  Yes.  without intent/plan  Homicidal Thoughts:  No  Memory:  Immediate;   Fair Recent;   Fair Remote;   Fair  Judgement:  Fair  Insight:  Present and Shallow  Psychomotor Activity:  Restlessness  Concentration:  Fair  Recall:  [employer] of Knowledge:Fair  Language: Fair  Akathisia:  No  Handed:  Right  AIMS (if indicated):     Assets:  Desire for Improvement  Sleep:     Cognition: WNL  ADL's:  Intact     COGNITIVE FEATURES THAT CONTRIBUTE TO RISK:  Closed-mindedness, Polarized thinking and Thought constriction (tunnel vision)    SUICIDE RISK:   Moderate:  Frequent suicidal ideation with limited intensity, and duration, some specificity in terms of plans, no associated intent, good self-control, limited dysphoria/symptomatology, some risk factors present, and identifiable protective factors, including available and accessible social support. 39 Y/O male known to our unit who states he had been doing OK until more recently he had a disagreement with the landlord in the place he was staying at as they wanted to get  more more money from him on top of the $400.00 they get from [place]. He left and he does not know where to go. He has been off his medications, he relapsed on cocaine and is starting to hear "some voices."  when all that start happening he started feeling hopeless helpless and was wanting to kill himself.  PLAN OF CARE: Supportive approach/coping skills                                Psychosis; will resume his injectable will have to check with the ACT as far as when he had his last shot                               Work to improve his reality testing                               Cocaine abuse; work a relapse Counsellor; will try to clarify what happened and his options in terms of where to go from here  Medical Decision Making:  Review of Psycho-Social Stressors (1), Review or order clinical lab tests (1), Review of Medication Regimen & Side Effects (2) and Review of New Medication or Change in Dosage (2)  I certify that inpatient services furnished can reasonably be expected to improve the patient's condition.   Maurica Omura A 05/20/2015, 7:03 PM

## 2015-05-20 NOTE — BH Assessment (Addendum)
Tele Assessment Note   [name] is an 39 y.o. male presenting to [facility] reporting passive suicidal ideations. Pt stated "I don't want to be living". "I don't want to be here". "I am getting tired of coming to the hospital even though I haven't been admitted in 2 years". Pt reported that he gets biweekly injections and has missed his last injection. Pt reported that he is diagnosed with paranoid schizophrenia.  Pt denies HI and AVH at this time. Pt denied any alcohol or illicit substance use but his UDS is positive for cocaine. Pt did not report any current mental health service and reported that his services ended 1 month ago "because they thought I was doing better". Inpatient treatment is recommended.   Axis I: Schizoaffective Disorder  Past Medical History:  Past Medical History  Diagnosis Date  . Schizophrenic disorder   . Boxer's fracture     History reviewed. No pertinent past surgical history.  Family History: History reviewed. No pertinent family history.  Social History:  reports that he has been smoking Cigarettes.  He has a 15 pack-year smoking history. He has never used smokeless tobacco. He reports that he does not drink alcohol or use illicit drugs.  Additional Social History:  Alcohol / Drug Use History of alcohol / drug use?: No history of alcohol / drug abuse  CIWA: CIWA-Ar BP: 123/78 mmHg Pulse Rate: 103 COWS:    PATIENT STRENGTHS: (choose at least two) Average or above average intelligence Motivation for treatment/growth  Allergies:  Allergies  Allergen Reactions  . Haldol [Haloperidol Decanoate] Other (See Comments)    'I curl up"    Home Medications:  (Not in a hospital admission)  OB/GYN Status:  No LMP for male patient.  General Assessment Data Location of Assessment: [facility] TTS Assessment: In system Is this a Tele or Face-to-Face Assessment?: Face-to-Face Is this an Initial Assessment or a Re-assessment for this encounter?: Initial  Assessment Marital status: Single Living Arrangements: Alone Can pt return to current living arrangement?: Yes Admission Status: Voluntary Is patient capable of signing voluntary admission?: Yes Referral Source: Self/Family/Friend Insurance type: Medicare     Crisis Care Plan Living Arrangements: Alone Name of Psychiatrist: No provider reported at this time.  Name of Therapist: No provider reported at this time.   Education Status Is patient currently in school?: No Current Grade: N/A Highest grade of school patient has completed: 9 Name of school: N/A Contact person: N/A  Risk to self with the past 6 months Suicidal Ideation:  ("I don't know" ) Has patient been a risk to self within the past 6 months prior to admission? : No Suicidal Intent: No Has patient had any suicidal intent within the past 6 months prior to admission? : No Is patient at risk for suicide?: No Suicidal Plan?: No Has patient had any suicidal plan within the past 6 months prior to admission? : No Access to Means: No What has been your use of drugs/alcohol within the last 12 months?: No alcohol or drug use reported.  Previous Attempts/Gestures: No How many times?: 0 Other Self Harm Risks: No self harm risk reported  Triggers for Past Attempts: None known Intentional Self Injurious Behavior: None Family Suicide History: No Recent stressful life event(s):  (No stressors reported ) Persecutory voices/beliefs?: No Depression: Yes Depression Symptoms: Insomnia, Despondent, Tearfulness, Guilt, Feeling angry/irritable, Feeling worthless/self pity, Loss of interest in usual pleasures, Isolating, Fatigue Substance abuse history and/or treatment for substance abuse?: No Suicide prevention  information given to non-admitted patients: Not applicable  Risk to Others within the past 6 months Homicidal Ideation: No Thoughts of Harm to Others: No Current Homicidal Intent: No Current Homicidal Plan: No Access to  Homicidal Means: No Identified Victim: N/A History of harm to others?: No Assessment of Violence: On admission Violent Behavior Description: No violent behaviors observed. PT is calm and cooperative.  Does patient have access to weapons?: No Criminal Charges Pending?: No Does patient have a court date: No Is patient on probation?: No  Psychosis Hallucinations: None noted Delusions: None noted  Mental Status Report Appearance/Hygiene: Unremarkable Eye Contact: Poor Motor Activity: Unable to assess Speech: Soft Level of Consciousness: Drowsy Mood: Euthymic Affect: Appropriate to circumstance Anxiety Level: Minimal Thought Processes: Coherent, Relevant Judgement: Unable to Assess Orientation: Appropriate for developmental age Obsessive Compulsive Thoughts/Behaviors: None  Cognitive Functioning Concentration: Normal Memory: Recent Intact, Remote Intact IQ: Average Insight: Good Impulse Control: Good Appetite: Good Weight Loss: 0 Weight Gain: 0 Sleep: No Change Total Hours of Sleep: 8 Vegetative Symptoms: None  ADLScreening [facility]) Patient's cognitive ability adequate to safely complete daily activities?: Yes Patient able to express need for assistance with ADLs?: Yes Independently performs ADLs?: Yes (appropriate for developmental age)  Prior Inpatient Therapy Prior Inpatient Therapy: Yes Prior Therapy Dates: 2011, 2012, 2013, 2014 Prior Therapy Facilty/Provider(s): [facility]  Reason for Treatment: Schizophrenia   Prior Outpatient Therapy Prior Outpatient Therapy: Yes Prior Therapy Dates: 2016 Prior Therapy Facilty/Provider(s):  (Pt unablet to recall provider name at this time.) Reason for Treatment: Schizophrenia  Does patient have an ACCT team?: No Does patient have Intensive In-House Services?  : No Does patient have [facility] services? : No Does patient have P4CC services?: No  ADL Screening (condition at time of admission) Patient's  cognitive ability adequate to safely complete daily activities?: Yes Patient able to express need for assistance with ADLs?: Yes Independently performs ADLs?: Yes (appropriate for developmental age)       Abuse/Neglect Assessment (Assessment to be complete while patient is alone) Physical Abuse: Denies Verbal Abuse: Denies Sexual Abuse: Denies Exploitation of patient/patient's resources: Denies Self-Neglect: Denies     Merchant navy officer (For Healthcare) Does patient have an advance directive?: No Would patient like information on creating an advanced directive?: No - patient declined information    Additional Information 1:1 In Past 12 Months?: No CIRT Risk: No Elopement Risk: No Does patient have medical clearance?: Yes (Labs pending)     Disposition:  Disposition Initial Assessment Completed for this Encounter: Yes Disposition of Patient: Inpatient treatment program Type of inpatient treatment program: Adult  Nelli Swalley S 05/20/2015 2:01 AM

## 2015-05-20 NOTE — Progress Notes (Signed)
Recreation Therapy Notes  Animal-Assisted Activity (AAA) Program Checklist/Progress Notes Patient Eligibility Criteria Checklist & Daily Group note for Rec Tx Intervention  Date: 08.23.2016 Time: 2:45pm Location: 400 Hall Dayroom    AAA/T Program Assumption of Risk Form signed by Patient/ or Parent Legal Guardian yes  Patient is free of allergies or sever asthma yes  Patient reports no fear of animals yes  Patient reports no history of cruelty to animals yes  Patient understands his/her participation is voluntary yes  Patient washes hands before animal contact yes  Patient washes hands after animal contact yes  Behavioral Response: Did not attend.   Trinidee Schrag L Lovelace Cerveny, LRT/CTRS  Bernyce Brimley L 05/20/2015 3:03 PM 

## 2015-05-20 NOTE — Progress Notes (Signed)
Writer introduced self to pt. Pt in bed resting. Pt denied any concerns he wished for this writer to address at this time. Pt to be provided with breakfast at bedside due to late arrival on the unit. Pt informed that no medications were due at this time. Continued support and availability as needed was extended to this pt. Pt remains safe at this time.

## 2015-05-20 NOTE — Progress Notes (Signed)
Patient ID: [name], male   DOB: 11-Jan-1976, 39 y.o.   MRN: [id number] Reports no relief with the Flexeril, and thinks maybe the pain in his back is related to kidney problems because he drinks a lot of soda. Also, complained of a lot of anxiety and wants his Ativan back. Will note it for Dr to determine, and encouraged him to make his needs known. Gave him a ice back and encouraged to go lie down for his back pain.

## 2015-05-20 NOTE — ED Notes (Signed)
Report given to [facility] RN, [place] called for transport

## 2015-05-20 NOTE — [facility] Group Notes (Signed)
[facility] LCSW Group Therapy  05/20/2015 11:31 AM  Type of Therapy:  Group Therapy  Participation Level:  Did Not Attend-chose to remain in room.   Modes of Intervention:  Discussion, Education, Exploration, Problem-solving, Rapport Building, Socialization and Support  Summary of Progress/Problems: MHA Speaker came to talk about his personal journey with substance abuse and addiction. The pt processed ways by which to relate to the speaker. MHA speaker provided handouts and educational information pertaining to groups and services offered by the [facility].   Smart, Keyron Pokorski LCSWA  05/20/2015, 11:31 AM

## 2015-05-20 NOTE — [facility] Group Notes (Signed)
[facility] Group Notes:  (Nursing/MHT/Case Management/Adjunct)  Date:  05/20/2015  Time:  10:41 AM  Type of Therapy:  Nurse Education  Participation Level:  Did Not Attend  Participation Quality:  did not attend  Affect:  Did not attend  Cognitive:  asleep and did not attend group  Insight:  Limited  Engagement in Group:  asleep  Modes of Intervention:  Discussion and Education  Summary of Progress/Problems: Theme of group is recovery. He was admitted early this am and is sleeping. He would be more appropriate on the 500 hall based on his presentation but no beds are available at this time. Recovery from drugs and alcohol is not reason for his admission. Wynona Luna 05/20/2015, 10:41 AM

## 2015-05-20 NOTE — Progress Notes (Signed)
Pt has been observed sitting in the dayroom most of the evening.  He has made several phone calls, he says some of then were to his landlord.  He denies SI/HI/AVH at this time.  He says he needs to get back on his medications.  He reports that he attended some of the groups this afternoon.  He is unsure what his discharge plans are.  Pt's main focus is his pain, reporting chronic back pain.  He was informed that his Flexeril was increased this evening.  He also requests something to help him sleep.  Pt is polite and cooperative with Clinical research associate.  He makes his needs known to staff.  Support and encouragement offered.  Pt was encouraged to participate in his treatment.  Safety maintained with q15 minute checks.

## 2015-05-20 NOTE — ED Notes (Signed)
Pt accepted to rm 304-1

## 2015-05-20 NOTE — Progress Notes (Signed)
Patient ID: [name], male   DOB: [birth year], 39 y.o.   MRN: [id number] PER STATE REGULATIONS 482.30  THIS CHART WAS REVIEWED FOR MEDICAL NECESSITY WITH RESPECT TO THE PATIENT'S ADMISSION/DURATION OF STAY.  NEXT REVIEW DATE:05/24/15  Loura Halt, RN, BSN CASE MANAGER

## 2015-05-20 NOTE — ED Provider Notes (Signed)
CSN: [id number]     Arrival date & time 05/19/15  2146 History   First MD Initiated Contact with Patient 05/20/15 0023     Chief Complaint  Patient presents with  . Psychiatric Evaluation     (Consider location/radiation/quality/duration/timing/severity/associated sxs/prior Treatment) HPI Comments: 39 year old male presents to the emergency department for multiple complaints. He states that he wants to speak to someone because he is having feelings of not wanting to live anymore. He denies any plans to harm himself. He cannot indicate to me what caused his symptoms of depression. Patient complains of his chronic low back pain. He denies any aggravating factors of his symptoms. He reports that his pain is intermittent.Patient denies any fever or bowel/bladder incontinence. He denies any new falls or injuries since he was last seen earlier this month.  The history is provided by the patient. No language interpreter was used.    Past Medical History  Diagnosis Date  . Schizophrenic disorder   . Boxer's fracture    History reviewed. No pertinent past surgical history. History reviewed. No pertinent family history. Social History  Substance Use Topics  . Smoking status: Current Every Day Smoker -- 1.00 packs/day for 15 years    Types: Cigarettes  . Smokeless tobacco: Never Used  . Alcohol Use: No     Comment: seldom    Review of Systems  Constitutional: Negative for fever.  Genitourinary:       Negative for incontinence  Musculoskeletal: Positive for back pain.  Psychiatric/Behavioral: Positive for behavioral problems. Negative for sleep disturbance.  All other systems reviewed and are negative.   Allergies  Haldol  Home Medications   Prior to Admission medications   Medication Sig Start Date End Date Taking? Authorizing Provider  OLANZapine zydis (ZYPREXA) 20 MG disintegrating tablet Take 1 tablet (20 mg total) by mouth at bedtime. 06/20/13  Yes Thermon Leyland, NP   risperiDONE microspheres (RISPERDAL CONSTA) 25 MG injection Inject 2 mLs (25 mg total) into the muscle every 14 (fourteen) days. Patient received last dose on 06/16/13. He will receive the next injection in fourteen days from last injection. 06/19/13  Yes Thermon Leyland, NP  cyclobenzaprine (FLEXERIL) 5 MG tablet Take 1 tablet (5 mg total) by mouth 3 (three) times daily as needed for muscle spasms. Patient not taking: Reported on 04/29/2015 04/07/15   Teressa Lower, NP  ibuprofen (ADVIL,MOTRIN) 800 MG tablet Take 1 tablet (800 mg total) by mouth 3 (three) times daily. Patient not taking: Reported on 04/29/2015 04/07/15   Teressa Lower, NP   BP 118/88 mmHg  Pulse 89  Temp(Src) 98.6 F (37 C) (Oral)  Resp 18  SpO2 99%   Physical Exam  Constitutional: He is oriented to person, place, and time. He appears well-developed and well-nourished. No distress.  Nontoxic/nonseptic appearing  HENT:  Head: Normocephalic and atraumatic.  Eyes: Conjunctivae and EOM are normal. No scleral icterus.  Neck: Normal range of motion.  Cardiovascular: Regular rhythm and intact distal pulses.   DP and PT pulses 2+ bilaterally  Pulmonary/Chest: Effort normal. No respiratory distress.  Respirations even and unlabored  Musculoskeletal: Normal range of motion. He exhibits tenderness.  Tenderness to palpation of the right lumbar paraspinal muscles. No bony deformities, step-offs, or crepitus to the thoracic or lumbar midline.  Neurological: He is alert and oriented to person, place, and time. He exhibits normal muscle tone. Coordination normal.  Patient ambulatory in the ED without difficulty.  Skin: Skin is warm and dry. No rash  noted. He is not diaphoretic. No erythema. No pallor.  Psychiatric: His behavior is normal.  Mildly depressed mood. No SI.  Nursing note and vitals reviewed.   ED Course  Procedures (including critical care time) Labs Review Labs Reviewed  COMPREHENSIVE METABOLIC PANEL - Abnormal;  Notable for the following:    Potassium 3.4 (*)    Glucose, Bld 138 (*)    ALT 15 (*)    All other components within normal limits  ACETAMINOPHEN LEVEL - Abnormal; Notable for the following:    Acetaminophen (Tylenol), Serum <10 (*)    All other components within normal limits  URINE RAPID DRUG SCREEN, HOSP PERFORMED - Abnormal; Notable for the following:    Cocaine POSITIVE (*)    All other components within normal limits  ETHANOL  SALICYLATE LEVEL  CBC    Imaging Review No results found.   I have personally reviewed and evaluated these images and lab results as part of my medical decision-making.   EKG Interpretation None      MDM   Final diagnoses:  Schizoaffective disorder, unspecified type    39 year old male presents to the Emergency Department complaining of symptoms consistent with depression. He is also complaining of back pain which is chronic. No red flags or signs concerning for cauda equina. No further emergent workup indicated for back pain. He requests evaluation by TTS. TTS believes that the patient meets inpatient criteria for schizoaffective disorder. Will transfer to behavioral health for further psychiatric care. Patient medically cleared prior to transfer.   Filed Vitals:   05/19/15 2152 05/20/15 0251  BP: 123/78 118/88  Pulse: 103 89  Temp: 98.6 F (37 C)   TempSrc: Oral   Resp: 18 18  SpO2: 99% 99%     Antony Madura, PA-C 05/20/15 0435  April Palumbo, MD 05/20/15 [phone]

## 2015-05-20 NOTE — Tx Team (Addendum)
Initial Interdisciplinary Treatment Plan   PATIENT STRESSORS: Financial difficulties Occupational concerns   PATIENT STRENGTHS: Ability for insight Capable of independent living Motivation for treatment/growth Physical Health   PROBLEM LIST: Problem List/Patient Goals Date to be addressed Date deferred Reason deferred Estimated date of resolution  Depression      Suicidal ideations      "I need some depression meds"      Substance abuse                                     DISCHARGE CRITERIA:  Ability to meet basic life and health needs Adequate post-discharge living arrangements Improved stabilization in mood, thinking, and/or behavior Motivation to continue treatment in a less acute level of care Need for constant or close observation no longer present Reduction of life-threatening or endangering symptoms to within safe limits Safe-care adequate arrangements made Verbal commitment to aftercare and medication compliance  PRELIMINARY DISCHARGE PLAN: Outpatient therapy Return to previous living arrangement  PATIENT/FAMIILY INVOLVEMENT: This treatment plan has been presented to and reviewed with the patient, [name].  The patient and family have been given the opportunity to ask questions and make suggestions.  Arturo Morton 05/20/2015, 6:11 AM

## 2015-05-20 NOTE — Progress Notes (Addendum)
Patient ID: SAVA PROBY, male   DOB: Apr 27, 1976, 39 y.o.   MRN: [id number] 39yo male who presents voluntarily and in no acute distress for the treatment of Depression and SI. he is calm and cooperative, but drowsy. He is disheveled. Currently he denies SI/HI and AVH and contracts for safety. He is positive for multiple symptoms of depression (helplessness, hopelessness and anhedonia). States he has been compliant with his medications. States he wants to get "on some depression meds". Denies ETOH and/or drug abuse (UDS positive for cocaine). He endorses smoking, but refuses smoking cessation materials or aids. See admission assessment for greater medical detail. Skin assessed and no breakdown noted.  Denied pain. Belongings secured in pt locker 8. Unit policies and expectations reviewed and understanding verbalized. Consents obtained. Escorted to unit and oriented by MHT. He offered no additional questions or concerns. Q15 minute safety checks initiated upon arival. Will continue current POC and q15 minute checks for safety.

## 2015-05-20 NOTE — Progress Notes (Signed)
Patient ID: [name], male   DOB: 04-22-1976, 39 y.o.   MRN: [id number] D-Slept most of shift. He did get up for meals but otherwise has remained in the bed. He was admitted early this am possibly why he is so tired. He has a flat affect and minimal verbalization. He has chronic back pain and is ordered routine Ibuprofen for it.  A-Monitor for safety and medications as ordered Support offered. R-No complaints other than pain but couldn't identify level of pain when asked. He is probably limited cognitively. Offers littld.

## 2015-05-20 NOTE — BH Assessment (Signed)
Pt has been accepted to [facility] Room 304-1 to Dr. Dub Mikes. Antony Madura, PA-C has been informed of the disposition.

## 2015-05-20 NOTE — H&P (Signed)
Psychiatric Admission Assessment Adult  Patient Identification: [name] MRN:  [id number]  Date of Evaluation:  05/20/2015  Chief Complaint:  Schizoaffective Disorder  Diagnosis: Worsening symptoms of Schizoaffective disorder   Diagnosis:   Patient Active Problem List   Diagnosis Date Noted  . Schizoaffective disorder [F25.9] 05/08/2013  . Schizophrenia, undifferentiated [F20.3] 11/11/2011  . Cocaine abuse [F14.10] 11/11/2011   History of Present Illness: [name] is a 39 year old Caucasian Caucasian male. Admitted to [facility] with complaints of anxiety attacks. He reports, "I was having anxiety attacks. I don't know what the trigger is & don't know how long it has been going on. I'm also depressed, I just don't know how long. I don't know how much help you can give me here. I just need my Risperdal injection. I got it last about a month ago. My ACT Team gave it to me. I get the injection every 3 weeks. It is for Paranoid Schizophrenia. I did not get my last injection because I do not see my ACT Team no more. I feel tired & irritable, can you leave me alone. I need to rest. Hey, I also take Olanzapine. I have not taken it in 2 months"  [name] is alert, oriented to name & place. He appears intellectually limited & presents as a poor historian. He was on this unit in September of 2015 under the care of Dr. Darleene Cleaver.  Elements:  Location:  Schizoaffective disorder, Schizophrenia. Quality:  Suicidal ideations, mood swings. Severity:  Severe. Timing:  Current & acute. Duration:  Chronic mental illness. Context:  Hx: Schizoaffective disorder.  Associated Signs/Symptoms:  Depression Symptoms:  depressed mood, insomnia, psychomotor agitation, fatigue, anxiety,  (Hypo) Manic Symptoms:  Irritable Mood, Labiality of Mood,  Anxiety Symptoms:  Excessive Worry, Panic Symptoms,  Psychotic Symptoms:  Patient denies  PTSD Symptoms: NA  Total Time spent with patient: 1 hour  Past Medical  History:  Past Medical History  Diagnosis Date  . Schizophrenic disorder   . Boxer's fracture   . Hypertension    History reviewed. No pertinent past surgical history.  Family History: History reviewed. No pertinent family history.  Social History:  History  Alcohol Use No    Comment: seldom     History  Drug Use No    Social History   Social History  . Marital Status: Single    Spouse Name: N/A  . Number of Children: N/A  . Years of Education: N/A   Social History Main Topics  . Smoking status: Current Every Day Smoker -- 1.00 packs/day for 15 years    Types: Cigarettes  . Smokeless tobacco: Never Used  . Alcohol Use: No     Comment: seldom  . Drug Use: No  . Sexual Activity: No   Other Topics Concern  . None   Social History Narrative   ** Merged History Encounter **       Additional Social History:  Musculoskeletal: Strength & Muscle Tone: within normal limits Gait & Station: normal Patient leans: N/A  Psychiatric Specialty Exam: Physical Exam  Constitutional: He is oriented to person, place, and time. He appears well-developed and well-nourished.  HENT:  Head: Normocephalic.  Eyes: Pupils are equal, round, and reactive to light.  Neck: Normal range of motion.  Cardiovascular: Normal rate.   Respiratory: Effort normal.  GI: Soft. Bowel sounds are normal.  Genitourinary:  Denies any issues  Musculoskeletal: Normal range of motion.  Neurological: He is alert and oriented to person, place,  and time.  Skin: Skin is warm and dry.    Review of Systems  Constitutional: Positive for malaise/fatigue.  HENT: Negative.   Eyes: Negative.   Respiratory: Negative.   Cardiovascular: Negative.   Gastrointestinal: Negative.   Genitourinary: Negative.   Skin: Negative.   Neurological: Positive for weakness.  Psychiatric/Behavioral: Positive for depression, suicidal ideas, hallucinations (Hx of), memory loss and substance abuse (Cocaine abuse). The patient  has insomnia.     There were no vitals taken for this visit.There is no weight on file to calculate BMI.  General Appearance: Disheveled  Eye Contact::  Poor  Speech:  Clear and Coherent  Volume:  Decreased  Mood:  Depressed and Irritable  Affect:  Labile  Thought Process:  Disorganized and illogical at times  Orientation:  Other:  Oeirnted to name & place  Thought Content:  Rumination  Suicidal Thoughts:  Yes.  without intent/plan  Homicidal Thoughts:  No  Memory:  Immediate;   Poor Recent;   Poor Remote;   Poor  Judgement:  Impaired  Insight:  Fair  Psychomotor Activity:  Decreased  Concentration:  Poor  Recall:  Poor  Fund of Knowledge:Poor  Language: Fair  Akathisia:  No  Handed:  Right  AIMS (if indicated):     Assets:  Desire for Improvement  ADL's:  Impaired  Cognition: Impaired,  Moderate  Sleep:      Risk to Self: Is patient at risk for suicide?: Yes Risk to Others: No Prior Inpatient Therapy: Yes Prior Outpatient Therapy: Yes  Alcohol Screening: 1. How often do you have a drink containing alcohol?: Never 2. How many drinks containing alcohol do you have on a typical day when you are drinking?: 1 or 2 3. How often do you have six or more drinks on one occasion?: Never Preliminary Score: 0 4. How often during the last year have you found that you were not able to stop drinking once you had started?: Never 5. How often during the last year have you failed to do what was normally expected from you becasue of drinking?: Never 6. How often during the last year have you needed a first drink in the morning to get yourself going after a heavy drinking session?: Never 7. How often during the last year have you had a feeling of guilt of remorse after drinking?: Never 8. How often during the last year have you been unable to remember what happened the night before because you had been drinking?: Never 9. Have you or someone else been injured as a result of your drinking?:  No 10. Has a relative or friend or a doctor or another health worker been concerned about your drinking or suggested you cut down?: No Alcohol Use Disorder Identification Test Final Score (AUDIT): 0 Brief Intervention: AUDIT score less than 7 or less-screening does not suggest unhealthy drinking-brief intervention not indicated  Allergies:   Allergies  Allergen Reactions  . Haldol [Haloperidol Decanoate] Other (See Comments)    'I curl up"   Lab Results:  Results for orders placed or performed during the hospital encounter of 05/19/15 (from the past 48 hour(s))  Comprehensive metabolic panel     Status: Abnormal   Collection Time: 05/19/15 10:24 PM  Result Value Ref Range   Sodium 139 135 - 145 mmol/L   Potassium 3.4 (L) 3.5 - 5.1 mmol/L   Chloride 106 101 - 111 mmol/L   CO2 24 22 - 32 mmol/L   Glucose, Bld 138 (H) 65 -  99 mg/dL   BUN 11 6 - 20 mg/dL   Creatinine, Ser 0.93 0.61 - 1.24 mg/dL   Calcium 9.6 8.9 - 10.3 mg/dL   Total Protein 8.0 6.5 - 8.1 g/dL   Albumin 4.7 3.5 - 5.0 g/dL   AST 27 15 - 41 U/L   ALT 15 (L) 17 - 63 U/L   Alkaline Phosphatase 117 38 - 126 U/L   Total Bilirubin 1.2 0.3 - 1.2 mg/dL   GFR calc non Af Amer >60 >60 mL/min   GFR calc Af Amer >60 >60 mL/min    Comment: (NOTE) The eGFR has been calculated using the CKD EPI equation. This calculation has not been validated in all clinical situations. eGFR's persistently <60 mL/min signify possible Chronic Kidney Disease.    Anion gap 9 5 - 15  CBC     Status: None   Collection Time: 05/19/15 10:24 PM  Result Value Ref Range   WBC 7.8 4.0 - 10.5 K/uL   RBC 4.75 4.22 - 5.81 MIL/uL   Hemoglobin 15.5 13.0 - 17.0 g/dL   HCT 44.1 39.0 - 52.0 %   MCV 92.8 78.0 - 100.0 fL   MCH 32.6 26.0 - 34.0 pg   MCHC 35.1 30.0 - 36.0 g/dL   RDW 13.7 11.5 - 15.5 %   Platelets 232 150 - 400 K/uL  Ethanol (ETOH)     Status: None   Collection Time: 05/19/15 10:25 PM  Result Value Ref Range   Alcohol, Ethyl (B) <5 <5  mg/dL    Comment:        LOWEST DETECTABLE LIMIT FOR SERUM ALCOHOL IS 5 mg/dL FOR MEDICAL PURPOSES ONLY   Salicylate level     Status: None   Collection Time: 05/19/15 10:25 PM  Result Value Ref Range   Salicylate Lvl <7.4 2.8 - 30.0 mg/dL  Acetaminophen level     Status: Abnormal   Collection Time: 05/19/15 10:25 PM  Result Value Ref Range   Acetaminophen (Tylenol), Serum <10 (L) 10 - 30 ug/mL    Comment:        THERAPEUTIC CONCENTRATIONS VARY SIGNIFICANTLY. A RANGE OF 10-30 ug/mL MAY BE AN EFFECTIVE CONCENTRATION FOR MANY PATIENTS. HOWEVER, SOME ARE BEST TREATED AT CONCENTRATIONS OUTSIDE THIS RANGE. ACETAMINOPHEN CONCENTRATIONS >150 ug/mL AT 4 HOURS AFTER INGESTION AND >50 ug/mL AT 12 HOURS AFTER INGESTION ARE OFTEN ASSOCIATED WITH TOXIC REACTIONS.   Urine rapid drug screen (hosp performed) (Not at [facility])     Status: Abnormal   Collection Time: 05/20/15  3:46 AM  Result Value Ref Range   Opiates NONE DETECTED NONE DETECTED   Cocaine POSITIVE (A) NONE DETECTED   Benzodiazepines NONE DETECTED NONE DETECTED   Amphetamines NONE DETECTED NONE DETECTED   Tetrahydrocannabinol NONE DETECTED NONE DETECTED   Barbiturates NONE DETECTED NONE DETECTED    Comment:        DRUG SCREEN FOR MEDICAL PURPOSES ONLY.  IF CONFIRMATION IS NEEDED FOR ANY PURPOSE, NOTIFY LAB WITHIN 5 DAYS.        LOWEST DETECTABLE LIMITS FOR URINE DRUG SCREEN Drug Class       Cutoff (ng/mL) Amphetamine      1000 Barbiturate      200 Benzodiazepine   259 Tricyclics       563 Opiates          300 Cocaine          300 THC              50  Current Medications: Current Facility-Administered Medications  Medication Dose Route Frequency Provider Last Rate Last Dose  . acetaminophen (TYLENOL) tablet 650 mg  650 mg Oral Q6H PRN Evanna Burkett, NP      . alum & mag hydroxide-simeth (MAALOX/MYLANTA) 200-200-20 MG/5ML suspension 30 mL  30 mL Oral Q4H PRN Evanna Burkett, NP      . magnesium hydroxide (MILK  OF MAGNESIA) suspension 30 mL  30 mL Oral Daily PRN Evanna Burkett, NP      . traZODone (DESYREL) tablet 50 mg  50 mg Oral QHS PRN,MR X 1 Evanna Burkett, NP       PTA Medications: Prescriptions prior to admission  Medication Sig Dispense Refill Last Dose  . cyclobenzaprine (FLEXERIL) 5 MG tablet Take 1 tablet (5 mg total) by mouth 3 (three) times daily as needed for muscle spasms. (Patient not taking: Reported on 04/29/2015) 20 tablet 0 Not Taking at Unknown time  . ibuprofen (ADVIL,MOTRIN) 800 MG tablet Take 1 tablet (800 mg total) by mouth 3 (three) times daily. (Patient not taking: Reported on 04/29/2015) 21 tablet 0 Completed Course at Unknown time  . OLANZapine zydis (ZYPREXA) 20 MG disintegrating tablet Take 1 tablet (20 mg total) by mouth at bedtime. 30 tablet 0 Past Week at Unknown time  . risperiDONE microspheres (RISPERDAL CONSTA) 25 MG injection Inject 2 mLs (25 mg total) into the muscle every 14 (fourteen) days. Patient received last dose on 06/16/13. He will receive the next injection in fourteen days from last injection. 1 each 0 Past Month at Unknown time   Previous Psychotropic Medications: Yes   Substance Abuse History in the last 12 months:  Yes.    Consequences of Substance Abuse: Medical Consequences:  Liver damage, Possible death by overdose Legal Consequences:  Arrests, jail time, Loss of driving privilege. Family Consequences:  Family discord, divorce and or separation.  Results for orders placed or performed during the hospital encounter of 05/19/15 (from the past 72 hour(s))  Comprehensive metabolic panel     Status: Abnormal   Collection Time: 05/19/15 10:24 PM  Result Value Ref Range   Sodium 139 135 - 145 mmol/L   Potassium 3.4 (L) 3.5 - 5.1 mmol/L   Chloride 106 101 - 111 mmol/L   CO2 24 22 - 32 mmol/L   Glucose, Bld 138 (H) 65 - 99 mg/dL   BUN 11 6 - 20 mg/dL   Creatinine, Ser 0.93 0.61 - 1.24 mg/dL   Calcium 9.6 8.9 - 10.3 mg/dL   Total Protein 8.0 6.5 - 8.1  g/dL   Albumin 4.7 3.5 - 5.0 g/dL   AST 27 15 - 41 U/L   ALT 15 (L) 17 - 63 U/L   Alkaline Phosphatase 117 38 - 126 U/L   Total Bilirubin 1.2 0.3 - 1.2 mg/dL   GFR calc non Af Amer >60 >60 mL/min   GFR calc Af Amer >60 >60 mL/min    Comment: (NOTE) The eGFR has been calculated using the CKD EPI equation. This calculation has not been validated in all clinical situations. eGFR's persistently <60 mL/min signify possible Chronic Kidney Disease.    Anion gap 9 5 - 15  CBC     Status: None   Collection Time: 05/19/15 10:24 PM  Result Value Ref Range   WBC 7.8 4.0 - 10.5 K/uL   RBC 4.75 4.22 - 5.81 MIL/uL   Hemoglobin 15.5 13.0 - 17.0 g/dL   HCT 44.1 39.0 - 52.0 %   MCV 92.8 78.0 -  100.0 fL   MCH 32.6 26.0 - 34.0 pg   MCHC 35.1 30.0 - 36.0 g/dL   RDW 13.7 11.5 - 15.5 %   Platelets 232 150 - 400 K/uL  Ethanol (ETOH)     Status: None   Collection Time: 05/19/15 10:25 PM  Result Value Ref Range   Alcohol, Ethyl (B) <5 <5 mg/dL    Comment:        LOWEST DETECTABLE LIMIT FOR SERUM ALCOHOL IS 5 mg/dL FOR MEDICAL PURPOSES ONLY   Salicylate level     Status: None   Collection Time: 05/19/15 10:25 PM  Result Value Ref Range   Salicylate Lvl <1.6 2.8 - 30.0 mg/dL  Acetaminophen level     Status: Abnormal   Collection Time: 05/19/15 10:25 PM  Result Value Ref Range   Acetaminophen (Tylenol), Serum <10 (L) 10 - 30 ug/mL    Comment:        THERAPEUTIC CONCENTRATIONS VARY SIGNIFICANTLY. A RANGE OF 10-30 ug/mL MAY BE AN EFFECTIVE CONCENTRATION FOR MANY PATIENTS. HOWEVER, SOME ARE BEST TREATED AT CONCENTRATIONS OUTSIDE THIS RANGE. ACETAMINOPHEN CONCENTRATIONS >150 ug/mL AT 4 HOURS AFTER INGESTION AND >50 ug/mL AT 12 HOURS AFTER INGESTION ARE OFTEN ASSOCIATED WITH TOXIC REACTIONS.   Urine rapid drug screen (hosp performed) (Not at [facility])     Status: Abnormal   Collection Time: 05/20/15  3:46 AM  Result Value Ref Range   Opiates NONE DETECTED NONE DETECTED   Cocaine POSITIVE (A)  NONE DETECTED   Benzodiazepines NONE DETECTED NONE DETECTED   Amphetamines NONE DETECTED NONE DETECTED   Tetrahydrocannabinol NONE DETECTED NONE DETECTED   Barbiturates NONE DETECTED NONE DETECTED    Comment:        DRUG SCREEN FOR MEDICAL PURPOSES ONLY.  IF CONFIRMATION IS NEEDED FOR ANY PURPOSE, NOTIFY LAB WITHIN 5 DAYS.        LOWEST DETECTABLE LIMITS FOR URINE DRUG SCREEN Drug Class       Cutoff (ng/mL) Amphetamine      1000 Barbiturate      200 Benzodiazepine   109 Tricyclics       604 Opiates          300 Cocaine          300 THC              50     Observation Level/Precautions:  15 minute checks  Laboratory:  Per ED  Psychotherapy:  Group sessions  Medications:    Consultations: As needed   Discharge Concerns:  Safety, Mood stability  Estimated LOS: 3-5 days  Other:     Psychological Evaluations: Yes   Treatment Plan Summary: Daily contact with patient to assess and evaluate symptoms and progress in treatment and Medication management: 1. Admit for crisis management and stabilization, estimated length of stay 3-5 days.  2. Medication management to reduce current symptoms to base line and improve the patient's overall level of functioning  3. Treat health problems as indicated.  4. Develop treatment plan to decrease risk of relapse upon discharge and the need for readmission.  5. Psycho-social education regarding relapse prevention and self care.  6. Health care follow up as needed for medical problems.  7. Review, reconcile, and reinstate any pertinent home medications for other health issues where appropriate. 8. Call for consults with hospitalist for any additional specialty patient care services as needed.  Medical Decision Making:  New problem, with additional work up planned, Review of Psycho-Social Stressors (1), Review or order  clinical lab tests (1), Discuss test with performing physician (1), Review of Medication Regimen & Side Effects (2) and Review of  New Medication or Change in Dosage (2)  I certify that inpatient services furnished can reasonably be expected to improve the patient's condition.   Alvis Lemmings, FNP-BC 8/23/201612:02 PM I personally assessed the patient, reviewed the physical exam and labs and formulated the treatment plan Geralyn Flash A. Sabra Heck, M.D.

## 2015-05-20 NOTE — Tx Team (Signed)
  Interdisciplinary Treatment Plan Update (Adult)  Date:  05/20/2015  Time Reviewed:  8:56 AM   Progress in Treatment: Attending groups: Yes. Participating in groups:  Yes. Taking medication as prescribed:  Yes. Tolerating medication:  Yes. Family/Significant othe contact made:  SPE required for this pt.  Patient understands diagnosis:  Yes. and As evidenced by:  seeking treatment for SI, depression, med stabilization. Discussing patient identified problems/goals with staff:  Yes. Medical problems stabilized or resolved:  Yes. Denies suicidal/homicidal ideation: Yes. Issues/concerns per patient self-inventory:  Other:  Discharge Plan or Barriers: CSW assessing. Pt in bed this morning. PSA required. Pt has hx with [facility], possibly their ACT team.   Reason for Continuation of Hospitalization: Depression Medication stabilization Suicidal ideation  Comments:  [name] is an 39 y.o. male presenting to [facility] [place] reporting passive suicidal ideations. Pt stated "I don't want to be living". "I don't want to be here". "I am getting tired of coming to the hospital even though I haven't been admitted in 2 years". Pt reported that he gets biweekly injections and has missed his last injection. Pt reported that he is diagnosed with paranoid schizophrenia. Pt denies HI and AVH at this time. Pt denied any alcohol or illicit substance use but his UDS is positive for cocaine. Pt did not report any current mental health service and reported that his services ended 1 month ago "because they thought I was doing better"pt has dx of Schizoaffective Disorder.  Estimated length of stay:  3-5 days   New goal(s): to formulate effective aftercare plan.   Additional Comments:  Patient and CSW reviewed pt's identified goals and treatment plan. Patient verbalized understanding and agreed to treatment plan. CSW reviewed [facility] "Discharge Process and Patient Involvement" Form. Pt verbalized understanding  of information provided and signed form.    Review of initial/current patient goals per problem list:  1. Goal(s): Patient will participate in aftercare plan  Met: No.   Target date: at discharge  As evidenced by: Patient will participate within aftercare plan AEB aftercare provider and housing plan at discharge being identified.  8/23: PSA needed. CSW assessing for appropriate referrals.   2. Goal (s): Patient will exhibit decreased depressive symptoms and suicidal ideations.  Met: No.    Target date: at discharge  As evidenced by: Patient will utilize self rating of depression at 3 or below and demonstrate decreased signs of depression or be deemed stable for discharge by MD.  8/23: Pt reports high depression, no SI/HI/AVH reported today.    Attendees: Patient:   05/20/2015 8:56 AM   Family:   05/20/2015 8:56 AM   Physician:  Dr. Carlton Adam, MD 05/20/2015 8:56 AM   Nursing:   Vinnie Level RN; Chrys Racer RN 05/20/2015 8:56 AM   Clinical Social Worker: Maxie Better, [place]  05/20/2015 8:56 AM   Clinical Social Worker: Erasmo Downer Drinkard LCSWA; Peri Maris LCSWA 05/20/2015 8:56 AM   Other:  Gerline Legacy Nurse Case Manager 05/20/2015 8:56 AM   Other:  Lucinda Dell; [facility]  05/20/2015 8:56 AM   Other:   05/20/2015 8:56 AM   Other:  05/20/2015 8:56 AM   Other:  05/20/2015 8:56 AM   Other:  05/20/2015 8:56 AM    05/20/2015 8:56 AM    05/20/2015 8:56 AM    05/20/2015 8:56 AM    05/20/2015 8:56 AM    Scribe for Treatment Team:   Maxie Better, [place]  05/20/2015 8:56 AM

## 2015-05-20 NOTE — BHH Counselor (Signed)
Adult Comprehensive Assessment  Patient ID: [name], male   DOB: 1976/08/30, 39 y.o.   MRN: [id number]  Information Source: Information source: Patient  Current Stressors:  Educational / Learning stressors: N/A Employment / Job issues: Yes Occupational Family Relationships: N/A Surveyor, quantity / Lack of resources (include bankruptcy): Yes Fixed income Housing / Lack of housing: N/A Physical health (include injuries & life threatening diseases): N/A Social relationships: Yes Few positive Substance abuse: Yes Crack cocaine Bereavement / Loss: N/A  Living/Environment/Situation:  Living Arrangements: Alone Living conditions (as described by patient or guardian): Fair How long has patient lived in current situation?: Several months at [employer] estended stay hotel What is atmosphere in current home: Temporary;Chaotic  Family History:  Marital status: Single Does patient have children?: No  Childhood History:  By whom was/is the patient raised?: Both parents Description of patient's relationship with caregiver when they were a child: good Patient's description of current relationship with people who raised him/her: good Does patient have siblings?: Yes Number of Siblings: 3 Description of patient's current relationship with siblings: good with sister, none with brothers Did patient suffer any verbal/emotional/physical/sexual abuse as a child?: No Did patient suffer from severe childhood neglect?: No Has patient ever been sexually abused/assaulted/raped as an adolescent or adult?: No Was the patient ever a victim of a crime or a disaster?: No Witnessed domestic violence?: No Has patient been effected by domestic violence as an adult?: No  Education:   10th grade. Intellectually limited per pt.   Employment/Work Situation:  Employment situation: On disability Why is patient on disability: mental health How long has patient been on disability: "I don't  remember" Patient's job has been impacted by current illness: No What is the longest time patient has a held a job?: N/A Where was the patient employed at that time?: N/A Has patient ever been in the [employer]?: No Has patient ever served in combat?: No  Financial Resources:  Financial resources: Insurance claims handler Does patient have a Lawyer or guardian?: Yes Name of representative payee or guardian: [name]  Alcohol/Substance Abuse:  What has been your use of drugs/alcohol within the last 12 months?: none reported by pt.  Alcohol/Substance Abuse Treatment Hx: Past Tx, Outpatient If yes, describe treatment: [facility] Has alcohol/substance abuse ever caused legal problems?: No  Social Support System:  Patient's Community Support System: Good Describe Community Support System: girlfriend and formal supports Type of faith/religion: N/A How does patient's faith help to cope with current illness?: N/A  Leisure/Recreation:  Leisure and Hobbies: hang out  Strengths/Needs:  What things does the patient do well?: not sure In what areas does patient struggle / problems for patient: not sure  Discharge Plan:  Does patient have access to transportation?: Yes Carl patient be returning to same living situation after discharge?: [name] Dense a friend.  Currently receiving community mental health services: no-recently dropped from ACT team-unsure who this was. Pt would like to be set up with another ACT team.  Does patient have financial barriers related to discharge medications?: No  Summary/Recommendations:  [name] is a 39 YO Caucasian male here again for symptoms related to psychosis and substance abuse. He is paranoid, disorganized, laughs out loud and comes and goes abruptlly. Pt reoprts that his ACT team dropped him last month "because I was doing well" and pt reports missing his last injection.[name] is a 39 year old Caucasian Caucasian male. Admitted to [facility]  with complaints of anxiety attacks. He reports, "I was having anxiety  attacks. I don't know what the trigger is & don't know how long it has been going on. I'm also depressed, I just don't know how long. I don't know how much help you can give me here. I just need my Risperdal injection. I got it last about a month ago. My ACT Team gave it to me. I get the injection every 3 weeks. It is for Paranoid Schizophrenia. I did not get my last injection because I do not see my ACT Team no more. I feel tired & irritable, can you leave me alone. I need to rest. Hey, I also take Olanzapine. I have not taken it in 2 months" He currently denies SI/HI/AVH and is pleasant/cooperative with staff. Pt would like to get set up with another ACT team and Carl return home at d/c. Recommendations for pt include: crisis stabilization, medication managment, therapeutic milieu and development of comprehensive mental wellness plan   Smart, [name]. LCSWA 05/20/2015 8:53 AM

## 2015-05-21 MED ORDER — RISPERIDONE MICROSPHERES 37.5 MG IM SUSR: 37.5000 mg | Freq: Once | INTRAMUSCULAR | Status: DC

## 2015-05-21 MED ORDER — RISPERIDONE MICROSPHERES 37.5 MG IM SUSR
37.5000 mg | INTRAMUSCULAR | Status: DC
Start: 2015-05-21 — End: 2015-05-21

## 2015-05-21 MED ORDER — RISPERIDONE MICROSPHERES 37.5 MG IM SUSR
37.5000 mg | Freq: Once | INTRAMUSCULAR | Status: AC
  Administered 2015-05-22: 37.5 mg via INTRAMUSCULAR
  Filled 2015-05-21: qty 2

## 2015-05-21 NOTE — [facility] Group Notes (Signed)
[facility] LCSW Aftercare Discharge Planning Group Note   05/21/2015 11:08 AM  Participation Quality:  Invited-did not attend   Smart, Lebron Quam

## 2015-05-21 NOTE — Progress Notes (Signed)
[facility] MD Progress Note  05/21/2015 7:40 PM [name]  MRN:  [id number] Subjective:  Mazin states that he has really done well for the last several months. States he continues to be upset about the fact he has a payee as state they do not much and still keep a lot of his money. States he has done well and hopes to prove that he can handle his own money. He has been compliant with his medications having missed only one appointment for his shot. States he is trough using drugs and plans not to go around anyone using them. He admits that people try to take advantage of him Principal Problem: Schizoaffective disorder Diagnosis:   Patient Active Problem List   Diagnosis Date Noted  . Schizoaffective disorder [F25.9] 05/08/2013    Priority: High  . Cocaine abuse [F14.10] 11/11/2011    Priority: High  . Schizophrenia, undifferentiated [F20.3] 11/11/2011   Total Time spent with patient: 30 minutes   Past Medical History:  Past Medical History  Diagnosis Date  . Schizophrenic disorder   . Boxer's fracture   . Hypertension    History reviewed. No pertinent past surgical history. Family History: History reviewed. No pertinent family history. Social History:  History  Alcohol Use No    Comment: seldom     History  Drug Use No    Social History   Social History  . Marital Status: Single    Spouse Name: N/A  . Number of Children: N/A  . Years of Education: N/A   Social History Main Topics  . Smoking status: Current Every Day Smoker -- 1.00 packs/day for 15 years    Types: Cigarettes  . Smokeless tobacco: Never Used  . Alcohol Use: No     Comment: seldom  . Drug Use: No  . Sexual Activity: No   Other Topics Concern  . None   Social History Narrative   ** Merged History Encounter **       Additional History:    Sleep: Fair  Appetite:  Fair   Assessment:   Musculoskeletal: Strength & Muscle Tone: within normal limits Gait & Station: normal Patient leans:  normal   Psychiatric Specialty Exam: Physical Exam  Review of Systems  Constitutional: Negative.   HENT: Negative.   Eyes: Negative.   Respiratory: Negative.   Cardiovascular: Negative.   Gastrointestinal: Negative.   Genitourinary: Negative.   Musculoskeletal: Positive for back pain.  Skin: Negative.   Neurological: Negative.   Endo/Heme/Allergies: Negative.   Psychiatric/Behavioral: Positive for depression, hallucinations and substance abuse.    Height 6' (1.829 m), weight 109.317 kg (241 lb).Body mass index is 32.68 kg/(m^2).  General Appearance: Fairly Groomed  Engineer, water::  Fair  Speech:  Clear and Coherent  Volume:  Normal  Mood:  Anxious and worried  Affect:  Appropriate  Thought Process:  Coherent and Goal Directed  Orientation:  Full (Time, Place, and Person)  Thought Content:  symptoms envents worries concerns  Suicidal Thoughts:  No  Homicidal Thoughts:  No  Memory:  Immediate;   Fair Recent;   Fair Remote;   Fair  Judgement:  Fair  Insight:  Present  Psychomotor Activity:  Restlessness  Concentration:  Fair  Recall:  [employer] of Knowledge:Fair  Language: Fair  Akathisia:  No  Handed:  Right  AIMS (if indicated):     Assets:  Desire for Improvement  ADL's:  Intact  Cognition: WNL  Sleep:  Current Medications: Current Facility-Administered Medications  Medication Dose Route Frequency Provider Last Rate Last Dose  . acetaminophen (TYLENOL) tablet 650 mg  650 mg Oral Q6H PRN Evanna Burkett, NP      . alum & mag hydroxide-simeth (MAALOX/MYLANTA) 200-200-20 MG/5ML suspension 30 mL  30 mL Oral Q4H PRN Evanna Burkett, NP      . cyclobenzaprine (FLEXERIL) tablet 10 mg  10 mg Oral TID PRN Nicholaus Bloom, MD   10 mg at 05/20/15 2125  . gabapentin (NEURONTIN) capsule 300 mg  300 mg Oral TID Nicholaus Bloom, MD   300 mg at 05/21/15 1649  . ibuprofen (ADVIL,MOTRIN) tablet 800 mg  800 mg Oral TID Encarnacion Slates, NP   800 mg at 05/21/15 1649  . magnesium  hydroxide (MILK OF MAGNESIA) suspension 30 mL  30 mL Oral Daily PRN Evanna Burkett, NP      . OLANZapine zydis (ZYPREXA) disintegrating tablet 10 mg  10 mg Oral QHS Nicholaus Bloom, MD   10 mg at 05/20/15 2209  . [START ON 05/22/2015] risperiDONE microspheres (RISPERDAL CONSTA) injection 37.5 mg  37.5 mg Intramuscular Once Nicholaus Bloom, MD      . traZODone (DESYREL) tablet 50 mg  50 mg Oral QHS PRN,MR X 1 Evanna Burkett, NP   50 mg at 05/20/15 2125    Lab Results:  Results for orders placed or performed during the hospital encounter of 05/20/15 (from the past 48 hour(s))  Comprehensive metabolic panel     Status: Abnormal   Collection Time: 05/20/15  7:35 PM  Result Value Ref Range   Sodium 140 135 - 145 mmol/L   Potassium 4.0 3.5 - 5.1 mmol/L   Chloride 105 101 - 111 mmol/L   CO2 28 22 - 32 mmol/L   Glucose, Bld 80 65 - 99 mg/dL   BUN 15 6 - 20 mg/dL   Creatinine, Ser 1.10 0.61 - 1.24 mg/dL   Calcium 9.1 8.9 - 10.3 mg/dL   Total Protein 7.0 6.5 - 8.1 g/dL   Albumin 4.1 3.5 - 5.0 g/dL   AST 17 15 - 41 U/L   ALT 13 (L) 17 - 63 U/L   Alkaline Phosphatase 104 38 - 126 U/L   Total Bilirubin 0.8 0.3 - 1.2 mg/dL   GFR calc non Af Amer >60 >60 mL/min   GFR calc Af Amer >60 >60 mL/min    Comment: (NOTE) The eGFR has been calculated using the CKD EPI equation. This calculation has not been validated in all clinical situations. eGFR's persistently <60 mL/min signify possible Chronic Kidney Disease.    Anion gap 7 5 - 15    Comment: Performed at [facility]    Physical Findings: AIMS: Facial and Oral Movements Muscles of Facial Expression: None, normal Lips and Perioral Area: None, normal Jaw: None, normal Tongue: None, normal,Extremity Movements Upper (arms, wrists, hands, fingers): None, normal Lower (legs, knees, ankles, toes): None, normal, Trunk Movements Neck, shoulders, hips: None, normal, Overall Severity Severity of abnormal movements (highest score from  questions above): None, normal Incapacitation due to abnormal movements: None, normal Patient's awareness of abnormal movements (rate only patient's report): No Awareness, Dental Status Current problems with teeth and/or dentures?: No Does patient usually wear dentures?: No  CIWA:    COWS:     Treatment Plan Summary: Daily contact with patient to assess and evaluate symptoms and progress in treatment and Medication management Supportive approach/coping skills  Schizoaffective disorder; continue the Zyprexa  Zydis at 10 mg HS rather than 20. He would like to stay on the lower dose and it could be a good goal to completely come off after the Consta starts working  Will resume his Consta 37.5 mg Q 2 weeks, we communicated with [employer] and they confirmed that he last got the shot August the 5 and that he is scheduled every 2 weeks to get it.  Work on relapse Scientist, research (medical) Decision Making:  Review of Psycho-Social Stressors (1), Review or order clinical lab tests (1) and Review of Medication Regimen & Side Effects (2)     Azia Toutant A 05/21/2015, 7:40 PM

## 2015-05-21 NOTE — Clinical Social Work Note (Signed)
2nd attempt to complete PSA with pt. Pt irritable and in bed.   Trula Slade, LCSWA Clinical Social Worker 05/21/2015 11:08 AM

## 2015-05-21 NOTE — [facility] Group Notes (Signed)
[facility] LCSW Group Therapy  05/21/2015 1:18 PM  Type of Therapy:  Group Therapy  Participation Level:  Did Not Attend -pt chose to remain in bed.   Modes of Intervention:  Confrontation, Discussion, Education, Exploration, Problem-solving, Rapport Building, Socialization and Support  Summary of Progress/Problems: Today's Topic: Overcoming Obstacles. Patients identified one short term goal and potential obstacles in reaching this goal. Patients processed barriers involved in overcoming these obstacles. Patients identified steps necessary for overcoming these obstacles and explored motivation (internal and external) for facing these difficulties head on.   Smart, [name] LCSWA  05/21/2015, 1:18 PM

## 2015-05-21 NOTE — Progress Notes (Signed)
Patient is alert and pleasant. Got out of bed after several encouragement.  Denies auditory and visual hallucination.  Complaint of back pain. A: Medications given as ordered.  Maintained on routine safety checks per protocol.  Support and encouragement offered. R:  Patient contract for safety.  Stayed in his room most of the shift.

## 2015-05-21 NOTE — Progress Notes (Addendum)
Patient did not attend N/A group tonight.  

## 2015-05-22 NOTE — [facility] Group Notes (Addendum)
Adult Psychoeducational Group Note  Date:  05/22/2015 Time:  9:53 PM  Group Topic/Focus:  Wrap-Up Group:   The focus of this group is to help patients review their daily goal of treatment and discuss progress on daily workbooks.  Participation Level:  Active  Participation Quality:  Appropriate  Affect:  Appropriate  Cognitive:  Alert  Insight: Appropriate  Engagement in Group:  Engaged  Modes of Intervention:  Discussion  Additional Comments:  On a scale 1(worst) 10 (best), patient rated his day as a 7. Patient stated he is preparing for discharge tomorrow.   Ibtisam Benge L Fallou Hulbert 05/22/2015, 9:53 PM

## 2015-05-22 NOTE — Progress Notes (Signed)
Recreation Therapy Notes  Date: 08.24.2016 Time: 9:30am Location: [address] Group Room   Group Topic: Stress Management  Goal Area(s) Addresses:  Patient will actively participate in stress management techniques presented during session.   Behavioral Response: Engaged, Appropriate   Intervention: Stress management techniques  Activity :  Deep Breathing and Progressive Muscle Relaxation. LRT provided instruction and demonstration on practice of Progressive Muscle Relaxation. Technique was coupled with deep breathing.   Education:  Stress Management, Discharge Planning.   Education Outcome: Acknowledges edcuation  Clinical Observations/Feedback: Patient actively engaged in technique introduced during session, expressing no difficulty and demonstrating ability to practice independently post d/c.    Kasem Mozer L Alexarae Oliva, LRT/CTRS  Rhodie Cienfuegos L 05/22/2015 9:11 AM 

## 2015-05-22 NOTE — Progress Notes (Signed)
Patient ID: [name], male   DOB: July 05, 1976, 39 y.o.   MRN: [id number] D ---  Pt. Denies pain or dis-comfort at this time.Marland Kitchen  He states a HX of chronic back pain  ,but said he is having " a good day today"  In regards to his pain level.   He appears slow to process and is has a  Disheveled appearance.   He is pleasant and cooperative and takes all med asked asked.   He denied any issues with his currant medications.  Pt. Spends his free time in dayroom interacting with peers.  Pt. Agrees to contract for safety , has good eye contact and attends group.   --- A ---  Support, safety cks and meds as ordered. --- R --  Pt. Remain safe on unit

## 2015-05-22 NOTE — Progress Notes (Signed)
D: Pt was in the dayroom with his peers prior to the assessment. When asked if the writer could speak with him pt obliged. Pt entered the room and stated, "I just had chronic back pain for about 2 yrs, that's why I'm on the medicine I'm on. I had a nervous breakdown". Pt then stated, "I get tired of being treated like a kid". Pt states he wants to get his own money. States he has a payee, that gets $40.00 and "doesn't call to check on him".  Pt states he "mental health court" at 1330 on 05/22/15.  Pt stated, "I want people to know I'm in for my nerves and not anything else".  A:  Support and encouragement was offered. 15 min checks continued for safety.  R: Pt remains safe.

## 2015-05-22 NOTE — [facility] Group Notes (Signed)
[facility] LCSW Group Therapy  05/22/2015 1:23 PM  Type of Therapy:  Group Therapy  Participation Level:  Active  Participation Quality:  Attentive  Affect:  Appropriate  Cognitive:  Alert  Insight:  Improving  Engagement in Therapy:  Improving  Modes of Intervention:  Confrontation, Discussion, Education, Exploration, Problem-solving, Rapport Building, Socialization and Support  Summary of Progress/Problems: Emotion Regulation: This group focused on both positive and negative emotion identification and allowed group members to process ways to identify feelings, regulate negative emotions, and find healthy ways to manage internal/external emotions. Group members were asked to reflect on a time when their reaction to an emotion led to a negative outcome and explored how alternative responses using emotion regulation would have benefited them. Group members were also asked to discuss a time when emotion regulation was utilized when a negative emotion was experienced. [name] was attentive and engaged during today's processing group. He shared that the struggles with depression and mood disregulation when he does not take his injection. [name] stated that he wants to manage without an ACT team but is worried that he may forget his appts. [name] and CSW brainstormed ways to make sure he goes to appt and aftercare options. [name] had difficulty staying on topic but remained pleasant and calm throughout group.   Smart, [name] LCSWA  05/22/2015, 1:23 PM

## 2015-05-22 NOTE — Progress Notes (Signed)
[facility] MD Progress Note  05/22/2015 7:34 PM [name]  MRN:  [id number] Subjective:  [name] states he really wants to be given the chance to administer his own money. States this is a sort of on going stress for him, having to depend on others to give him his money and then keep part of it as payment for the service when they do not do anything for him. He states this is the best he has done since he got sick. He wants to be given the chance to prove it Principal Problem: Schizoaffective disorder Diagnosis:   Patient Active Problem List   Diagnosis Date Noted  . Schizoaffective disorder [F25.9] 05/08/2013    Priority: High  . Cocaine abuse [F14.10] 11/11/2011    Priority: High  . Schizophrenia, undifferentiated [F20.3] 11/11/2011   Total Time spent with patient: 30 minutes   Past Medical History:  Past Medical History  Diagnosis Date  . Schizophrenic disorder   . Boxer's fracture   . Hypertension    History reviewed. No pertinent past surgical history. Family History: History reviewed. No pertinent family history. Social History:  History  Alcohol Use No    Comment: seldom     History  Drug Use No    Social History   Social History  . Marital Status: Single    Spouse Name: N/A  . Number of Children: N/A  . Years of Education: N/A   Social History Main Topics  . Smoking status: Current Every Day Smoker -- 1.00 packs/day for 15 years    Types: Cigarettes  . Smokeless tobacco: Never Used  . Alcohol Use: No     Comment: seldom  . Drug Use: No  . Sexual Activity: No   Other Topics Concern  . None   Social History Narrative   ** Merged History Encounter **       Additional History:    Sleep: Fair  Appetite:  Fair   Assessment:   Musculoskeletal: Strength & Muscle Tone: within normal limits Gait & Station: normal Patient leans: normal   Psychiatric Specialty Exam: Physical Exam  Review of Systems  Constitutional: Negative.   HENT: Negative.   Eyes:  Negative.   Respiratory: Negative.   Cardiovascular: Negative.   Gastrointestinal: Negative.   Genitourinary: Negative.   Musculoskeletal: Negative.   Skin: Negative.   Neurological: Negative.   Endo/Heme/Allergies: Negative.   Psychiatric/Behavioral: Positive for substance abuse. The patient is nervous/anxious.     Blood pressure 106/59, pulse 56, temperature 98 F (36.7 C), temperature source Oral, height 6' (1.829 m), weight 109.317 kg (241 lb).Body mass index is 32.68 kg/(m^2).  General Appearance: Fairly Groomed  Engineer, water::  Fair  Speech:  Clear and Coherent  Volume:  Normal  Mood:  Anxious  Affect:  Appropriate  Thought Process:  Coherent and Goal Directed  Orientation:  Full (Time, Place, and Person)  Thought Content:  events worries concerns  Suicidal Thoughts:  No  Homicidal Thoughts:  No  Memory:  Immediate;   Fair Recent;   Fair Remote;   Fair  Judgement:  Fair  Insight:  Present  Psychomotor Activity:  Normal  Concentration:  Fair  Recall:  [employer] of Knowledge:Fair  Language: Fair  Akathisia:  No  Handed:  Right  AIMS (if indicated):     Assets:  Desire for Improvement  ADL's:  Intact  Cognition: WNL  Sleep:        Current Medications: Current Facility-Administered Medications  Medication Dose  Route Frequency Provider Last Rate Last Dose  . acetaminophen (TYLENOL) tablet 650 mg  650 mg Oral Q6H PRN Evanna Burkett, NP      . alum & mag hydroxide-simeth (MAALOX/MYLANTA) 200-200-20 MG/5ML suspension 30 mL  30 mL Oral Q4H PRN Evanna Burkett, NP      . cyclobenzaprine (FLEXERIL) tablet 10 mg  10 mg Oral TID PRN Nicholaus Bloom, MD   10 mg at 05/22/15 1449  . gabapentin (NEURONTIN) capsule 300 mg  300 mg Oral TID Nicholaus Bloom, MD   300 mg at 05/22/15 1635  . ibuprofen (ADVIL,MOTRIN) tablet 800 mg  800 mg Oral TID Encarnacion Slates, NP   800 mg at 05/22/15 1635  . magnesium hydroxide (MILK OF MAGNESIA) suspension 30 mL  30 mL Oral Daily PRN Evanna Burkett,  NP      . OLANZapine zydis (ZYPREXA) disintegrating tablet 10 mg  10 mg Oral QHS Nicholaus Bloom, MD   10 mg at 05/21/15 2141  . traZODone (DESYREL) tablet 50 mg  50 mg Oral QHS PRN,MR X 1 Evanna Burkett, NP   50 mg at 05/20/15 2125    Lab Results:  Results for orders placed or performed during the hospital encounter of 05/20/15 (from the past 48 hour(s))  Comprehensive metabolic panel     Status: Abnormal   Collection Time: 05/20/15  7:35 PM  Result Value Ref Range   Sodium 140 135 - 145 mmol/L   Potassium 4.0 3.5 - 5.1 mmol/L   Chloride 105 101 - 111 mmol/L   CO2 28 22 - 32 mmol/L   Glucose, Bld 80 65 - 99 mg/dL   BUN 15 6 - 20 mg/dL   Creatinine, Ser 1.10 0.61 - 1.24 mg/dL   Calcium 9.1 8.9 - 10.3 mg/dL   Total Protein 7.0 6.5 - 8.1 g/dL   Albumin 4.1 3.5 - 5.0 g/dL   AST 17 15 - 41 U/L   ALT 13 (L) 17 - 63 U/L   Alkaline Phosphatase 104 38 - 126 U/L   Total Bilirubin 0.8 0.3 - 1.2 mg/dL   GFR calc non Af Amer >60 >60 mL/min   GFR calc Af Amer >60 >60 mL/min    Comment: (NOTE) The eGFR has been calculated using the CKD EPI equation. This calculation has not been validated in all clinical situations. eGFR's persistently <60 mL/min signify possible Chronic Kidney Disease.    Anion gap 7 5 - 15    Comment: Performed at [facility]    Physical Findings: AIMS: Facial and Oral Movements Muscles of Facial Expression: None, normal Lips and Perioral Area: None, normal Jaw: None, normal Tongue: None, normal,Extremity Movements Upper (arms, wrists, hands, fingers): None, normal Lower (legs, knees, ankles, toes): None, normal, Trunk Movements Neck, shoulders, hips: None, normal, Overall Severity Severity of abnormal movements (highest score from questions above): None, normal Incapacitation due to abnormal movements: None, normal Patient's awareness of abnormal movements (rate only patient's report): No Awareness, Dental Status Current problems with teeth  and/or dentures?: No Does patient usually wear dentures?: No  CIWA:    COWS:     Treatment Plan Summary: Daily contact with patient to assess and evaluate symptoms and progress in treatment and Medication management Supportive approach/coping skills Cocaine abuse; continue to work a relapse prevention plan Schizoaffective disorder: Consta 37.5 mg IM today and q 2 weeks Work with CBT/mindfulness/lbehavioral activation  Medical Decision Making:  Review of Psycho-Social Stressors (1) and Review of Medication Regimen &  Side Effects (2)     Tykwon Fera A 05/22/2015, 7:34 PM

## 2015-05-23 DIAGNOSIS — F259 Schizoaffective disorder, unspecified: Secondary | ICD-10-CM

## 2015-05-23 DIAGNOSIS — F251 Schizoaffective disorder, depressive type: Secondary | ICD-10-CM | POA: Insufficient documentation

## 2015-05-23 MED ORDER — OLANZAPINE 10 MG PO TBDP: 10.0000 mg | ORAL_TABLET | Freq: Every day | ORAL | Status: DC

## 2015-05-23 MED ORDER — GABAPENTIN 300 MG PO CAPS: 300.0000 mg | ORAL_CAPSULE | Freq: Three times a day (TID) | ORAL | Status: DC

## 2015-05-23 MED ORDER — IBUPROFEN 800 MG PO TABS: 800.0000 mg | ORAL_TABLET | Freq: Three times a day (TID) | ORAL | Status: DC

## 2015-05-23 MED ORDER — TRAZODONE HCL 50 MG PO TABS: 50.0000 mg | ORAL_TABLET | Freq: Every evening | ORAL | Status: DC | PRN

## 2015-05-23 NOTE — [facility] Suicide Risk Assessment (Signed)
[facility] INPATIENT:  Family/Significant Other Suicide Prevention Education  Suicide Prevention Education:  Contact Attempts: [name] (pt's father) [phone] has been identified by the patient as the family member/significant other with whom the patient will be residing, and identified as the person(s) who will aid the patient in the event of a mental health crisis.  With written consent from the patient, two attempts were made to provide suicide prevention education, prior to and/or following the patient's discharge.  We were unsuccessful in providing suicide prevention education.  A suicide education pamphlet was given to the patient to share with family/significant other.  Date and time of first attempt: 05/22/15 at 4:05PM (generic voicemail-no message left)  Date and time of second attempt: 05/23/15 at 11:00AM (generic voicemail-no message left)   Smart, Margrit Minner LCSWA  05/23/2015, 11:05 AM

## 2015-05-23 NOTE — Discharge Summary (Signed)
Physician Discharge Summary Note  Patient:  [name] is an 39 y.o., male MRN:  [id number] DOB:  1976-05-18 Patient phone:  [phone] (home)  Patient address:    [place] [address],  Total Time spent with patient: Greater than 30 minutes  Date of Admission:  05/20/2015  Date of Discharge: 05-23-15  Reason for Admission: Worsening symptoms of Schizoaffective disorder & drug abuse  Principal Problem: Schizoaffective disorder Discharge Diagnoses: Patient Active Problem List   Diagnosis Date Noted  . Schizoaffective disorder [F25.9] 05/08/2013  . Schizophrenia, undifferentiated [F20.3] 11/11/2011  . Cocaine abuse [F14.10] 11/11/2011   Musculoskeletal: Strength & Muscle Tone: within normal limits Gait & Station: normal Patient leans: N/A  Psychiatric Specialty Exam: Physical Exam  Psychiatric: His behavior is normal. Judgment and thought content normal. His mood appears not anxious. His affect is not angry, not blunt and not labile. Cognition and memory are normal. He does not exhibit a depressed mood.    Review of Systems  Constitutional: Negative.   HENT: Negative.   Eyes: Negative.   Respiratory: Negative.   Cardiovascular: Negative.   Gastrointestinal: Negative.   Genitourinary: Negative.   Musculoskeletal: Negative.   Skin: Negative.   Neurological: Negative.   Endo/Heme/Allergies: Negative.   Psychiatric/Behavioral: Positive for depression (Stable). Negative for hallucinations and memory loss. Substance abuse: Cocaine dependence. The patient has insomnia (Stable). The patient is not nervous/anxious.     Blood pressure 105/66, pulse 90, temperature 97.7 F (36.5 C), temperature source Oral, resp. rate 20, height 6' (1.829 m), weight 109.317 kg (241 lb).Body mass index is 32.68 kg/(m^2).  See Md's SRA   Have you used any form of tobacco in the last 30 days? (Cigarettes, Smokeless Tobacco, Cigars, and/or Pipes): Yes  Has this patient used any  form of tobacco in the last 30 days? (Cigarettes, Smokeless Tobacco, Cigars, and/or Pipes): Yes, Prescription not provided because: nicotine patches provided   Past Medical History:  Past Medical History  Diagnosis Date  . Schizophrenic disorder   . Boxer's fracture   . Hypertension    History reviewed. No pertinent past surgical history. Family History: History reviewed. No pertinent family history. Social History:  History  Alcohol Use No    Comment: seldom     History  Drug Use No    Social History   Social History  . Marital Status: Single    Spouse Name: N/A  . Number of Children: N/A  . Years of Education: N/A   Social History Main Topics  . Smoking status: Current Every Day Smoker -- 1.00 packs/day for 15 years    Types: Cigarettes  . Smokeless tobacco: Never Used  . Alcohol Use: No     Comment: seldom  . Drug Use: No  . Sexual Activity: No   Other Topics Concern  . None   Social History Narrative   ** Merged History Encounter **       Risk to Self: Is patient at risk for suicide?: Yes Risk to Others: No Prior Inpatient Therapy: Yes Prior Outpatient Therapy: Yes  Level of Care:  OP  Hospital Course:  Laydon is a 39 year old Caucasian Caucasian male. Admitted to [facility] with complaints of anxiety attacks. He reports, "I was having anxiety attacks. I don't know what the trigger is & don't know how long it has been going on. I'm also depressed, I just don't know how long. I don't know how much help you can give me here. I just  need my Risperdal injection. I got it last about a month ago. My ACT Team gave it to me. I get the injection every 3 weeks. It is for Paranoid Schizophrenia. I did not get my last injection because I do not see my ACT Team no more. I feel tired & irritable, can you leave me alone. I need to rest. Hey, I also take Olanzapine. I have not taken it in 2 months"  Taiwo was admitted to the hospital with his UDS test reports showing positive Cocaine.  However, his reason for admission was worsening symptoms of Schizophrenia requiring mood stabilization treatment. After evaluation of his symptoms, Mohamedamin was started on medication regimen for his presenting symptoms. His medication regimen included; Olanzapine10 mg daily for mood control, Gabapentin 300 mg for agitation &Trazodone 50 mg daily for insomnia. He was also enrolled & participated in the group counseling sessions being offered and held on this unit, he learned coping skills that should help him cope better and maintain mood stability after discharge. He presented no other significant pre-existing health issues that required treatment and or monitoring other than minor aches & pains. He tolerated his treatment regimen without any significant adverse effects and or reactions.  Brads's symptoms were evaluated on daily basis by a clinical provider to ascertain his symptoms are responding to his treatment regimen. And they were.This is evidenced by his reports of decreasing symptoms, improved mood, sleep, appetite and presentation of good affect. He is currently being discharged to continue psychiatric treatment and medication management with the [facility] in [place], [place]. He is provided with all the pertinent information required to make this appointment without problems.   On this day of hospital discharge, Quadre is in much improved condition than upon admission. Patient contracted for his safety and felt more in control of his mood. His symptoms were reported as significantly decreased or resolved completely. He is instructed & motivated to continue taking medications with a goal of continued improvement in mental health. The patient was provided with some sample and prescriptions for his [facility] discharged medications. He is in full contact with reality. There are no active SI plans or intent. Ranvir states he is really ready to get his life back in order. He rightly so reminds [place] of how much  better he is right now as compared to when he used to come here last time being 2 years ago. He has been building a case for him being in charge of his own money not needing a payee. States that his experiences with payees before has not been positive and a source of stress for him. He feels he can be trusted with his own money. He recognizes he has been taken advantage of before and knows who to avoid and who he can count on. Transportation per city bus. [facility] assisted with Bus pass.  Consults:  psychiatry  Significant Diagnostic Studies:  labs: CBC wqith diff, CMP, UDS, toxicology tests, U/A, results reviewed, stable  Discharge Vitals:   Blood pressure 105/66, pulse 90, temperature 97.7 F (36.5 C), temperature source Oral, resp. rate 20, height 6' (1.829 m), weight 109.317 kg (241 lb). Body mass index is 32.68 kg/(m^2). Lab Results:   Results for orders placed or performed during the hospital encounter of 05/20/15 (from the past 72 hour(s))  Comprehensive metabolic panel     Status: Abnormal   Collection Time: 05/20/15  7:35 PM  Result Value Ref Range   Sodium 140 135 - 145 mmol/L  Potassium 4.0 3.5 - 5.1 mmol/L   Chloride 105 101 - 111 mmol/L   CO2 28 22 - 32 mmol/L   Glucose, Bld 80 65 - 99 mg/dL   BUN 15 6 - 20 mg/dL   Creatinine, Ser 1.10 0.61 - 1.24 mg/dL   Calcium 9.1 8.9 - 10.3 mg/dL   Total Protein 7.0 6.5 - 8.1 g/dL   Albumin 4.1 3.5 - 5.0 g/dL   AST 17 15 - 41 U/L   ALT 13 (L) 17 - 63 U/L   Alkaline Phosphatase 104 38 - 126 U/L   Total Bilirubin 0.8 0.3 - 1.2 mg/dL   GFR calc non Af Amer >60 >60 mL/min   GFR calc Af Amer >60 >60 mL/min    Comment: (NOTE) The eGFR has been calculated using the CKD EPI equation. This calculation has not been validated in all clinical situations. eGFR's persistently <60 mL/min signify possible Chronic Kidney Disease.    Anion gap 7 5 - 15    Comment: Performed at [facility]   Physical Findings: AIMS: Facial and  Oral Movements Muscles of Facial Expression: None, normal Lips and Perioral Area: None, normal Jaw: None, normal Tongue: None, normal,Extremity Movements Upper (arms, wrists, hands, fingers): None, normal Lower (legs, knees, ankles, toes): None, normal, Trunk Movements Neck, shoulders, hips: None, normal, Overall Severity Severity of abnormal movements (highest score from questions above): None, normal Incapacitation due to abnormal movements: None, normal Patient's awareness of abnormal movements (rate only patient's report): No Awareness, Dental Status Current problems with teeth and/or dentures?: No Does patient usually wear dentures?: No  CIWA:    COWS:     See Psychiatric Specialty Exam and Suicide Risk Assessment completed by Attending Physician prior to discharge.  Discharge destination:  Home  Is patient on multiple antipsychotic therapies at discharge:  No   Has Patient had three or more failed trials of antipsychotic monotherapy by history:  No  Recommended Plan for Multiple Antipsychotic Therapies: NA    Medication List    STOP taking these medications        cyclobenzaprine 5 MG tablet  Commonly known as:  FLEXERIL     risperiDONE microspheres 25 MG injection  Commonly known as:  RISPERDAL CONSTA      TAKE these medications      Indication   gabapentin 300 MG capsule  Commonly known as:  NEURONTIN  Take 1 capsule (300 mg total) by mouth 3 (three) times daily. For agitation   Indication:  Agitation     ibuprofen 800 MG tablet  Commonly known as:  ADVIL,MOTRIN  Take 1 tablet (800 mg total) by mouth 3 (three) times daily. Pain management   Indication:  Pain managment     OLANZapine zydis 10 MG disintegrating tablet  Commonly known as:  ZYPREXA  Take 1 tablet (10 mg total) by mouth at bedtime. For mood control   Indication:  Mood control     traZODone 50 MG tablet  Commonly known as:  DESYREL  Take 1 tablet (50 mg total) by mouth at bedtime as needed  and may repeat dose one time if needed for sleep.   Indication:  Trouble Sleeping       Follow-up Information    [facility].   Why:  Appt on this date at 10:20AM for medication management/therapy. (Injection).    Contact information:   [address] [place], [place] [address] Phone: [phone] Fax: [phone]  Follow-up recommendations:  Activity:  As tolerated Diet: As recommended by your primary care doctor. Keep all scheduled follow-up appointments as recommended.  Comments: Take all your medications as prescribed by your mental healthcare provider. Report any adverse effects and or reactions from your medicines to your outpatient provider promptly. Patient is instructed and cautioned to not engage in alcohol and or illegal drug use while on prescription medicines. In the event of worsening symptoms, patient is instructed to call the crisis hotline, 911 and or go to the nearest ED for appropriate evaluation and treatment of symptoms. Follow-up with your primary care provider for your other medical issues, concerns and or health care needs.   Total Discharge Time: Greater than 30 minuts  Signed: Encarnacion Slates, PMHNP, FNP_BC 05/23/2015, 10:44 AM  I personally assessed the patient and formulated the plan Geralyn Flash A. Sabra Heck, M.D.

## 2015-05-23 NOTE — Progress Notes (Signed)
  D: Pt informed the writer that he plans to be discharged tomorrow. Stated, "he going to follow a new program". Pt plans to return to home where he was living prior to adm at [facility].  A:  Support and encouragement was offered. 15 min checks continued for safety.  R: Pt remains safe.

## 2015-05-23 NOTE — BHH Suicide Risk Assessment (Signed)
[facility] Discharge Suicide Risk Assessment   Demographic Factors:  Male and Caucasian  Total Time spent with patient: 30 minutes  Musculoskeletal: Strength & Muscle Tone: within normal limits Gait & Station: normal Patient leans: normal  Psychiatric Specialty Exam: Physical Exam  ROS  Blood pressure 105/66, pulse 90, temperature 97.7 F (36.5 C), temperature source Oral, resp. rate 20, height 6' (1.829 m), weight 109.317 kg (241 lb).Body mass index is 32.68 kg/(m^2).  General Appearance: Fairly Groomed  Patent attorney::  Fair  Speech:  Clear and Coherent409  Volume:  Normal  Mood:  Euthymic  Affect:  Appropriate  Thought Process:  Coherent and Goal Directed  Orientation:  Full (Time, Place, and Person)  Thought Content:  plans as he moves on, relapse prevention plan  Suicidal Thoughts:  No  Homicidal Thoughts:  No  Memory:  Immediate;   Fair Recent;   Fair Remote;   Fair  Judgement:  Fair  Insight:  Present  Psychomotor Activity:  Normal  Concentration:  Fair  Recall:  [employer] of Knowledge:Fair  Language: Fair  Akathisia:  No  Handed:  Right  AIMS (if indicated):     Assets:  Desire for Improvement  Sleep:     Cognition: WNL  ADL's:  Intact   Have you used any form of tobacco in the last 30 days? (Cigarettes, Smokeless Tobacco, Cigars, and/or Pipes): Yes  Has this patient used any form of tobacco in the last 30 days? (Cigarettes, Smokeless Tobacco, Cigars, and/or Pipes) Yes, Prescription not provided because: nicotine patches provided  Mental Status Per Nursing Assessment::   On Admission:  NA  Current Mental Status by Physician: In full contact with reality. There are no active SI plans or intent. [name] states he is really ready to get his life back in order. He rightly so reminds [place] of how much better he is right now as compared to when he used to come here last time being 2 years ago. He has been building a case for him being in charge of his own money not needing a  payee. States that his experiences with payees before has not been positive and a source of stress for him. He feels he can be trusted with his own money. He recognizes he has been taken advantage of before and knows who to avoid and who he can count on   Loss Factors: NA  Historical Factors: NA  Risk Reduction Factors:   Living with another person, especially a relative  Continued Clinical Symptoms:  Alcohol/Substance Abuse/Dependencies Schizophrenia:   Less than 7 years old  Cognitive Features That Contribute To Risk:  Closed-mindedness, Polarized thinking and Thought constriction (tunnel vision)    Suicide Risk:  Minimal: No identifiable suicidal ideation.  Patients presenting with no risk factors but with morbid ruminations; may be classified as minimal risk based on the severity of the depressive symptoms  Principal Problem: Schizoaffective disorder Discharge Diagnoses:  Patient Active Problem List   Diagnosis Date Noted  . Schizoaffective disorder [F25.9] 05/08/2013    Priority: High  . Cocaine abuse [F14.10] 11/11/2011    Priority: High  . Schizophrenia, undifferentiated [F20.3] 11/11/2011    Follow-up Information    Follow up with [facility] On 06/10/2015.   Why:  Appt on this date at 10:20AM for medication management/therapy. (Injection).    Contact information:   [address] [employer] [address]. [place], [place] [address] Phone: [phone] Fax: [phone]      Plan Of Care/Follow-up recommendations:  Activity:  as tolerated Diet:  regular Follow up as above Is patient on multiple antipsychotic therapies at discharge:  Yes Has Patient had three or more failed trials of antipsychotic monotherapy by history:  Yes,   Antipsychotic medications that previously failed include:   1.  haldol., 2.  geodon. and 3.  zyprexa by itself.  Recommended Plan for Multiple Antipsychotic Therapies: Additional reason(s) for multiple antispychotic treatment:  teh combination has  kept him out of  the hospital for 2 years    [name] A 05/23/2015, 11:21 AM

## 2015-05-23 NOTE — Progress Notes (Signed)
  [facility] Adult Case Management Discharge Plan :  Will you be returning to the same living situation after discharge:  Yes,  home At discharge, do you have transportation home?: Yes,  bus pass in chart Do you have the ability to pay for your medications: Yes,  Medicare  Release of information consent forms completed and submitted to Medical Records by CSW.   Patient to Follow up at: Follow-up Information    Follow up with [facility] On 06/10/2015.   Why:  Appt on this date at 10:20AM for medication management/therapy. (Injection).    Contact information:   [address] [employer]. [place], [place] [address] Phone: [phone] Fax: [phone]      Patient denies SI/HI: Yes,  during group/self report.     Safety Planning and Suicide Prevention discussed: Yes,  SPE completed with pt. Contact attempts made with pt's father.   Have you used any form of tobacco in the last 30 days? (Cigarettes, Smokeless Tobacco, Cigars, and/or Pipes): Yes  Has patient been referred to the Quitline?: Patient refused referral  Smart, Lebron Quam  05/23/2015, 11:06 AM

## 2015-05-23 NOTE — Clinical Social Work Note (Signed)
Per pt request, fax sent to both [facility] clerk of court and [place] [employer] attorney's office stating that pt is currently hospitalized and asking to excuse him from court on 8/25. Letters faxed yesterday afternoon and confirmations provided to pt for his records.  Trula Slade, LCSWA Clinical Social Worker 05/23/2015 8:42 AM

## 2015-05-23 NOTE — Tx Team (Signed)
Interdisciplinary Treatment Plan Update (Adult)  Date:  05/23/2015  Time Reviewed:  11:07 AM   Progress in Treatment: Attending groups: Yes. Participating in groups:  Yes. Taking medication as prescribed:  Yes. Tolerating medication:  Yes. Family/Significant othe contact made:  Contact attempts made with pt's father. SPE completed with pt.  Patient understands diagnosis:  Yes. and As evidenced by:  seeking treatment for SI, depression, med stabilization. Discussing patient identified problems/goals with staff:  Yes. Medical problems stabilized or resolved:  Yes. Denies suicidal/homicidal ideation: Yes. Issues/concerns per patient self-inventory:  Other:  Discharge Plan or Barriers: Pt has follow-up set up with [facility]. Mental health association information also provided per pt request.   Reason for Continuation of Hospitalization: None  Comments:  [name] is an 39 y.o. male presenting to [facility] reporting passive suicidal ideations. Pt stated "I don't want to be living". "I don't want to be here". "I am getting tired of coming to the hospital even though I haven't been admitted in 2 years". Pt reported that he gets biweekly injections and has missed his last injection. Pt reported that he is diagnosed with paranoid schizophrenia. Pt denies HI and AVH at this time. Pt denied any alcohol or illicit substance use but his UDS is positive for cocaine. Pt did not report any current mental health service and reported that his services ended 1 month ago "because they thought I was doing better"pt has dx of Schizoaffective Disorder.  Estimated length of stay:  D/c today at 11:00AM  Additional Comments:  Patient and CSW reviewed pt's identified goals and treatment plan. Patient verbalized understanding and agreed to treatment plan. CSW reviewed [facility] "Discharge Process and Patient Involvement" Form. Pt verbalized understanding of information provided and signed form.    Review of  initial/current patient goals per problem list:  1. Goal(s): Patient will participate in aftercare plan  Met: Yes   Target date: at discharge  As evidenced by: Patient will participate within aftercare plan AEB aftercare provider and housing plan at discharge being identified.  8/23: PSA needed. CSW assessing for appropriate referrals.   8/26: Pt to return home (bus passes provided) and has follow-up appt scheduled with [facility] for mental health services.   2. Goal (s): Patient will exhibit decreased depressive symptoms and suicidal ideations.  Met: Yes    Target date: at discharge  As evidenced by: Patient will utilize self rating of depression at 3 or below and demonstrate decreased signs of depression or be deemed stable for discharge by MD.  8/23: Pt reports high depression, no SI/HI/AVH reported today.   8/26: Pt rates depression as 0/10 and presents with pleasant mood and calm affect.    Attendees: Patient:   05/23/2015 11:07 AM   Family:   05/23/2015 11:07 AM   Physician:  Dr. Carlton Adam, MD 05/23/2015 11:07 AM   Nursing:   Vallery Ridge; Jake Church RN 05/23/2015 11:07 AM   Clinical Social Worker: Maxie Better, [place]  05/23/2015 11:07 AM   Clinical Social Worker: Erasmo Downer Drinkard LCSWA; Peri Maris LCSWA 05/23/2015 11:07 AM   Other:  Gerline Legacy Nurse Case Manager 05/23/2015 11:07 AM   Other:  Lucinda Dell; [facility]  05/23/2015 11:07 AM   Other:   05/23/2015 11:07 AM   Other:  05/23/2015 11:07 AM   Other:  05/23/2015 11:07 AM   Other:  05/23/2015 11:07 AM    05/23/2015 11:07 AM    05/23/2015 11:07 AM    05/23/2015  11:07 AM    05/23/2015 11:07 AM    Scribe for Treatment Team:   Maxie Better, [place]  05/23/2015 11:07 AM

## 2015-06-13 DIAGNOSIS — F250 Schizoaffective disorder, bipolar type: Secondary | ICD-10-CM | POA: Diagnosis not present

## 2015-06-17 DIAGNOSIS — Z5181 Encounter for therapeutic drug level monitoring: Secondary | ICD-10-CM | POA: Diagnosis not present

## 2015-06-20 DIAGNOSIS — F250 Schizoaffective disorder, bipolar type: Secondary | ICD-10-CM | POA: Diagnosis not present

## 2015-08-02 ENCOUNTER — Emergency Department (HOSPITAL_COMMUNITY)
Admission: EM | Admit: 2015-08-02 | Discharge: 2015-08-03 | Disposition: A | Discharge status: Other Acute Inpatient Hospital | Payer: Medicare Other | Attending: Emergency Medicine | Admitting: Emergency Medicine

## 2015-08-02 ENCOUNTER — Encounter (HOSPITAL_COMMUNITY): Payer: Self-pay | Admitting: *Deleted

## 2015-08-02 DIAGNOSIS — F17210 Nicotine dependence, cigarettes, uncomplicated: Secondary | ICD-10-CM | POA: Diagnosis not present

## 2015-08-02 DIAGNOSIS — R109 Unspecified abdominal pain: Secondary | ICD-10-CM | POA: Diagnosis present

## 2015-08-02 DIAGNOSIS — R440 Auditory hallucinations: Secondary | ICD-10-CM

## 2015-08-02 DIAGNOSIS — F251 Schizoaffective disorder, depressive type: Secondary | ICD-10-CM | POA: Diagnosis present

## 2015-08-02 DIAGNOSIS — F1410 Cocaine abuse, uncomplicated: Secondary | ICD-10-CM

## 2015-08-02 DIAGNOSIS — F23 Brief psychotic disorder: Secondary | ICD-10-CM

## 2015-08-02 DIAGNOSIS — F259 Schizoaffective disorder, unspecified: Secondary | ICD-10-CM

## 2015-08-02 LAB — CBG MONITORING, ED: Glucose-Capillary: 85 mg/dL (ref 65–99)

## 2015-08-02 LAB — COMPREHENSIVE METABOLIC PANEL
ALK PHOS: 113 U/L (ref 38–126)
ALT: 15 U/L — AB (ref 17–63)
AST: 21 U/L (ref 15–41)
Albumin: 3.8 g/dL (ref 3.5–5.0)
Anion gap: 5 (ref 5–15)
CALCIUM: 9 mg/dL (ref 8.9–10.3)
CO2: 30 mmol/L (ref 22–32)
CREATININE: 1.16 mg/dL (ref 0.61–1.24)
Chloride: 103 mmol/L (ref 101–111)
GFR calc non Af Amer: >60 mL/min (ref >60)
GLUCOSE: 84 mg/dL (ref 65–99)
Potassium: 3.4 mmol/L — ABNORMAL LOW (ref 3.5–5.1)
SODIUM: 138 mmol/L (ref 135–145)
Total Bilirubin: 1.1 mg/dL (ref 0.3–1.2)
Total Protein: 6.4 g/dL — ABNORMAL LOW (ref 6.5–8.1)

## 2015-08-02 LAB — URINALYSIS, ROUTINE W REFLEX MICROSCOPIC
BILIRUBIN URINE: NEGATIVE
GLUCOSE, UA: NEGATIVE mg/dL
HGB URINE DIPSTICK: NEGATIVE
KETONES UR: NEGATIVE mg/dL
Leukocytes, UA: NEGATIVE
Nitrite: NEGATIVE
PH: 6 (ref 5.0–8.0)
PROTEIN: NEGATIVE mg/dL
Specific Gravity, Urine: 1.01 (ref 1.005–1.030)
Urobilinogen, UA: 1 mg/dL (ref 0.0–1.0)

## 2015-08-02 LAB — RAPID URINE DRUG SCREEN, HOSP PERFORMED
AMPHETAMINES: NOT DETECTED
BARBITURATES: NOT DETECTED
Benzodiazepines: NOT DETECTED
Cocaine: POSITIVE — AB
Opiates: NOT DETECTED
TETRAHYDROCANNABINOL: NOT DETECTED

## 2015-08-02 LAB — LIPASE, BLOOD: Lipase: 20 U/L (ref 11–51)

## 2015-08-02 LAB — CBC
HCT: 44 % (ref 39.0–52.0)
Hemoglobin: 15.3 g/dL (ref 13.0–17.0)
MCH: 31.8 pg (ref 26.0–34.0)
MCHC: 34.8 g/dL (ref 30.0–36.0)
MCV: 91.5 fL (ref 78.0–100.0)
PLATELETS: 214 10*3/uL (ref 150–400)
RBC: 4.81 MIL/uL (ref 4.22–5.81)
RDW: 12.6 % (ref 11.5–15.5)
WBC: 8.2 10*3/uL (ref 4.0–10.5)

## 2015-08-02 LAB — SALICYLATE LEVEL: Salicylate Lvl: <4 mg/dL (ref 2.8–30.0)

## 2015-08-02 LAB — ETHANOL: Alcohol, Ethyl (B): <5 mg/dL (ref <5)

## 2015-08-02 LAB — ACETAMINOPHEN LEVEL

## 2015-08-02 MED ORDER — OLANZAPINE 10 MG PO TBDP
10.0000 mg | ORAL_TABLET | Freq: Every day | ORAL | Status: DC
  Administered 2015-08-02: 10 mg via ORAL
  Filled 2015-08-02 (×2): qty 1
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
## 2015-08-02 NOTE — ED Notes (Signed)
Pt reports abd pain and diarrhea. Hx of schizophrenia but has been off his meds. Having auditory hallucinations but denies SI or HI.

## 2015-08-02 NOTE — ED Notes (Signed)
[facility], to room 34 . [facility]. Pt. Is alert and oriented, warm and dry in no distress. Pt. Denies SI, HI, and VH. Pt. States that he hears voices telling him to do unspecified things. Pt. Calm and cooperative. Pt. Made aware of security cameras and Q15 minute rounds. Pt. Encouraged to let Nursing staff know of any concerns or needs.

## 2015-08-02 NOTE — ED Notes (Signed)
[facility] cancelled; [facility] called instead [phone]

## 2015-08-02 NOTE — ED Notes (Addendum)
Sandwich and soft drink given.  

## 2015-08-02 NOTE — ED Notes (Signed)
[facility] called @[phone] 

## 2015-08-02 NOTE — Discharge Instructions (Signed)
[facility]

## 2015-08-02 NOTE — ED Provider Notes (Signed)
CSN: [id number]     Arrival date & time 08/02/15  1524 History   First MD Initiated Contact with Patient 08/02/15 1734     Chief Complaint  Patient presents with  . Abdominal Pain  . Medical Clearance     (Consider location/radiation/quality/duration/timing/severity/associated sxs/prior Treatment) HPI   [name] is a 39 y.o. male with PMH significant for schizophrenia and HTN who presents with gradually worsening auditory hallucinations.  Patient reports that he began hearing voices a couple of weeks ago, but last night it got really bad.  He states they tell him that he can't go to appointments or court dates.  He endorses intermittent suicidal ideations as well, he states he does not have a plan, "but that I wish I'd just get shot in the head".  Denies homicidal ideations.  Nothing makes it better or worse.  He states he is off of his medications.   He also complains of abdominal pain, intermittent N/V, generalized weakness, increased appetite, increased thirst, SOB, and nonbloody diarrhea.  Denies CP, hematuria, or dysuria.  He has no known history of diabetes.   Past Medical History  Diagnosis Date  . Schizophrenic disorder (HCC)   . Boxer's fracture   . Hypertension    History reviewed. No pertinent past surgical history. History reviewed. No pertinent family history. Social History  Substance Use Topics  . Smoking status: Current Every Day Smoker -- 1.00 packs/day for 15 years    Types: Cigarettes  . Smokeless tobacco: Never Used  . Alcohol Use: No     Comment: seldom    Review of Systems All other systems negative unless otherwise stated in HPI    Allergies  Haldol  Home Medications   Prior to Admission medications   Medication Sig Start Date End Date Taking? Authorizing Provider  OLANZapine zydis (ZYPREXA) 10 MG disintegrating tablet Take 1 tablet (10 mg total) by mouth at bedtime. For mood control 05/23/15  Yes Sanjuana Kava, NP  risperiDONE microspheres  (RISPERDAL CONSTA) 37.5 MG injection Inject 37.5 mg into the muscle every 14 (fourteen) days.   Yes Historical Provider, MD  gabapentin (NEURONTIN) 300 MG capsule Take 1 capsule (300 mg total) by mouth 3 (three) times daily. For agitation 05/23/15   Sanjuana Kava, NP  ibuprofen (ADVIL,MOTRIN) 800 MG tablet Take 1 tablet (800 mg total) by mouth 3 (three) times daily. Pain management 05/23/15   Sanjuana Kava, NP  traZODone (DESYREL) 50 MG tablet Take 1 tablet (50 mg total) by mouth at bedtime as needed and may repeat dose one time if needed for sleep. 05/23/15   Sanjuana Kava, NP   BP 107/62 mmHg  Pulse 86  Temp(Src) 98.1 F (36.7 C) (Oral)  Resp 20  Ht 6' (1.829 m)  Wt 236 lb (107.049 kg)  BMI 32.00 kg/m2  SpO2 100% Physical Exam  Constitutional: He is oriented to person, place, and time. He appears well-developed and well-nourished.  HENT:  Head: Normocephalic and atraumatic.  Mouth/Throat: Oropharynx is clear and moist.  Eyes: Conjunctivae are normal. Pupils are equal, round, and reactive to light.  Neck: Normal range of motion. Neck supple.  Cardiovascular: Normal rate, regular rhythm and normal heart sounds.   No murmur heard. Pulmonary/Chest: Effort normal and breath sounds normal. No accessory muscle usage or stridor. No respiratory distress. He has no wheezes. He has no rhonchi. He has no rales.  Abdominal: Soft. Bowel sounds are normal. He exhibits no distension. There is no tenderness.  There is no rebound and no guarding.  Musculoskeletal: Normal range of motion.  Lymphadenopathy:    He has no cervical adenopathy.  Neurological: He is alert and oriented to person, place, and time.  Speech clear without dysarthria.  Strength and sensation intact bilaterally in upper and lower extremities.   Skin: Skin is warm and dry.  Psychiatric: He has a normal mood and affect. His behavior is normal.    ED Course  Procedures (including critical care time) Labs Review Labs Reviewed   COMPREHENSIVE METABOLIC PANEL - Abnormal; Notable for the following:    Potassium 3.4 (*)    BUN <5 (*)    Total Protein 6.4 (*)    ALT 15 (*)    All other components within normal limits  URINE RAPID DRUG SCREEN, HOSP PERFORMED - Abnormal; Notable for the following:    Cocaine POSITIVE (*)    All other components within normal limits  ACETAMINOPHEN LEVEL - Abnormal; Notable for the following:    Acetaminophen (Tylenol), Serum <10 (*)    All other components within normal limits  LIPASE, BLOOD  CBC  URINALYSIS, ROUTINE W REFLEX MICROSCOPIC (NOT AT [facility])  ETHANOL  SALICYLATE LEVEL  CBG MONITORING, ED    Imaging Review No results found. I have personally reviewed and evaluated these images and lab results as part of my medical decision-making.   EKG Interpretation None      MDM   Final diagnoses:  Auditory hallucinations  Acute psychosis   Patient presents with auditory hallucinations and intermittent suicidal ideations along with intermittent abdominal pain.  No plan.  No HI.  VSS, NAD.  Physical exam benign.  Will obtain CMP, CBC, lipase, UA, UDS, ETOH, salicylate, and acetaminophen levels.  Will consult TTS.   Labs unremarkable. UDS positive for cocaine. Per TTS, [facility].   Case has been discussed with and seen by Dr. Denton LankSteinl who agrees with the above plan [facility].     Cheri FowlerKayla Mabel Roll, PA-C 08/02/15 1924  Cathren LaineKevin Steinl, MD 08/02/15 [phone]

## 2015-08-02 NOTE — ED Notes (Signed)
Pt. Noted sleeping in room. No complaints or concerns voiced. No distress or abnormal behavior noted. Will continue to monitor with security cameras. Q 15 minute rounds continue. 

## 2015-08-02 NOTE — ED Notes (Signed)
TTS assessment complete. 

## 2015-08-02 NOTE — ED Notes (Signed)
TTS assessment underway.  

## 2015-08-03 ENCOUNTER — Encounter (HOSPITAL_COMMUNITY): Payer: Self-pay | Admitting: *Deleted

## 2015-08-03 ENCOUNTER — Observation Stay (HOSPITAL_COMMUNITY)
Admission: AD | Admit: 2015-08-03 | Discharge: 2015-08-04 | Disposition: A | Discharge status: Home / Self Care | Payer: Medicare Other | Source: Intra-hospital | Attending: Psychiatry | Admitting: Psychiatry

## 2015-08-03 DIAGNOSIS — F259 Schizoaffective disorder, unspecified: Secondary | ICD-10-CM | POA: Diagnosis not present

## 2015-08-03 DIAGNOSIS — F22 Delusional disorders: Secondary | ICD-10-CM | POA: Diagnosis not present

## 2015-08-03 DIAGNOSIS — F1410 Cocaine abuse, uncomplicated: Secondary | ICD-10-CM | POA: Diagnosis not present

## 2015-08-03 DIAGNOSIS — F250 Schizoaffective disorder, bipolar type: Secondary | ICD-10-CM

## 2015-08-03 DIAGNOSIS — I10 Essential (primary) hypertension: Secondary | ICD-10-CM | POA: Insufficient documentation

## 2015-08-03 DIAGNOSIS — Z888 Allergy status to other drugs, medicaments and biological substances status: Secondary | ICD-10-CM | POA: Insufficient documentation

## 2015-08-03 DIAGNOSIS — F17210 Nicotine dependence, cigarettes, uncomplicated: Secondary | ICD-10-CM | POA: Insufficient documentation

## 2015-08-03 MED ORDER — HYDROXYZINE HCL 25 MG PO TABS: 25.0000 mg | ORAL_TABLET | Freq: Four times a day (QID) | ORAL | Status: DC | PRN

## 2015-08-03 MED ORDER — OLANZAPINE 10 MG PO TBDP: 10.0000 mg | ORAL_TABLET | Freq: Two times a day (BID) | ORAL | Status: DC

## 2015-08-03 MED ORDER — ACETAMINOPHEN 325 MG PO TABS: 650.0000 mg | ORAL_TABLET | Freq: Four times a day (QID) | ORAL | Status: DC | PRN

## 2015-08-03 MED ORDER — ALUM & MAG HYDROXIDE-SIMETH 200-200-20 MG/5ML PO SUSP: 30.0000 mL | ORAL | Status: DC | PRN

## 2015-08-03 MED ORDER — MAGNESIUM HYDROXIDE 400 MG/5ML PO SUSP: 30.0000 mL | Freq: Every day | ORAL | Status: DC | PRN

## 2015-08-03 MED ORDER — TRAZODONE HCL 50 MG PO TABS: 50.0000 mg | ORAL_TABLET | Freq: Every evening | ORAL | Status: DC | PRN

## 2015-08-03 MED ORDER — OLANZAPINE 10 MG PO TBDP
10.0000 mg | ORAL_TABLET | Freq: Two times a day (BID) | ORAL | Status: DC
  Administered 2015-08-03: 10 mg via ORAL
  Filled 2015-08-03 (×2): qty 1

## 2015-08-03 NOTE — ED Notes (Signed)
Dr Lyndee LeoA and Jamison NP into see

## 2015-08-03 NOTE — Progress Notes (Signed)
Patient sleeping at the beginning of the shift. Patient woke up and requested for his Zyprexa. Minimal conversation. Patient went back to sleep. Continues to sleep at this time. Will continue to monitor patient.

## 2015-08-03 NOTE — ED Notes (Signed)
[facility], belongings given to driver.

## 2015-08-03 NOTE — Clinical Social Work Note (Addendum)
CSW met with pt individuallyt. Pt offered little information and reports that he is tired. Denies SI/HI/AVH and demonstrates poor insight. Strong body odor. Pt slightly irritable during interaction. Pt reports med noncomliance but reports that he is followed by [facility] ACTT. Denies SA with hx of crack cocaine abuse. Pt's UDS positive for cocaine.   Maxie Better, [place] Clinical Social Worker 08/03/2015 5:28 PM    Elmarie Shiley NP met with pt. Observe overnight and reassess in the morning.   Maxie Better, [employer] Clinical Social Worker 08/03/2015 5:29 PM

## 2015-08-03 NOTE — ED Notes (Signed)
Pt. Noted sleeping in room. No complaints or concerns voiced. No distress or abnormal behavior noted. Will continue to monitor with security cameras. Q 15 minute rounds continue. 

## 2015-08-03 NOTE — [facility] Counselor (Signed)
Please rescind the last note, is not correct. Ishaq Maffei K. Sherlon HandingHarris, LCAS-A, LPC-A, [facility]  Counselor 08/03/2015 10:31 PM

## 2015-08-03 NOTE — Progress Notes (Signed)
2:18pm. [facility]. Pt signed voluntary paperwork, CSW faxed to assessment office. Pt can go by [place].  Mariann LasterAlexandra Kellin Fifer LCSWA Clinical Social Worker [facility] phone: [phone]
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
## 2015-08-03 NOTE — BHH Counselor (Signed)
This writer was able to speak with patient briefly. This Clinical research associatewriter introduced self and discussed what pt. Plans are upon d/c Patient stated that he wanted to go to [place]-patinet residential treatment on [address] as his primary choice of follow-up/ continued care.  This  Clinical research associatewriter [facility], but was only able to confirm that pt . May meet criteria for treatment even w/o identification card on person, and that there may be some arrangements possible for the pt., but will have to call in a.m. During open hours for intake after 8 a.m.Marland Kitchen. Furthermore patient did express that he would prefer long-term treatment residential as an option to help with his recovery.    This Clinical research associatewriter explored  further resources for pt. And made print outs which can be discussed with pt. In a.m. When he is alert and oriented/ willing to discuss follow up care/ plans which will involve [facility] and [facility] as possibilities for placement. Raaga Maeder K. Sherlon HandingHarris, LCAS-A, LPC-A, [facility]  Counselor 08/03/2015 10:23 PM

## 2015-08-03 NOTE — Progress Notes (Signed)
Nursing admit note-  [facility].  Presents disheveled with body odor stating "I don't need to be here." [facility] "everyone there asking me for money." States he went to the ED because he feared for his life.  Denies SI/HI.  Says he needs resperidol inject this week.  Skin assessment reveals multiple small scabs that he reports could be "bug bites". Observation care explained.  Support offered and fluids offered.  15' checks initiated and pt safe and comfortable.
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
## 2015-08-04 DIAGNOSIS — F259 Schizoaffective disorder, unspecified: Secondary | ICD-10-CM | POA: Diagnosis not present

## 2015-08-04 DIAGNOSIS — F250 Schizoaffective disorder, bipolar type: Secondary | ICD-10-CM | POA: Diagnosis not present

## 2015-08-04 MED ORDER — OLANZAPINE 10 MG PO TBDP: 10.0000 mg | ORAL_TABLET | Freq: Two times a day (BID) | ORAL | Status: DC

## 2015-08-04 MED ORDER — IBUPROFEN 800 MG PO TABS: 800.0000 mg | ORAL_TABLET | Freq: Three times a day (TID) | ORAL | Status: DC

## 2015-08-04 NOTE — [facility] Counselor (Signed)
[facility] Assessment Progress Note  Counselor called pt's ACTT team, [facility] [phone], and spoke with pt's ACTT team member, Judeth CornfieldStephanie. Counselor advised Judeth CornfieldStephanie of pt's impending d/c and Judeth CornfieldStephanie indicated that it would be fine to d/c pt with a bus pass and they would f/u with him later. Judeth CornfieldStephanie also requested that pt's AVS be faxed to them [phone]).   Johny ShockSamantha M. Ladona Ridgelaylor, MS, NCC, LPCA Counselor

## 2015-08-04 NOTE — Discharge Summary (Signed)
[facility] Discharge Summary Note  Patient:  [name] is an 39 y.o., male MRN:  [id number] DOB:  1976/02/17 Patient phone:  [phone] (home)  Patient address:   [place] [address],  Total Time spent with patient: Greater than 30 minutes  Date of Admission:  08/03/2015 Date of Discharge: 08/04/2015 Reason for Admission: Worsening symptoms of Schizoaffective disorder & drug abuse  Principal Problem: Schizoaffective disorder [facility]) Discharge Diagnoses: Patient Active Problem List   Diagnosis Date Noted  . Schizoaffective disorder, unspecified type ([place]) [F25.9]   . Schizoaffective disorder ([place]) [F25.9] 05/08/2013  . Cocaine abuse [F14.10] 11/11/2011   Musculoskeletal: Strength & Muscle Tone: within normal limits Gait & Station: normal Patient leans: N/A  Psychiatric Specialty Exam: Physical Exam  Psychiatric: His behavior is normal. Judgment and thought content normal. His mood appears not anxious. His affect is not angry, not blunt and not labile. Cognition and memory are normal. He does not exhibit a depressed mood.    Review of Systems  Constitutional: Negative.   HENT: Negative.   Eyes: Negative.   Respiratory: Negative.   Cardiovascular: Negative.   Gastrointestinal: Negative.   Genitourinary: Negative.   Musculoskeletal: Negative.   Skin: Negative.   Neurological: Negative.   Endo/Heme/Allergies: Negative.   Psychiatric/Behavioral: Positive for substance abuse (Cocaine dependence). Negative for depression (Stable), hallucinations and memory loss. The patient is not nervous/anxious and does not have insomnia (Stable).     Blood pressure 113/57, pulse 60, temperature 98.6 F (37 C), temperature source Oral, resp. rate 16, height 5' 10" (1.778 m), weight 107.502 kg (237 lb), SpO2 96 %.Body mass index is 34.01 kg/(m^2).  See Md's SRA   Have you used any form of tobacco in the last 30 days? (Cigarettes, Smokeless Tobacco, Cigars, and/or  Pipes): No  Has this patient used any form of tobacco in the last 30 days? (Cigarettes, Smokeless Tobacco, Cigars, and/or Pipes): Yes, Prescription not provided due to patient refusal  Past Medical History:  Past Medical History  Diagnosis Date  . Schizophrenic disorder ([place])   . Boxer's fracture   . Hypertension    History reviewed. No pertinent past surgical history. Family History: History reviewed. No pertinent family history. Social History:  History  Alcohol Use No    Comment: seldom     History  Drug Use No    Social History   Social History  . Marital Status: Single    Spouse Name: N/A  . Number of Children: N/A  . Years of Education: N/A   Social History Main Topics  . Smoking status: Current Every Day Smoker -- 1.00 packs/day for 15 years    Types: Cigarettes  . Smokeless tobacco: Never Used  . Alcohol Use: No     Comment: seldom  . Drug Use: No  . Sexual Activity: No   Other Topics Concern  . None   Social History Narrative   ** Merged History Encounter **       Risk to Self:   Risk to Others: No Prior Inpatient Therapy: Yes Prior Outpatient Therapy: Yes  Level of Care:  OP  Hospital Course:    [name] is an 39 y.o. male. Patient denies SI/HI. Patient denies AVH. Patient reports severe paranoia. Patient states "I feel like people are after me." Patient states "I feel lost" "I need someone to help me because I don't understand people." According to the patient, "my schizophrenia has me messed up." Patient stated he has  not been on his medications. Patient cannot recall his medications. Patient stated he has ACTT with [facility]. Patient just began ACTT services with [facility] last week. According to [place] (ACTT team member), the patient was recently stepped down from ACTT to [facility] outpatient services but was unsuccessful.The patient has been hospitalized at [facility] and [facility]. As a result the patient was re-admitted to  ACTT. The patient denies current SA use but reports past crack cocaine use. On admission his urine drug screen was positive for cocaine.   Patient was admitted to the [facility] for further monitoring and evaluation. He denied suicidal thoughts, psychotic symptoms, or homicidal ideation. Patient was noted to be irritable which is his baseline. [name] was also noted to minimize his substance use stating "I don't know what you are talking about." On assessment complete by writer on 08/04/2015 the continued to be at his baseline with no active psychiatric symptoms noted. The patient verbalized readiness for discharge. The counselor Marisa Cyphers called patient's ACTT team member at [facility] to inform of his discharge. [name] was provided with a bus pass and will follow up with his ACTT team later. Patient left [place] in stable condition with all belongings returned to him.   Consults:  psychiatry  Significant Diagnostic Studies:  UDS positive for cocaine, Chemistry profile, CBC,   Discharge Vitals:   Blood pressure 113/57, pulse 60, temperature 98.6 F (37 C), temperature source Oral, resp. rate 16, height 5' 10" (1.778 m), weight 107.502 kg (237 lb), SpO2 96 %. Body mass index is 34.01 kg/(m^2). Lab Results:   Results for orders placed or performed during the hospital encounter of 08/02/15 (from the past 72 hour(s))  Lipase, blood     Status: None   Collection Time: 08/02/15  4:08 PM  Result Value Ref Range   Lipase 20 11 - 51 U/L  Comprehensive metabolic panel     Status: Abnormal   Collection Time: 08/02/15  4:08 PM  Result Value Ref Range   Sodium 138 135 - 145 mmol/L   Potassium 3.4 (L) 3.5 - 5.1 mmol/L   Chloride 103 101 - 111 mmol/L   CO2 30 22 - 32 mmol/L   Glucose, Bld 84 65 - 99 mg/dL   BUN <5 (L) 6 - 20 mg/dL   Creatinine, Ser 1.16 0.61 - 1.24 mg/dL   Calcium 9.0 8.9 - 10.3 mg/dL   Total Protein 6.4 (L) 6.5 - 8.1 g/dL   Albumin 3.8 3.5 - 5.0 g/dL   AST 21 15 -  41 U/L   ALT 15 (L) 17 - 63 U/L   Alkaline Phosphatase 113 38 - 126 U/L   Total Bilirubin 1.1 0.3 - 1.2 mg/dL   GFR calc non Af Amer >60 >60 mL/min   GFR calc Af Amer >60 >60 mL/min    Comment: (NOTE) The eGFR has been calculated using the CKD EPI equation. This calculation has not been validated in all clinical situations. eGFR's persistently <60 mL/min signify possible Chronic Kidney Disease.    Anion gap 5 5 - 15  CBC     Status: None   Collection Time: 08/02/15  4:08 PM  Result Value Ref Range   WBC 8.2 4.0 - 10.5 K/uL   RBC 4.81 4.22 - 5.81 MIL/uL   Hemoglobin 15.3 13.0 - 17.0 g/dL   HCT 44.0 39.0 - 52.0 %   MCV 91.5 78.0 - 100.0 fL   MCH 31.8 26.0 - 34.0 pg   MCHC  34.8 30.0 - 36.0 g/dL   RDW 12.6 11.5 - 15.5 %   Platelets 214 150 - 400 K/uL  Urinalysis, Routine w reflex microscopic (not at [facility])     Status: None   Collection Time: 08/02/15  5:25 PM  Result Value Ref Range   Color, Urine YELLOW YELLOW   APPearance CLEAR CLEAR   Specific Gravity, Urine 1.010 1.005 - 1.030   pH 6.0 5.0 - 8.0   Glucose, UA NEGATIVE NEGATIVE mg/dL   Hgb urine dipstick NEGATIVE NEGATIVE   Bilirubin Urine NEGATIVE NEGATIVE   Ketones, ur NEGATIVE NEGATIVE mg/dL   Protein, ur NEGATIVE NEGATIVE mg/dL   Urobilinogen, UA 1.0 0.0 - 1.0 mg/dL   Nitrite NEGATIVE NEGATIVE   Leukocytes, UA NEGATIVE NEGATIVE    Comment: MICROSCOPIC NOT DONE ON URINES WITH NEGATIVE PROTEIN, BLOOD, LEUKOCYTES, NITRITE, OR GLUCOSE <1000 mg/dL.  Urine rapid drug screen (hosp performed)not at [facility]     Status: Abnormal   Collection Time: 08/02/15  5:25 PM  Result Value Ref Range   Opiates NONE DETECTED NONE DETECTED   Cocaine POSITIVE (A) NONE DETECTED   Benzodiazepines NONE DETECTED NONE DETECTED   Amphetamines NONE DETECTED NONE DETECTED   Tetrahydrocannabinol NONE DETECTED NONE DETECTED   Barbiturates NONE DETECTED NONE DETECTED    Comment:        DRUG SCREEN FOR MEDICAL PURPOSES ONLY.  IF CONFIRMATION IS  NEEDED FOR ANY PURPOSE, NOTIFY LAB WITHIN 5 DAYS.        LOWEST DETECTABLE LIMITS FOR URINE DRUG SCREEN Drug Class       Cutoff (ng/mL) Amphetamine      1000 Barbiturate      200 Benzodiazepine   161 Tricyclics       096 Opiates          300 Cocaine          300 THC              50   Ethanol     Status: None   Collection Time: 08/02/15  5:57 PM  Result Value Ref Range   Alcohol, Ethyl (B) <5 <5 mg/dL    Comment:        LOWEST DETECTABLE LIMIT FOR SERUM ALCOHOL IS 5 mg/dL FOR MEDICAL PURPOSES ONLY   Salicylate level     Status: None   Collection Time: 08/02/15  5:57 PM  Result Value Ref Range   Salicylate Lvl <0.4 2.8 - 30.0 mg/dL  Acetaminophen level     Status: Abnormal   Collection Time: 08/02/15  5:57 PM  Result Value Ref Range   Acetaminophen (Tylenol), Serum <10 (L) 10 - 30 ug/mL    Comment:        THERAPEUTIC CONCENTRATIONS VARY SIGNIFICANTLY. A RANGE OF 10-30 ug/mL MAY BE AN EFFECTIVE CONCENTRATION FOR MANY PATIENTS. HOWEVER, SOME ARE BEST TREATED AT CONCENTRATIONS OUTSIDE THIS RANGE. ACETAMINOPHEN CONCENTRATIONS >150 ug/mL AT 4 HOURS AFTER INGESTION AND >50 ug/mL AT 12 HOURS AFTER INGESTION ARE OFTEN ASSOCIATED WITH TOXIC REACTIONS.   POC CBG, ED     Status: None   Collection Time: 08/02/15  6:32 PM  Result Value Ref Range   Glucose-Capillary 85 65 - 99 mg/dL   Physical Findings: AIMS: Facial and Oral Movements Muscles of Facial Expression: None, normal Lips and Perioral Area: None, normal Jaw: None, normal Tongue: None, normal,Extremity Movements Upper (arms, wrists, hands, fingers): None, normal Lower (legs, knees, ankles, toes): None, normal, Trunk Movements Neck, shoulders, hips: None, normal, Overall Severity  Severity of abnormal movements (highest score from questions above): None, normal Incapacitation due to abnormal movements: None, normal Patient's awareness of abnormal movements (rate only patient's report): Aware, no distress, Dental  Status Current problems with teeth and/or dentures?: No Does patient usually wear dentures?: No  CIWA:    COWS:     See Psychiatric Specialty Exam and Suicide Risk Assessment completed by Attending Physician prior to discharge.  Discharge destination:  Home  Is patient on multiple antipsychotic therapies at discharge:  No   Has Patient had three or more failed trials of antipsychotic monotherapy by history:  No  Recommended Plan for Multiple Antipsychotic Therapies: NA     Discharge Instructions    Discharge instructions    Complete by:  As directed   Please follow up with the [facility] team as directed after discharge for continued medication management.            Medication List    TAKE these medications      Indication   gabapentin 300 MG capsule  Commonly known as:  NEURONTIN  Take 1 capsule (300 mg total) by mouth 3 (three) times daily. For agitation   Indication:  Agitation     ibuprofen 800 MG tablet  Commonly known as:  ADVIL,MOTRIN  Take 1 tablet (800 mg total) by mouth 3 (three) times daily. Pain management   Indication:  Pain managment     OLANZapine zydis 10 MG disintegrating tablet  Commonly known as:  ZYPREXA  Take 1 tablet (10 mg total) by mouth 2 (two) times daily.   Indication:  Schizoaffective Disorder     traZODone 50 MG tablet  Commonly known as:  DESYREL  Take 1 tablet (50 mg total) by mouth at bedtime as needed and may repeat dose one time if needed for sleep.   Indication:  Trouble Sleeping       Follow-up Information    Follow up with [facility] .   Contact information:   [place]  [place], [place] [id number]  Phone: [phone]      Follow-up recommendations:  Activity:  As tolerated Diet: As recommended by your primary care doctor. Keep all scheduled follow-up appointments as recommended.  Comments: Take all your medications as prescribed by your mental healthcare provider. Report any  adverse effects and or reactions from your medicines to your outpatient provider promptly. Patient is instructed and cautioned to not engage in alcohol and or illegal drug use while on prescription medicines. In the event of worsening symptoms, patient is instructed to call the crisis hotline, 911 and or go to the nearest ED for appropriate evaluation and treatment of symptoms. Follow-up with your primary care provider for your other medical issues, concerns and or health care needs.   Total Discharge Time: Greater than 30 minutes  Signed: Elmarie Shiley, NP-C 08/04/2015, 12:05 PM  I

## 2015-08-04 NOTE — Progress Notes (Signed)
Patient ID: Carl GoreBrad T [name], male   DOB: [birth year], 39 y.o.   MRN: [id number] Discharge Note-Laura NP saw patient this am and the decision made for him to be discharged. He is in agreement. He didn't take his Zyprexa today, states he takes it at night and would not take it before leaving. He is verbal and pleasant and conversation is appropriate. He has an ACTT to follow up with and they were notified of his leaving by Lelon MastSamantha, OBS counselor. He was given a bus ticket at his request. He denies any thoughts to hurt self or others. All property returned to him. Clothes smell badly. He starts getting disability this month or next and hopes he can get himself some new clothes. States he has no way to do laundry. Escorted to lobby for discharge home via bus.

## 2015-09-28 DIAGNOSIS — F209 Schizophrenia, unspecified: Secondary | ICD-10-CM | POA: Diagnosis not present

## 2015-09-29 DIAGNOSIS — F209 Schizophrenia, unspecified: Secondary | ICD-10-CM | POA: Diagnosis not present

## 2015-09-30 DIAGNOSIS — F209 Schizophrenia, unspecified: Secondary | ICD-10-CM | POA: Diagnosis not present

## 2015-10-01 DIAGNOSIS — F209 Schizophrenia, unspecified: Secondary | ICD-10-CM | POA: Diagnosis not present

## 2015-10-02 DIAGNOSIS — F209 Schizophrenia, unspecified: Secondary | ICD-10-CM | POA: Diagnosis not present

## 2015-10-31 DIAGNOSIS — M5441 Lumbago with sciatica, right side: Secondary | ICD-10-CM | POA: Diagnosis not present

## 2015-10-31 DIAGNOSIS — F17210 Nicotine dependence, cigarettes, uncomplicated: Secondary | ICD-10-CM | POA: Diagnosis not present

## 2015-10-31 DIAGNOSIS — G8929 Other chronic pain: Secondary | ICD-10-CM | POA: Diagnosis not present

## 2015-10-31 DIAGNOSIS — M545 Low back pain: Secondary | ICD-10-CM | POA: Diagnosis not present

## 2015-10-31 DIAGNOSIS — R062 Wheezing: Secondary | ICD-10-CM | POA: Diagnosis not present

## 2015-11-29 ENCOUNTER — Emergency Department (HOSPITAL_COMMUNITY)
Admission: EM | Admit: 2015-11-29 | Discharge: 2015-12-02 | Payer: Medicare Other | Attending: Emergency Medicine | Admitting: Emergency Medicine

## 2015-11-29 ENCOUNTER — Encounter (HOSPITAL_COMMUNITY): Payer: Self-pay | Admitting: *Deleted

## 2015-11-29 DIAGNOSIS — R45851 Suicidal ideations: Secondary | ICD-10-CM | POA: Diagnosis not present

## 2015-11-29 DIAGNOSIS — Z5900 Homelessness unspecified: Secondary | ICD-10-CM | POA: Insufficient documentation

## 2015-11-29 DIAGNOSIS — I10 Essential (primary) hypertension: Secondary | ICD-10-CM | POA: Diagnosis not present

## 2015-11-29 DIAGNOSIS — F1410 Cocaine abuse, uncomplicated: Secondary | ICD-10-CM | POA: Diagnosis not present

## 2015-11-29 DIAGNOSIS — F1994 Other psychoactive substance use, unspecified with psychoactive substance-induced mood disorder: Secondary | ICD-10-CM | POA: Diagnosis not present

## 2015-11-29 DIAGNOSIS — F209 Schizophrenia, unspecified: Secondary | ICD-10-CM | POA: Insufficient documentation

## 2015-11-29 DIAGNOSIS — Z8781 Personal history of (healed) traumatic fracture: Secondary | ICD-10-CM | POA: Diagnosis not present

## 2015-11-29 DIAGNOSIS — M79606 Pain in leg, unspecified: Secondary | ICD-10-CM | POA: Diagnosis not present

## 2015-11-29 DIAGNOSIS — Z791 Long term (current) use of non-steroidal anti-inflammatories (NSAID): Secondary | ICD-10-CM | POA: Insufficient documentation

## 2015-11-29 DIAGNOSIS — Z79899 Other long term (current) drug therapy: Secondary | ICD-10-CM | POA: Insufficient documentation

## 2015-11-29 DIAGNOSIS — F17210 Nicotine dependence, cigarettes, uncomplicated: Secondary | ICD-10-CM | POA: Insufficient documentation

## 2015-11-29 DIAGNOSIS — F29 Unspecified psychosis not due to a substance or known physiological condition: Secondary | ICD-10-CM | POA: Diagnosis not present

## 2015-11-29 LAB — COMPREHENSIVE METABOLIC PANEL
ALBUMIN: 4.8 g/dL (ref 3.5–5.0)
ALK PHOS: 120 U/L (ref 38–126)
ALT: 15 U/L — AB (ref 17–63)
ANION GAP: 12 (ref 5–15)
AST: 25 U/L (ref 15–41)
BUN: 9 mg/dL (ref 6–20)
CALCIUM: 9.6 mg/dL (ref 8.9–10.3)
CO2: 24 mmol/L (ref 22–32)
Chloride: 102 mmol/L (ref 101–111)
Creatinine, Ser: 0.93 mg/dL (ref 0.61–1.24)
GFR calc Af Amer: >60 mL/min (ref >60)
GFR calc non Af Amer: >60 mL/min (ref >60)
GLUCOSE: 147 mg/dL — AB (ref 65–99)
Potassium: 3.4 mmol/L — ABNORMAL LOW (ref 3.5–5.1)
SODIUM: 138 mmol/L (ref 135–145)
Total Bilirubin: 1.1 mg/dL (ref 0.3–1.2)
Total Protein: 7.8 g/dL (ref 6.5–8.1)

## 2015-11-29 LAB — SALICYLATE LEVEL: Salicylate Lvl: <4 mg/dL (ref 2.8–30.0)

## 2015-11-29 LAB — ACETAMINOPHEN LEVEL

## 2015-11-29 LAB — CBC
HEMATOCRIT: 46 % (ref 39.0–52.0)
HEMOGLOBIN: 16.1 g/dL (ref 13.0–17.0)
MCH: 32.7 pg (ref 26.0–34.0)
MCHC: 35 g/dL (ref 30.0–36.0)
MCV: 93.5 fL (ref 78.0–100.0)
Platelets: 206 10*3/uL (ref 150–400)
RBC: 4.92 MIL/uL (ref 4.22–5.81)
RDW: 13.2 % (ref 11.5–15.5)
WBC: 8.3 10*3/uL (ref 4.0–10.5)

## 2015-11-29 LAB — RAPID URINE DRUG SCREEN, HOSP PERFORMED
Amphetamines: NOT DETECTED
BARBITURATES: NOT DETECTED
BENZODIAZEPINES: NOT DETECTED
COCAINE: POSITIVE — AB
Opiates: NOT DETECTED
TETRAHYDROCANNABINOL: NOT DETECTED

## 2015-11-29 LAB — ETHANOL: Alcohol, Ethyl (B): <5 mg/dL (ref <5)

## 2015-11-29 MED ORDER — GABAPENTIN 300 MG PO CAPS
300.0000 mg | ORAL_CAPSULE | Freq: Three times a day (TID) | ORAL | Status: DC
  Administered 2015-11-29 – 2015-12-02 (×10): 300 mg via ORAL
  Filled 2015-11-29 (×10): qty 1

## 2015-11-29 MED ORDER — LORAZEPAM 1 MG PO TABS: 1.0000 mg | ORAL_TABLET | Freq: Three times a day (TID) | ORAL | Status: DC | PRN

## 2015-11-29 MED ORDER — OLANZAPINE 10 MG PO TBDP
10.0000 mg | ORAL_TABLET | Freq: Two times a day (BID) | ORAL | Status: DC
  Administered 2015-11-29 – 2015-12-02 (×6): 10 mg via ORAL
  Filled 2015-11-29 (×6): qty 1

## 2015-11-29 MED ORDER — ACETAMINOPHEN 325 MG PO TABS: 650.0000 mg | ORAL_TABLET | ORAL | Status: DC | PRN

## 2015-11-29 MED ORDER — TRAZODONE HCL 50 MG PO TABS: 50.0000 mg | ORAL_TABLET | Freq: Every evening | ORAL | Status: DC | PRN

## 2015-11-29 MED ORDER — IBUPROFEN 200 MG PO TABS
600.0000 mg | ORAL_TABLET | Freq: Three times a day (TID) | ORAL | Status: DC | PRN
  Administered 2015-12-01: 600 mg via ORAL
  Filled 2015-11-29: qty 3

## 2015-11-29 MED ORDER — NICOTINE 21 MG/24HR TD PT24
21.0000 mg | MEDICATED_PATCH | Freq: Every day | TRANSDERMAL | Status: DC
  Filled 2015-11-29: qty 1

## 2015-11-29 MED ORDER — ZOLPIDEM TARTRATE 10 MG PO TABS: 10.0000 mg | ORAL_TABLET | Freq: Every evening | ORAL | Status: DC | PRN

## 2015-11-29 MED ORDER — ONDANSETRON HCL 4 MG PO TABS: 4.0000 mg | ORAL_TABLET | Freq: Three times a day (TID) | ORAL | Status: DC | PRN

## 2015-11-29 MED ORDER — ALUM & MAG HYDROXIDE-SIMETH 200-200-20 MG/5ML PO SUSP: 30.0000 mL | ORAL | Status: DC | PRN

## 2015-11-29 NOTE — ED Notes (Signed)
Pt. Noted sleeping in room. No complaints or concerns voiced. No distress or abnormal behavior noted. Will continue to monitor with security cameras. Q 15 minute rounds continue. 

## 2015-11-29 NOTE — BH Assessment (Signed)
Assessment completed. Consulted Alberteen SamFran Hobson, NP who recommended that pt be evaluated by psychiatry in the morning. Informed Dr. Lynelle DoctorKnapp of the recommendation.

## 2015-11-29 NOTE — ED Notes (Signed)
Up to the bathroom 

## 2015-11-29 NOTE — BH Assessment (Signed)
Tele Assessment Note   [name] is an 40 y.o. male presenting to [facility] reporting suicidal ideations as well as auditory hallucinations. Pt reported suicidal ideations but denied having a plan. Pt reported that he has attempted suicide in the past by cutting his wrist. Pt is currently receiving mental health treatment through [facility] and shared that he has had multiple inpatient admissions. Pt is reporting multiple depressive symptoms and shared that he is dealing with some stressors such as not having a bed in the rooming house. Pt reported that he is not sleeping well and reported that his appetite is poor. Pt stated "I eat 1-2 meals per week".  Pt denies HI at this time. Pt is reporting auditory and visual hallucinations and reported that he is hearing his name being called as well as seeing shadows in the room. Pt did not report any alcohol or illicit substance abuse. Pt's UDS is pending. Pt did not report any physical, sexual or emotional abuse at this time.   Diagnosis: Schizoaffective disorder    Past Medical History:  Past Medical History  Diagnosis Date  . Schizophrenic disorder ([facility])   . Boxer's fracture   . Hypertension     History reviewed. No pertinent past surgical history.  Family History: No family history on file.  Social History:  reports that he has been smoking Cigarettes.  He has a 15 pack-year smoking history. He has never used smokeless tobacco. He reports that he does not drink alcohol or use illicit drugs.  Additional Social History:  Alcohol / Drug Use History of alcohol / drug use?:  (Pt denies use. UDS is pending )  CIWA: CIWA-Ar BP: 142/91 mmHg Pulse Rate: 103 COWS:    PATIENT STRENGTHS: (choose at least two) Average or above average intelligence Motivation for treatment/growth  Allergies:  Allergies  Allergen Reactions  . Haldol [Haloperidol Decanoate] Other (See Comments)    'I curl up"    Home Medications:  (Not in a hospital  admission)  OB/GYN Status:  No LMP for male patient.  General Assessment Data Location of Assessment: [facility] TTS Assessment: In system Is this a Tele or Face-to-Face Assessment?: Face-to-Face Is this an Initial Assessment or a Re-assessment for this encounter?: Initial Assessment Marital status: Single Living Arrangements: Other (Comment) (Homeless) Can pt return to current living arrangement?: Yes Admission Status: Voluntary Is patient capable of signing voluntary admission?: Yes Referral Source: Self/Family/Friend Insurance type: Medicare     Crisis Care Plan Living Arrangements: Other (Comment) (Homeless) Name of Psychiatrist: [facility]  Name of Therapist: [facility]   Education Status Is patient currently in school?: No Current Grade: N/A Highest grade of school patient has completed: N/A Name of school: N/A Contact person: N/A  Risk to self with the past 6 months Suicidal Ideation: Yes-Currently Present Has patient been a risk to self within the past 6 months prior to admission? : No Suicidal Intent: No Has patient had any suicidal intent within the past 6 months prior to admission? : No Is patient at risk for suicide?: Yes Suicidal Plan?: No Has patient had any suicidal plan within the past 6 months prior to admission? : No Access to Means: No What has been your use of drugs/alcohol within the last 12 months?: Pt denies any use; UDS is pending. Previous Attempts/Gestures: Yes How many times?: 1 Other Self Harm Risks: PT denies  Triggers for Past Attempts: Unpredictable Intentional Self Injurious Behavior: None Family Suicide History: No Recent stressful  life event(s): Other (Comment) (Housing ) Persecutory voices/beliefs?: No Depression: Yes Depression Symptoms: Insomnia, Despondent, Tearfulness, Isolating, Guilt, Fatigue, Loss of interest in usual pleasures, Feeling worthless/self pity, Feeling angry/irritable Substance abuse history and/or  treatment for substance abuse?: Yes Suicide prevention information given to non-admitted patients: Not applicable  Risk to Others within the past 6 months Homicidal Ideation: No Does patient have any lifetime risk of violence toward others beyond the six months prior to admission? : No Thoughts of Harm to Others: No Current Homicidal Intent: No Current Homicidal Plan: No Access to Homicidal Means: No Identified Victim: N/A History of harm to others?: No Assessment of Violence: None Noted Violent Behavior Description: No violent behaviors observed. Pt is calm and cooperative at this time.  Does patient have access to weapons?: No Criminal Charges Pending?: No Does patient have a court date: No Is patient on probation?: No  Psychosis Hallucinations: Auditory, Visual Delusions: None noted  Mental Status Report Appearance/Hygiene: In scrubs, Disheveled Eye Contact: Good Motor Activity: Freedom of movement Speech: Logical/coherent Level of Consciousness: Quiet/awake Mood: Depressed Affect: Appropriate to circumstance Anxiety Level: Minimal Thought Processes: Coherent, Relevant Judgement: Unimpaired Orientation: Appropriate for developmental age Obsessive Compulsive Thoughts/Behaviors: None  Cognitive Functioning Concentration: Fair Memory: Recent Intact, Remote Intact IQ: Average Insight: Fair Impulse Control: Fair Appetite: Good Weight Loss: 0 Weight Gain: 0 Sleep: Decreased Total Hours of Sleep:  ("no sleep" ) Vegetative Symptoms: Decreased grooming, Not bathing  ADLScreening [facility]) Patient's cognitive ability adequate to safely complete daily activities?: Yes Patient able to express need for assistance with ADLs?: Yes Independently performs ADLs?: Yes (appropriate for developmental age)  Prior Inpatient Therapy Prior Inpatient Therapy: Yes Prior Therapy Dates: 2003, 2004, 2005, 2006, 2007, 2008, 2009, 2011, 2012, 2013, 2014 Prior Therapy  [facility] Reason for Treatment: Paranoid Schizophrenia, cocaine abuse   Prior Outpatient Therapy Prior Outpatient Therapy: Yes Prior Therapy Dates: Current  Prior Therapy Facilty/Provider(s): [facility]  Reason for Treatment: Medication management  Does patient have an ACCT team?: Yes Does patient have Intensive In-House Services?  : No Does patient have [facility] services? : No Does patient have P4CC services?: No  ADL Screening (condition at time of admission) Patient's cognitive ability adequate to safely complete daily activities?: Yes Is the patient deaf or have difficulty hearing?: No Does the patient have difficulty seeing, even when wearing glasses/contacts?: No Does the patient have difficulty concentrating, remembering, or making decisions?: No Patient able to express need for assistance with ADLs?: Yes Does the patient have difficulty dressing or bathing?: No Independently performs ADLs?: Yes (appropriate for developmental age)       Abuse/Neglect Assessment (Assessment to be complete while patient is alone) Physical Abuse: Denies Verbal Abuse: Denies Sexual Abuse: Denies Exploitation of patient/patient's resources: Denies Self-Neglect: Denies     Merchant navy officerAdvance Directives (For Healthcare) Does patient have an advance directive?: No Would patient like information on creating an advanced directive?: No - patient declined information    Additional Information 1:1 In Past 12 Months?: No CIRT Risk: No Elopement Risk: No Does patient have medical clearance?: Yes     Disposition:  Disposition Initial Assessment Completed for this Encounter: Yes Disposition of Patient: Other dispositions Other disposition(s): Other (Comment) (AM Psych eval )  Asante Ritacco S 11/29/2015 4:05 AM

## 2015-11-29 NOTE — ED Notes (Signed)
Dr J and Shovon NP into see 

## 2015-11-29 NOTE — ED Notes (Signed)
Pt. Noted in room. No complaints or concerns voiced. No distress or abnormal behavior noted. Will continue to monitor with security cameras. Q 15 minute rounds continue. 

## 2015-11-29 NOTE — ED Notes (Signed)
Pt arrives via ems, initially, pt was c/o bilateral leg pain. En route to the facility pt was c/o suicidal thoughts.

## 2015-11-29 NOTE — ED Notes (Signed)
Pt states "my legs hurt, something is wrong with my feet, i'm suicidal, my breathing is not right, my meds are not working, I have not ate in 3 days, I don't know if I'm trying to end it or what". No specific SI plan in triage.

## 2015-11-29 NOTE — ED Notes (Signed)
Staffing notified of sitter need 

## 2015-11-29 NOTE — ED Notes (Signed)
Snack and beverage given. 

## 2015-11-29 NOTE — ED Notes (Signed)
Report received from Lizbeth BarkJanie Rambo RN. Pt. Alert and oriented in no distress denies HI, AVH and pain. Pt. States he still has SI without plan. Pt. Instructed to come to me with problems or concerns.Will continue to monitor for safety via security cameras and Q 15 minute checks.

## 2015-11-29 NOTE — ED Notes (Signed)
Pt. In shower. 

## 2015-11-29 NOTE — ED Provider Notes (Signed)
CSN: [id number]     Arrival date & time 11/29/15  0125 History   By signing my name below, I, [name], attest that this documentation has been prepared under the direction and in the presence of [name], MD at [facility].  Electronically Signed: Arlan OrganAshley [name], ED Scribe. 11/29/2015. 3:15 AM.   Chief Complaint  Patient presents with  . Suicidal   The history is provided by the patient. No language interpreter was used.    HPI Comments: [name] brought in by EMS is a 40 y.o. male with a PMHx of schizophrenic disorder and HTN who presents to the Emergency Department here for suicidal ideation this evening. Per triage note, pt states "my legs hurt, something is wrong with my feet, I'm suicidal, my breathing is not right, my meds are not working, I have not ate in 3 days, I don't known if I'm trying to end it or what". Pt denies any plan at this time. No recent fever, chills, or abdominal pain.   When I asked the patient why he is here he states "I don't know". When I ask him what is wrong he states "I don't feel good". The only thing he told me is that he is stiff. When I ask him if he suicidal he states "I've been like this" and then he starts cussing. He has no plan.  PCP: [name],[name], MD    Past Medical History  Diagnosis Date  . Schizophrenic disorder (HCC)   . Boxer's fracture   . Hypertension    History reviewed. No pertinent past surgical history. No family history on file. Social History  Substance Use Topics  . Smoking status: Current Every Day Smoker -- 1.00 packs/day for 15 years    Types: Cigarettes  . Smokeless tobacco: Never Used  . Alcohol Use: No     Comment: seldom  suspect homeless  Review of Systems  Unable to perform ROS: Psychiatric disorder  Constitutional: Negative for fever and chills.  Cardiovascular: Negative for chest pain.  Gastrointestinal: Negative for nausea, vomiting and abdominal pain.  Musculoskeletal: Positive for arthralgias.   Neurological: Negative for headaches.  Psychiatric/Behavioral: Positive for suicidal ideas.      Allergies  Haldol  Home Medications   Prior to Admission medications   Medication Sig Start Date End Date Taking? Authorizing Provider  gabapentin (NEURONTIN) 300 MG capsule Take 1 capsule (300 mg total) by mouth 3 (three) times daily. For agitation 05/23/15   [name], NP  ibuprofen (ADVIL,MOTRIN) 800 MG tablet Take 1 tablet (800 mg total) by mouth 3 (three) times daily. Pain management 08/04/15   [name], NP  OLANZapine zydis (ZYPREXA) 10 MG disintegrating tablet Take 1 tablet (10 mg total) by mouth 2 (two) times daily. 08/04/15   [name], NP  traZODone (DESYREL) 50 MG tablet Take 1 tablet (50 mg total) by mouth at bedtime as needed and may repeat dose one time if needed for sleep. 05/23/15   [name], NP   Triage Vitals: BP 142/91 mmHg  Pulse 103  Temp(Src) 97.4 F (36.3 C) (Oral)  Resp 16  Ht 6\' 1"  (1.854 m)  Wt 270 lb (122.471 kg)  BMI 35.63 kg/m2  SpO2 100%  Vital signs normal except for tachycardia    Physical Exam  Constitutional: He is oriented to person, place, and time. He appears well-developed and well-nourished.  Non-toxic appearance. He does not appear ill. No distress.  Sleeping Hard to awaken  HENT:  Head: Normocephalic and atraumatic.  Right Ear: External ear normal.  Left Ear: External ear normal.  Nose: Nose normal. No mucosal edema or rhinorrhea.  Mouth/Throat: Oropharynx is clear and moist and mucous membranes are normal. No dental abscesses or uvula swelling.  Eyes: Conjunctivae and EOM are normal. Pupils are equal, round, and reactive to light.  Neck: Normal range of motion and full passive range of motion without pain. Neck supple.  Cardiovascular: Normal rate, regular rhythm and normal heart sounds.  Exam reveals no gallop and no friction rub.   No murmur heard. Pulmonary/Chest: Effort normal and breath sounds normal. No  respiratory distress. He has no wheezes. He has no rhonchi. He has no rales. He exhibits no tenderness and no crepitus.  Abdominal: Soft. Normal appearance and bowel sounds are normal. He exhibits no distension. There is no tenderness. There is no rebound and no guarding.  Musculoskeletal: Normal range of motion. He exhibits no edema or tenderness.  Moves all extremities well.   Neurological: He is alert and oriented to person, place, and time. He has normal strength. No cranial nerve deficit.  Skin: Skin is warm, dry and intact. No rash noted. No erythema. No pallor.  Psychiatric: His mood appears not anxious.  Slurred speech  Did not mention he was SI until I said I was going to discharge him   Nursing note and vitals reviewed.   ED Course  Procedures (including critical care time)  Medications  LORazepam (ATIVAN) tablet 1 mg (not administered)  acetaminophen (TYLENOL) tablet 650 mg (not administered)  ibuprofen (ADVIL,MOTRIN) tablet 600 mg (not administered)  zolpidem (AMBIEN) tablet 10 mg (not administered)  nicotine (NICODERM CQ - dosed in mg/24 hours) patch 21 mg (not administered)  ondansetron (ZOFRAN) tablet 4 mg (not administered)  alum & mag hydroxide-simeth (MAALOX/MYLANTA) 200-200-20 MG/5ML suspension 30 mL (not administered)  gabapentin (NEURONTIN) capsule 300 mg (not administered)  OLANZapine zydis (ZYPREXA) disintegrating tablet 10 mg (not administered)  traZODone (DESYREL) tablet 50 mg (not administered)     DIAGNOSTIC STUDIES: Oxygen Saturation is 100% on RA, Normal by my interpretation.    COORDINATION OF CARE: 2:51 AM- It is not clear why patient is in the ED tonight. Patient just wants to sleep and be left alone. To me it appears he is just homeless (it is in the 20's tonight)  and he doesn't have a place to stay. However he did tell the triage nurse he was feeling suicidal therefore he had psych holding orders done.   04:04 [name], [name], has seen patient, is  going to have psychiatrist evaluate in am.    Labs Review Results for orders placed or performed during the hospital encounter of 11/29/15  Comprehensive metabolic panel  Result Value Ref Range   Sodium 138 135 - 145 mmol/L   Potassium 3.4 (L) 3.5 - 5.1 mmol/L   Chloride 102 101 - 111 mmol/L   CO2 24 22 - 32 mmol/L   Glucose, Bld 147 (H) 65 - 99 mg/dL   BUN 9 6 - 20 mg/dL   Creatinine, Ser 1.61 0.61 - 1.24 mg/dL   Calcium 9.6 8.9 - 09.6 mg/dL   Total Protein 7.8 6.5 - 8.1 g/dL   Albumin 4.8 3.5 - 5.0 g/dL   AST 25 15 - 41 U/L   ALT 15 (L) 17 - 63 U/L   Alkaline Phosphatase 120 38 - 126 U/L   Total Bilirubin 1.1 0.3 - 1.2 mg/dL   GFR calc non Af  Amer >60 >60 mL/min   GFR calc Af Amer >60 >60 mL/min   Anion gap 12 5 - 15  Ethanol (ETOH)  Result Value Ref Range   Alcohol, Ethyl (B) <5 <5 mg/dL  Salicylate level  Result Value Ref Range   Salicylate Lvl <4.0 2.8 - 30.0 mg/dL  Acetaminophen level  Result Value Ref Range   Acetaminophen (Tylenol), Serum <10 (L) 10 - 30 ug/mL  CBC  Result Value Ref Range   WBC 8.3 4.0 - 10.5 K/uL   RBC 4.92 4.22 - 5.81 MIL/uL   Hemoglobin 16.1 13.0 - 17.0 g/dL   HCT 40.9 81.1 - 91.4 %   MCV 93.5 78.0 - 100.0 fL   MCH 32.7 26.0 - 34.0 pg   MCHC 35.0 30.0 - 36.0 g/dL   RDW 78.2 95.6 - 21.3 %   Platelets 206 150 - 400 K/uL   Laboratory interpretation all normal except mild hypokalemia      MDM   Final diagnoses:  Suicidal ideation  Homeless   Disposition pending  Devoria Albe, MD, FACEP   I personally performed the services described in this documentation, which was scribed in my presence. The recorded information has been reviewed and considered.  Devoria Albe, MD, Concha Pyo, MD 11/29/15 [phone]

## 2015-11-29 NOTE — Consult Note (Signed)
[facility] Face-to-Face Psychiatry Consult   Reason for Consult:  Suicidal ideation Referring Physician:  EDP Patient Identification: [name] MRN:  [id number] Principal Diagnosis: Substance induced mood disorder ([place]) Diagnosis:   Patient Active Problem List   Diagnosis Date Noted  . Substance induced mood disorder ([place]) [F19.94] 11/29/2015  . Schizoaffective disorder, unspecified type ([place]) [F25.9]   . Schizoaffective disorder ([place]) [F25.9] 05/08/2013  . Cocaine abuse [F14.10] 11/11/2011    Total Time spent with patient: 30 minutes  Subjective:   [name] is a 40 y.o. male patient admitted to [facility] with complaints of suicidal ideation with no plan.  HPI:  [name] 40 yr old white male present to [facility] with complaints of hearing voices and suicidal ideation.  Patient in bed sleeping prior to interview and angry that he was woken up for interview.  No eye contact.  Patient reports that he has been off of his medication for 1 week related to running out.  States that he has an ACT team with [facility] but only sees them every 2 weeks.  Patient endorses passive suicidal thoughts without a plan; denies homicidal ideation and psychosis at this time.  Patient states that he is homeless and is having some issues and just wants to get better.   Past Psychiatric History: Substance abuse; Schizoaffective Disorder; multiple inpatient admissions. Current outpatient services is with [facility].  Risk to Self: Suicidal Ideation: Yes-Currently Present Suicidal Intent: No Is patient at risk for suicide?: Yes Suicidal Plan?: No Access to Means: No What has been your use of drugs/alcohol within the last 12 months?: Pt denies any use; UDS is pending. How many times?: 1 Other Self Harm Risks: PT denies  Triggers for Past Attempts: Unpredictable Intentional Self Injurious Behavior: None Risk to Others: Homicidal Ideation: No Thoughts of Harm to Others: No Current Homicidal Intent: No Current Homicidal Plan:  No Access to Homicidal Means: No Identified Victim: N/A History of harm to others?: No Assessment of Violence: None Noted Violent Behavior Description: No violent behaviors observed. Pt is calm and cooperative at this time.  Does patient have access to weapons?: No Criminal Charges Pending?: No Does patient have a court date: No Prior Inpatient Therapy: Prior Inpatient Therapy: Yes Prior Therapy Dates: 2003, 2004, 2005, 2006, 2007, 2008, 2009, 2011, 2012, 2013, 2014 Prior Therapy Facilty/Provider(s): [facility] Reason for Treatment: Paranoid Schizophrenia, cocaine abuse  Prior Outpatient Therapy: Prior Outpatient Therapy: Yes Prior Therapy Dates: Current  Prior Therapy Facilty/Provider(s): [facility]  Reason for Treatment: Medication management  Does patient have an ACCT team?: Yes Does patient have Intensive In-House Services?  : No Does patient have [facility] services? : No Does patient have P4CC services?: No  Past Medical History:  Past Medical History  Diagnosis Date  . Schizophrenic disorder ([place])   . Boxer's fracture   . Hypertension    History reviewed. No pertinent past surgical history. Family History: History reviewed. No pertinent family history. Family Psychiatric  History: Would not answer Social History:  History  Alcohol Use No    Comment: seldom     History  Drug Use No    Social History   Social History  . Marital Status: Single    Spouse Name: N/A  . Number of Children: N/A  . Years of Education: N/A   Social History Main Topics  . Smoking status: Current Every Day Smoker -- 1.00 packs/day for 15 years    Types: Cigarettes  . Smokeless tobacco: Never Used  .  Alcohol Use: No     Comment: seldom  . Drug Use: No  . Sexual Activity: No   Other Topics Concern  . None   Social History Narrative   ** Merged History Encounter **       Additional Social History:    Allergies:   Allergies  Allergen Reactions  . Haldol [Haloperidol Decanoate]  Other (See Comments)    'I curl up"    Labs:  Results for orders placed or performed during the hospital encounter of 11/29/15 (from the past 48 hour(s))  Comprehensive metabolic panel     Status: Abnormal   Collection Time: 11/29/15  1:42 AM  Result Value Ref Range   Sodium 138 135 - 145 mmol/L   Potassium 3.4 (L) 3.5 - 5.1 mmol/L   Chloride 102 101 - 111 mmol/L   CO2 24 22 - 32 mmol/L   Glucose, Bld 147 (H) 65 - 99 mg/dL   BUN 9 6 - 20 mg/dL   Creatinine, Ser 0.93 0.61 - 1.24 mg/dL   Calcium 9.6 8.9 - 10.3 mg/dL   Total Protein 7.8 6.5 - 8.1 g/dL   Albumin 4.8 3.5 - 5.0 g/dL   AST 25 15 - 41 U/L   ALT 15 (L) 17 - 63 U/L   Alkaline Phosphatase 120 38 - 126 U/L   Total Bilirubin 1.1 0.3 - 1.2 mg/dL   GFR calc non Af Amer >60 >60 mL/min   GFR calc Af Amer >60 >60 mL/min    Comment: (NOTE) The eGFR has been calculated using the CKD EPI equation. This calculation has not been validated in all clinical situations. eGFR's persistently <60 mL/min signify possible Chronic Kidney Disease.    Anion gap 12 5 - 15  Ethanol (ETOH)     Status: None   Collection Time: 11/29/15  1:42 AM  Result Value Ref Range   Alcohol, Ethyl (B) <5 <5 mg/dL    Comment:        LOWEST DETECTABLE LIMIT FOR SERUM ALCOHOL IS 5 mg/dL FOR MEDICAL PURPOSES ONLY   Salicylate level     Status: None   Collection Time: 11/29/15  1:42 AM  Result Value Ref Range   Salicylate Lvl <1.6 2.8 - 30.0 mg/dL  Acetaminophen level     Status: Abnormal   Collection Time: 11/29/15  1:42 AM  Result Value Ref Range   Acetaminophen (Tylenol), Serum <10 (L) 10 - 30 ug/mL    Comment:        THERAPEUTIC CONCENTRATIONS VARY SIGNIFICANTLY. A RANGE OF 10-30 ug/mL MAY BE AN EFFECTIVE CONCENTRATION FOR MANY PATIENTS. HOWEVER, SOME ARE BEST TREATED AT CONCENTRATIONS OUTSIDE THIS RANGE. ACETAMINOPHEN CONCENTRATIONS >150 ug/mL AT 4 HOURS AFTER INGESTION AND >50 ug/mL AT 12 HOURS AFTER INGESTION ARE OFTEN ASSOCIATED WITH  TOXIC REACTIONS.   CBC     Status: None   Collection Time: 11/29/15  1:42 AM  Result Value Ref Range   WBC 8.3 4.0 - 10.5 K/uL   RBC 4.92 4.22 - 5.81 MIL/uL   Hemoglobin 16.1 13.0 - 17.0 g/dL   HCT 46.0 39.0 - 52.0 %   MCV 93.5 78.0 - 100.0 fL   MCH 32.7 26.0 - 34.0 pg   MCHC 35.0 30.0 - 36.0 g/dL   RDW 13.2 11.5 - 15.5 %   Platelets 206 150 - 400 K/uL    Current Facility-Administered Medications  Medication Dose Route Frequency Provider Last Rate Last Dose  . acetaminophen (TYLENOL) tablet 650 mg  650  mg Oral Q4H PRN Devoria Albe, MD      . alum & mag hydroxide-simeth (MAALOX/MYLANTA) 200-200-20 MG/5ML suspension 30 mL  30 mL Oral PRN Devoria Albe, MD      . gabapentin (NEURONTIN) capsule 300 mg  300 mg Oral TID Devoria Albe, MD   300 mg at 11/29/15 1642  . ibuprofen (ADVIL,MOTRIN) tablet 600 mg  600 mg Oral Q8H PRN Devoria Albe, MD      . nicotine (NICODERM CQ - dosed in mg/24 hours) patch 21 mg  21 mg Transdermal Daily Devoria Albe, MD   21 mg at 11/29/15 1051  . OLANZapine zydis (ZYPREXA) disintegrating tablet 10 mg  10 mg Oral BID Devoria Albe, MD   10 mg at 11/29/15 0815  . ondansetron (ZOFRAN) tablet 4 mg  4 mg Oral Q8H PRN Devoria Albe, MD      . traZODone (DESYREL) tablet 50 mg  50 mg Oral QHS PRN,MR X 1 Devoria Albe, MD       Current Outpatient Prescriptions  Medication Sig Dispense Refill  . OLANZapine zydis (ZYPREXA) 20 MG disintegrating tablet Take 20 mg by mouth at bedtime.    . risperiDONE microspheres (RISPERDAL CONSTA) 37.5 MG injection Inject 37.5 mg into the muscle every 14 (fourteen) days.    Marland Kitchen gabapentin (NEURONTIN) 300 MG capsule Take 1 capsule (300 mg total) by mouth 3 (three) times daily. For agitation (Patient not taking: Reported on 11/29/2015) 90 capsule 0  . ibuprofen (ADVIL,MOTRIN) 800 MG tablet Take 1 tablet (800 mg total) by mouth 3 (three) times daily. Pain management (Patient not taking: Reported on 11/29/2015) 21 tablet 0  . OLANZapine zydis (ZYPREXA) 10 MG disintegrating  tablet Take 1 tablet (10 mg total) by mouth 2 (two) times daily. (Patient not taking: Reported on 11/29/2015)    . traZODone (DESYREL) 50 MG tablet Take 1 tablet (50 mg total) by mouth at bedtime as needed and may repeat dose one time if needed for sleep. (Patient not taking: Reported on 11/29/2015) 60 tablet 0  . [DISCONTINUED] benztropine (COGENTIN) 1 MG tablet Take 1 tablet (1 mg total) by mouth 2 (two) times daily. (Patient not taking: Reported on 11/05/2014) 60 tablet 0  . [DISCONTINUED] Oxcarbazepine (TRILEPTAL) 600 MG tablet Take 1 tablet (600 mg total) by mouth 2 (two) times daily. (Patient not taking: Reported on 11/05/2014) 60 tablet 0    Musculoskeletal: Strength & Muscle Tone: within normal limits Gait & Station: normal Patient leans: N/A  Psychiatric Specialty Exam: Review of Systems  Psychiatric/Behavioral: Positive for depression and substance abuse. Suicidal ideas: Passive. Hallucinations: States that he hears voices; but hasn't heard any since yesterday. The patient is nervous/anxious and has insomnia.   All other systems reviewed and are negative.   Blood pressure 100/55, pulse 72, temperature 98 F (36.7 C), temperature source Oral, resp. rate 16, height 6\' 1"  (1.854 m), weight 122.471 kg (270 lb), SpO2 98 %.Body mass index is 35.63 kg/(m^2).  General Appearance: Disheveled and Odorous  Eye Contact::  None  Speech:  Clear and Coherent and Normal Rate  Volume:  Normal  Mood:  Irritable  Affect:  Depressed  Thought Process:  Linear  Orientation:  Full (Time, Place, and Person)  Thought Content:  At this time patient denies hallucinations, delusions, and paranoia  Suicidal Thoughts:  Passive suicidal ideation with no plan  Homicidal Thoughts:  No  Memory:  Immediate;   Fair Recent;   Fair Remote;   Fair  Judgement:  Fair  Insight:  Lacking  Psychomotor Activity:  Decreased  Concentration:  Poor  Recall:  [place] of Knowledge:Fair  Language: Fair  Akathisia:  No   Handed:  Right  AIMS (if indicated):     Assets:  Desire for Improvement  ADL's:  Intact  Cognition: WNL  Sleep:      Treatment Plan Summary: Plan Contact [facility] ACT Team and Once ACT Team contacted patient can be discharged home.    Disposition: No evidence of imminent risk to self or others at present.   Patient does not meet criteria for psychiatric inpatient admission.  TTS with consult with ACT Team to come visit patient or set up an appointment for patient to follow up to make sure patient has medications; and follow up services. Once has spoken to ACT Team may discharge home.    Earleen Newport, NP 11/29/2015 5:40 PM  Patient seen face to face for this psych evaluation with Nurse practitioner and case discussed with treatment team and formulated treatment plan. Reviewed the information documented and agree with the treatment plan.  Syler Norcia,JANARDHAHA R. 12/02/2015 11:18 AM

## 2015-11-29 NOTE — ED Notes (Signed)
Pt. To [facility] from ED ambulatory without difficulty, to room 35 . Report from Dollar GeneralKelly RN. Pt. Is alert and oriented, warm and dry in no distress. Pt. States he has SI without a plan and has "some" HI toward people in general a well as "hearing things" and "seeing things".. Pt. Calm and cooperative. Pt. Made aware of security cameras and Q15 minute rounds. Pt. Encouraged to let Nursing staff know of any concerns or needs. Snack and beverage given.

## 2015-11-30 DIAGNOSIS — F1994 Other psychoactive substance use, unspecified with psychoactive substance-induced mood disorder: Secondary | ICD-10-CM | POA: Diagnosis not present

## 2015-11-30 DIAGNOSIS — F209 Schizophrenia, unspecified: Secondary | ICD-10-CM | POA: Diagnosis not present

## 2015-11-30 DIAGNOSIS — R45851 Suicidal ideations: Secondary | ICD-10-CM | POA: Diagnosis not present

## 2015-11-30 NOTE — ED Notes (Signed)
Pt. Noted sleeping in room. No complaints or concerns voiced. No distress or abnormal behavior noted. Will continue to monitor with security cameras. Q 15 minute rounds continue. 

## 2015-11-30 NOTE — ED Notes (Signed)
Report received from Lizbeth BarkJanie Rambo RN. Pt. Sleeping, respirations regular and unlabores. Will continue to monitor for safety via security cameras and Q 15 minute checks.

## 2015-11-30 NOTE — ED Notes (Signed)
Kelly from [facility] will be in to evaluate the pt

## 2015-11-30 NOTE — ED Notes (Signed)
Up to the bathroom 

## 2015-11-30 NOTE — Consult Note (Signed)
[facility] Face-to-Face Psychiatry Consult   Reason for Consult:  Suicidal ideation Referring Physician:  EDP Patient Identification: [name] MRN:  [id number] Principal Diagnosis: Substance induced mood disorder ([place]) Diagnosis:   Patient Active Problem List   Diagnosis Date Noted  . Substance induced mood disorder ([place]) [F19.94] 11/29/2015  . Homeless [Z59.0]   . Suicidal ideation [R45.851]   . Schizoaffective disorder, unspecified type ([place]) [F25.9]   . Schizoaffective disorder ([place]) [F25.9] 05/08/2013  . Cocaine abuse [F14.10] 11/11/2011    Total Time spent with patient: 30 minutes  Subjective:   Patient seems by this provider, case reviewed with social worker and nursing.  On evaluation:  [name] reports that he continues to be suicidal.  States that he continues to hear voices.  Patient agitated when asked questions about what the voices were saying.  Patient is due to get Risperdal Consta tomorrow. "If I had a gun I would shoot myself."  Patient does not have acces to a gun.  Homeless and has Educational psychologist ACT Team.    [phone]  Past Psychiatric History: Substance abuse; Schizoaffective Disorder; multiple inpatient admissions. Current outpatient services is with [facility].  Risk to Self: Suicidal Ideation: Yes-Currently Present Suicidal Intent: No Is patient at risk for suicide?: Yes Suicidal Plan?: No Access to Means: No What has been your use of drugs/alcohol within the last 12 months?: Pt denies any use; UDS is pending. How many times?: 1 Other Self Harm Risks: PT denies  Triggers for Past Attempts: Unpredictable Intentional Self Injurious Behavior: None Risk to Others: Homicidal Ideation: No Thoughts of Harm to Others: No Current Homicidal Intent: No Current Homicidal Plan: No Access to Homicidal Means: No Identified Victim: N/A History of harm to others?: No Assessment of Violence: None Noted Violent Behavior Description: No violent behaviors  observed. Pt is calm and cooperative at this time.  Does patient have access to weapons?: No Criminal Charges Pending?: No Does patient have a court date: No Prior Inpatient Therapy: Prior Inpatient Therapy: Yes Prior Therapy Dates: 2003, 2004, 2005, 2006, 2007, 2008, 2009, 2011, 2012, 2013, 2014 Prior Therapy Facilty/Provider(s): [facility] Reason for Treatment: Paranoid Schizophrenia, cocaine abuse  Prior Outpatient Therapy: Prior Outpatient Therapy: Yes Prior Therapy Dates: Current  Prior Therapy Facilty/Provider(s): [facility]  Reason for Treatment: Medication management  Does patient have an ACCT team?: Yes Does patient have Intensive In-House Services?  : No Does patient have [facility] services? : No Does patient have P4CC services?: No  Past Medical History:  Past Medical History  Diagnosis Date  . Schizophrenic disorder ([place])   . Boxer's fracture   . Hypertension    History reviewed. No pertinent past surgical history. Family History: History reviewed. No pertinent family history. Family Psychiatric  History: Would not answer Social History:  History  Alcohol Use No    Comment: seldom     History  Drug Use No    Social History   Social History  . Marital Status: Single    Spouse Name: N/A  . Number of Children: N/A  . Years of Education: N/A   Social History Main Topics  . Smoking status: Current Every Day Smoker -- 1.00 packs/day for 15 years    Types: Cigarettes  . Smokeless tobacco: Never Used  . Alcohol Use: No     Comment: seldom  . Drug Use: No  . Sexual Activity: No   Other Topics Concern  . None   Social History Narrative   ** Merged History  Encounter **       Additional Social History:    Allergies:   Allergies  Allergen Reactions  . Haldol [Haloperidol Decanoate] Other (See Comments)    'I curl up"    Labs:  Results for orders placed or performed during the hospital encounter of 11/29/15 (from the past 48 hour(s))  Comprehensive  metabolic panel     Status: Abnormal   Collection Time: 11/29/15  1:42 AM  Result Value Ref Range   Sodium 138 135 - 145 mmol/L   Potassium 3.4 (L) 3.5 - 5.1 mmol/L   Chloride 102 101 - 111 mmol/L   CO2 24 22 - 32 mmol/L   Glucose, Bld 147 (H) 65 - 99 mg/dL   BUN 9 6 - 20 mg/dL   Creatinine, Ser 0.93 0.61 - 1.24 mg/dL   Calcium 9.6 8.9 - 10.3 mg/dL   Total Protein 7.8 6.5 - 8.1 g/dL   Albumin 4.8 3.5 - 5.0 g/dL   AST 25 15 - 41 U/L   ALT 15 (L) 17 - 63 U/L   Alkaline Phosphatase 120 38 - 126 U/L   Total Bilirubin 1.1 0.3 - 1.2 mg/dL   GFR calc non Af Amer >60 >60 mL/min   GFR calc Af Amer >60 >60 mL/min    Comment: (NOTE) The eGFR has been calculated using the CKD EPI equation. This calculation has not been validated in all clinical situations. eGFR's persistently <60 mL/min signify possible Chronic Kidney Disease.    Anion gap 12 5 - 15  Ethanol (ETOH)     Status: None   Collection Time: 11/29/15  1:42 AM  Result Value Ref Range   Alcohol, Ethyl (B) <5 <5 mg/dL    Comment:        LOWEST DETECTABLE LIMIT FOR SERUM ALCOHOL IS 5 mg/dL FOR MEDICAL PURPOSES ONLY   Salicylate level     Status: None   Collection Time: 11/29/15  1:42 AM  Result Value Ref Range   Salicylate Lvl <3.2 2.8 - 30.0 mg/dL  Acetaminophen level     Status: Abnormal   Collection Time: 11/29/15  1:42 AM  Result Value Ref Range   Acetaminophen (Tylenol), Serum <10 (L) 10 - 30 ug/mL    Comment:        THERAPEUTIC CONCENTRATIONS VARY SIGNIFICANTLY. A RANGE OF 10-30 ug/mL MAY BE AN EFFECTIVE CONCENTRATION FOR MANY PATIENTS. HOWEVER, SOME ARE BEST TREATED AT CONCENTRATIONS OUTSIDE THIS RANGE. ACETAMINOPHEN CONCENTRATIONS >150 ug/mL AT 4 HOURS AFTER INGESTION AND >50 ug/mL AT 12 HOURS AFTER INGESTION ARE OFTEN ASSOCIATED WITH TOXIC REACTIONS.   CBC     Status: None   Collection Time: 11/29/15  1:42 AM  Result Value Ref Range   WBC 8.3 4.0 - 10.5 K/uL   RBC 4.92 4.22 - 5.81 MIL/uL   Hemoglobin  16.1 13.0 - 17.0 g/dL   HCT 46.0 39.0 - 52.0 %   MCV 93.5 78.0 - 100.0 fL   MCH 32.7 26.0 - 34.0 pg   MCHC 35.0 30.0 - 36.0 g/dL   RDW 13.2 11.5 - 15.5 %   Platelets 206 150 - 400 K/uL  Urine rapid drug screen (hosp performed) (Not at [facility])     Status: Abnormal   Collection Time: 11/29/15  5:11 PM  Result Value Ref Range   Opiates NONE DETECTED NONE DETECTED   Cocaine POSITIVE (A) NONE DETECTED   Benzodiazepines NONE DETECTED NONE DETECTED   Amphetamines NONE DETECTED NONE DETECTED   Tetrahydrocannabinol NONE DETECTED  NONE DETECTED   Barbiturates NONE DETECTED NONE DETECTED    Comment:        DRUG SCREEN FOR MEDICAL PURPOSES ONLY.  IF CONFIRMATION IS NEEDED FOR ANY PURPOSE, NOTIFY LAB WITHIN 5 DAYS.        LOWEST DETECTABLE LIMITS FOR URINE DRUG SCREEN Drug Class       Cutoff (ng/mL) Amphetamine      1000 Barbiturate      200 Benzodiazepine   295 Tricyclics       284 Opiates          300 Cocaine          300 THC              50     Current Facility-Administered Medications  Medication Dose Route Frequency Provider Last Rate Last Dose  . acetaminophen (TYLENOL) tablet 650 mg  650 mg Oral Q4H PRN Rolland Porter, MD      . alum & mag hydroxide-simeth (MAALOX/MYLANTA) 200-200-20 MG/5ML suspension 30 mL  30 mL Oral PRN Rolland Porter, MD      . gabapentin (NEURONTIN) capsule 300 mg  300 mg Oral TID Rolland Porter, MD   300 mg at 11/30/15 0919  . ibuprofen (ADVIL,MOTRIN) tablet 600 mg  600 mg Oral Q8H PRN Rolland Porter, MD      . nicotine (NICODERM CQ - dosed in mg/24 hours) patch 21 mg  21 mg Transdermal Daily Rolland Porter, MD   21 mg at 11/29/15 1051  . OLANZapine zydis (ZYPREXA) disintegrating tablet 10 mg  10 mg Oral BID Rolland Porter, MD   10 mg at 11/29/15 2115  . ondansetron (ZOFRAN) tablet 4 mg  4 mg Oral Q8H PRN Rolland Porter, MD      . traZODone (DESYREL) tablet 50 mg  50 mg Oral QHS PRN,MR X 1 Rolland Porter, MD       Current Outpatient Prescriptions  Medication Sig Dispense Refill  . OLANZapine  zydis (ZYPREXA) 20 MG disintegrating tablet Take 20 mg by mouth at bedtime.    . risperiDONE microspheres (RISPERDAL CONSTA) 37.5 MG injection Inject 37.5 mg into the muscle every 14 (fourteen) days.    Marland Kitchen gabapentin (NEURONTIN) 300 MG capsule Take 1 capsule (300 mg total) by mouth 3 (three) times daily. For agitation (Patient not taking: Reported on 11/29/2015) 90 capsule 0  . ibuprofen (ADVIL,MOTRIN) 800 MG tablet Take 1 tablet (800 mg total) by mouth 3 (three) times daily. Pain management (Patient not taking: Reported on 11/29/2015) 21 tablet 0  . OLANZapine zydis (ZYPREXA) 10 MG disintegrating tablet Take 1 tablet (10 mg total) by mouth 2 (two) times daily. (Patient not taking: Reported on 11/29/2015)    . traZODone (DESYREL) 50 MG tablet Take 1 tablet (50 mg total) by mouth at bedtime as needed and may repeat dose one time if needed for sleep. (Patient not taking: Reported on 11/29/2015) 60 tablet 0  . [DISCONTINUED] benztropine (COGENTIN) 1 MG tablet Take 1 tablet (1 mg total) by mouth 2 (two) times daily. (Patient not taking: Reported on 11/05/2014) 60 tablet 0  . [DISCONTINUED] Oxcarbazepine (TRILEPTAL) 600 MG tablet Take 1 tablet (600 mg total) by mouth 2 (two) times daily. (Patient not taking: Reported on 11/05/2014) 60 tablet 0    Musculoskeletal: Strength & Muscle Tone: within normal limits Gait & Station: normal Patient leans: N/A  Psychiatric Specialty Exam: Review of Systems  Psychiatric/Behavioral: Positive for depression and substance abuse. Suicidal ideas: Passive. Hallucinations: States that he hears voices;  but hasn't heard any since yesterday. The patient is nervous/anxious and has insomnia.   All other systems reviewed and are negative.   Blood pressure 101/61, pulse 88, temperature 98.3 F (36.8 C), temperature source Oral, resp. rate 16, height _0  (1.854 m), weight 122.471 kg (270 lb), SpO2 98 %.Body mass index is 35.63 kg/(m^2).  General Appearance: Disheveled and Odorous  Eye  Contact::  None  Speech:  Clear and Coherent and Normal Rate  Volume:  Normal  Mood:  Depressed and Irritable  Affect:  Constricted and Depressed  Thought Process:  Linear  Orientation:  Full (Time, Place, and Person)  Thought Content:  At this time patient denies hallucinations, delusions, and paranoia  Suicidal Thoughts:  Passive suicidal ideation with no plan  Homicidal Thoughts:  No  Memory:  Immediate;   Fair Recent;   Fair Remote;   Fair  Judgement:  Fair  Insight:  Lacking and Shallow  Psychomotor Activity:  Decreased  Concentration:  Poor  Recall:  [employer] of Knowledge:Fair  Language: Fair  Akathisia:  No  Handed:  Right  AIMS (if indicated):     Assets:  Desire for Improvement  ADL's:  Intact  Cognition: WNL  Sleep:      Treatment Plan Summary: Plan TTS/Social Worker to Conservation officer, historic buildings Interventions ACT Team.  Give Risperdal Consta injection and  After speaking with ACT Team possible discharge home.  Disposition: No evidence of imminent risk to self or others at present.   Patient does not meet criteria for psychiatric inpatient admission. TTS/Social Worker to speak with [facility] Inter[facility] ACT Team member; schedule follow up appointment or have come by hospital;  Give Risperdal Consta injection.  Reassess for discharge .    RankinDelphia Grates, NP 11/30/2015 3:09 PM  Patient seen face to face for this psych evaluation with Nurse practitioner and case discussed with treatment team and formulated treatment plan. Reviewed the information documented and agree with the treatment plan.  Nevaen Tredway,JANARDHAHA R. 12/02/2015 11:23 AM

## 2015-11-30 NOTE — ED Notes (Signed)
Kelly at [facility] to eval pt in the morning per NP

## 2015-11-30 NOTE — ED Notes (Signed)
Dr j and shovon np into see

## 2015-11-30 NOTE — Progress Notes (Signed)
Counselor telephoned [facility] ACTT team crisis line [phone]) to receive collateral information about patient and request assistance in regard to developing disposition plan. Counselor left voicemail requesting a return phone call.   Carl ColonelGregory Pickett Jr. MSW, LCSW Therapeutic Triage Services-Triage Specialist   Phone: [phone]

## 2015-11-30 NOTE — ED Notes (Signed)
Pt. Noted in room. No complaints or concerns voiced. No distress or abnormal behavior noted. Will continue to monitor with security cameras. Q 15 minute rounds continue. Snack and beverage given. 

## 2015-11-30 NOTE — ED Notes (Signed)
Pt. Noted sleepng in room. No complaints or concerns voiced. No distress or abnormal behavior noted. Will continue to monitor with security cameras. Q 15 minute rounds continue.

## 2015-11-30 NOTE — BH Assessment (Signed)
Contacted patients ACT Team at [phone] per Assunta FoundShuvon Rankin, NP request stating that she would like for someone from the ACTT to assess the patient. Spoke with Joe who took demographic information from hospital and he states that he will contact someone and have them "follow-up as soon as they are available."  Davina PokeJoVea Holly Pring, LCSW Therapeutic Triage Specialist [facility] 11/30/2015 3:11 PM

## 2015-12-01 DIAGNOSIS — F209 Schizophrenia, unspecified: Secondary | ICD-10-CM | POA: Diagnosis not present

## 2015-12-01 MED ORDER — TERBINAFINE HCL 1 % EX CREA
TOPICAL_CREAM | Freq: Two times a day (BID) | CUTANEOUS | Status: DC
  Administered 2015-12-01 – 2015-12-02 (×2): via TOPICAL
  Filled 2015-12-01: qty 12

## 2015-12-01 NOTE — ED Provider Notes (Addendum)
C/io pruritic rash at scrotum for 2 years intermttently. pt in NAD scrotum mildly reddened c/w tinea cruris. Pharmacy called to recommend topical treatment  Doug SouSam Eyleen Rawlinson, MD 12/01/15 2132  Doug SouSam Analyce Tavares, MD 12/01/15 2133

## 2015-12-01 NOTE — Progress Notes (Signed)
[name] at [facility] states that the patient can come 12/02/2015 after 9am.

## 2015-12-01 NOTE — ED Notes (Signed)
Patients ACT team came and assessed pt.  States pt. Is more agitated than usual.  She states he is definitely not at base line.  Pt however states that he wants to be discharged.  He does not like working with that ACT team.  Pt denies SI, HI, and AVH.[name].  15 minute checks and video monitoring in place.

## 2015-12-01 NOTE — ED Notes (Signed)
Pt was accepted to [facility] and doesn't want to go.  Catha NottinghamJamison NP made aware and asked to let night time NP lay eyes on patient..  Pt denies SI, HI, and AVH.

## 2015-12-01 NOTE — ED Notes (Signed)
Pt. Noted sleeping in room. No complaints or concerns voiced. No distress or abnormal behavior noted. Will continue to monitor with security cameras. Q 15 minute rounds continue. 

## 2015-12-01 NOTE — ED Notes (Signed)
Patient cooperative. Denies SI, HI, AVH. States that he is feeling better and would like to go home. Patient reports that he does not like his ACT team. He reports that he did not endorse SI to them but states they reported this to staff here.   Encouragement offered.  Q 15 safety checks continue

## 2015-12-01 NOTE — ED Notes (Signed)
EDP Jacubowitz aware of patient complaint of pain/irritation to groin.

## 2015-12-01 NOTE — BH Assessment (Signed)
[facility] Assessment Progress Note  Per Mojeed Akintayo, MD, this pt requires psychiatric hospitalization.  Pt has been referred to [facility], [facility], and [facility].  Response from all three is pending.  Ayza Ripoll, MA Triage Specialist [phone]     

## 2015-12-01 NOTE — BH Assessment (Signed)
[facility] Assessment Progress Note Per Isha, [facility]. Call report to [phone], accepting Md Dr. Les Pouarlton, pt can come any time.

## 2015-12-01 NOTE — Consult Note (Signed)
[facility] Face-to-Face Psychiatry Consult   Reason for Consult:  Suicidal ideation Referring Physician:  EDP Patient Identification: [name] MRN:  [id number] Principal Diagnosis: Substance induced mood disorder (HCC) Diagnosis:   Patient Active Problem List   Diagnosis Date Noted  . Substance induced mood disorder (HCC) [F19.94] 11/29/2015  . Homeless [Z59.0]   . Suicidal ideation [R45.851]   . Schizoaffective disorder, unspecified type (HCC) [F25.9]   . Schizoaffective disorder (HCC) [F25.9] 05/08/2013  . Cocaine abuse [F14.10] 11/11/2011    Total Time spent with patient: 30 minutes  Subjective:   Patient seen by this provider, case reviewed with social worker and nursing.  On evaluation:  [name] reports that he continues to be passively suicidal.  States that he has continued to hear voices at times.  Patient agitated when asked questions about what the voices were saying.  Patient is due to get Risperdal Consta tomorrow. "If I had a gun I would shoot myself."  Patient does not have acces to a gun.  Homeless and has Geographical information systems officer ACT Team.    [phone]  Objective:   [name] is a 40 year old male with a long history of schizoaffective disorder who presented to [facility] with acute psychotic symptoms along with suicidal ideation. Patient has a history of multiple past psychiatric admissions. His ACT team was called to come evaluate him to determine if he is functioning at his baseline. According to their assessment the patient is much more agitated than his baseline. Patient has been showing poor insight into his symptoms as he is requesting discharge. [name] continues to shows signs of an acute decompensation and will require an inpatient admission to stabilize him.   Past Psychiatric History: Substance abuse; Schizoaffective Disorder; multiple inpatient admissions. [facility] outpatient services is with [facility].  Risk to Self: Suicidal Ideation: Yes-Currently  Present Suicidal Intent: No Is patient at risk for suicide?: Yes Suicidal Plan?: No Access to Means: No What has been your use of drugs/alcohol within the last 12 months?: Pt denies any use; UDS is pending. How many times?: 1 Other Self Harm Risks: PT denies  Triggers for Past Attempts: Unpredictable Intentional Self Injurious Behavior: None Risk to Others: Homicidal Ideation: No Thoughts of Harm to Others: No Current Homicidal Intent: No Current Homicidal Plan: No Access to Homicidal Means: No Identified Victim: N/A History of harm to others?: No Assessment of Violence: None Noted Violent Behavior Description: No violent behaviors observed. Pt is calm and cooperative at this time.  Does patient have access to weapons?: No Criminal Charges Pending?: No Does patient have a court date: No Prior Inpatient Therapy: Prior Inpatient Therapy: Yes Prior Therapy Dates: 2003, 2004, 2005, 2006, 2007, 2008, 2009, 2011, 2012, 2013, 2014 Prior Therapy Facilty/Provider(s): [facility] Reason for Treatment: Paranoid Schizophrenia, cocaine abuse  Prior Outpatient Therapy: Prior Outpatient Therapy: Yes Prior Therapy Dates: Current  [facility]): [facility]  Reason for Treatment: Medication management  Does patient have an ACCT team?: Yes Does patient have Intensive In-House Services?  : No Does patient have [facility] services? : No Does patient have P4CC services?: No  Past Medical History:  Past Medical History  Diagnosis Date  . Schizophrenic disorder (HCC)   . Boxer's fracture   . Hypertension    History reviewed. No pertinent past surgical history. Family History: History reviewed. No pertinent family history. Family Psychiatric  History: Would not answer Social History:  History  Alcohol Use No    Comment: seldom  History  Drug Use No    Social History   Social History  . Marital Status: Single    Spouse Name: N/A  . Number of Children: N/A  . Years of  Education: N/A   Social History Main Topics  . Smoking status: Current Every Day Smoker -- 1.00 packs/day for 15 years    Types: Cigarettes  . Smokeless tobacco: Never Used  . Alcohol Use: No     Comment: seldom  . Drug Use: No  . Sexual Activity: No   Other Topics Concern  . None   Social History Narrative   ** Merged History Encounter **       Additional Social History:    Allergies:   Allergies  Allergen Reactions  . Haldol [Haloperidol Decanoate] Other (See Comments)    'I curl up"    Labs:  Results for orders placed or performed during the hospital encounter of 11/29/15 (from the past 48 hour(s))  Urine rapid drug screen (hosp performed) (Not at [facility])     Status: Abnormal   Collection Time: 11/29/15  5:11 PM  Result Value Ref Range   Opiates NONE DETECTED NONE DETECTED   Cocaine POSITIVE (A) NONE DETECTED   Benzodiazepines NONE DETECTED NONE DETECTED   Amphetamines NONE DETECTED NONE DETECTED   Tetrahydrocannabinol NONE DETECTED NONE DETECTED   Barbiturates NONE DETECTED NONE DETECTED    Comment:        DRUG SCREEN FOR MEDICAL PURPOSES ONLY.  IF CONFIRMATION IS NEEDED FOR ANY PURPOSE, NOTIFY LAB WITHIN 5 DAYS.        LOWEST DETECTABLE LIMITS FOR URINE DRUG SCREEN Drug Class       Cutoff (ng/mL) Amphetamine      1000 Barbiturate      200 Benzodiazepine   200 Tricyclics       300 Opiates          300 Cocaine          300 THC              50     Current Facility-Administered Medications  Medication Dose Route Frequency Provider Last Rate Last Dose  . acetaminophen (TYLENOL) tablet 650 mg  650 mg Oral Q4H PRN Devoria Albe, MD      . alum & mag hydroxide-simeth (MAALOX/MYLANTA) 200-200-20 MG/5ML suspension 30 mL  30 mL Oral PRN Devoria Albe, MD      . gabapentin (NEURONTIN) capsule 300 mg  300 mg Oral TID Devoria Albe, MD   300 mg at 12/01/15 1048  . ibuprofen (ADVIL,MOTRIN) tablet 600 mg  600 mg Oral Q8H PRN Devoria Albe, MD      . nicotine (NICODERM CQ - dosed in  mg/24 hours) patch 21 mg  21 mg Transdermal Daily Devoria Albe, MD   21 mg at 11/29/15 1051  . OLANZapine zydis (ZYPREXA) disintegrating tablet 10 mg  10 mg Oral BID Devoria Albe, MD   10 mg at 11/30/15 2108  . ondansetron (ZOFRAN) tablet 4 mg  4 mg Oral Q8H PRN Devoria Albe, MD      . traZODone (DESYREL) tablet 50 mg  50 mg Oral QHS PRN,MR X 1 Devoria Albe, MD       Current Outpatient Prescriptions  Medication Sig Dispense Refill  . OLANZapine zydis (ZYPREXA) 20 MG disintegrating tablet Take 20 mg by mouth at bedtime.    . risperiDONE microspheres (RISPERDAL CONSTA) 37.5 MG injection Inject 37.5 mg into the muscle every 14 (fourteen) days.    Marland Kitchen  gabapentin (NEURONTIN) 300 MG capsule Take 1 capsule (300 mg total) by mouth 3 (three) times daily. For agitation (Patient not taking: Reported on 11/29/2015) 90 capsule 0  . ibuprofen (ADVIL,MOTRIN) 800 MG tablet Take 1 tablet (800 mg total) by mouth 3 (three) times daily. Pain management (Patient not taking: Reported on 11/29/2015) 21 tablet 0  . OLANZapine zydis (ZYPREXA) 10 MG disintegrating tablet Take 1 tablet (10 mg total) by mouth 2 (two) times daily. (Patient not taking: Reported on 11/29/2015)    . traZODone (DESYREL) 50 MG tablet Take 1 tablet (50 mg total) by mouth at bedtime as needed and may repeat dose one time if needed for sleep. (Patient not taking: Reported on 11/29/2015) 60 tablet 0  . [DISCONTINUED] benztropine (COGENTIN) 1 MG tablet Take 1 tablet (1 mg total) by mouth 2 (two) times daily. (Patient not taking: Reported on 11/05/2014) 60 tablet 0  . [DISCONTINUED] Oxcarbazepine (TRILEPTAL) 600 MG tablet Take 1 tablet (600 mg total) by mouth 2 (two) times daily. (Patient not taking: Reported on 11/05/2014) 60 tablet 0    Musculoskeletal: Strength & Muscle Tone: within normal limits Gait & Station: normal Patient leans: N/A  Psychiatric Specialty Exam: Review of Systems  Psychiatric/Behavioral: Positive for depression and substance abuse (Urine positive for  cocaine ). Suicidal ideas: Passive. Hallucinations: States that he hears voices; but hasn't heard any since yesterday. The patient is nervous/anxious and has insomnia.   All other systems reviewed and are negative.   Blood pressure 90/59, pulse 73, temperature 98.2 F (36.8 C), temperature source Oral, resp. rate 16, height 6\' 1"  (1.854 m), weight 122.471 kg (270 lb), SpO2 99 %.Body mass index is 35.63 kg/(m^2).  General Appearance: Disheveled and Odorous  Eye Contact::  None  Speech:  Clear and Coherent and Normal Rate  Volume:  Normal  Mood:  Depressed and Irritable  Affect:  Constricted and Depressed  Thought Process:  Linear  Orientation:  Full (Time, Place, and Person)  Thought Content:  At this time patient denies hallucinations, delusions, and paranoia  Suicidal Thoughts:  Passive suicidal ideation with no plan  Homicidal Thoughts:  No  Memory:  Immediate;   Fair Recent;   Fair Remote;   Fair  Judgement:  Fair  Insight:  Lacking and Shallow  Psychomotor Activity:  Decreased  Concentration:  Poor  Recall:  FiservFair  Fund of Knowledge:Fair  Language: Fair  Akathisia:  No  Handed:  Right  AIMS (if indicated):     Assets:  Desire for Improvement Resilience  ADL's:  Intact  Cognition: WNL  Sleep:      Treatment Plan Summary: Plan TTS/Social Worker to Manufacturing engineercontact [facility].  Due to patient not being found to be at his baseline will recommend inpatient admission for stabilization.   Disposition: Recommend psychiatric Inpatient admission when medically cleared. Supportive therapy provided about ongoing stressors. Discussed crisis plan, support from social network, calling 911, coming to the Emergency Department, and calling Suicide Hotline. Marland Kitchen.   Fransisca KaufmannAVIS, LAURA, NP 12/01/2015 4:00 PM Patient seen face-to-face for psychiatric evaluation, chart reviewed and case discussed with the physician extender and developed treatment plan. Reviewed the information documented  and agree with the treatment plan. Thedore MinsMojeed Lanai Conlee, MD

## 2015-12-02 DIAGNOSIS — F209 Schizophrenia, unspecified: Secondary | ICD-10-CM | POA: Diagnosis not present

## 2015-12-02 NOTE — BH Assessment (Signed)
[facility] Assessment Progress Note  This Clinical research associatewriter spoke to pt about transfer to [facility].  He reluctantly agrees to this.  I then spoke to pt's nurse, Kendal HymenEdie, to notify her.  [facility] is to be [facility].  Jenesis Martin, MA Triage Specialist [phone]

## 2015-12-02 NOTE — ED Notes (Signed)
Pt discharged reluctantly with [facility] driver.  Pt was mumbling under his voice that he was going for no reason.  All belongings were returned to patient.

## 2015-12-03 DIAGNOSIS — F209 Schizophrenia, unspecified: Secondary | ICD-10-CM | POA: Diagnosis not present

## 2015-12-04 DIAGNOSIS — F209 Schizophrenia, unspecified: Secondary | ICD-10-CM | POA: Diagnosis not present

## 2015-12-05 DIAGNOSIS — F209 Schizophrenia, unspecified: Secondary | ICD-10-CM | POA: Diagnosis not present

## 2015-12-06 DIAGNOSIS — F209 Schizophrenia, unspecified: Secondary | ICD-10-CM | POA: Diagnosis not present

## 2015-12-07 DIAGNOSIS — F209 Schizophrenia, unspecified: Secondary | ICD-10-CM | POA: Diagnosis not present

## 2015-12-08 DIAGNOSIS — F209 Schizophrenia, unspecified: Secondary | ICD-10-CM | POA: Diagnosis not present

## 2015-12-30 ENCOUNTER — Emergency Department (HOSPITAL_COMMUNITY)
Admission: EM | Admit: 2015-12-30 | Discharge: 2015-12-31 | Payer: Medicare Other | Attending: Emergency Medicine | Admitting: Emergency Medicine

## 2015-12-30 ENCOUNTER — Encounter (HOSPITAL_COMMUNITY): Payer: Self-pay

## 2015-12-30 DIAGNOSIS — F250 Schizoaffective disorder, bipolar type: Secondary | ICD-10-CM | POA: Diagnosis not present

## 2015-12-30 DIAGNOSIS — R062 Wheezing: Secondary | ICD-10-CM | POA: Diagnosis not present

## 2015-12-30 DIAGNOSIS — F251 Schizoaffective disorder, depressive type: Secondary | ICD-10-CM | POA: Diagnosis present

## 2015-12-30 DIAGNOSIS — Z8781 Personal history of (healed) traumatic fracture: Secondary | ICD-10-CM | POA: Diagnosis not present

## 2015-12-30 DIAGNOSIS — I10 Essential (primary) hypertension: Secondary | ICD-10-CM | POA: Diagnosis not present

## 2015-12-30 DIAGNOSIS — F259 Schizoaffective disorder, unspecified: Secondary | ICD-10-CM

## 2015-12-30 DIAGNOSIS — F1994 Other psychoactive substance use, unspecified with psychoactive substance-induced mood disorder: Secondary | ICD-10-CM | POA: Diagnosis present

## 2015-12-30 DIAGNOSIS — F1410 Cocaine abuse, uncomplicated: Secondary | ICD-10-CM | POA: Diagnosis not present

## 2015-12-30 DIAGNOSIS — Z79899 Other long term (current) drug therapy: Secondary | ICD-10-CM | POA: Insufficient documentation

## 2015-12-30 DIAGNOSIS — F17210 Nicotine dependence, cigarettes, uncomplicated: Secondary | ICD-10-CM | POA: Insufficient documentation

## 2015-12-30 DIAGNOSIS — R45851 Suicidal ideations: Secondary | ICD-10-CM

## 2015-12-30 LAB — CBC WITH DIFFERENTIAL/PLATELET
Basophils Absolute: 0 10*3/uL (ref 0.0–0.1)
Basophils Relative: 0 %
EOS ABS: 0.3 10*3/uL (ref 0.0–0.7)
EOS PCT: 3 %
HCT: 44.8 % (ref 39.0–52.0)
HEMOGLOBIN: 15.8 g/dL (ref 13.0–17.0)
LYMPHS ABS: 2.8 10*3/uL (ref 0.7–4.0)
Lymphocytes Relative: 29 %
MCH: 32.5 pg (ref 26.0–34.0)
MCHC: 35.3 g/dL (ref 30.0–36.0)
MCV: 92.2 fL (ref 78.0–100.0)
MONO ABS: 0.6 10*3/uL (ref 0.1–1.0)
MONOS PCT: 6 %
Neutro Abs: 6 10*3/uL (ref 1.7–7.7)
Neutrophils Relative %: 62 %
PLATELETS: 206 10*3/uL (ref 150–400)
RBC: 4.86 MIL/uL (ref 4.22–5.81)
RDW: 13.4 % (ref 11.5–15.5)
WBC: 9.7 10*3/uL (ref 4.0–10.5)

## 2015-12-30 LAB — COMPREHENSIVE METABOLIC PANEL
ALK PHOS: 113 U/L (ref 38–126)
ALT: 15 U/L — ABNORMAL LOW (ref 17–63)
ANION GAP: 12 (ref 5–15)
AST: 19 U/L (ref 15–41)
Albumin: 4.7 g/dL (ref 3.5–5.0)
BUN: 7 mg/dL (ref 6–20)
CALCIUM: 9.5 mg/dL (ref 8.9–10.3)
CO2: 26 mmol/L (ref 22–32)
Chloride: 102 mmol/L (ref 101–111)
Creatinine, Ser: 0.97 mg/dL (ref 0.61–1.24)
GFR calc non Af Amer: >60 mL/min (ref >60)
Glucose, Bld: 97 mg/dL (ref 65–99)
POTASSIUM: 3.4 mmol/L — AB (ref 3.5–5.1)
SODIUM: 140 mmol/L (ref 135–145)
TOTAL PROTEIN: 7.8 g/dL (ref 6.5–8.1)
Total Bilirubin: 1 mg/dL (ref 0.3–1.2)

## 2015-12-30 LAB — ETHANOL: Alcohol, Ethyl (B): <5 mg/dL (ref <5)

## 2015-12-30 LAB — RAPID URINE DRUG SCREEN, HOSP PERFORMED
AMPHETAMINES: NOT DETECTED
Barbiturates: NOT DETECTED
Benzodiazepines: NOT DETECTED
Cocaine: POSITIVE — AB
OPIATES: NOT DETECTED
TETRAHYDROCANNABINOL: NOT DETECTED

## 2015-12-30 LAB — ACETAMINOPHEN LEVEL: Acetaminophen (Tylenol), Serum: <10 ug/mL — ABNORMAL LOW (ref 10–30)

## 2015-12-30 LAB — SALICYLATE LEVEL

## 2015-12-30 MED ORDER — OLANZAPINE 10 MG PO TBDP
10.0000 mg | ORAL_TABLET | Freq: Two times a day (BID) | ORAL | Status: DC
  Administered 2015-12-30 – 2015-12-31 (×3): 10 mg via ORAL
  Filled 2015-12-30 (×3): qty 1

## 2015-12-30 MED ORDER — ALUM & MAG HYDROXIDE-SIMETH 200-200-20 MG/5ML PO SUSP: 30.0000 mL | ORAL | Status: DC | PRN

## 2015-12-30 MED ORDER — NICOTINE 21 MG/24HR TD PT24
21.0000 mg | MEDICATED_PATCH | Freq: Every day | TRANSDERMAL | Status: DC
Start: 2015-12-30 — End: 2015-12-31
  Administered 2015-12-30: 21 mg via TRANSDERMAL
  Filled 2015-12-30: qty 1

## 2015-12-30 MED ORDER — IBUPROFEN 200 MG PO TABS: 600.0000 mg | ORAL_TABLET | Freq: Three times a day (TID) | ORAL | Status: DC | PRN

## 2015-12-30 MED ORDER — ALBUTEROL SULFATE HFA 108 (90 BASE) MCG/ACT IN AERS
2.0000 | INHALATION_SPRAY | Freq: Once | RESPIRATORY_TRACT | Status: AC
  Administered 2015-12-30: 2 via RESPIRATORY_TRACT
  Filled 2015-12-30: qty 6.7

## 2015-12-30 MED ORDER — GABAPENTIN 300 MG PO CAPS
300.0000 mg | ORAL_CAPSULE | Freq: Three times a day (TID) | ORAL | Status: DC
  Administered 2015-12-30 – 2015-12-31 (×4): 300 mg via ORAL
  Filled 2015-12-30 (×4): qty 1

## 2015-12-30 MED ORDER — ACETAMINOPHEN 325 MG PO TABS: 650.0000 mg | ORAL_TABLET | ORAL | Status: DC | PRN

## 2015-12-30 MED ORDER — ONDANSETRON HCL 4 MG PO TABS: 4.0000 mg | ORAL_TABLET | Freq: Three times a day (TID) | ORAL | Status: DC | PRN

## 2015-12-30 MED ORDER — HYDROXYZINE HCL 25 MG PO TABS: 25.0000 mg | ORAL_TABLET | Freq: Three times a day (TID) | ORAL | Status: DC | PRN

## 2015-12-30 MED ORDER — LORAZEPAM 1 MG PO TABS
1.0000 mg | ORAL_TABLET | Freq: Three times a day (TID) | ORAL | Status: DC | PRN
Start: 2015-12-30 — End: 2015-12-30

## 2015-12-30 MED ORDER — TRAZODONE HCL 50 MG PO TABS: 50.0000 mg | ORAL_TABLET | Freq: Every evening | ORAL | Status: DC | PRN

## 2015-12-30 NOTE — ED Provider Notes (Signed)
CSN: [id number]     Arrival date & time 12/30/15  4098 History   First MD Initiated Contact with Patient 12/30/15 [phone]     Chief Complaint  Patient presents with  . Suicidal     (Consider location/radiation/quality/duration/timing/severity/associated sxs/prior Treatment) The history is provided by the patient and medical records.    Pt with hx schizophrenic disorder, SI, p/w SI.  States "I don't like the way life has treated me."  Denies having a plan but states "when it happens, it happens."  States he is hearing voices and seeing things and he does not feel that it's right. States he was talking to a man earlier who was just trying to talk to him but he thought the person had a gun to his head.  He states he is also feeling like hurting some other people in his life who have made him angry or who have tried to hurt him.  States he is not taking any of his medications.    Notes he is supposed to have an inhaler.  Denies fever, cough, chest pain, or any other concerning symptoms.    Past Medical History  Diagnosis Date  . Schizophrenic disorder (HCC)   . Boxer's fracture   . Hypertension    History reviewed. No pertinent past surgical history. History reviewed. No pertinent family history. Social History  Substance Use Topics  . Smoking status: Current Every Day Smoker -- 1.00 packs/day for 15 years    Types: Cigarettes  . Smokeless tobacco: Never Used  . Alcohol Use: No     Comment: seldom    Review of Systems  All other systems reviewed and are negative.     Allergies  Haldol  Home Medications   Prior to Admission medications   Medication Sig Start Date End Date Taking? Authorizing Provider  OLANZapine zydis (ZYPREXA) 20 MG disintegrating tablet Take 20 mg by mouth at bedtime.   Yes Historical Provider, MD  risperiDONE microspheres (RISPERDAL CONSTA) 37.5 MG injection Inject 37.5 mg into the muscle every 14 (fourteen) days.   Yes Historical Provider, MD  gabapentin  (NEURONTIN) 300 MG capsule Take 1 capsule (300 mg total) by mouth 3 (three) times daily. For agitation Patient not taking: Reported on 11/29/2015 05/23/15   Sanjuana Kava, NP  ibuprofen (ADVIL,MOTRIN) 800 MG tablet Take 1 tablet (800 mg total) by mouth 3 (three) times daily. Pain management Patient not taking: Reported on 11/29/2015 08/04/15   Thermon Leyland, NP  OLANZapine zydis (ZYPREXA) 10 MG disintegrating tablet Take 1 tablet (10 mg total) by mouth 2 (two) times daily. Patient not taking: Reported on 11/29/2015 08/04/15   Thermon Leyland, NP  traZODone (DESYREL) 50 MG tablet Take 1 tablet (50 mg total) by mouth at bedtime as needed and may repeat dose one time if needed for sleep. Patient not taking: Reported on 11/29/2015 05/23/15   Sanjuana Kava, NP   BP 150/105 mmHg  Pulse 106  Temp(Src) 97.6 F (36.4 C) (Oral)  Resp 18  SpO2 98% Physical Exam  Constitutional: He appears well-developed and well-nourished. No distress.  HENT:  Head: Normocephalic and atraumatic.  Neck: Neck supple.  Cardiovascular: Normal rate and regular rhythm.   Pulmonary/Chest: Effort normal and breath sounds normal. No respiratory distress. He has no rales.  Occasional wheeze   Abdominal: Soft. He exhibits no distension and no mass. There is no tenderness. There is no rebound and no guarding.  Neurological: He is alert. He exhibits normal muscle  tone.  Skin: He is not diaphoretic.  Nursing note and vitals reviewed.   ED Course  Procedures (including critical care time) Labs Review Labs Reviewed  URINE RAPID DRUG SCREEN, HOSP PERFORMED - Abnormal; Notable for the following:    Cocaine POSITIVE (*)    All other components within normal limits  COMPREHENSIVE METABOLIC PANEL - Abnormal; Notable for the following:    Potassium 3.4 (*)    ALT 15 (*)    All other components within normal limits  ACETAMINOPHEN LEVEL - Abnormal; Notable for the following:    Acetaminophen (Tylenol), Serum <10 (*)    All other components  within normal limits  ETHANOL  CBC WITH DIFFERENTIAL/PLATELET  SALICYLATE LEVEL    Imaging Review No results found. I have personally reviewed and evaluated these images and lab results as part of my medical decision-making.   EKG Interpretation None      MDM   Final diagnoses:  Suicidal ideation  Schizoaffective disorder, bipolar type (HCC)   Pt with psychiatric history, schizophrenic disorder, p/w SI.  Medically stable.  Anticipate inpatient psychiatric care.  Pt is voluntary.     Trixie Dredgemily Aivan Fillingim, PA-C 12/30/15 1518  Lorre NickAnthony Allen, MD 01/01/16 [phone]

## 2015-12-30 NOTE — ED Notes (Signed)
Patient has been in bed sleeping all afternoon.  States he is really tired and feels "like crap."

## 2015-12-30 NOTE — BH Assessment (Addendum)
Assessment Note  [name] is an 40 y.o. male. He has a history of Schizophrenia.  Patient is a male presenting to [facility] reporting passive suicidal ideations. Pt stated "I don't want to be living". "I don't want to be here". Pt is poor historian and doesn't answer most questions asked. Pt reported suicidal ideations but denied having a plan. Pt reported that he has attempted suicide in the past (multiple x's). He couldn't recall previous triggers or or details related to previous suicidal attempts. Previous ED notes indicated that patient has tried to cut his wrist. Patient doesn't know if he is currently receiving mental health treatment. Sts, "I can't remember lady". Per previous ED notes he is currently receiving mental health treatment through [facility] (ACT team) and shared that he has had multiple inpatient admissions. Patient does not recall past facilities in which he was was admitted. Pt is reporting multiple depressive symptoms and shared that he is dealing with some stressors. Patient refused to disclose his stressors. Pt reported that he is not sleeping well and reported that his appetite is poor. Pt stated "I eat 1-2 meals per week".  Pt denies HI at this time. Pt is reporting auditory and visual hallucinations and reported that he is hearing sounds that he can't make out or determine. Pt did not report any alcohol or illicit substance abuse. Pt's UDS is pending. Pt did not report any physical, sexual or emotional abuse at this time.    Diagnosis: Schizophrenia and  Depressive Disorder  Past Medical History:  Past Medical History  Diagnosis Date  . Schizophrenic disorder (HCC)   . Boxer's fracture   . Hypertension     History reviewed. No pertinent past surgical history.  Family History: History reviewed. No pertinent family history.  Social History:  reports that he has been smoking Cigarettes.  He has a 15 pack-year smoking history. He has never used smokeless tobacco. He reports that  he does not drink alcohol or use illicit drugs.  Additional Social History:  Alcohol / Drug Use Pain Medications: patient denies  Prescriptions: patient denies  Over the Counter: patient denies  History of alcohol / drug use?: No history of alcohol / drug abuse  CIWA: CIWA-Ar BP: (!) 150/105 mmHg Pulse Rate: 106 COWS:    Allergies:  Allergies  Allergen Reactions  . Haldol [Haloperidol Decanoate] Other (See Comments)    'I curl up"    Home Medications:  (Not in a hospital admission)  OB/GYN Status:  No LMP for male patient.  General Assessment Data Location of Assessment: [facility] ED TTS Assessment: In system Is this a Tele or Face-to-Face Assessment?: Face-to-Face Is this an Initial Assessment or a Re-assessment for this encounter?: Initial Assessment Marital status: Single Maiden name:  (n/a) Is patient pregnant?: No Pregnancy Status: No Living Arrangements: Other (Comment) (Homeless ) Can pt return to current living arrangement?: Yes Admission Status: Voluntary Is patient capable of signing voluntary admission?: Yes Referral Source: Self/Family/Friend Insurance type:  Actor)     Crisis Care Plan Living Arrangements: Other (Comment) (Homeless ) [facility] of Psychiatrist: [facility]  (Sts, "I don't remember";prior notes indicate [facility]) Name of Therapist: [facility]   Education Status Is patient currently in school?: No Current Grade:  (n/a) Highest grade of school patient has completed:  (10th grade) Name of school: N/A Contact person: N/A  Risk to self with the past 6 months Suicidal Ideation: Yes-Currently Present Has patient been a risk to self within  the past 6 months prior to admission? : Yes Suicidal Intent: No Has patient had any suicidal intent within the past 6 months prior to admission? : No Is patient at risk for suicide?: Yes Suicidal Plan?: No Has patient had any suicidal plan within the past 6 months prior to admission? :  No Access to Means: No What has been your use of drugs/alcohol within the last 12 months?:  (pt denies) Previous Attempts/Gestures: Yes How many times?:  (1x) Other Self Harm Risks:  (pt denies ) Triggers for Past Attempts: Unpredictable Intentional Self Injurious Behavior: None Family Suicide History: No Recent stressful life event(s): Other (Comment) (Housing ) Persecutory voices/beliefs?: No Depression: Yes Depression Symptoms: Feeling angry/irritable, Feeling worthless/self pity, Loss of interest in usual pleasures, Guilt, Fatigue, Tearfulness, Isolating, Insomnia, Despondent Substance abuse history and/or treatment for substance abuse?: Yes Suicide prevention information given to non-admitted patients: Not applicable  Risk to Others within the past 6 months Homicidal Ideation: No Does patient have any lifetime risk of violence toward others beyond the six months prior to admission? : No Thoughts of Harm to Others: No Current Homicidal Intent: No Current Homicidal Plan: No Access to Homicidal Means: No Identified Victim:  (n/a) History of harm to others?: No Assessment of Violence: None Noted Violent Behavior Description:  (patient is frustrated, angry, and resistant to answering ?s) Does patient have access to weapons?: No Criminal Charges Pending?: No Does patient have a court date: No Is patient on probation?: No  Psychosis Hallucinations: Auditory, Visual Delusions: None noted  Mental Status Report Appearance/Hygiene: In scrubs, Disheveled Eye Contact: Fair Motor Activity: Unremarkable Speech: Logical/coherent Level of Consciousness: Quiet/awake Mood: Depressed, Apprehensive Affect: Appropriate to circumstance Anxiety Level: None Thought Processes: Coherent, Relevant Judgement: Unimpaired Orientation: Appropriate for developmental age Obsessive Compulsive Thoughts/Behaviors: None  Cognitive Functioning Concentration: Fair Memory: Recent Intact, Remote  Intact IQ: Average Insight: Fair Impulse Control: Fair Appetite: Good Weight Loss:  (0) Weight Gain:  (0) Sleep: Decreased Total Hours of Sleep:  ("no sleep") Vegetative Symptoms: Decreased grooming  ADLScreening [facility] Assessment Services) Patient's cognitive ability adequate to safely complete daily activities?: Yes Patient able to express need for assistance with ADLs?: Yes Independently performs ADLs?: Yes (appropriate for developmental age)  Prior Inpatient Therapy Prior Inpatient Therapy: Yes Prior Therapy Dates: 2003, 2004, 2005, 2006, 2007, 2008, 2009, 2011, 2012, 2013, 2014 [facility] Reason for Treatment: Paranoid Schizophrenia, cocaine abuse   Prior Outpatient Therapy Prior Outpatient Therapy: Yes Prior Therapy Dates: Current  [facility]): [facility]  Reason for Treatment: Medication management  Does patient have an ACCT team?: Yes Does patient have Intensive In-House Services?  : No Does patient have [facility] services? : No Does patient have P4CC services?: No  ADL Screening (condition at time of admission) Patient's cognitive ability adequate to safely complete daily activities?: Yes Is the patient deaf or have difficulty hearing?: No Does the patient have difficulty seeing, even when wearing glasses/contacts?: No Does the patient have difficulty concentrating, remembering, or making decisions?: No Patient able to express need for assistance with ADLs?: Yes Does the patient have difficulty dressing or bathing?: No Independently performs ADLs?: Yes (appropriate for developmental age) Does the patient have difficulty walking or climbing stairs?: No Weakness of Legs: None Weakness of Arms/Hands: None  Home Assistive Devices/Equipment Home Assistive Devices/Equipment: None    Abuse/Neglect Assessment (Assessment to be complete while patient is alone) Physical Abuse: Denies Verbal Abuse: Denies Sexual Abuse:  Denies Exploitation of patient/patient's resources: Denies  Self-Neglect: Denies Values / Beliefs Cultural Requests During Hospitalization: None Spiritual Requests During Hospitalization: None   Advance Directives (For Healthcare) Does patient have an advance directive?: No Would patient like information on creating an advanced directive?: No - patient declined information    Additional Information 1:1 In Past 12 Months?: No CIRT Risk: No Elopement Risk: No Does patient have medical clearance?: Yes     Disposition:  Disposition Initial Assessment Completed for this Encounter: Yes Disposition of Patient: Inpatient treatment program (Dr. Ronni Rumble, NP patient meets criteria INPT)  On Site Evaluation by:   Reviewed with Physician:    Melynda Ripple [facility] 12/30/2015 9:16 AM

## 2015-12-30 NOTE — ED Notes (Signed)
Pt has been seen and wanded by security.   
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
## 2015-12-30 NOTE — ED Notes (Addendum)
Wrong chart

## 2015-12-30 NOTE — ED Notes (Signed)
D:Pt has been asleep most of the morning. Pt was awaken by treatment team. He reports si and voices. During assessment a beeping sound was noticed. Pt has an ankle monitor on his lt ankle. Pt would not answer directly why he was wearing the monitor. TTS attempted to assess pt earlier this morning and was unable to due to pt's drowsiness. A:Pt is being monitored q 15 minute checks. R:Safety maintained in the [facility].

## 2015-12-31 DIAGNOSIS — F250 Schizoaffective disorder, bipolar type: Secondary | ICD-10-CM | POA: Diagnosis not present

## 2015-12-31 NOTE — BHH Counselor (Signed)
Received a call from [name] at [facility] regarding patient, reporting that she needs medical questions. Forwarded call to patients nurse.   Davina PokeJoVea Mattheu Brodersen, LCSW Therapeutic Triage Specialist [facility] 12/31/2015 2:39 AM

## 2015-12-31 NOTE — ED Notes (Signed)
[facility] at facility to transport [facility] to [facility] per MD order. Pt signed for personal belongings. Personal belongings given to transport service for transfer. Pt signed e-signature. Ambulatory out of facility.

## 2015-12-31 NOTE — BHH Counselor (Signed)
Received call from [name] at [facility] who reports that patient has been accepted to [facility] after 9AM on 12/31/2015 by Dr. Betti Cruzeddy. Call nurse report to [phone]. [name] was made aware that patient was not under IVC and reports that he can come voluntarily. Patients nurse  Informed.   Davina PokeJoVea Amire Leazer, LCSW Therapeutic Triage Specialist [facility] 12/31/2015 2:51 AM

## 2015-12-31 NOTE — BH Assessment (Signed)
BHH Assessment Progress Note  Per morning shift report, this pt has been accepted to [facility] by Dr Betti Cruzeddy.  This has been discussed with pt and he agrees to transfer.  Carl MinsMojeed Akintayo, MD, concurs with this decision.  Please call report to [phone].  Pt's nurse, Morrie Sheldonshley, has been notified.  Pt is to be [facility].  Justyne Roell, MA Triage Specialist [phone]

## 2016-01-01 DIAGNOSIS — F209 Schizophrenia, unspecified: Secondary | ICD-10-CM | POA: Diagnosis not present

## 2016-01-02 DIAGNOSIS — F209 Schizophrenia, unspecified: Secondary | ICD-10-CM | POA: Diagnosis not present

## 2016-01-06 DIAGNOSIS — F209 Schizophrenia, unspecified: Secondary | ICD-10-CM | POA: Diagnosis not present

## 2016-01-08 DIAGNOSIS — F209 Schizophrenia, unspecified: Secondary | ICD-10-CM | POA: Diagnosis not present

## 2016-01-31 DIAGNOSIS — R1032 Left lower quadrant pain: Secondary | ICD-10-CM | POA: Diagnosis not present

## 2016-01-31 DIAGNOSIS — R1030 Lower abdominal pain, unspecified: Secondary | ICD-10-CM | POA: Diagnosis not present

## 2016-01-31 DIAGNOSIS — M5431 Sciatica, right side: Secondary | ICD-10-CM | POA: Diagnosis not present

## 2016-01-31 DIAGNOSIS — F17210 Nicotine dependence, cigarettes, uncomplicated: Secondary | ICD-10-CM | POA: Diagnosis not present

## 2016-01-31 DIAGNOSIS — J449 Chronic obstructive pulmonary disease, unspecified: Secondary | ICD-10-CM | POA: Diagnosis not present

## 2016-01-31 DIAGNOSIS — M79604 Pain in right leg: Secondary | ICD-10-CM | POA: Diagnosis not present

## 2016-02-23 DIAGNOSIS — R404 Transient alteration of awareness: Secondary | ICD-10-CM | POA: Diagnosis not present

## 2016-02-23 DIAGNOSIS — R531 Weakness: Secondary | ICD-10-CM | POA: Diagnosis not present

## 2016-02-27 DIAGNOSIS — F259 Schizoaffective disorder, unspecified: Secondary | ICD-10-CM | POA: Diagnosis not present

## 2016-02-28 DIAGNOSIS — F250 Schizoaffective disorder, bipolar type: Secondary | ICD-10-CM | POA: Diagnosis not present

## 2016-02-29 DIAGNOSIS — F250 Schizoaffective disorder, bipolar type: Secondary | ICD-10-CM | POA: Diagnosis not present

## 2016-03-02 DIAGNOSIS — F250 Schizoaffective disorder, bipolar type: Secondary | ICD-10-CM | POA: Diagnosis not present

## 2016-05-01 DIAGNOSIS — J441 Chronic obstructive pulmonary disease with (acute) exacerbation: Secondary | ICD-10-CM | POA: Diagnosis not present

## 2016-05-01 DIAGNOSIS — R0602 Shortness of breath: Secondary | ICD-10-CM | POA: Diagnosis not present

## 2016-05-28 DIAGNOSIS — R000 Tachycardia, unspecified: Secondary | ICD-10-CM | POA: Diagnosis not present

## 2016-05-31 ENCOUNTER — Inpatient Hospital Stay: Admission: RE | Admit: 2016-05-31 | Payer: Medicaid Other | Source: Intra-hospital | Admitting: Psychiatry

## 2016-05-31 NOTE — BH Assessment (Signed)
Patient has been accepted to [facility].  Accepting physician is Dr. Toni Amendlapacs.  Attending Physician will be Dr. Ardyth HarpsHernandez.  Patient has been assigned to room 312, by [facility] Charge Nurse BingerGwen.  Call report to [phone].  Representative/Transfer Coordinator is Onur Mori.  [facility] Staff (Lindie SpruceMeghan, TTS Social Worker & Nira Retortina T., [facility]) made aware of acceptance.

## 2016-05-31 NOTE — BH Assessment (Signed)
Spoke with [facility] and she stated the patient is going to be [facility]. Writer informed [facility] Charge Nurse [place](Gwen F.).

## 2016-05-31 NOTE — BH Assessment (Signed)
Writer called [facility] ([phone]) to check on the status of him [facility]. Per the [facility] staff, the patient informed them he was feeling better and didn't want to be admitted. He stated this after he was informed he was going to be [facility].  Patient is having a re-assessment and nurse will call back with disposition.

## 2016-08-07 DIAGNOSIS — F209 Schizophrenia, unspecified: Secondary | ICD-10-CM | POA: Diagnosis not present

## 2016-08-08 DIAGNOSIS — F209 Schizophrenia, unspecified: Secondary | ICD-10-CM | POA: Diagnosis not present

## 2016-08-09 DIAGNOSIS — F209 Schizophrenia, unspecified: Secondary | ICD-10-CM | POA: Diagnosis not present

## 2016-08-10 DIAGNOSIS — F209 Schizophrenia, unspecified: Secondary | ICD-10-CM | POA: Diagnosis not present

## 2016-08-12 DIAGNOSIS — F209 Schizophrenia, unspecified: Secondary | ICD-10-CM | POA: Diagnosis not present

## 2016-08-16 DIAGNOSIS — F209 Schizophrenia, unspecified: Secondary | ICD-10-CM | POA: Diagnosis not present

## 2017-01-30 ENCOUNTER — Emergency Department (HOSPITAL_COMMUNITY)
Admission: EM | Admit: 2017-01-30 | Discharge: 2017-01-31 | Disposition: A | Discharge status: Home / Self Care | Payer: Medicare Other | Attending: Emergency Medicine | Admitting: Emergency Medicine

## 2017-01-30 ENCOUNTER — Encounter (HOSPITAL_COMMUNITY): Payer: Self-pay | Admitting: Emergency Medicine

## 2017-01-30 DIAGNOSIS — F17210 Nicotine dependence, cigarettes, uncomplicated: Secondary | ICD-10-CM | POA: Insufficient documentation

## 2017-01-30 DIAGNOSIS — R45851 Suicidal ideations: Secondary | ICD-10-CM | POA: Diagnosis not present

## 2017-01-30 DIAGNOSIS — I10 Essential (primary) hypertension: Secondary | ICD-10-CM | POA: Diagnosis not present

## 2017-01-30 DIAGNOSIS — F29 Unspecified psychosis not due to a substance or known physiological condition: Secondary | ICD-10-CM | POA: Diagnosis not present

## 2017-01-30 DIAGNOSIS — R440 Auditory hallucinations: Secondary | ICD-10-CM | POA: Diagnosis not present

## 2017-01-30 DIAGNOSIS — F251 Schizoaffective disorder, depressive type: Secondary | ICD-10-CM | POA: Diagnosis present

## 2017-01-30 DIAGNOSIS — F1410 Cocaine abuse, uncomplicated: Secondary | ICD-10-CM | POA: Diagnosis present

## 2017-01-30 DIAGNOSIS — F259 Schizoaffective disorder, unspecified: Secondary | ICD-10-CM | POA: Insufficient documentation

## 2017-01-30 DIAGNOSIS — Z79899 Other long term (current) drug therapy: Secondary | ICD-10-CM | POA: Diagnosis not present

## 2017-01-30 LAB — COMPREHENSIVE METABOLIC PANEL
ALBUMIN: 4.2 g/dL (ref 3.5–5.0)
ALK PHOS: 109 U/L (ref 38–126)
ALT: 23 U/L (ref 17–63)
ANION GAP: 6 (ref 5–15)
AST: 20 U/L (ref 15–41)
BILIRUBIN TOTAL: 1 mg/dL (ref 0.3–1.2)
BUN: 9 mg/dL (ref 6–20)
CO2: 26 mmol/L (ref 22–32)
Calcium: 9 mg/dL (ref 8.9–10.3)
Chloride: 105 mmol/L (ref 101–111)
Creatinine, Ser: 0.76 mg/dL (ref 0.61–1.24)
GFR calc Af Amer: >60 mL/min (ref >60)
GFR calc non Af Amer: >60 mL/min (ref >60)
Glucose, Bld: 101 mg/dL — ABNORMAL HIGH (ref 65–99)
POTASSIUM: 3.4 mmol/L — AB (ref 3.5–5.1)
Sodium: 137 mmol/L (ref 135–145)
TOTAL PROTEIN: 6.8 g/dL (ref 6.5–8.1)

## 2017-01-30 LAB — RAPID URINE DRUG SCREEN, HOSP PERFORMED
AMPHETAMINES: NOT DETECTED
BARBITURATES: NOT DETECTED
Benzodiazepines: NOT DETECTED
Cocaine: POSITIVE — AB
Opiates: NOT DETECTED
TETRAHYDROCANNABINOL: NOT DETECTED

## 2017-01-30 LAB — CBC
HEMATOCRIT: 42.5 % (ref 39.0–52.0)
HEMOGLOBIN: 14.3 g/dL (ref 13.0–17.0)
MCH: 31.9 pg (ref 26.0–34.0)
MCHC: 33.6 g/dL (ref 30.0–36.0)
MCV: 94.9 fL (ref 78.0–100.0)
Platelets: 193 10*3/uL (ref 150–400)
RBC: 4.48 MIL/uL (ref 4.22–5.81)
RDW: 12.8 % (ref 11.5–15.5)
WBC: 10.1 10*3/uL (ref 4.0–10.5)

## 2017-01-30 LAB — SALICYLATE LEVEL

## 2017-01-30 LAB — ACETAMINOPHEN LEVEL: Acetaminophen (Tylenol), Serum: <10 ug/mL — ABNORMAL LOW (ref 10–30)

## 2017-01-30 LAB — ETHANOL

## 2017-01-30 MED ORDER — GABAPENTIN 400 MG PO CAPS
800.0000 mg | ORAL_CAPSULE | Freq: Three times a day (TID) | ORAL | Status: DC
  Administered 2017-01-30 (×2): 800 mg via ORAL
  Filled 2017-01-30 (×2): qty 2

## 2017-01-30 MED ORDER — POTASSIUM CHLORIDE CRYS ER 20 MEQ PO TBCR
40.0000 meq | EXTENDED_RELEASE_TABLET | Freq: Once | ORAL | Status: AC
  Administered 2017-01-30: 40 meq via ORAL
  Filled 2017-01-30: qty 2

## 2017-01-30 MED ORDER — OLANZAPINE 10 MG PO TBDP
20.0000 mg | ORAL_TABLET | Freq: Every day | ORAL | Status: DC
  Administered 2017-01-30: 20 mg via ORAL
  Filled 2017-01-30: qty 2

## 2017-01-30 NOTE — Progress Notes (Signed)
Pt has been resting in his room with eyes closed and regular, even respirations since coming to [facility]. He denied SI, HI, and VH but endorsed AH. He endorsed AH. He said has a hx of command hallucinations to harm others and has acted on them in the past, but is not experiencing that now. Pt was urged to bring concerns, questions, and needs to staff. Camera monitoring, 15-minute checks, and hourly rounding is in place. Will continue to monitor for needs/safety.

## 2017-01-30 NOTE — ED Notes (Signed)
Pt observed ambulating in the hallway with a steady gait. 

## 2017-01-30 NOTE — ED Triage Notes (Addendum)
Patient arrived with EMS, Per EMS patient stated He is having hallucinations, hearing voices to plan to kill himself by jumping in a train. Per patient was in jail for 7 days and he was unable to take his medication for 8 days now. Patient complain of back pain and foot pain.

## 2017-01-30 NOTE — ED Notes (Signed)
Hourly rounding reveals patient sleeping in room. No complaints, stable, in no acute distress. Q15 minute rounds and monitoring via Security Cameras to continue. 

## 2017-01-30 NOTE — ED Notes (Signed)
Report to include situation, background, assessment and recommendations from Karen RN. Patient sleeping, respirations regular and unlabored. Q15 minute rounds and security camera observation to continue.   
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
## 2017-01-30 NOTE — ED Notes (Signed)
Pt reminded that he needs to provide a urine specimen. Pt verbalized understanding.

## 2017-01-30 NOTE — BHH Counselor (Signed)
Counselor attempted to begin assessment with Patient.  Patient refused to communicate, making statements several times, such as "Can you leave?" Patient went back to sleep and was unresponsive to questions presented.  Several attempts were made to complete assessment, Patient refused to cooperate.  Patient's RN, , was notified about Patient's unwillingness to complete assessment.  Counselor corresponded with Nira ConnJason Berry, NP, about Patient's status.  AM psych evaluation was recommended. MD was notified of Patient's behaviors during the attempted assessment.     Elmore GuiseJaniah Briona Korpela, LPCA Therapetic Triage Specialist

## 2017-01-30 NOTE — ED Notes (Signed)
Bed: [id number] Expected date:  Expected time:  Means of arrival:  Comments: 41yo M med clear

## 2017-01-30 NOTE — ED Provider Notes (Signed)
[facility]ENCY DEPT Provider Note   CSN: [id number] Arrival date & time: 01/30/17  0130     History   Chief Complaint Chief Complaint  Patient presents with  . Hallucinations  . Suicidal    HPI [name] is a 41 y.o. male.  HPI   Patient is a 41 year old male with history of hypertension and schizophrenia who presents the ED with complaint of SI. EMS reports patient endorse having auditory hallucinations and states he was hearing voices telling him to kill himself by jumping in front of a train. He notes he was in jail for 7 days and was released yesterday but notes he has not been on his medication for the past 8 days now. Patient endorses having SI with plan but denies any intent. Endorses auditory hallucinations. Denies homicidal ideations. Denies any recent alcohol or drug use. He also reports having chronic left lower back pain which is unchanged in nature. Denies any recent fall, trauma or injury. Pt denies fever, numbness, tingling, saddle anesthesia, loss of bowel or bladder, weakness, IVDU, cancer or recent spinal manipulation.   Past Medical History:  Diagnosis Date  . Boxer's fracture   . Hypertension   . Schizophrenic disorder [facility](HCC)     Patient Active Problem List   Diagnosis Date Noted  . Substance induced mood disorder (HCC) 11/29/2015  . Homeless   . Suicidal ideation   . Schizoaffective disorder, unspecified type (HCC)   . Schizoaffective disorder (HCC) 05/08/2013  . Cocaine abuse 11/11/2011    History reviewed. No pertinent surgical history.     Home Medications    Prior to Admission medications   Medication Sig Start Date End Date Taking? Authorizing Provider  gabapentin (NEURONTIN) 300 MG capsule Take 1 capsule (300 mg total) by mouth 3 (three) times daily. For agitation Patient not taking: Reported on 11/29/2015 05/23/15   Armandina StammerNwoko, Agnes I, NP  ibuprofen (ADVIL,MOTRIN) 800 MG tablet Take 1 tablet (800 mg total) by mouth 3 (three) times daily. Pain  management Patient not taking: Reported on 11/29/2015 08/04/15   Thermon Leylandavis, Laura A, NP  OLANZapine zydis (ZYPREXA) 10 MG disintegrating tablet Take 1 tablet (10 mg total) by mouth 2 (two) times daily. Patient not taking: Reported on 11/29/2015 08/04/15   Thermon Leylandavis, Laura A, NP  OLANZapine zydis (ZYPREXA) 20 MG disintegrating tablet Take 20 mg by mouth at bedtime.    [provider]  risperiDONE microspheres (RISPERDAL CONSTA) 37.5 MG injection Inject 37.5 mg into the muscle every 14 (fourteen) days.    [provider]  traZODone (DESYREL) 50 MG tablet Take 1 tablet (50 mg total) by mouth at bedtime as needed and may repeat dose one time if needed for sleep. Patient not taking: Reported on 11/29/2015 05/23/15   Sanjuana KavaNwoko, Agnes I, NP    Family History History reviewed. No pertinent family history.  Social History Social History  Substance Use Topics  . Smoking status: Current Every Day Smoker    Packs/day: 1.00    Years: 15.00    Types: Cigarettes  . Smokeless tobacco: Never Used  . Alcohol use No     Comment: seldom     Allergies   Haldol [haloperidol decanoate]   Review of Systems Review of Systems  Musculoskeletal: Positive for back pain (chronic).  Psychiatric/Behavioral: Positive for hallucinations and suicidal ideas.  All other systems reviewed and are negative.    Physical Exam Updated Vital Signs BP 128/85 (BP Location: Left Arm)   Pulse 99   Temp 97.8  F (36.6 C) (Oral)   Resp (!) 28   SpO2 95%   Physical Exam  Constitutional: He is oriented to person, place, and time. He appears well-developed and well-nourished. No distress.  HENT:  Head: Normocephalic and atraumatic.  Mouth/Throat: Oropharynx is clear and moist. No oropharyngeal exudate.  Eyes: Conjunctivae and EOM are normal. Pupils are equal, round, and reactive to light. Right eye exhibits no discharge. Left eye exhibits no discharge. No scleral icterus.  Neck: Normal range of motion. Neck supple.    Cardiovascular: Normal rate, regular rhythm, normal heart sounds and intact distal pulses.   Pulmonary/Chest: Effort normal and breath sounds normal. No respiratory distress. He has no wheezes. He has no rales. He exhibits no tenderness.  Abdominal: Soft. Bowel sounds are normal. He exhibits no distension and no mass. There is no tenderness. There is no rebound and no guarding.  Musculoskeletal: Normal range of motion. He exhibits tenderness. He exhibits no edema or deformity.  No midline C, T, or L tenderness. Mild TTP over left lumbar paraspinal muscles. Full range of motion of neck and back. Full range of motion of bilateral upper and lower extremities, with 5/5 strength. Sensation intact. 2+ radial and PT pulses. Cap refill <2 seconds.   Neurological: He is alert and oriented to person, place, and time.  Skin: Skin is warm and dry. He is not diaphoretic.  Psychiatric: His speech is normal. He is withdrawn. Cognition and memory are normal. He expresses inappropriate judgment. He exhibits a depressed mood. He expresses suicidal ideation. He expresses suicidal plans.  Nursing note and vitals reviewed.    ED Treatments / Results  Labs (all labs ordered are listed, but only abnormal results are displayed) Labs Reviewed  COMPREHENSIVE METABOLIC PANEL - Abnormal; Notable for the following:       Result Value   Potassium 3.4 (*)    Glucose, Bld 101 (*)    All other components within normal limits  ACETAMINOPHEN LEVEL - Abnormal; Notable for the following:    Acetaminophen (Tylenol), Serum <10 (*)    All other components within normal limits  ETHANOL  SALICYLATE LEVEL  CBC  RAPID URINE DRUG SCREEN, HOSP PERFORMED    EKG  EKG Interpretation None       Radiology No results found.  Procedures Procedures (including critical care time)  Medications Ordered in ED Medications - No data to display   Initial Impression / Assessment and Plan / ED Course  I have reviewed the triage  vital signs and the nursing notes.  Pertinent labs & imaging results that were available during my care of the patient were reviewed by me and considered in my medical decision making (see chart for details).     Patient presents with suicide ideation and auditory hallucinations for the past week after he was in jail and not receiving his home medications. Reports having chronic back pain which has continued to bother him while he was in jail this week but denies any acute changes. Denies any recent fall, trauma or injury. No back pain red flags. VSS. Exam unremarkable. Labs unremarkable. Patient medically cleared. Consulted TTS.   Final Clinical Impressions(s) / ED Diagnoses   Final diagnoses:  Suicidal ideation  Auditory hallucinations    New Prescriptions New Prescriptions   No medications on file     Barrett Henle, Cordelia Poche 01/30/17 0410    Dione Booze, MD 01/30/17 [phone]

## 2017-01-30 NOTE — ED Notes (Signed)
Pt eating his lunch without emesis.

## 2017-01-30 NOTE — ED Notes (Signed)
Security wanded pt and [facility].

## 2017-01-31 DIAGNOSIS — F17210 Nicotine dependence, cigarettes, uncomplicated: Secondary | ICD-10-CM

## 2017-01-31 DIAGNOSIS — F259 Schizoaffective disorder, unspecified: Secondary | ICD-10-CM | POA: Diagnosis not present

## 2017-01-31 MED ORDER — OLANZAPINE 20 MG PO TBDP: 20.0000 mg | ORAL_TABLET | Freq: Every day | ORAL | 0 refills | Status: DC

## 2017-01-31 MED ORDER — GABAPENTIN 100 MG PO CAPS: 200.0000 mg | ORAL_CAPSULE | Freq: Three times a day (TID) | ORAL | 0 refills | Status: DC

## 2017-01-31 MED ORDER — GABAPENTIN 100 MG PO CAPS: 200.0000 mg | ORAL_CAPSULE | Freq: Three times a day (TID) | ORAL | Status: DC

## 2017-01-31 NOTE — Consult Note (Signed)
[name]   Reason for [name]:  Suicidal and hallucinations Referring Physician:  EDP Patient Identification: [name] MRN:  [id number] Principal Diagnosis: Schizoaffective disorder, unspecified type [facility]) Diagnosis:   Patient Active Problem List   Diagnosis Date Noted  . Schizoaffective disorder, unspecified type ([name]) [F25.9]     Priority: High  . Cocaine abuse [F14.10] 11/11/2011    Priority: High  . Substance induced mood disorder ([place]) [F19.94] 11/29/2015  . Homeless [Z59.0]   . Suicidal ideation [R45.851]   . Schizoaffective disorder ([name]) [F25.9] 05/08/2013    Total Time spent with patient: 45 minutes  Subjective:   [name] is a 41 y.o. male patient does not warrant admission.  HPI:  41 yo male who presented to the ED after using cocaine with suicidal ideations and hallucinations.  Denies suicidal/homicidal ideations, hallucinations, and withdrawal symptoms today.  He was recently in jail and had not had his medications which were restarted yesterday.  Encouraged him to refrain from cocaine abuse.  Stable for discharge, Rx provided.  Past Psychiatric History: substance abuse, schizoaffective disorder  Risk to Self: None Risk to Others: Homicidal Ideation: No Thoughts of Harm to Others: No Current Homicidal Intent: No Current Homicidal Plan: No Access to Homicidal Means: No History of harm to others?:  (UTA) Assessment of Violence: None Noted Does patient have access to weapons?: No Criminal Charges Pending?:  (denies) Does patient have a court date:  (denies) Prior Inpatient Therapy: Prior Inpatient Therapy: Yes Prior Therapy Dates:  (UTA) Prior Therapy Facilty/Provider(s):  ([place], etc., pt could not elaborate) Reason for Treatment: UNk Prior Outpatient Therapy: Prior Outpatient Therapy: No Prior Therapy Dates:  (UTA) Prior Therapy Facilty/Provider(s):  (UTA) Reason for Treatment:  (UTA) Does patient have an ACCT team?:  Unknown Does patient have Intensive In-House Services?  : No Does patient have [facility] services? : No Does patient have P4CC services?: Unknown  Past Medical History:  Past Medical History:  Diagnosis Date  . Boxer's fracture   . Hypertension   . Schizophrenic disorder ([name])    History reviewed. No pertinent surgical history. Family History: History reviewed. No pertinent family history. Family Psychiatric  History: unknown Social History:  History  Alcohol Use No    Comment: seldom     History  Drug Use No    Social History   Social History  . Marital status: Single    Spouse name: N/A  . Number of children: N/A  . Years of education: N/A   Social History Main Topics  . Smoking status: Current Every Day Smoker    Packs/day: 1.00    Years: 15.00    Types: Cigarettes  . Smokeless tobacco: Never Used  . Alcohol use No     Comment: seldom  . Drug use: No  . Sexual activity: No   Other Topics Concern  . None   Social History Narrative   ** Merged History Encounter **       Additional Social History:    Allergies:   Allergies  Allergen Reactions  . Haldol [Haloperidol Decanoate] Other (See Comments)    'I curl up"    Labs:  Results for orders placed or performed during the hospital encounter of 01/30/17 (from the past 48 hour(s))  Rapid urine drug screen (hospital performed)     Status: Abnormal   Collection Time: 01/30/17  1:56 AM  Result Value Ref Range   Opiates NONE DETECTED NONE DETECTED   Cocaine POSITIVE (A) NONE  DETECTED   Benzodiazepines NONE DETECTED NONE DETECTED   Amphetamines NONE DETECTED NONE DETECTED   Tetrahydrocannabinol NONE DETECTED NONE DETECTED   Barbiturates NONE DETECTED NONE DETECTED    Comment:        DRUG SCREEN FOR MEDICAL PURPOSES ONLY.  IF CONFIRMATION IS NEEDED FOR ANY PURPOSE, NOTIFY LAB WITHIN 5 DAYS.        LOWEST DETECTABLE LIMITS FOR URINE DRUG SCREEN Drug Class       Cutoff (ng/mL) Amphetamine       1000 Barbiturate      200 Benzodiazepine   706 Tricyclics       237 Opiates          300 Cocaine          300 THC              50   Comprehensive metabolic panel     Status: Abnormal   Collection Time: 01/30/17  2:00 AM  Result Value Ref Range   Sodium 137 135 - 145 mmol/L   Potassium 3.4 (L) 3.5 - 5.1 mmol/L   Chloride 105 101 - 111 mmol/L   CO2 26 22 - 32 mmol/L   Glucose, Bld 101 (H) 65 - 99 mg/dL   BUN 9 6 - 20 mg/dL   Creatinine, Ser 0.76 0.61 - 1.24 mg/dL   Calcium 9.0 8.9 - 10.3 mg/dL   Total Protein 6.8 6.5 - 8.1 g/dL   Albumin 4.2 3.5 - 5.0 g/dL   AST 20 15 - 41 U/L   ALT 23 17 - 63 U/L   Alkaline Phosphatase 109 38 - 126 U/L   Total Bilirubin 1.0 0.3 - 1.2 mg/dL   GFR calc non Af Amer >60 >60 mL/min   GFR calc Af Amer >60 >60 mL/min    Comment: (NOTE) The eGFR has been calculated using the CKD EPI equation. This calculation has not been validated in all clinical situations. eGFR's persistently <60 mL/min signify possible Chronic Kidney Disease.    Anion gap 6 5 - 15  Ethanol     Status: None   Collection Time: 01/30/17  2:00 AM  Result Value Ref Range   Alcohol, Ethyl (B) <5 <5 mg/dL    Comment:        LOWEST DETECTABLE LIMIT FOR SERUM ALCOHOL IS 5 mg/dL FOR MEDICAL PURPOSES ONLY   Salicylate level     Status: None   Collection Time: 01/30/17  2:00 AM  Result Value Ref Range   Salicylate Lvl <6.2 2.8 - 30.0 mg/dL  Acetaminophen level     Status: Abnormal   Collection Time: 01/30/17  2:00 AM  Result Value Ref Range   Acetaminophen (Tylenol), Serum <10 (L) 10 - 30 ug/mL    Comment:        THERAPEUTIC CONCENTRATIONS VARY SIGNIFICANTLY. A RANGE OF 10-30 ug/mL MAY BE AN EFFECTIVE CONCENTRATION FOR MANY PATIENTS. HOWEVER, SOME ARE BEST TREATED AT CONCENTRATIONS OUTSIDE THIS RANGE. ACETAMINOPHEN CONCENTRATIONS >150 ug/mL AT 4 HOURS AFTER INGESTION AND >50 ug/mL AT 12 HOURS AFTER INGESTION ARE OFTEN ASSOCIATED WITH TOXIC REACTIONS.   cbc     Status:  None   Collection Time: 01/30/17  2:00 AM  Result Value Ref Range   WBC 10.1 4.0 - 10.5 K/uL   RBC 4.48 4.22 - 5.81 MIL/uL   Hemoglobin 14.3 13.0 - 17.0 g/dL   HCT 42.5 39.0 - 52.0 %   MCV 94.9 78.0 - 100.0 fL   MCH 31.9 26.0 -  34.0 pg   MCHC 33.6 30.0 - 36.0 g/dL   RDW 12.8 11.5 - 15.5 %   Platelets 193 150 - 400 K/uL    Current Facility-Administered Medications  Medication Dose Route Frequency Provider Last Rate Last Dose  . gabapentin (NEURONTIN) capsule 800 mg  800 mg Oral TID Jola Schmidt, MD   Stopped at 01/31/17 [phone]  . OLANZapine zydis (ZYPREXA) disintegrating tablet 20 mg  20 mg Oral QHS Jola Schmidt, MD   20 mg at 01/30/17 2120   Current Outpatient Prescriptions  Medication Sig Dispense Refill  . gabapentin (NEURONTIN) 800 MG tablet Take 800 mg by mouth 3 (three) times daily.    . mometasone (NASONEX) 50 MCG/ACT nasal spray Place 2 sprays into the nose daily.    Marland Kitchen OLANZapine zydis (ZYPREXA) 20 MG disintegrating tablet Take 20 mg by mouth at bedtime.    . risperiDONE microspheres (RISPERDAL CONSTA) 37.5 MG injection Inject 37.5 mg into the muscle every 14 (fourteen) days.      Musculoskeletal: Strength & Muscle Tone: within normal limits Gait & Station: normal Patient leans: N/A  Psychiatric Specialty Exam: Physical Exam  Constitutional: He is oriented to person, place, and time. He appears well-developed and well-nourished.  HENT:  Head: Normocephalic.  Neck: Normal range of motion.  Respiratory: Effort normal.  Musculoskeletal: Normal range of motion.  Neurological: He is alert and oriented to person, place, and time.  Psychiatric: He has a normal mood and affect. His speech is normal and behavior is normal. Judgment and thought content normal. Cognition and memory are normal.    Review of Systems  Psychiatric/Behavioral: Positive for substance abuse.  All other systems reviewed and are negative.   Blood pressure 126/75, pulse 89, temperature 98.2 F (36.8  C), temperature source Oral, resp. rate 16, SpO2 96 %.There is no height or weight on file to calculate BMI.  General Appearance: Casual  Eye Contact:  Good  Speech:  Normal Rate  Volume:  Normal  Mood:  Euthymic  Affect:  Congruent  Thought Process:  Coherent and Descriptions of Associations: Intact  Orientation:  Full (Time, Place, and Person)  Thought Content:  WDL and Logical  Suicidal Thoughts:  No  Homicidal Thoughts:  No  Memory:  Immediate;   Good Recent;   Good Remote;   Good  Judgement:  Fair  Insight:  Fair  Psychomotor Activity:  Normal  Concentration:  Concentration: Good and Attention Span: Good  Recall:  Good  Fund of Knowledge:  Fair  Language:  Good  Akathisia:  No  Handed:  Right  AIMS (if indicated):     Assets:  Leisure Time Physical Health Resilience  ADL's:  Intact  Cognition:  WNL  Sleep:        Treatment Plan Summary: Daily contact with patient to assess and evaluate symptoms and progress in treatment, Medication management and Plan schizoaffective disorder, unspecified type:  -Crisis stabilization -Medication management:  Started Zyprexa 20 mg at bedtime for mood/hallucinations and gabapentin 200 mg TID for cocaine dependence -Individual and substance abuse counseling -Rx provided  Disposition: No evidence of imminent risk to self or others at present.    Waylan Boga, NP 01/31/2017 11:25 AM  Patient seen face-to-face for psychiatric evaluation, chart reviewed and case discussed with the physician extender and developed treatment plan. Reviewed the information documented and agree with the treatment plan. Corena Pilgrim, MD

## 2017-01-31 NOTE — ED Notes (Signed)
Hourly rounding reveals patient sleeping in room. No complaints, stable, in no acute distress. Q15 minute rounds and monitoring via Security Cameras to continue. 

## 2017-01-31 NOTE — ED Notes (Signed)
Patient spent most of day in room, denied SI HI and AVH.  Condition stable, vitals stable, denied pain.  AVS and list of referrals given to patient with prescriptions.  Belongings returned.  To follow up on an outpatient basis.

## 2017-01-31 NOTE — [facility] Suicide Risk Assessment (Signed)
Suicide Risk Assessment  Discharge Assessment   [facility]   Principal Problem: Schizoaffective disorder, unspecified type [facility](HCC) Discharge Diagnoses:  Patient Active Problem List   Diagnosis Date Noted  . Schizoaffective disorder, unspecified type (HCC) [F25.9]     Priority: High  . Cocaine abuse [F14.10] 11/11/2011    Priority: High  . Substance induced mood disorder (HCC) [F19.94] 11/29/2015  . Homeless [Z59.0]   . Suicidal ideation [R45.851]   . Schizoaffective disorder (HCC) [F25.9] 05/08/2013    Total Time spent with patient: 45 minutes  Musculoskeletal: Strength & Muscle Tone: within normal limits Gait & Station: normal Patient leans: N/A  Psychiatric Specialty Exam: Physical Exam  Constitutional: He is oriented to person, place, and time. He appears well-developed and well-nourished.  HENT:  Head: Normocephalic.  Neck: Normal range of motion.  Respiratory: Effort normal.  Musculoskeletal: Normal range of motion.  Neurological: He is alert and oriented to person, place, and time.  Psychiatric: He has a normal mood and affect. His speech is normal and behavior is normal. Judgment and thought content normal. Cognition and memory are normal.    Review of Systems  Psychiatric/Behavioral: Positive for substance abuse.  All other systems reviewed and are negative.   Blood pressure 126/75, pulse 89, temperature 98.2 F (36.8 C), temperature source Oral, resp. rate 16, SpO2 96 %.There is no height or weight on file to calculate BMI.  General Appearance: Casual  Eye Contact:  Good  Speech:  Normal Rate  Volume:  Normal  Mood:  Euthymic  Affect:  Congruent  Thought Process:  Coherent and Descriptions of Associations: Intact  Orientation:  Full (Time, Place, and Person)  Thought Content:  WDL and Logical  Suicidal Thoughts:  No  Homicidal Thoughts:  No  Memory:  Immediate;   Good Recent;   Good Remote;   Good  Judgement:  Fair  Insight:   Fair  Psychomotor Activity:  Normal  Concentration:  Concentration: Good and Attention Span: Good  Recall:  Good  Fund of Knowledge:  Fair  Language:  Good  Akathisia:  No  Handed:  Right  AIMS (if indicated):     Assets:  Leisure Time Physical Health Resilience  ADL's:  Intact  Cognition:  WNL  Sleep:      Mental Status Per Nursing Assessment::   On Admission:   suicidal ideations and hallucinations  Demographic Factors:  Male and Caucasian  Loss Factors: NA  Historical Factors: NA  Risk Reduction Factors:   Sense of responsibility to family, Living with another person, especially a relative and Positive therapeutic relationship  Continued Clinical Symptoms:  None  Cognitive Features That Contribute To Risk:  None    Suicide Risk:  Minimal: No identifiable suicidal ideation.  Patients presenting with no risk factors but with morbid ruminations; may be classified as minimal risk based on the severity of the depressive symptoms    Plan Of Care/Follow-up recommendations:  Activity:  as tolerated Diet:  heart healthy diet  Arlen Legendre, NP 01/31/2017, 11:42 AM

## 2017-01-31 NOTE — BH Assessment (Signed)
Per Dr. Jannifer FranklinAkintayo and Nanine MeansJamison Lord, DNP, patient is psychiatrically cleared. Patient given a list of mental health outpatient referrals prior to discharge

## 2017-03-29 ENCOUNTER — Encounter (HOSPITAL_COMMUNITY): Payer: Self-pay

## 2017-03-29 DIAGNOSIS — R440 Auditory hallucinations: Secondary | ICD-10-CM | POA: Diagnosis not present

## 2017-03-29 DIAGNOSIS — F329 Major depressive disorder, single episode, unspecified: Secondary | ICD-10-CM | POA: Diagnosis not present

## 2017-03-29 DIAGNOSIS — F209 Schizophrenia, unspecified: Secondary | ICD-10-CM | POA: Diagnosis not present

## 2017-03-29 DIAGNOSIS — F39 Unspecified mood [affective] disorder: Secondary | ICD-10-CM | POA: Diagnosis not present

## 2017-03-29 DIAGNOSIS — R45851 Suicidal ideations: Secondary | ICD-10-CM | POA: Insufficient documentation

## 2017-03-29 DIAGNOSIS — L739 Follicular disorder, unspecified: Secondary | ICD-10-CM | POA: Diagnosis not present

## 2017-03-29 DIAGNOSIS — Z79899 Other long term (current) drug therapy: Secondary | ICD-10-CM | POA: Diagnosis not present

## 2017-03-29 DIAGNOSIS — F17210 Nicotine dependence, cigarettes, uncomplicated: Secondary | ICD-10-CM | POA: Diagnosis not present

## 2017-03-29 DIAGNOSIS — I10 Essential (primary) hypertension: Secondary | ICD-10-CM | POA: Insufficient documentation

## 2017-03-29 LAB — COMPREHENSIVE METABOLIC PANEL
ALBUMIN: 4.1 g/dL (ref 3.5–5.0)
ALK PHOS: 125 U/L (ref 38–126)
ALT: 14 U/L — ABNORMAL LOW (ref 17–63)
ANION GAP: 9 (ref 5–15)
AST: 19 U/L (ref 15–41)
BUN: 5 mg/dL — ABNORMAL LOW (ref 6–20)
CALCIUM: 9.2 mg/dL (ref 8.9–10.3)
CO2: 25 mmol/L (ref 22–32)
Chloride: 104 mmol/L (ref 101–111)
Creatinine, Ser: 0.93 mg/dL (ref 0.61–1.24)
GFR calc non Af Amer: >60 mL/min (ref >60)
GLUCOSE: 97 mg/dL (ref 65–99)
POTASSIUM: 3 mmol/L — AB (ref 3.5–5.1)
SODIUM: 138 mmol/L (ref 135–145)
TOTAL PROTEIN: 7.2 g/dL (ref 6.5–8.1)
Total Bilirubin: 1.5 mg/dL — ABNORMAL HIGH (ref 0.3–1.2)

## 2017-03-29 LAB — CBC
HCT: 44.2 % (ref 39.0–52.0)
HEMOGLOBIN: 15.2 g/dL (ref 13.0–17.0)
MCH: 31.7 pg (ref 26.0–34.0)
MCHC: 34.4 g/dL (ref 30.0–36.0)
MCV: 92.3 fL (ref 78.0–100.0)
PLATELETS: 200 10*3/uL (ref 150–400)
RBC: 4.79 MIL/uL (ref 4.22–5.81)
RDW: 13.1 % (ref 11.5–15.5)
WBC: 7.9 10*3/uL (ref 4.0–10.5)

## 2017-03-29 LAB — RAPID URINE DRUG SCREEN, HOSP PERFORMED
Amphetamines: NOT DETECTED
Barbiturates: NOT DETECTED
Benzodiazepines: NOT DETECTED
COCAINE: POSITIVE — AB
OPIATES: NOT DETECTED
TETRAHYDROCANNABINOL: NOT DETECTED

## 2017-03-29 NOTE — ED Triage Notes (Signed)
Pt with PMHx of Schizophrenia and reports he is hearing voices and has not taken any of his meds in 5 days. He is having trouble sleeping, last slept 2-3 days ago. He also verbalizes wounds on his leg, "its a boil" that he would also like to be assessed. He reports thoughts of SI "i never do it but I always think about it." When asked what the voices are saying, pt changes the subject.

## 2017-03-30 ENCOUNTER — Emergency Department (HOSPITAL_COMMUNITY)
Admission: EM | Admit: 2017-03-30 | Discharge: 2017-03-31 | Disposition: A | Discharge status: Home / Self Care | Payer: Medicare Other | Attending: Emergency Medicine | Admitting: Emergency Medicine

## 2017-03-30 DIAGNOSIS — F32A Depression, unspecified: Secondary | ICD-10-CM

## 2017-03-30 DIAGNOSIS — F329 Major depressive disorder, single episode, unspecified: Secondary | ICD-10-CM

## 2017-03-30 DIAGNOSIS — R45851 Suicidal ideations: Secondary | ICD-10-CM

## 2017-03-30 DIAGNOSIS — R440 Auditory hallucinations: Secondary | ICD-10-CM

## 2017-03-30 DIAGNOSIS — L739 Follicular disorder, unspecified: Secondary | ICD-10-CM

## 2017-03-30 LAB — SALICYLATE LEVEL

## 2017-03-30 LAB — ETHANOL

## 2017-03-30 LAB — ACETAMINOPHEN LEVEL

## 2017-03-30 MED ORDER — ONDANSETRON HCL 4 MG PO TABS: 4.0000 mg | ORAL_TABLET | Freq: Three times a day (TID) | ORAL | Status: DC | PRN

## 2017-03-30 MED ORDER — OLANZAPINE 5 MG PO TBDP
20.0000 mg | ORAL_TABLET | Freq: Every day | ORAL | Status: DC
  Administered 2017-03-30: 20 mg via ORAL
  Filled 2017-03-30: qty 4

## 2017-03-30 MED ORDER — GABAPENTIN 400 MG PO CAPS
800.0000 mg | ORAL_CAPSULE | Freq: Three times a day (TID) | ORAL | Status: DC
  Administered 2017-03-30 – 2017-03-31 (×3): 800 mg via ORAL
  Filled 2017-03-30 (×3): qty 2

## 2017-03-30 MED ORDER — RISPERIDONE 2 MG PO TBDP: 2.0000 mg | ORAL_TABLET | Freq: Three times a day (TID) | ORAL | Status: DC | PRN

## 2017-03-30 MED ORDER — ZOLPIDEM TARTRATE 5 MG PO TABS: 5.0000 mg | ORAL_TABLET | Freq: Every evening | ORAL | Status: DC | PRN

## 2017-03-30 MED ORDER — LORAZEPAM 1 MG PO TABS: 1.0000 mg | ORAL_TABLET | ORAL | Status: DC | PRN

## 2017-03-30 MED ORDER — NICOTINE 21 MG/24HR TD PT24
21.0000 mg | MEDICATED_PATCH | Freq: Every day | TRANSDERMAL | Status: DC
  Filled 2017-03-30: qty 1

## 2017-03-30 MED ORDER — DOXYCYCLINE HYCLATE 100 MG PO TABS
100.0000 mg | ORAL_TABLET | Freq: Two times a day (BID) | ORAL | Status: DC
  Administered 2017-03-30 – 2017-03-31 (×3): 100 mg via ORAL
  Filled 2017-03-30 (×3): qty 1

## 2017-03-30 MED ORDER — ACETAMINOPHEN 325 MG PO TABS: 650.0000 mg | ORAL_TABLET | ORAL | Status: DC | PRN

## 2017-03-30 MED ORDER — ZIPRASIDONE MESYLATE 20 MG IM SOLR: 20.0000 mg | INTRAMUSCULAR | Status: DC | PRN

## 2017-03-30 NOTE — ED Notes (Signed)
Patient waiting in triage.

## 2017-03-30 NOTE — ED Notes (Signed)
[place] [facility] de[facility] called back reporting pt would be transported between 0830-0900 tomorrow

## 2017-03-30 NOTE — ED Provider Notes (Signed)
[facility] by Dr. Guss Bundehalla.  Patient well appearing and without complaints. Vitals stable. ED course reviewed.  Medically cleared for ongoing psychiatric treatment.   Lavera GuiseLiu, Javarus Dorner Duo, MD 03/30/17 [phone]

## 2017-03-30 NOTE — ED Provider Notes (Signed)
[facility]-EMERGENCY DEPT Provider Note   CSN: [id number] Arrival date & time: 03/29/17  2130     History   Chief Complaint Chief Complaint  Patient presents with  . Suicidal    HPI [name] is a 41 y.o. male.  Patient presents to the ER for evaluation of depression. Patient reports that he has been hearing voices. He has not been taking his medication for the last 5 days because his medications make him feel bad. Voices are telling him things but he will not tell me exactly what the voices are saying. He is worrying about harming himself. He reports that he is very depressed and he is thinking about killing himself.  Patient also concerned about sores on his right lower leg. He thinks he has a boil. He reports a red raised spots that are tender.      Past Medical History:  Diagnosis Date  . Boxer's fracture   . Hypertension   . Schizophrenic disorder [facility](HCC)     Patient Active Problem List   Diagnosis Date Noted  . Substance induced mood disorder (HCC) 11/29/2015  . Homeless   . Suicidal ideation   . Schizoaffective disorder, unspecified type (HCC)   . Schizoaffective disorder (HCC) 05/08/2013  . Cocaine abuse 11/11/2011    History reviewed. No pertinent surgical history.     Home Medications    Prior to Admission medications   Medication Sig Start Date End Date Taking? Authorizing Provider  gabapentin (NEURONTIN) 100 MG capsule Take 2 capsules (200 mg total) by mouth 3 (three) times daily. 01/31/17   Charm RingsLord, Jamison Y, NP  gabapentin (NEURONTIN) 800 MG tablet Take 800 mg by mouth 3 (three) times daily.    [provider]  mometasone (NASONEX) 50 MCG/ACT nasal spray Place 2 sprays into the nose daily.    [provider]  OLANZapine zydis (ZYPREXA) 20 MG disintegrating tablet Take 1 tablet (20 mg total) by mouth at bedtime. 01/31/17   Charm RingsLord, Jamison Y, NP  risperiDONE microspheres (RISPERDAL CONSTA) 37.5 MG injection Inject 37.5 mg into the muscle every  14 (fourteen) days.    [provider]    Family History No family history on file.  Social History Social History  Substance Use Topics  . Smoking status: Current Every Day Smoker    Packs/day: 1.00    Years: 15.00    Types: Cigarettes  . Smokeless tobacco: Never Used  . Alcohol use No     Comment: seldom     Allergies   Haldol [haloperidol decanoate]   Review of Systems Review of Systems  Skin: Positive for rash.  Psychiatric/Behavioral: Positive for dysphoric mood, sleep disturbance and suicidal ideas.  All other systems reviewed and are negative.    Physical Exam Updated Vital Signs BP 133/81 (BP Location: Left Arm)   Pulse 94   Temp 98 F (36.7 C) (Oral)   Resp 17   SpO2 97%   Physical Exam  Constitutional: He is oriented to person, place, and time. He appears well-developed and well-nourished. No distress.  HENT:  Head: Normocephalic and atraumatic.  Right Ear: Hearing normal.  Left Ear: Hearing normal.  Nose: Nose normal.  Mouth/Throat: Oropharynx is clear and moist and mucous membranes are normal.  Eyes: Conjunctivae and EOM are normal. Pupils are equal, round, and reactive to light.  Neck: Normal range of motion. Neck supple.  Cardiovascular: Regular rhythm, S1 normal and S2 normal.  Exam reveals no gallop and no friction rub.  No murmur heard. Pulmonary/Chest: Effort normal and breath sounds normal. No respiratory distress. He exhibits no tenderness.  Abdominal: Soft. Normal appearance and bowel sounds are normal. There is no hepatosplenomegaly. There is no tenderness. There is no rebound, no guarding, no tenderness at McBurney's point and negative Murphy's sign. No hernia.  Musculoskeletal: Normal range of motion.  Neurological: He is alert and oriented to person, place, and time. He has normal strength. No cranial nerve deficit or sensory deficit. Coordination normal. GCS eye subscore is 4. GCS verbal subscore is 5. GCS motor subscore is 6.   Skin: Skin is warm, dry and intact. No rash noted. No cyanosis.  Several erythematous, slightly raised areas on right lower leg with no associated fluctuance or induration  Psychiatric: He has a normal mood and affect. His speech is normal and behavior is normal. Thought content normal.  Nursing note and vitals reviewed.    ED Treatments / Results  Labs (all labs ordered are listed, but only abnormal results are displayed) Labs Reviewed  COMPREHENSIVE METABOLIC PANEL - Abnormal; Notable for the following:       Result Value   Potassium 3.0 (*)    BUN 5 (*)    ALT 14 (*)    Total Bilirubin 1.5 (*)    All other components within normal limits  ACETAMINOPHEN LEVEL - Abnormal; Notable for the following:    Acetaminophen (Tylenol), Serum <10 (*)    All other components within normal limits  RAPID URINE DRUG SCREEN, HOSP PERFORMED - Abnormal; Notable for the following:    Cocaine POSITIVE (*)    All other components within normal limits  ETHANOL  SALICYLATE LEVEL  CBC    EKG  EKG Interpretation None       Radiology No results found.  Procedures Procedures (including critical care time)  Medications Ordered in ED Medications - No data to display   Initial Impression / Assessment and Plan / ED Course  I have reviewed the triage vital signs and the nursing notes.  Pertinent labs & imaging results that were available during my care of the patient were reviewed by me and considered in my medical decision making (see chart for details).     Patient presents to the ER for evaluation of depression, auditory hallucinations and suicidal ideation. Patient has been off of his medications. He is here voluntarily, etc. he needs to talk to psychiatry about further treatment and possible admission. Patient is medically clear for psychiatric evaluation and treatment. He does have evidence of a mild folliculitis on his right leg, will initiate doxycycline twice a day for 10 full  days.  Final Clinical Impressions(s) / ED Diagnoses   Final diagnoses:  Depression, unspecified depression type  Suicidal ideations  Auditory hallucinations  Folliculitis    New Prescriptions New Prescriptions   No medications on file     Gilda Crease, MD 03/30/17 [phone]

## 2017-03-30 NOTE — Progress Notes (Addendum)
CSW [facility]'s [facility] and [facility].  [facility].  Timmothy EulerJean T. Kaylyn LimSutter, MSW, LCSWA Clinical Social Work Disposition [phone]   [facility] to transport pt on 03/31/17 between 8:30-9:00 AM.

## 2017-03-30 NOTE — ED Notes (Signed)
Pt doing tts at this time 

## 2017-03-30 NOTE — ED Notes (Signed)
IVC [facility]

## 2017-03-30 NOTE — Progress Notes (Signed)
Patient meets criteria for inpatient treatment. CSW faxed referrals to the following inpatient facilities for review:  [facility], [facility], [facility], [facility], [facility], [facility], [facility], [address], [facility], [facility], [facility], [facility]   TTS will continue to seek bed placement.  Baldo DaubJolan Caleen Taaffe MSW, LCSWA CSW Disposition [phone]
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
## 2017-03-30 NOTE — Progress Notes (Signed)
[facility] accepted [facility] per [facility]. Dr. Guss Bundehalla accepting.  Call report to [phone].  [facility] asked that Nurse to Nurse report be done only when transport is there to take him.  Pt to be IVC'd prior to transport.   [facility] Nurse, Leeroy Bockhelsea, notified and will ask EDP to initiate IVC process.  CSW called [facility] to inform them of IVC in process.  CSW will fax [facility] to [facility] once pt has been served.   Timmothy EulerJean T. Kaylyn LimSutter, MSW, LCSWA Clinical Social Work Disposition [phone]

## 2017-03-30 NOTE — ED Notes (Signed)
[name] aware that pt will not be transported until tomorrow morning

## 2017-03-30 NOTE — ED Notes (Signed)
Regular Diet was Ordered for Carl LeeDinner, and Patient was given snack and Drink.

## 2017-03-31 DIAGNOSIS — F329 Major depressive disorder, single episode, unspecified: Secondary | ICD-10-CM | POA: Diagnosis not present

## 2017-03-31 NOTE — ED Notes (Signed)
areas on right lower leg not draining, minimal swelling and redness.

## 2017-04-23 ENCOUNTER — Encounter (HOSPITAL_COMMUNITY): Payer: Self-pay | Admitting: Emergency Medicine

## 2017-04-23 ENCOUNTER — Emergency Department (HOSPITAL_COMMUNITY)
Admission: EM | Admit: 2017-04-23 | Discharge: 2017-04-23 | Disposition: A | Discharge status: Home / Self Care | Payer: Medicare Other | Attending: Emergency Medicine | Admitting: Emergency Medicine

## 2017-04-23 DIAGNOSIS — Z79899 Other long term (current) drug therapy: Secondary | ICD-10-CM | POA: Diagnosis not present

## 2017-04-23 DIAGNOSIS — M79604 Pain in right leg: Secondary | ICD-10-CM | POA: Diagnosis not present

## 2017-04-23 DIAGNOSIS — M545 Low back pain, unspecified: Secondary | ICD-10-CM

## 2017-04-23 DIAGNOSIS — F17210 Nicotine dependence, cigarettes, uncomplicated: Secondary | ICD-10-CM | POA: Insufficient documentation

## 2017-04-23 DIAGNOSIS — R030 Elevated blood-pressure reading, without diagnosis of hypertension: Secondary | ICD-10-CM | POA: Diagnosis not present

## 2017-04-23 DIAGNOSIS — I10 Essential (primary) hypertension: Secondary | ICD-10-CM | POA: Diagnosis not present

## 2017-04-23 DIAGNOSIS — M5489 Other dorsalgia: Secondary | ICD-10-CM | POA: Diagnosis not present

## 2017-04-23 MED ORDER — IBUPROFEN 200 MG PO TABS
600.0000 mg | ORAL_TABLET | Freq: Once | ORAL | Status: AC
  Administered 2017-04-23: 600 mg via ORAL
  Filled 2017-04-23: qty 3

## 2017-04-23 MED ORDER — ACETAMINOPHEN 325 MG PO TABS
650.0000 mg | ORAL_TABLET | Freq: Once | ORAL | Status: AC
  Administered 2017-04-23: 650 mg via ORAL
  Filled 2017-04-23: qty 2

## 2017-04-23 NOTE — ED Provider Notes (Signed)
[facility]-EMERGENCY DEPT Provider Note   CSN: [id number] Arrival date & time: 04/23/17  0440     History   Chief Complaint Chief Complaint  Patient presents with  . Flank Pain    left    HPI [name] is a 41 y.o. male.  HPI Patient presents emergency department with ongoing left upper gluteal and low back pain over the past 3 weeks.  He reports no weakness in the lower extremity.  No known injury.  He also reports pain to his right knee radiating down towards his right lower leg over the past several days.  No injury or trauma.  No fevers or chills.  No redness.  He reports a long-standing history of chronic neuropathic type pain in his right lower extremity for which is on gabapentin.  He is requesting something for pain medication.  He did have a recent admission for polysubstance abuse.  His reported in the chart that he has a history of cannabis, cocaine, heroin use.  Patient denies heroin use.  He reports no midline low back pain.  No fevers or chills.  Denies abdominal pain.  No nausea or vomiting.  No other complaints at this time.  Ambulatory in the ER   Past Medical History:  Diagnosis Date  . Boxer's fracture   . Hypertension   . Schizophrenic disorder [facility](HCC)     Patient Active Problem List   Diagnosis Date Noted  . Substance induced mood disorder (HCC) 11/29/2015  . Homeless   . Suicidal ideation   . Schizoaffective disorder, unspecified type (HCC)   . Schizoaffective disorder (HCC) 05/08/2013  . Cocaine abuse 11/11/2011    History reviewed. No pertinent surgical history.     Home Medications    Prior to Admission medications   Medication Sig Start Date End Date Taking? Authorizing Provider  gabapentin (NEURONTIN) 100 MG capsule Take 2 capsules (200 mg total) by mouth 3 (three) times daily. 01/31/17   Charm RingsLord, Jamison Y, NP  gabapentin (NEURONTIN) 800 MG tablet Take 800 mg by mouth 3 (three) times daily.    [provider]  mometasone (NASONEX) 50  MCG/ACT nasal spray Place 2 sprays into the nose daily.    [provider]  OLANZapine zydis (ZYPREXA) 20 MG disintegrating tablet Take 1 tablet (20 mg total) by mouth at bedtime. 01/31/17   Charm RingsLord, Jamison Y, NP  prazosin (MINIPRESS) 2 MG capsule Take 2 mg by mouth at bedtime.    [provider]  QUEtiapine (SEROQUEL) 400 MG tablet Take 400 mg by mouth at bedtime.    [provider]  risperiDONE microspheres (RISPERDAL CONSTA) 37.5 MG injection Inject 37.5 mg into the muscle every 14 (fourteen) days.    [provider]    Family History No family history on file.  Social History Social History  Substance Use Topics  . Smoking status: Current Every Day Smoker    Packs/day: 1.00    Years: 15.00    Types: Cigarettes  . Smokeless tobacco: Never Used  . Alcohol use No     Comment: seldom     Allergies   Haldol [haloperidol decanoate]   Review of Systems Review of Systems  All other systems reviewed and are negative.    Physical Exam Updated Vital Signs BP 139/88   Pulse (!) 109   Resp 18   SpO2 95%   Physical Exam  Constitutional: He is oriented to person, place, and time. He appears well-developed and well-nourished.  HENT:  Head:  Normocephalic.  Eyes: EOM are normal.  Neck: Normal range of motion.  Pulmonary/Chest: Effort normal.  Abdominal: He exhibits no distension.  Musculoskeletal: Normal range of motion.  No thoracic or lumbar tenderness.  Mild low paralumbar tenderness without spasm.  Normal PT and DP pulses bilaterally.  Full range of motion bilateral hips, knees, ankles.  Right lower extremity without erythema or swelling as compared to left.  Neurological: He is alert and oriented to person, place, and time.  Psychiatric: He has a normal mood and affect.  Nursing note and vitals reviewed.    ED Treatments / Results  Labs (all labs ordered are listed, but only abnormal results are displayed) Labs Reviewed - No data to  display  EKG  EKG Interpretation None       Radiology No results found.  Procedures Procedures (including critical care time)  Medications Ordered in ED Medications  ibuprofen (ADVIL,MOTRIN) tablet 600 mg (not administered)  acetaminophen (TYLENOL) tablet 650 mg (not administered)     Initial Impression / Assessment and Plan / ED Course  I have reviewed the triage vital signs and the nursing notes.  Pertinent labs & imaging results that were available during my care of the patient were reviewed by me and considered in my medical decision making (see chart for details).     Patient has not tried any medication at home.  Ibuprofen and Tylenol will be given here in the ER.  No indication for labs or imaging.  Vital signs are stable.  Discharge home in good condition.  Home with instructions for anti-inflammatories.  [facility] so he can develop a relationship with a primary care physician  Final Clinical Impressions(s) / ED Diagnoses   Final diagnoses:  Right leg pain  Acute left-sided low back pain without sciatica    New Prescriptions New Prescriptions   No medications on file     Azalia Bilisampos, Talmadge Ganas, MD 04/23/17 [phone]

## 2017-04-23 NOTE — ED Triage Notes (Signed)
Pt arriving via EMS pt complaining of left flank/back pain that began 3 weeks ago. Right leg pain that began a couple days ago. Pt A&O x 4. Pt able to walk into building from EMS truck.   142/102 HR 86 Resp 16

## 2017-04-23 NOTE — ED Notes (Signed)
Bed: [id number] Expected date:  Expected time:  Means of arrival:  Comments: EMS 41 yo male left flank pain/right leg pain

## 2017-05-31 ENCOUNTER — Encounter (HOSPITAL_COMMUNITY): Payer: Self-pay | Admitting: Emergency Medicine

## 2017-05-31 ENCOUNTER — Emergency Department (HOSPITAL_COMMUNITY)
Admission: EM | Admit: 2017-05-31 | Discharge: 2017-05-31 | Disposition: A | Discharge status: Home / Self Care | Payer: Medicare Other | Attending: Emergency Medicine | Admitting: Emergency Medicine

## 2017-05-31 ENCOUNTER — Inpatient Hospital Stay: Admission: AD | Admit: 2017-05-31 | Payer: Medicare Other | Source: Intra-hospital | Admitting: Psychiatry

## 2017-05-31 DIAGNOSIS — Z8659 Personal history of other mental and behavioral disorders: Secondary | ICD-10-CM | POA: Diagnosis not present

## 2017-05-31 DIAGNOSIS — R440 Auditory hallucinations: Secondary | ICD-10-CM | POA: Insufficient documentation

## 2017-05-31 DIAGNOSIS — F17210 Nicotine dependence, cigarettes, uncomplicated: Secondary | ICD-10-CM | POA: Diagnosis not present

## 2017-05-31 DIAGNOSIS — F259 Schizoaffective disorder, unspecified: Secondary | ICD-10-CM | POA: Diagnosis present

## 2017-05-31 DIAGNOSIS — Z79899 Other long term (current) drug therapy: Secondary | ICD-10-CM | POA: Insufficient documentation

## 2017-05-31 DIAGNOSIS — F251 Schizoaffective disorder, depressive type: Secondary | ICD-10-CM | POA: Diagnosis present

## 2017-05-31 DIAGNOSIS — F1410 Cocaine abuse, uncomplicated: Secondary | ICD-10-CM | POA: Diagnosis present

## 2017-05-31 LAB — COMPREHENSIVE METABOLIC PANEL
ALBUMIN: 3.9 g/dL (ref 3.5–5.0)
ALT: 16 U/L — ABNORMAL LOW (ref 17–63)
ANION GAP: 8 (ref 5–15)
AST: 16 U/L (ref 15–41)
Alkaline Phosphatase: 99 U/L (ref 38–126)
BUN: 8 mg/dL (ref 6–20)
CHLORIDE: 102 mmol/L (ref 101–111)
CO2: 26 mmol/L (ref 22–32)
Calcium: 9.1 mg/dL (ref 8.9–10.3)
Creatinine, Ser: 0.91 mg/dL (ref 0.61–1.24)
GFR calc Af Amer: >60 mL/min (ref >60)
GFR calc non Af Amer: >60 mL/min (ref >60)
GLUCOSE: 98 mg/dL (ref 65–99)
POTASSIUM: 3.5 mmol/L (ref 3.5–5.1)
SODIUM: 136 mmol/L (ref 135–145)
TOTAL PROTEIN: 6.8 g/dL (ref 6.5–8.1)
Total Bilirubin: 1 mg/dL (ref 0.3–1.2)

## 2017-05-31 LAB — CBC WITH DIFFERENTIAL/PLATELET
BASOS ABS: 0.1 10*3/uL (ref 0.0–0.1)
BASOS PCT: 1 %
EOS ABS: 0.3 10*3/uL (ref 0.0–0.7)
Eosinophils Relative: 4 %
HEMATOCRIT: 41.1 % (ref 39.0–52.0)
Hemoglobin: 14.6 g/dL (ref 13.0–17.0)
Lymphocytes Relative: 32 %
Lymphs Abs: 2.8 10*3/uL (ref 0.7–4.0)
MCH: 31.7 pg (ref 26.0–34.0)
MCHC: 35.5 g/dL (ref 30.0–36.0)
MCV: 89.3 fL (ref 78.0–100.0)
MONO ABS: 0.4 10*3/uL (ref 0.1–1.0)
MONOS PCT: 5 %
NEUTROS ABS: 5.3 10*3/uL (ref 1.7–7.7)
Neutrophils Relative %: 60 %
PLATELETS: 192 10*3/uL (ref 150–400)
RBC: 4.6 MIL/uL (ref 4.22–5.81)
RDW: 12.8 % (ref 11.5–15.5)
WBC: 9 10*3/uL (ref 4.0–10.5)

## 2017-05-31 LAB — RAPID URINE DRUG SCREEN, HOSP PERFORMED
AMPHETAMINES: NOT DETECTED
BARBITURATES: NOT DETECTED
BENZODIAZEPINES: NOT DETECTED
COCAINE: POSITIVE — AB
Opiates: NOT DETECTED
TETRAHYDROCANNABINOL: NOT DETECTED

## 2017-05-31 LAB — URINALYSIS, ROUTINE W REFLEX MICROSCOPIC
BILIRUBIN URINE: NEGATIVE
GLUCOSE, UA: NEGATIVE mg/dL
Hgb urine dipstick: NEGATIVE
Ketones, ur: NEGATIVE mg/dL
Leukocytes, UA: NEGATIVE
Nitrite: NEGATIVE
PROTEIN: NEGATIVE mg/dL
Specific Gravity, Urine: 1.013 (ref 1.005–1.030)
pH: 5 (ref 5.0–8.0)

## 2017-05-31 LAB — ETHANOL: Alcohol, Ethyl (B): <5 mg/dL (ref <5)

## 2017-05-31 MED ORDER — NICOTINE 21 MG/24HR TD PT24: 21.0000 mg | MEDICATED_PATCH | Freq: Every day | TRANSDERMAL | Status: DC

## 2017-05-31 MED ORDER — RISPERIDONE 0.5 MG PO TABS
0.5000 mg | ORAL_TABLET | Freq: Two times a day (BID) | ORAL | Status: DC
  Administered 2017-05-31: 0.5 mg via ORAL
  Filled 2017-05-31: qty 1

## 2017-05-31 MED ORDER — GABAPENTIN 100 MG PO CAPS: 200.0000 mg | ORAL_CAPSULE | Freq: Three times a day (TID) | ORAL | Status: DC

## 2017-05-31 MED ORDER — LORAZEPAM 2 MG/ML IJ SOLN: 0.0000 mg | Freq: Two times a day (BID) | INTRAMUSCULAR | Status: DC

## 2017-05-31 MED ORDER — ACETAMINOPHEN 325 MG PO TABS: 650.0000 mg | ORAL_TABLET | ORAL | Status: DC | PRN

## 2017-05-31 MED ORDER — LORAZEPAM 1 MG PO TABS: 0.0000 mg | ORAL_TABLET | Freq: Four times a day (QID) | ORAL | Status: DC

## 2017-05-31 MED ORDER — RISPERIDONE MICROSPHERES 37.5 MG IM SUSR
37.5000 mg | INTRAMUSCULAR | Status: DC
  Administered 2017-05-31: 37.5 mg via INTRAMUSCULAR
  Filled 2017-05-31: qty 2

## 2017-05-31 MED ORDER — HYDROXYZINE HCL 25 MG PO TABS
25.0000 mg | ORAL_TABLET | Freq: Two times a day (BID) | ORAL | Status: DC
  Administered 2017-05-31: 25 mg via ORAL
  Filled 2017-05-31: qty 1

## 2017-05-31 MED ORDER — GABAPENTIN 400 MG PO CAPS
800.0000 mg | ORAL_CAPSULE | Freq: Three times a day (TID) | ORAL | Status: DC
  Administered 2017-05-31: 800 mg via ORAL
  Filled 2017-05-31: qty 2

## 2017-05-31 MED ORDER — OLANZAPINE 10 MG PO TBDP: 20.0000 mg | ORAL_TABLET | Freq: Every day | ORAL | Status: DC

## 2017-05-31 MED ORDER — VITAMIN B-1 100 MG PO TABS
100.0000 mg | ORAL_TABLET | Freq: Every day | ORAL | Status: DC
  Administered 2017-05-31: 100 mg via ORAL
  Filled 2017-05-31: qty 1

## 2017-05-31 MED ORDER — GABAPENTIN 300 MG PO CAPS
300.0000 mg | ORAL_CAPSULE | Freq: Three times a day (TID) | ORAL | Status: DC
  Administered 2017-05-31: 300 mg via ORAL
  Filled 2017-05-31: qty 1

## 2017-05-31 MED ORDER — THIAMINE HCL 100 MG/ML IJ SOLN: 100.0000 mg | Freq: Every day | INTRAMUSCULAR | Status: DC

## 2017-05-31 MED ORDER — LORAZEPAM 1 MG PO TABS: 0.0000 mg | ORAL_TABLET | Freq: Two times a day (BID) | ORAL | Status: DC

## 2017-05-31 MED ORDER — RISPERIDONE MICROSPHERES 37.5 MG IM SUSR: 37.5000 mg | INTRAMUSCULAR | Status: DC

## 2017-05-31 MED ORDER — LORAZEPAM 2 MG/ML IJ SOLN
0.0000 mg | Freq: Four times a day (QID) | INTRAMUSCULAR | Status: DC
Start: 2017-05-31 — End: 2017-05-31

## 2017-05-31 NOTE — ED Notes (Signed)
Introduced self to patient.  Pt oriented to unit expectations.  Assessed pt for:  A) Anxiety &/or agitation: On admission to the [facility] pt has a flat affect and appears to be depressed. He reports thoughts of killing himself, but not other people. He reports hearing voices, but does not appear to be responding to internal stimuli.  S) Safety: Safety maintained with q-15-minute checks and hourly rounds by staff.  A) ADLs: Pt able to perform ADLs independently.  P) Pick-Up (room cleanliness): Pt's room clean and free of clutter.

## 2017-05-31 NOTE — ED Provider Notes (Signed)
I was asked by nursing staff to place psych hold orders. Awaiting TTS recommendations and disposition at this time. Patient's lab work was reviewed by myself.patient was seen by prior provider. Patient with history of paranoid schizophrenia. States he is hearing voices telling him to harm himself. States that he is not receiving his medication injections.  Medical screening labs were reviewed and are reassuring. Patient denies any medical complaints at this time. Patient is tolerating food and ambulate normally by himself without any difficulties. Patient has been verbally aggressive with nursing staff. Patient was able to be redirected. Reviewed prior providers note. The patient can be medically cleared for TTS evaluation and disposition. Psych hold orders were placed with CIWA.    Rise MuLeaphart, Solenne Manwarren T, PA-C 05/31/17 1323    Shaune PollackIsaacs, Cameron, MD 06/02/17 1120

## 2017-05-31 NOTE — ED Notes (Signed)
EDPA TYLER Provider at bedside. 
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
## 2017-05-31 NOTE — BH Assessment (Signed)
[facility] Assessment Progress Note  Per Thedore MinsMojeed Akintayo, MD, this pt requires psychiatric hospitalization at this time.  The following facilities have been contacted to seek placement for this pt, with results as noted:  Beds available, information sent, decision pending:  [facility]   At [facility]:  [facility] [address]   Rayvin Abid, [place] Triage Specialist [phone]

## 2017-05-31 NOTE — BH Assessment (Signed)
Patient has been accepted [facility].  Accepting physician is Dr. Jennet MaduroPucilowska.  Attending Physician will be Dr. Jennet MaduroPucilowska.  Patient has been [facility] to room 322, [facility] Nurse Edwena BundeJanet J.  Call report to [phone].  Representative/Transfer Coordinator is Warden/rangerCalvin Patient pre-admitted by [facility] Access Ola Spurr(Janielle)   [facility] Staff ([place] P, TTS) made aware of acceptance.

## 2017-05-31 NOTE — ED Notes (Signed)
Patient became really agitated after speaking to the nurse. The presences of security seemed to calm him down. Patient just wants to sleep and not be bothered but it was explained to him that this is the hospital and he will get bothered

## 2017-05-31 NOTE — ED Notes (Signed)
Tried to call report to [facility], and there is not a bed available on [facility] at this time. French Anaracy with [facility] is talking with their nursing supervisor to determine another bed for him.

## 2017-05-31 NOTE — ED Provider Notes (Signed)
[facility]-EMERGENCY DEPT Provider Note   CSN: [id number] Arrival date & time: 05/31/17  0410     History   Chief Complaint No chief complaint on file.   HPI [name] is a 41 y.o. male.  Patient is a 41 year old male with past medical history of paranoid schizophrenia. He presents for evaluation of hearing voices telling him to harm himself. He reports that he "ain't doing so good" and that he is "unable to function in society". He normally receives Risperdal microspheres, however has not had this medication in 2 months. He denies any fevers or chills. He denies any drug or alcohol consumption.   The history is provided by the patient.    Past Medical History:  Diagnosis Date  . Boxer's fracture   . Hypertension   . Schizophrenic disorder [facility](HCC)     Patient Active Problem List   Diagnosis Date Noted  . Substance induced mood disorder (HCC) 11/29/2015  . Homeless   . Suicidal ideation   . Schizoaffective disorder, unspecified type (HCC)   . Schizoaffective disorder (HCC) 05/08/2013  . Cocaine abuse 11/11/2011    History reviewed. No pertinent surgical history.     Home Medications    Prior to Admission medications   Medication Sig Start Date End Date Taking? Authorizing Provider  gabapentin (NEURONTIN) 100 MG capsule Take 2 capsules (200 mg total) by mouth 3 (three) times daily. 01/31/17   Charm RingsLord, Jamison Y, NP  gabapentin (NEURONTIN) 800 MG tablet Take 800 mg by mouth 3 (three) times daily.    [provider]  mometasone (NASONEX) 50 MCG/ACT nasal spray Place 2 sprays into the nose daily.    [provider]  OLANZapine zydis (ZYPREXA) 20 MG disintegrating tablet Take 1 tablet (20 mg total) by mouth at bedtime. 01/31/17   Charm RingsLord, Jamison Y, NP  prazosin (MINIPRESS) 2 MG capsule Take 2 mg by mouth at bedtime.    [provider]  risperiDONE microspheres (RISPERDAL CONSTA) 37.5 MG injection Inject 37.5 mg into the muscle every 14 (fourteen) days.     [provider]    Family History History reviewed. No pertinent family history.  Social History Social History  Substance Use Topics  . Smoking status: Current Every Day Smoker    Packs/day: 1.00    Years: 15.00    Types: Cigarettes  . Smokeless tobacco: Never Used  . Alcohol use No     Comment: seldom     Allergies   Haldol [haloperidol decanoate]   Review of Systems Review of Systems  All other systems reviewed and are negative.    Physical Exam Updated Vital Signs BP 120/64 (BP Location: Left Arm)   Pulse 98   Temp 97.6 F (36.4 C) (Oral)   Resp 19   SpO2 98%   Physical Exam  Constitutional: He is oriented to person, place, and time. He appears well-developed and well-nourished. No distress.  HENT:  Head: Normocephalic and atraumatic.  Mouth/Throat: Oropharynx is clear and moist.  Neck: Normal range of motion. Neck supple.  Cardiovascular: Normal rate and regular rhythm.  Exam reveals no friction rub.   No murmur heard. Pulmonary/Chest: Effort normal and breath sounds normal. No respiratory distress. He has no wheezes. He has no rales.  Abdominal: Soft. Bowel sounds are normal. He exhibits no distension. There is no tenderness.  Musculoskeletal: Normal range of motion. He exhibits no edema.  Neurological: He is alert and oriented to person, place, and time. Coordination normal.  Skin: Skin is  warm and dry. He is not diaphoretic.  Psychiatric: His speech is normal and behavior is normal. His mood appears not anxious. His affect is blunt. He is actively hallucinating. Thought content is paranoid. Cognition and memory are normal. He expresses impulsivity. He expresses suicidal ideation.  Nursing note and vitals reviewed.    ED Treatments / Results  Labs (all labs ordered are listed, but only abnormal results are displayed) Labs Reviewed  CBC WITH DIFFERENTIAL/PLATELET  COMPREHENSIVE METABOLIC PANEL  ETHANOL  RAPID URINE DRUG SCREEN, HOSP  PERFORMED  URINALYSIS, ROUTINE W REFLEX MICROSCOPIC    EKG  EKG Interpretation None       Radiology No results found.  Procedures Procedures (including critical care time)  Medications Ordered in ED Medications - No data to display   Initial Impression / Assessment and Plan / ED Course  I have reviewed the triage vital signs and the nursing notes.  Pertinent labs & imaging results that were available during my care of the patient were reviewed by me and considered in my medical decision making (see chart for details).  Patient complaining of auditory hallucinations telling him to harm himself. He has not had his psychiatric medication injection in many weeks. At the recommendation of TTS, he will be remaining in the ED until morning when he can undergo formal psychiatric consultation.  Final Clinical Impressions(s) / ED Diagnoses   Final diagnoses:  None    New Prescriptions New Prescriptions   No medications on file     Geoffery Lyons, MD 05/31/17 [phone]

## 2017-05-31 NOTE — ED Notes (Signed)
TTS Provider at bedside. 

## 2017-05-31 NOTE — ED Notes (Addendum)
Pt in bed resting  

## 2017-05-31 NOTE — Progress Notes (Signed)
05/31/17 1348:  LRT went to pt room to offer activities, pt was asleep.   Jatavius Ellenwood, LRT/CTRS  
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
## 2017-05-31 NOTE — ED Notes (Signed)
Pt transported voluntarily to [facility] by [facility]. All belongings returned to pt.

## 2017-05-31 NOTE — Progress Notes (Signed)
CSW received a call from [facility] Pt has been accepted by: [facility] Number for report is: [phone] Pt's unit/room/bed number will be: [facility] Accepting physician: Dr. Betti Cruzeddy  Pt can arrive ASAP on 05/31/17  Kennith Centerracey asks that since the pt is voluntary to please insure pt is "willing" to D/C to [facility] for treatment.  CSW will update RN and EDP.  Dorothe PeaJonathan F. Lebaron Bautch, Francesco SorLCSWA, LCAS, CSI Clinical Social Worker Ph: [phone]

## 2017-05-31 NOTE — ED Triage Notes (Signed)
Patient states he is here to get his psych med injection-states he has not had his injection in 2 months and now he is hearing voices

## 2017-05-31 NOTE — ED Notes (Signed)
Pt now has a bed at [facility] [address] instead of [facility].

## 2017-06-01 DIAGNOSIS — F209 Schizophrenia, unspecified: Secondary | ICD-10-CM | POA: Diagnosis not present

## 2017-06-02 DIAGNOSIS — F209 Schizophrenia, unspecified: Secondary | ICD-10-CM | POA: Diagnosis not present

## 2017-06-06 DIAGNOSIS — F209 Schizophrenia, unspecified: Secondary | ICD-10-CM | POA: Diagnosis not present

## 2017-06-16 DIAGNOSIS — F29 Unspecified psychosis not due to a substance or known physiological condition: Secondary | ICD-10-CM | POA: Diagnosis not present

## 2017-06-29 ENCOUNTER — Emergency Department (HOSPITAL_COMMUNITY)
Admission: EM | Admit: 2017-06-29 | Discharge: 2017-06-30 | Disposition: A | Discharge status: Home / Self Care | Payer: Medicare Other | Attending: Emergency Medicine | Admitting: Emergency Medicine

## 2017-06-29 ENCOUNTER — Encounter (HOSPITAL_COMMUNITY): Payer: Self-pay | Admitting: *Deleted

## 2017-06-29 DIAGNOSIS — F1414 Cocaine abuse with cocaine-induced mood disorder: Secondary | ICD-10-CM | POA: Diagnosis present

## 2017-06-29 DIAGNOSIS — F259 Schizoaffective disorder, unspecified: Secondary | ICD-10-CM | POA: Insufficient documentation

## 2017-06-29 DIAGNOSIS — R45851 Suicidal ideations: Secondary | ICD-10-CM | POA: Diagnosis not present

## 2017-06-29 DIAGNOSIS — F1910 Other psychoactive substance abuse, uncomplicated: Secondary | ICD-10-CM | POA: Insufficient documentation

## 2017-06-29 DIAGNOSIS — F17210 Nicotine dependence, cigarettes, uncomplicated: Secondary | ICD-10-CM | POA: Insufficient documentation

## 2017-06-29 DIAGNOSIS — I10 Essential (primary) hypertension: Secondary | ICD-10-CM | POA: Diagnosis not present

## 2017-06-29 DIAGNOSIS — Z79899 Other long term (current) drug therapy: Secondary | ICD-10-CM | POA: Diagnosis not present

## 2017-06-29 DIAGNOSIS — F251 Schizoaffective disorder, depressive type: Secondary | ICD-10-CM | POA: Diagnosis present

## 2017-06-29 LAB — RAPID URINE DRUG SCREEN, HOSP PERFORMED
AMPHETAMINES: NOT DETECTED
Barbiturates: NOT DETECTED
Benzodiazepines: NOT DETECTED
COCAINE: POSITIVE — AB
OPIATES: NOT DETECTED
Tetrahydrocannabinol: NOT DETECTED

## 2017-06-29 LAB — COMPREHENSIVE METABOLIC PANEL
ALBUMIN: 3.7 g/dL (ref 3.5–5.0)
ALK PHOS: 120 U/L (ref 38–126)
ALT: 15 U/L — ABNORMAL LOW (ref 17–63)
AST: 18 U/L (ref 15–41)
Anion gap: 9 (ref 5–15)
BILIRUBIN TOTAL: 1.2 mg/dL (ref 0.3–1.2)
BUN: 6 mg/dL (ref 6–20)
CALCIUM: 8.9 mg/dL (ref 8.9–10.3)
CO2: 27 mmol/L (ref 22–32)
Chloride: 104 mmol/L (ref 101–111)
Creatinine, Ser: 0.83 mg/dL (ref 0.61–1.24)
GFR calc Af Amer: >60 mL/min (ref >60)
GLUCOSE: 125 mg/dL — AB (ref 65–99)
POTASSIUM: 3 mmol/L — AB (ref 3.5–5.1)
Sodium: 140 mmol/L (ref 135–145)
TOTAL PROTEIN: 6.7 g/dL (ref 6.5–8.1)

## 2017-06-29 LAB — SALICYLATE LEVEL: Salicylate Lvl: <7 mg/dL (ref 2.8–30.0)

## 2017-06-29 LAB — CBC
HCT: 39.4 % (ref 39.0–52.0)
Hemoglobin: 13.7 g/dL (ref 13.0–17.0)
MCH: 31.8 pg (ref 26.0–34.0)
MCHC: 34.8 g/dL (ref 30.0–36.0)
MCV: 91.4 fL (ref 78.0–100.0)
PLATELETS: 191 10*3/uL (ref 150–400)
RBC: 4.31 MIL/uL (ref 4.22–5.81)
RDW: 13.4 % (ref 11.5–15.5)
WBC: 9.5 10*3/uL (ref 4.0–10.5)

## 2017-06-29 LAB — ACETAMINOPHEN LEVEL: Acetaminophen (Tylenol), Serum: <10 ug/mL — ABNORMAL LOW (ref 10–30)

## 2017-06-29 LAB — ETHANOL

## 2017-06-29 NOTE — ED Notes (Signed)
Pt not arrived in room.

## 2017-06-29 NOTE — ED Triage Notes (Signed)
Pt stated "I was told [name] to go to the hospital.  She told me I could get back into a program but I needed to come here.  I did crack yesterday.  I do crack every day but I've been too depressed today."

## 2017-06-29 NOTE — ED Notes (Signed)
Bed: ZO10 Expected date:  Expected time:  Means of arrival:  Comments: [employer] SI/40 yo
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
## 2017-06-29 NOTE — ED Provider Notes (Signed)
[facility]-EMERGENCY DEPT Provider Note   CSN: [id number] Arrival date & time: 06/29/17  2008     History   Chief Complaint Chief Complaint  Patient presents with  . Suicidal    HPI [name] is a 41 y.o. male.  HPI Patient reports he's been noncompliant with his medications for 1 week. Reports she just hasn't been doing well. He is having increasing problems with hearing voices and telling him to kill himself or that he will be going to jail. He reports he also has problems with substance abuse. He does use crack regularly. He denies alcohol use. He reports he is not homeless currently and he does have a follow-up plan for detox. He reports he is in the emergency department because he just feels like he is not doing well and is having increasing suicidal thoughts. He reports he just fell onto his knees several days ago. He reports they hurt  and he would like to have them checked Past Medical History:  Diagnosis Date  . Boxer's fracture   . Hypertension   . Schizophrenic disorder [facility])     Patient Active Problem List   Diagnosis Date Noted  . Substance induced mood disorder (HCC) 11/29/2015  . Homeless   . Suicidal ideation   . Schizoaffective disorder, unspecified type (HCC)   . Schizoaffective disorder (HCC) 05/08/2013  . Cocaine abuse (HCC) 11/11/2011    History reviewed. No pertinent surgical history.     Home Medications    Prior to Admission medications   Medication Sig Start Date End Date Taking? Authorizing Provider  gabapentin (NEURONTIN) 800 MG tablet Take 800 mg by mouth 3 (three) times daily.   Yes [provider]  OLANZapine zydis (ZYPREXA) 20 MG disintegrating tablet Take 1 tablet (20 mg total) by mouth at bedtime. 01/31/17  Yes Charm Rings, NP  risperiDONE microspheres (RISPERDAL CONSTA) 37.5 MG injection Inject 37.5 mg into the muscle every 14 (fourteen) days.   Yes [provider]  gabapentin (NEURONTIN) 100 MG capsule Take 2  capsules (200 mg total) by mouth 3 (three) times daily. Patient not taking: Reported on 05/31/2017 01/31/17   Charm Rings, NP    Family History No family history on file.  Social History Social History  Substance Use Topics  . Smoking status: Current Every Day Smoker    Packs/day: 1.00    Years: 15.00    Types: Cigarettes  . Smokeless tobacco: Never Used  . Alcohol use No     Comment: seldom     Allergies   Haldol [haloperidol decanoate]   Review of Systems Review of Systems 10 Systems reviewed and are negative for acute change except as noted in the HPI.   Physical Exam Updated Vital Signs BP 118/60 (BP Location: Right Arm)   Pulse 97   Temp (!) 97.5 F (36.4 C) (Oral)   Resp 20   Ht 6' (1.829 m)   Wt 113.4 kg (250 lb)   SpO2 99%   BMI 33.91 kg/m   Physical Exam  Constitutional: He is oriented to person, place, and time. He appears well-developed and well-nourished. No distress.  HENT:  Head: Normocephalic and atraumatic.  Nose: Nose normal.  Mouth/Throat: Oropharynx is clear and moist.  Eyes: Conjunctivae and EOM are normal.  Neck: Neck supple.  Cardiovascular: Normal rate, regular rhythm, normal heart sounds and intact distal pulses.   No murmur heard. Pulmonary/Chest: Effort normal and breath sounds normal. No respiratory distress.  Abdominal: Soft. He  exhibits no distension. There is no tenderness. There is no guarding.  Musculoskeletal: He exhibits no edema, tenderness or deformity.  Patient has very superficial healing old abrasions to the knees bilaterally. Neither has erythema or swelling associated. Uncomplicated findings. No lower extremity edema. In condition otherwise good.  Neurological: He is alert and oriented to person, place, and time. No cranial nerve deficit. Coordination normal.  Skin: Skin is warm and dry.  Psychiatric: He has a normal mood and affect.  Nursing note and vitals reviewed.    ED Treatments / Results  Labs (all labs  ordered are listed, but only abnormal results are displayed) Labs Reviewed  COMPREHENSIVE METABOLIC PANEL - Abnormal; Notable for the following:       Result Value   Potassium 3.0 (*)    Glucose, Bld 125 (*)    ALT 15 (*)    All other components within normal limits  ACETAMINOPHEN LEVEL - Abnormal; Notable for the following:    Acetaminophen (Tylenol), Serum <10 (*)    All other components within normal limits  RAPID URINE DRUG SCREEN, HOSP PERFORMED - Abnormal; Notable for the following:    Cocaine POSITIVE (*)    All other components within normal limits  ETHANOL  SALICYLATE LEVEL  CBC    EKG  EKG Interpretation None       Radiology No results found.  Procedures Procedures (including critical care time)  Medications Ordered in ED Medications  ibuprofen (ADVIL,MOTRIN) tablet 600 mg (not administered)  alum & mag hydroxide-simeth (MAALOX/MYLANTA) 200-200-20 MG/5ML suspension 30 mL (not administered)  gabapentin (NEURONTIN) tablet 800 mg (not administered)  OLANZapine zydis (ZYPREXA) disintegrating tablet 20 mg (not administered)  potassium chloride SA (K-DUR,KLOR-CON) CR tablet 20 mEq (not administered)     Initial Impression / Assessment and Plan / ED Course  I have reviewed the triage vital signs and the nursing notes.  Pertinent labs & imaging results that were available during my care of the patient were reviewed by me and considered in my medical decision making (see chart for details).     Final Clinical Impressions(s) / ED Diagnoses   Final diagnoses:  Suicidal ideation  Polysubstance abuse [facility])   Patient medically cleared for psychiatric evaluation. New Prescriptions New Prescriptions   No medications on file     Arby Barrette, MD 06/30/17 [phone]

## 2017-06-29 NOTE — ED Notes (Addendum)
Pt has not arrived to room.  
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
## 2017-06-30 DIAGNOSIS — F259 Schizoaffective disorder, unspecified: Secondary | ICD-10-CM | POA: Diagnosis not present

## 2017-06-30 DIAGNOSIS — F1414 Cocaine abuse with cocaine-induced mood disorder: Secondary | ICD-10-CM | POA: Diagnosis present

## 2017-06-30 MED ORDER — POTASSIUM CHLORIDE CRYS ER 20 MEQ PO TBCR
20.0000 meq | EXTENDED_RELEASE_TABLET | Freq: Every day | ORAL | Status: DC
  Administered 2017-06-30: 20 meq via ORAL
  Filled 2017-06-30: qty 1

## 2017-06-30 MED ORDER — GABAPENTIN 400 MG PO CAPS
800.0000 mg | ORAL_CAPSULE | Freq: Three times a day (TID) | ORAL | Status: DC
  Administered 2017-06-30: 800 mg via ORAL
  Filled 2017-06-30: qty 2

## 2017-06-30 MED ORDER — ALUM & MAG HYDROXIDE-SIMETH 200-200-20 MG/5ML PO SUSP: 30.0000 mL | Freq: Four times a day (QID) | ORAL | Status: DC | PRN

## 2017-06-30 MED ORDER — IBUPROFEN 200 MG PO TABS: 600.0000 mg | ORAL_TABLET | Freq: Three times a day (TID) | ORAL | Status: DC | PRN

## 2017-06-30 MED ORDER — OLANZAPINE 10 MG PO TBDP
20.0000 mg | ORAL_TABLET | Freq: Every day | ORAL | Status: DC
  Administered 2017-06-30: 20 mg via ORAL
  Filled 2017-06-30: qty 2
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
## 2017-06-30 NOTE — [facility] Suicide Risk Assessment (Signed)
Suicide Risk Assessment  Discharge Assessment   [facility] Discharge Suicide Risk Assessment   Principal Problem: Cocaine abuse with cocaine-induced mood disorder [facility]) Discharge Diagnoses:  Patient Active Problem List   Diagnosis Date Noted  . Cocaine abuse with cocaine-induced mood disorder (HCC) [F14.14] 06/30/2017    Priority: High  . Schizoaffective disorder, unspecified type (HCC) [F25.9]     Priority: High  . Substance induced mood disorder (HCC) [F19.94] 11/29/2015  . Homeless [Z59.0]   . Suicidal ideation [R45.851]   . Schizoaffective disorder (HCC) [F25.9] 05/08/2013    Total Time spent with patient: 45 minutes  Musculoskeletal: Strength & Muscle Tone: within normal limits Gait & Station: normal Patient leans: N/A  Psychiatric Specialty Exam:   Blood pressure 115/65, pulse 90, temperature 98.6 F (37 C), temperature source Oral, resp. rate 16, height 6' (1.829 m), weight 113.4 kg (250 lb), SpO2 100 %.Body mass index is 33.91 kg/m.  General Appearance: Casual  Eye Contact::  Good  Speech:  Normal Rate409  Volume:  Normal  Mood:  Euthymic  Affect:  Congruent  Thought Process:  Coherent and Descriptions of Associations: Intact  Orientation:  Full (Time, Place, and Person)  Thought Content:  WDL and Logical  Suicidal Thoughts:  No  Homicidal Thoughts:  No  Memory:  Immediate;   Good Recent;   Good Remote;   Good  Judgement:  Fair  Insight:  Fair  Psychomotor Activity:  Normal  Concentration:  Good  Recall:  Good  Fund of Knowledge:Fair  Language: Good  Akathisia:  No  Handed:  Right  AIMS (if indicated):     Assets:  Leisure Time Physical Health Resilience  Sleep:     Cognition: WNL  ADL's:  Intact   Mental Status Per Nursing Assessment::   On Admission:   41 yo male who came to the ED after using cocaine and having depression.  He came to the ED to get back into a program but was sent here first.  No suicidal/homicidal ideations, hallucinations, or  withdrawal symptoms.  Peer support referral, stable for discharge.  Demographic Factors:  Male  Loss Factors: NA  Historical Factors: NA  Risk Reduction Factors:   Sense of responsibility to family and Positive social support  Continued Clinical Symptoms:  None  Cognitive Features That Contribute To Risk:  None    Suicide Risk:  Minimal: No identifiable suicidal ideation.  Patients presenting with no risk factors but with morbid ruminations; may be classified as minimal risk based on the severity of the depressive symptoms    Plan Of Care/Follow-up recommendations:  Activity:  as tolerated Diet:  heart healthy diet  LORD, JAMISON, NP 06/30/2017, 9:49 AM

## 2017-06-30 NOTE — BH Assessment (Signed)
[facility] Assessment Progress Note  Per Thedore Mins, MD, this pt does not require psychiatric hospitalization at this time.  Pt is to be discharged from [facility] with referral information for area substance abuse treatment providers.  This has been included in pt's discharge instructions.  Pt would also benefit from seeing a Peer Support Specialist; they will be asked to speak with pt.  Pt's nurse, Morrie Sheldon, has been notified.  Doylene Canning, MA Triage Specialist [phone]

## 2017-06-30 NOTE — BH Assessment (Signed)
[facility] Assessment Progress Note  Case discussed with Donell Sievert, PA. Pt is suspected to be malingering at this time and may have secondary gain from being admitted to the hospital. Pt is recommended for continued observation overnight with a plan to be D/C in AM upon further observation. TTS attempted to contact EDP to advise of disposition but was placed on hold for several minutes and no one ever came to the phone. Pt's nurse Marylou Flesher, RN notified of recommendation.  Princess Bruins, MSW, LCSW Therapeutic Triage Specialist  [phone]

## 2017-06-30 NOTE — ED Notes (Signed)
Pt d/c home per MD order. Discharge summary reviewed with pt. Pt verbalizes understanding. Pt denies SI/HI. Bus pass provided per pt request. Pt signed for personal property and property returned. Pt signed e-signature. Ambulatory off unit with [facility].

## 2017-06-30 NOTE — ED Notes (Signed)
Patient reports SI with no plan at this and reports AH telling him to kill himself. Patient denies HI/VH. Plan of care discussed. Encouragement and support provided and safety maintain. Q 15 min safety checks in place and video monitoring.

## 2017-06-30 NOTE — Discharge Instructions (Signed)
To help you maintain a sober lifestyle, a substance abuse treatment program may be beneficial to you.  Contact one of the following providers at your earliest opportunity to ask about enrolling in their program: ° °     [facility] °     [address] °     [place], [place] [address] °     [phone] °     New patients are seen at the walk-in clinic every Tuesday from 9:00 am - 12:00 pm ° °     [facility] °     [address] °     [place], [place] [address] °     [phone] °

## 2017-06-30 NOTE — ED Notes (Signed)
TTS assessment in progress. 

## 2017-06-30 NOTE — Patient Outreach (Signed)
ED Peer Support Specialist Patient Intake (Complete at intake & 30-60 Day Follow-up)  Name: [name]  MRN: [id number]  Age: 41 y.o.   Date of Admission: 06/30/2017  Intake: Initial Comments:      Primary Reason Admitted: Pt reports ongoing SI with a plan to walk into traffic. Pt vague and unresponsive for much of the assessment. Pt turned his back towards the assessor and closed his eyes throughout the assessment. Pt was asked what triggers led to the pt being suicidal and pt stated "I don't know." Pt reports he has been suicidal for the past 3 weeks but denies any stressors. Pt reported in triage and to the EDP that he is abusing cocaine. Pt labs positive for cocaine on arrival to ED. Pt did not disclose to this writer his hx of substance abuse and when asked, pt stated "I don't know." Pt endorses AVH but does not disclose details to Clinical research associate. Pt has hx of ED visits and inpt hospitalizations c/o Schizoaffective disorder. Pt denies a current OPT provider and denies current psych medications.    Lab values: Alcohol/ETOH:   Positive UDS? No Amphetamines: No Barbiturates: No Benzodiazepines: No Cocaine: No Opiates: No Cannabinoids: No  Demographic information: Gender: Male Ethnicity: White Marital Status: Single Insurance Status: Other (comment) Control and instrumentation engineer (Work Engineer, agricultural, Sales executive, etc.: No Lives with: Alone Living situation: House/Apartment  Reported Patient History: Patient reported health conditions: Schizophrenia Patient aware of HIV and hepatitis status: No  In past year, has patient visited ED for any reason? Yes (hearing voices)  Number of ED visits: 2  Reason(s) for visit: hearing voices   In past year, has patient been hospitalized for any reason? Yes  Number of hospitalizations: 2  Reason(s) for hospitalization: hearing voices  In past year, has patient been arrested? No  Number of arrests:    Reason(s) for arrest:     In past year, has patient been incarcerated? No  Number of incarcerations:    Reason(s) for incarceration:    In past year, has patient received medication-assisted treatment? No  In past year, patient received the following treatments: Individual therapy  In past year, has patient received any harm reduction services? No  Did this include any of the following?    In past year, has patient received care from a mental health provider for diagnosis other than SUD? No  In past year, is this first time patient has overdosed? No  Number of past overdoses: 0  In past year, is this first time patient has been hospitalized for an overdose? No  Number of hospitalizations for overdose(s): 0  Is patient currently receiving treatment for a mental health diagnosis? Yes  Patient reports experiencing difficulty participating in SUD treatment: No    Most important reason(s) for this difficulty?    Has patient received prior services for treatment? Unsure  In past, patient has received services from following agencies:    Plan of Care:  Suggested follow up at these agencies/treatment centers: ACTT ([facility])  Other information:  CPSS Arlys John and Jonny Ruiz processed wit Pt and was made aware that he has not had any services from his previous ACTT team. Pt stated that he wants to get back on ACTT services.   Arlys John Thaer Miyoshi, CPSS  06/30/2017 11:33 AM

## 2017-12-13 ENCOUNTER — Inpatient Hospital Stay (HOSPITAL_COMMUNITY)
Admission: EM | Admit: 2017-12-13 | Discharge: 2017-12-15 | DRG: 176 | Disposition: A | Discharge status: Home / Self Care | Payer: Medicare Other | Attending: Family Medicine | Admitting: Family Medicine

## 2017-12-13 ENCOUNTER — Emergency Department (HOSPITAL_COMMUNITY): Payer: Medicare Other

## 2017-12-13 ENCOUNTER — Other Ambulatory Visit: Payer: Self-pay

## 2017-12-13 ENCOUNTER — Encounter (HOSPITAL_COMMUNITY): Payer: Self-pay | Admitting: *Deleted

## 2017-12-13 DIAGNOSIS — F1410 Cocaine abuse, uncomplicated: Secondary | ICD-10-CM | POA: Diagnosis not present

## 2017-12-13 DIAGNOSIS — I10 Essential (primary) hypertension: Secondary | ICD-10-CM | POA: Diagnosis not present

## 2017-12-13 DIAGNOSIS — Z23 Encounter for immunization: Secondary | ICD-10-CM | POA: Diagnosis not present

## 2017-12-13 DIAGNOSIS — F200 Paranoid schizophrenia: Secondary | ICD-10-CM | POA: Diagnosis not present

## 2017-12-13 DIAGNOSIS — Z79899 Other long term (current) drug therapy: Secondary | ICD-10-CM | POA: Diagnosis not present

## 2017-12-13 DIAGNOSIS — J45909 Unspecified asthma, uncomplicated: Secondary | ICD-10-CM | POA: Diagnosis present

## 2017-12-13 DIAGNOSIS — R079 Chest pain, unspecified: Secondary | ICD-10-CM | POA: Diagnosis not present

## 2017-12-13 DIAGNOSIS — R0602 Shortness of breath: Secondary | ICD-10-CM | POA: Diagnosis not present

## 2017-12-13 DIAGNOSIS — F17210 Nicotine dependence, cigarettes, uncomplicated: Secondary | ICD-10-CM | POA: Diagnosis present

## 2017-12-13 DIAGNOSIS — I2699 Other pulmonary embolism without acute cor pulmonale: Secondary | ICD-10-CM | POA: Diagnosis not present

## 2017-12-13 LAB — LIPID PANEL
CHOL/HDL RATIO: 5.4 ratio
Cholesterol: 198 mg/dL (ref 0–200)
HDL: 37 mg/dL — ABNORMAL LOW (ref >40)
LDL Cholesterol: 135 mg/dL — ABNORMAL HIGH (ref 0–99)
Triglycerides: 132 mg/dL (ref <150)
VLDL: 26 mg/dL (ref 0–40)

## 2017-12-13 LAB — TROPONIN I: Troponin I: <0.03 ng/mL (ref <0.03)

## 2017-12-13 LAB — CBC
HCT: 40.2 % (ref 39.0–52.0)
Hemoglobin: 13.8 g/dL (ref 13.0–17.0)
MCH: 32.2 pg (ref 26.0–34.0)
MCHC: 34.3 g/dL (ref 30.0–36.0)
MCV: 93.9 fL (ref 78.0–100.0)
PLATELETS: 212 10*3/uL (ref 150–400)
RBC: 4.28 MIL/uL (ref 4.22–5.81)
RDW: 13.6 % (ref 11.5–15.5)
WBC: 8.9 10*3/uL (ref 4.0–10.5)

## 2017-12-13 LAB — BASIC METABOLIC PANEL
Anion gap: 10 (ref 5–15)
BUN: 5 mg/dL — ABNORMAL LOW (ref 6–20)
CALCIUM: 8.8 mg/dL — AB (ref 8.9–10.3)
CO2: 24 mmol/L (ref 22–32)
CREATININE: 0.83 mg/dL (ref 0.61–1.24)
Chloride: 104 mmol/L (ref 101–111)
GFR calc non Af Amer: >60 mL/min (ref >60)
GLUCOSE: 100 mg/dL — AB (ref 65–99)
Potassium: 3.4 mmol/L — ABNORMAL LOW (ref 3.5–5.1)
Sodium: 138 mmol/L (ref 135–145)

## 2017-12-13 LAB — I-STAT TROPONIN, ED
TROPONIN I, POC: 0 ng/mL (ref 0.00–0.08)
Troponin i, poc: 0 ng/mL (ref 0.00–0.08)

## 2017-12-13 LAB — HEPARIN LEVEL (UNFRACTIONATED): Heparin Unfractionated: <0.1 IU/mL — ABNORMAL LOW (ref 0.30–0.70)

## 2017-12-13 LAB — BRAIN NATRIURETIC PEPTIDE: B NATRIURETIC PEPTIDE 5: 14.7 pg/mL (ref 0.0–100.0)

## 2017-12-13 LAB — TSH: TSH: 1.046 u[IU]/mL (ref 0.350–4.500)

## 2017-12-13 MED ORDER — ACETAMINOPHEN 500 MG PO TABS
1000.0000 mg | ORAL_TABLET | Freq: Once | ORAL | Status: AC
Start: 2017-12-13 — End: 2017-12-13
  Administered 2017-12-13: 1000 mg via ORAL
  Filled 2017-12-13: qty 2

## 2017-12-13 MED ORDER — INFLUENZA VAC SPLIT QUAD 0.5 ML IM SUSY
0.5000 mL | PREFILLED_SYRINGE | INTRAMUSCULAR | Status: AC
  Administered 2017-12-14: 0.5 mL via INTRAMUSCULAR
  Filled 2017-12-13: qty 0.5

## 2017-12-13 MED ORDER — HEPARIN BOLUS VIA INFUSION
4000.0000 [IU] | Freq: Once | INTRAVENOUS | Status: AC
  Administered 2017-12-13: 4000 [IU] via INTRAVENOUS
  Filled 2017-12-13: qty 4000

## 2017-12-13 MED ORDER — SODIUM CHLORIDE 0.9 % IV BOLUS (SEPSIS)
1000.0000 mL | Freq: Once | INTRAVENOUS | Status: AC
  Administered 2017-12-13: 1000 mL via INTRAVENOUS

## 2017-12-13 MED ORDER — ACETAMINOPHEN 325 MG PO TABS
650.0000 mg | ORAL_TABLET | Freq: Four times a day (QID) | ORAL | Status: DC | PRN
  Administered 2017-12-14: 650 mg via ORAL
  Filled 2017-12-13: qty 2

## 2017-12-13 MED ORDER — ONDANSETRON HCL 4 MG PO TABS: 4.0000 mg | ORAL_TABLET | Freq: Four times a day (QID) | ORAL | Status: DC | PRN

## 2017-12-13 MED ORDER — ONDANSETRON HCL 4 MG/2ML IJ SOLN: 4.0000 mg | Freq: Four times a day (QID) | INTRAMUSCULAR | Status: DC | PRN

## 2017-12-13 MED ORDER — NICOTINE 21 MG/24HR TD PT24
21.0000 mg | MEDICATED_PATCH | Freq: Every day | TRANSDERMAL | Status: DC
  Administered 2017-12-13 – 2017-12-14 (×2): 21 mg via TRANSDERMAL
  Filled 2017-12-13 (×3): qty 1

## 2017-12-13 MED ORDER — ACETAMINOPHEN 650 MG RE SUPP
650.0000 mg | Freq: Four times a day (QID) | RECTAL | Status: DC | PRN
Start: 2017-12-13 — End: 2017-12-15

## 2017-12-13 MED ORDER — ASPIRIN 81 MG PO CHEW
324.0000 mg | CHEWABLE_TABLET | Freq: Once | ORAL | Status: AC
Start: 2017-12-13 — End: 2017-12-13
  Administered 2017-12-13: 324 mg via ORAL
  Filled 2017-12-13: qty 4

## 2017-12-13 MED ORDER — IPRATROPIUM-ALBUTEROL 0.5-2.5 (3) MG/3ML IN SOLN
3.0000 mL | Freq: Once | RESPIRATORY_TRACT | Status: AC
  Administered 2017-12-13: 3 mL via RESPIRATORY_TRACT
  Filled 2017-12-13: qty 3

## 2017-12-13 MED ORDER — MELATONIN 3 MG PO TABS
1.0000 | ORAL_TABLET | Freq: Every evening | ORAL | Status: DC | PRN
  Administered 2017-12-13: 3 mg via ORAL
  Filled 2017-12-13 (×2): qty 1

## 2017-12-13 MED ORDER — NON FORMULARY: 3.0000 mg | Freq: Every evening | Status: DC | PRN

## 2017-12-13 MED ORDER — HEPARIN (PORCINE) IN NACL 100-0.45 UNIT/ML-% IJ SOLN
2350.0000 [IU]/h | INTRAMUSCULAR | Status: DC
  Administered 2017-12-13: 1650 [IU]/h via INTRAVENOUS
  Filled 2017-12-13 (×3): qty 250

## 2017-12-13 MED ORDER — HEPARIN BOLUS VIA INFUSION
5250.0000 [IU] | Freq: Once | INTRAVENOUS | Status: AC
  Administered 2017-12-13: 5250 [IU] via INTRAVENOUS
  Filled 2017-12-13: qty 5250

## 2017-12-13 MED ORDER — IOPAMIDOL (ISOVUE-370) INJECTION 76%
INTRAVENOUS | Status: AC
  Administered 2017-12-13: 100 mL
  Filled 2017-12-13: qty 100

## 2017-12-13 NOTE — ED Notes (Signed)
Pt stated that his mom passed last week on Saturday. Pt stated to cry a little and stated to complain of more pain around his heart.

## 2017-12-13 NOTE — ED Provider Notes (Signed)
[facility] EMERGENCY DEPARTMENT Provider Note   CSN: [id number] Arrival date & time: 12/13/17  0827     History   Chief Complaint Chief Complaint  Patient presents with  . Chest Pain    HPI [name] is a 42 y.o. male with history of cocaine abuse, schizophrenia, tobacco abuse is here for evaluation of left-sided chest pain that radiates to the left lateral rib cage area onset 3 PM yesterday while he was working lifting 50-100 pound boxes. Pain described as sharp and constant, feels like a needle piercing his heart.  Aggravating factors include movement and taking deep breaths. No alleviating factors. Associated with shortness of breath due to the sharp pain from breathing in. Also reports nasal congestion and green rhinorrhea. Notes that in the last 2 months he has gained 45 pounds and has also noticed bilateral ankle swelling. One episode of diarrhea several days ago, none since. Denies fevers, cough, sore throat, abdominal pain or swelling, nausea, vomiting, urinary symptoms, calf pain. Last used cocaine last night, states this did not worsen his chest pain. Denies EtOH abuse. Has been compliant with psych meds which have been unchanged in several years. No h/o CAD or DVT/PE.   HPI  Past Medical History:  Diagnosis Date  . Boxer's fracture   . Hypertension   . Schizophrenic disorder [facility])     Patient Active Problem List   Diagnosis Date Noted  . Cocaine abuse with cocaine-induced mood disorder (HCC) 06/30/2017  . Substance induced mood disorder (HCC) 11/29/2015  . Homeless   . Suicidal ideation   . Schizoaffective disorder, unspecified type (HCC)   . Schizoaffective disorder (HCC) 05/08/2013    History reviewed. No pertinent surgical history.     Home Medications    Prior to Admission medications   Medication Sig Start Date End Date Taking? Authorizing Provider  gabapentin (NEURONTIN) 800 MG tablet Take 800 mg by mouth 3 (three) times daily.    Yes [provider]  OLANZapine zydis (ZYPREXA) 20 MG disintegrating tablet Take 1 tablet (20 mg total) by mouth at bedtime. 01/31/17  Yes Charm Rings, NP  gabapentin (NEURONTIN) 100 MG capsule Take 2 capsules (200 mg total) by mouth 3 (three) times daily. Patient not taking: Reported on 05/31/2017 01/31/17   Charm Rings, NP  benztropine (COGENTIN) 1 MG tablet Take 1 tablet (1 mg total) by mouth 2 (two) times daily. Patient not taking: Reported on 11/05/2014 06/19/13 04/07/15  Thermon Leyland, NP  Oxcarbazepine (TRILEPTAL) 600 MG tablet Take 1 tablet (600 mg total) by mouth 2 (two) times daily. Patient not taking: Reported on 11/05/2014 06/19/13 04/07/15  Thermon Leyland, NP    Family History History reviewed. No pertinent family history.  Social History Social History   Tobacco Use  . Smoking status: Current Every Day Smoker    Packs/day: 1.00    Years: 15.00    Pack years: 15.00    Types: Cigarettes  . Smokeless tobacco: Never Used  Substance Use Topics  . Alcohol use: No    Comment: seldom  . Drug use: No     Allergies   Haldol [haloperidol decanoate]   Review of Systems Review of Systems  HENT: Positive for congestion, postnasal drip and rhinorrhea.   Cardiovascular: Positive for chest pain and leg swelling (ankle).  All other systems reviewed and are negative.    Physical Exam Updated Vital Signs BP 118/64   Pulse (!) 116   Temp 98.1 F (  36.7 C) (Oral)   Resp 19   Ht 6' (1.829 m)   SpO2 95%   BMI 33.91 kg/m   Physical Exam  Constitutional: He is oriented to person, place, and time. He appears well-developed and well-nourished.  Non toxic. NAD.  HENT:  Head: Normocephalic and atraumatic.  Right Ear: External ear normal.  Left Ear: External ear normal.  Nose: Mucosal edema and rhinorrhea present.  Mouth/Throat: Uvula is midline.  Moderate mucosal edema with green rhinorrhea. Sounds congested. MMM. Oropharynx and tonsils clear.   Eyes: Conjunctivae,  EOM and lids are normal. Pupils are equal, round, and reactive to light.  Neck: Trachea normal and normal range of motion. No tracheal deviation present.  Trachea midline  Cardiovascular: Normal rate, regular rhythm, S1 normal, S2 normal and normal heart sounds.  No murmur heard. Pulses:      Carotid pulses are 2+ on the right side, and 2+ on the left side.      Radial pulses are 2+ on the right side, and 2+ on the left side.       Dorsalis pedis pulses are 2+ on the right side, and 2+ on the left side.       Posterior tibial pulses are 2+ on the right side, and 2+ on the left side.  No obvious JVD, difficult due to body habitus. Tachycardic. Trace edema to bilateral ankles. No calf tenderness or asymmetry.   Pulmonary/Chest: Effort normal. Tachypnea noted. No respiratory distress. He has decreased breath sounds in the right lower field and the left lower field. He has wheezes in the right upper field, the right middle field, the right lower field, the left upper field, the left middle field and the left lower field.  Diffuse wheezing in all lung fields, decreased in lower lobes. Tachypnic. Deep inspiration causes left CP. No reproducible chest wall pain. No overlaying rash to thorax.  Abdominal: Soft. Bowel sounds are normal. There is no tenderness.  Obese abdomen, no obvious distention or rigidity.  Musculoskeletal: Normal range of motion.  Lymphadenopathy:       Head (left side): Submandibular adenopathy present.    He has no cervical adenopathy.  Tender left submandibular lymphadenoatphy  Neurological: He is alert and oriented to person, place, and time. GCS eye subscore is 4. GCS verbal subscore is 5. GCS motor subscore is 6.  Moving all four extremities in coordinated fashion.  Skin: Skin is warm and dry. Capillary refill takes less than 2 seconds.  No rash to chest wall  Psychiatric: He has a normal mood and affect. His speech is normal and behavior is normal. Judgment and thought  content normal. Cognition and memory are normal.  Nursing note and vitals reviewed.    ED Treatments / Results  Labs (all labs ordered are listed, but only abnormal results are displayed) Labs Reviewed  BASIC METABOLIC PANEL - Abnormal; Notable for the following components:      Result Value   Potassium 3.4 (*)    Glucose, Bld 100 (*)    BUN 5 (*)    Calcium 8.8 (*)    All other components within normal limits  CBC  BRAIN NATRIURETIC PEPTIDE  HEPARIN LEVEL (UNFRACTIONATED)  I-STAT TROPONIN, ED  I-STAT TROPONIN, ED    EKG  EKG Interpretation  Date/Time:  Tuesday December 13 2017 12:09:34 EDT Ventricular Rate:  110 PR Interval:    QRS Duration: 92 QT Interval:  322 QTC Calculation: 436 R Axis:   80 Text Interpretation:  Sinus  tachycardia Low voltage, precordial leads Abnormal ekg Confirmed by Gerhard MunchLockwood, Robert [phone]) on 12/13/2017 12:26:26 PM       Radiology Dg Chest 2 View  Result Date: 12/13/2017 CLINICAL DATA:  Left-sided chest pain. EXAM: CHEST - 2 VIEW COMPARISON:  05/28/2016. FINDINGS: Mediastinum hilar structures normal. Cardiomegaly with normal pulmonary vascularity. Low lung volumes with mild bibasilar atelectasis/infiltrates. No scratched it tiny left pleural effusion cannot be excluded. No pneumothorax. IMPRESSION: 1.  Low lung volumes with mild bibasilar atelectasis/infiltrates. 2.  Cardiomegaly.  No pulmonary venous congestion. Electronically Signed   By: Maisie Fushomas  Register   On: 12/13/2017 08:56   Ct Angio Chest Pe W And/or Wo Contrast  Result Date: 12/13/2017 CLINICAL DATA:  Chest pain after exertion EXAM: CT ANGIOGRAPHY CHEST WITH CONTRAST TECHNIQUE: Multidetector CT imaging of the chest was performed using the standard protocol during bolus administration of intravenous contrast. Multiplanar CT image reconstructions and MIPs were obtained to evaluate the vascular anatomy. CONTRAST:  100mL ISOVUE-370 IOPAMIDOL (ISOVUE-370) INJECTION 76% COMPARISON:  None. FINDINGS:  Cardiovascular: Suboptimal opacification of the pulmonary arteries due to contrast bolus timing. This limits its assessment beyond the segmental level.There are filling defects within the left lower lobe posterior basal and lateral basal segmental arteries. There is also a filling defect within the right lower lobe posterior basal segmental branch. The main pulmonary artery is within normal limits for size. There is a normal 3-vessel arch branching pattern without evidence of acute aortic syndrome. There is noaortic atherosclerosis. Heart size is normal, without pericardial effusion. Mediastinum/Nodes: No mediastinal, hilar or axillary lymphadenopathy. The visualized thyroid and thoracic esophageal course are unremarkable. Lungs/Pleura: Small left pleural effusion. Associated left basilar atelectasis. Upper Abdomen: Contrast bolus timing is not optimized for evaluation of the abdominal organs. Within this limitation, the visualized organs of the upper abdomen are normal. Musculoskeletal: No chest wall abnormality. No acute or significant osseous findings. Review of the MIP images confirms the above findings. IMPRESSION: 1. Bilateral lower lobe segmental branch pulmonary emboli involving the right posterior basal segmental artery and left posterior and lateral basal segmental arteries. 2. No right heart strain. 3. Small left pleural effusion with associated atelectasis. Critical Value/emergent results were called by telephone at the time of interpretation on 12/13/2017 at 2:35 pm to [facility] , who verbally acknowledged these results. Electronically Signed   By: Deatra RobinsonKevin  Herman M.D.   On: 12/13/2017 14:37    Procedures Procedures (including critical care time)  CRITICAL CARE Performed by: Liberty Handylaudia J Chenae Brager   Total critical care time: 30 minutes  Critical care time was exclusive of separately billable procedures and treating other patients.  Critical care was necessary to treat or prevent imminent or  life-threatening deterioration.  Critical care was time spent personally by me on the following activities: development of treatment plan with patient and/or surrogate as well as nursing, discussions with consultants, evaluation of patient's response to treatment, examination of patient, obtaining history from patient or surrogate, ordering and performing treatments and interventions, ordering and review of laboratory studies, ordering and review of radiographic studies, pulse oximetry and re-evaluation of patient's condition. c Medications Ordered in ED Medications  heparin ADULT infusion 100 units/mL (25000 units/27650mL sodium chloride 0.45%) (1,650 Units/hr Intravenous New Bag/Given 12/13/17 1543)  ipratropium-albuterol (DUONEB) 0.5-2.5 (3) MG/3ML nebulizer solution 3 mL (3 mLs Nebulization Given 12/13/17 1243)  aspirin chewable tablet 324 mg (324 mg Oral Given 12/13/17 1315)  iopamidol (ISOVUE-370) 76 % injection (100 mLs  Contrast Given 12/13/17 1407)  sodium chloride 0.9 %  bolus 1,000 mL (1,000 mLs Intravenous New Bag/Given 12/13/17 1530)  acetaminophen (TYLENOL) tablet 1,000 mg (1,000 mg Oral Given 12/13/17 1528)  heparin bolus via infusion 5,250 Units (5,250 Units Intravenous Bolus from Bag 12/13/17 1543)     Initial Impression / Assessment and Plan / ED Course  I have reviewed the triage vital signs and the nursing notes.  Pertinent labs & imaging results that were available during my care of the patient were reviewed by me and considered in my medical decision making (see chart for details).  Clinical Course as of Dec 13 1628  Tue Dec 13, 2017  1214 FINDINGS: Mediastinum hilar structures normal. Cardiomegaly with normal pulmonary vascularity. Low lung volumes with mild bibasilar atelectasis/infiltrates. No scratched it tiny left pleural effusion cannot be excluded. No pneumothorax.  IMPRESSION: 1. Low lung volumes with mild bibasilar atelectasis/infiltrates.  2. Cardiomegaly. No  pulmonary venous congestion. DG Chest 2 View [CG]  1232 Sinus tachycardia Low voltage, precordial leads EKG 12-Lead [CG]  1435 Radiologist - bilateral PE with no heart strain  [CG]  1442 IMPRESSION: 1. Bilateral lower lobe segmental branch pulmonary emboli involving the right posterior basal segmental artery and left posterior and lateral basal segmental arteries. 2. No right heart strain. 3. Small left pleural effusion with associated atelectasis.  Critical Value/emergent results were called by telephone at the time of interpretation on 12/13/2017 at 2:35 pm to [facility] , who verbally acknowledged these results. CT Angio Chest PE W and/or Wo Contrast [CG]  1442 Pulse Rate: (!) 112 [CG]  1443 Pulse Rate: (!) 109 [CG]  1443 Resp: (!) 25 [CG]  1443 Resp: (!) 28 [CG]    Clinical Course User Index [CG] Liberty Handy, PA-C    42 yo here with left sided sharp pleuritic CP and SOB. Tachycardic and tachypnic. Trace bilateral LE edema, symmetric, on exam.  Otherwise afebrile and hemodynamically stable.   Concern for ACS, PNA, PE. Less likely dissection, PTX. He does have h/o tobacco abuse, cocaine abuse and schizophrenia. Denies IVDU, however poor historian endocarditis/pericarditis on differential as well.   Final Clinical Impressions(s) / ED Diagnoses   Labs and imaging reviewed remarkable for bilateral PEs with small left pleural effusion. No heart strain. CBC, BMP, trop x 2, BNP WNL. Pt has remained tachycardic and tachypnic, complaining of pain with movement and deep breaths. Given h/o psych illness, polysubstance abuse, persistent tachycardic/tachypnic will request admission. Heparin, IVF, tylenol started in ED. Pt shared with Dr Jeraldine Loots.   Final diagnoses:  Pulmonary embolism, bilateral [facility])    ED Discharge Orders    None       Jerrell Mylar 12/13/17 1630    Gerhard Munch, MD 12/15/17 [phone]

## 2017-12-13 NOTE — ED Notes (Signed)
Pt's O2 sats 90-92% on room air. Pt placed on 2L [place]

## 2017-12-13 NOTE — Progress Notes (Signed)
I saw and examined this patient.  Discussed with Drs. Diallo and Bland.  We agreed on a plan.  I will cosign the H&PE when available.  Briefly, 42 yo male disabled due to paranoid schizophrenia presents with chest pain and SOB.  WU in the ER shows that patient has bilateral pulmonary emboli.  This is his first clotting episode.  He does recount a strong family hx of clotting.  Seems unprovoked, I.e. No prolonged immobilization, trauma or recent surgery.  Incidentally, he admits to occasional cocaine abuse.   He is cardiovascularly stable with no tachycardia, tachypnea or hypoxia at this moment.  Will admit for systemic anticoagulation.  Since he is dually eligible, he should have no trouble affording a NOAC to treat his acute DVT.  The important decision will be longterm anticoagulation.  He is male with an unprovoked DVT and we should consider thrombophilia WU and/or lifelong anticoagulation.

## 2017-12-13 NOTE — H&P (Signed)
Family Medicine Teaching [facility] Admission History and Physical Service Pager: [phone]  Patient name: [name] Medical record number: [id number] Date of birth: Jul 16, 1976 Age: 42 y.o. Gender: male  Primary Care Provider: Renaye Rakers, MD Consultants: None Code Status: Full   Chief Complaint: Chest pain and shortness of breath  Assessment and Plan: [name] is a 42 y.o. male past medical history significant for paranoid schizophrenia, substance abuse, tobacco abuse presenting with worsening shortness of breath and sharp severe left-sided chest pain and found to have bilateral pulmonary embolus.  #Shortness of breath/chest pain, subacute Patient presented with worsening shortness of breath, and sharp left-sided chest pain for the past few weeks.  No cardiac or pulmonary history.  Patient was is a substance abuser use cocaine yesterday evening.  Upon admission to the ED troponin was negative, chest x-ray did not show any acute findings.  Patient reports a significant family history of blood clots.  Patient has a Wells score 1. CTA showed bilateral lower lobe segmental branch pulmonary emboli without any heart strain.  Small pleural effusion also noted.  This appears to be an unprovoked PE.  Patient also complained on physical exam of bilateral calf pain.  No swelling noted.  Patient referred oxygen saturation placed on nasal cannula 2 L.  No prior oxygen needs.  Patient denies any cough, fever, chills would be suspicious for superimposed pulmonary process such as pneumonia.  Low suspicion for coronary event given negative troponin and normal EKG. Patient was started on heparin and will need further workup to determine etiology of DVT/PE. --Admit to FMTS, admitting physician Dr. Leveda Anna --Admit to telemetry --Continue heparin per pharmacy for VTE treatment --Will transition to NOAC when able --Follow-up on echo  --Follow-up on CMP, lipid panel, TSH, A1c, --Acetaminophen 650 mg  every 6 as needed --Zofran 4 mg every 6 as needed --Monitor vitals --EKG as needed --Continue oxygen therapy, wean as able  #Paranoid schizophrenia, stable Patient is currently receiving Depo Risperdal injection at [facility].  Patient missed last dose a week ago after mother's passing.  Will get in touch with [facility] to clarify current regimen.  If unable will start patient on Risperdal.  Patient is currently mentating well and seems appropriate. --Continue Risperdal injectable after confirmation from treatment Center  #Substance abuse, chronic Patient reports doing cocaine yesterday evening.  Patient has no history of substance abuse mostly on crack cocaine.  Patient reports decreased use in the past few months.  Will monitor for symptoms of withdrawal. --Avoid beta-blockers --Monitor for withdrawal symptoms  #Tobacco abuse  patient has a 40-pack-year history, and is currently smoking about 1-1/2 pack a day. --Prescribe 21 mg nicotine patch  FEN/GI: Heart healthy diet Prophylaxis: Heparin drip  Disposition: Home pending clinical improvement  History of Present Illness:  [name] is a 42 y.o. male with a past medical history significant for paranoid schizophrenia, substance abuse, tobacco abuse presenting with shortness of breath and left-sided sharp chest pain.  Patient reports that he has been short of breath for the past few days, however yesterday evening he started expressing severe chest pain after helping his friend lift some boxes earlier in the day.  Patient thought about calling EMS but felt that he was too late in the evening.  Patient endorses doing some crack cocaine to help with the chest pain.  This morning, patient attempted to go to the store but was too weak to stand up and decided to call EMS.  Patient reports that  he has been tasting blood in his mouth for the past month. Patient also noted bilateral lower extremity pain and swelling for the past few days.  Patient  denies any fevers, chills, abdominal pain, nausea, vomiting, dizziness, headaches, vision change. Patient [facility]. In the ED, patient was found to be tachycardic, chest x-ray was negative for any acute findings.  CTA showed bilateral lower lobe PE with no heart strain.  Patient was started on heparin.  Patient continued to complain of shortness of breath flow oxygen saturation and was placed on 2 L nasal cannula.  Family medicine teaching service was called for admission for medical management.  Review Of Systems: Per HPI with the following additions:   Review of Systems  Constitutional: Negative.   HENT: Negative.   Eyes: Negative.   Respiratory: Positive for shortness of breath.   Cardiovascular: Positive for chest pain and leg swelling.  Gastrointestinal: Negative.   Genitourinary: Negative.   Musculoskeletal: Negative.   Skin: Negative.   Neurological: Negative.   Endo/Heme/Allergies: Negative.   Psychiatric/Behavioral: Negative.     Patient Active Problem List   Diagnosis Date Noted  . Pulmonary embolism (HCC) 12/13/2017  . Pulmonary embolism, bilateral (HCC)   . Paranoid schizophrenia (HCC)   . Cocaine abuse with cocaine-induced mood disorder (HCC) 06/30/2017  . Substance induced mood disorder (HCC) 11/29/2015  . Homeless   . Suicidal ideation   . Schizoaffective disorder, unspecified type (HCC)   . Schizoaffective disorder (HCC) 05/08/2013  . Cocaine abuse (HCC) 11/11/2011    Past Medical History: Past Medical History:  Diagnosis Date  . Boxer's fracture   . Hypertension   . Schizophrenic disorder [facility](HCC)     Past Surgical History: History reviewed. No pertinent surgical history.  Social History: Social History   Tobacco Use  . Smoking status: Current Every Day Smoker    Packs/day: 1.00    Years: 15.00    Pack years: 15.00    Types: Cigarettes  . Smokeless tobacco: Never Used  Substance Use  Topics  . Alcohol use: No    Comment: seldom  . Drug use: No   Additional social history: Please also refer to relevant sections of EMR.  Family History: History reviewed. No pertinent family history. (If not completed, MUST add something in)  Allergies and Medications: Allergies  Allergen Reactions  . Haldol [Haloperidol Decanoate] Other (See Comments)    'I curl up"   No current facility-administered medications on file prior to encounter.    Current Outpatient Medications on File Prior to Encounter  Medication Sig Dispense Refill  . gabapentin (NEURONTIN) 800 MG tablet Take 800 mg by mouth 3 (three) times daily.    Marland Kitchen. OLANZapine zydis (ZYPREXA) 20 MG disintegrating tablet Take 1 tablet (20 mg total) by mouth at bedtime. 30 tablet 0  . gabapentin (NEURONTIN) 100 MG capsule Take 2 capsules (200 mg total) by mouth 3 (three) times daily. (Patient not taking: Reported on 05/31/2017) 120 capsule 0  . [DISCONTINUED] benztropine (COGENTIN) 1 MG tablet Take 1 tablet (1 mg total) by mouth 2 (two) times daily. (Patient not taking: Reported on 11/05/2014) 60 tablet 0  . [DISCONTINUED] Oxcarbazepine (TRILEPTAL) 600 MG tablet Take 1 tablet (600 mg total) by mouth 2 (two) times daily. (Patient not taking: Reported on 11/05/2014) 60 tablet 0    Objective: BP 139/89   Pulse (!) 123   Temp 98.1 F (36.7 C) (Oral)   Resp (!) 24  Ht 6' (1.829 m)   SpO2 95%   BMI 33.91 kg/m    General: Overweight man, no acute distress,  able to participate in exam HEENT: Moist mucous membrane, PERRLA, xanthelasma noted right upper lower eyelid Cardiac: RRR, normal heart sounds, no murmurs. 2+ radial and PT pulses bilaterally Respiratory: CTAB, normal effort, No wheezes, rales or rhonchi Abdomen: soft, mildly tender on the left side upon palpation, nondistended, no hepatic or splenomegaly, +BS Extremities: Bilateral lower extremity pain with positive Homan Skin: warm and dry, no rashes noted Neuro: alert and  oriented x4, no focal deficits Psych: Normal affect and mood  Labs and Imaging: CBC BMET  Recent Labs  Lab 12/13/17 0835  WBC 8.9  HGB 13.8  HCT 40.2  PLT 212   Recent Labs  Lab 12/13/17 0835  NA 138  K 3.4*  CL 104  CO2 24  BUN 5*  CREATININE 0.83  GLUCOSE 100*  CALCIUM 8.8*     BNP:14.7 Troponin:0.0  Dg Chest 2 View  Result Date: 12/13/2017 CLINICAL DATA:  Left-sided chest pain. EXAM: CHEST - 2 VIEW COMPARISON:  05/28/2016. FINDINGS: Mediastinum hilar structures normal. Cardiomegaly with normal pulmonary vascularity. Low lung volumes with mild bibasilar atelectasis/infiltrates. No scratched it tiny left pleural effusion cannot be excluded. No pneumothorax. IMPRESSION: 1.  Low lung volumes with mild bibasilar atelectasis/infiltrates. 2.  Cardiomegaly.  No pulmonary venous congestion. Electronically Signed   By: Maisie Fus  Register   On: 12/13/2017 08:56   Ct Angio Chest Pe W And/or Wo Contrast  Result Date: 12/13/2017 CLINICAL DATA:  Chest pain after exertion EXAM: CT ANGIOGRAPHY CHEST WITH CONTRAST TECHNIQUE: Multidetector CT imaging of the chest was performed using the standard protocol during bolus administration of intravenous contrast. Multiplanar CT image reconstructions and MIPs were obtained to evaluate the vascular anatomy. CONTRAST:  ISOVUE-370 IOPAMIDOL (ISOVUE-370) INJECTION 76% COMPARISON:  None. FINDINGS: Cardiovascular: Suboptimal opacification of the pulmonary arteries due to contrast bolus timing. This limits its assessment beyond the segmental level.There are filling defects within the left lower lobe posterior basal and lateral basal segmental arteries. There is also a filling defect within the right lower lobe posterior basal segmental branch. The main pulmonary artery is within normal limits for size. There is a normal 3-vessel arch branching pattern without evidence of acute aortic syndrome. There is noaortic atherosclerosis. Heart size is normal, without  pericardial effusion. Mediastinum/Nodes: No mediastinal, hilar or axillary lymphadenopathy. The visualized thyroid and thoracic esophageal course are unremarkable. Lungs/Pleura: Small left pleural effusion. Associated left basilar atelectasis. Upper Abdomen: Contrast bolus timing is not optimized for evaluation of the abdominal organs. Within this limitation, the visualized organs of the upper abdomen are normal. Musculoskeletal: No chest wall abnormality. No acute or significant osseous findings. Review of the MIP images confirms the above findings. IMPRESSION: 1. Bilateral lower lobe segmental branch pulmonary emboli involving the right posterior basal segmental artery and left posterior and lateral basal segmental arteries. 2. No right heart strain. 3. Small left pleural effusion with associated atelectasis. Critical Value/emergent results were called by telephone at the time of interpretation on 12/13/2017 at 2:35 pm to [facility] , who verbally acknowledged these results. Electronically Signed   By: Deatra Robinson M.D.   On: 12/13/2017 14:37    Lovena Neighbours, MD 12/13/2017, 6:30 PM PGY-2, [facility] FPTS Intern pager: [phone], text pages welcome

## 2017-12-13 NOTE — Progress Notes (Signed)
ANTICOAGULATION CONSULT NOTE - Initial Consult  Pharmacy Consult for Heparin Indication: pulmonary embolus  Allergies  Allergen Reactions  . Haldol [Haloperidol Decanoate] Other (See Comments)    'I curl up"    Patient Measurements:    Ht: 6' Wt: 250 lb (113 kg) per Oct 2018 Pt stated Wt: 300 lb (136 kg) IBW: 77.6 kg Heparin Dosing Weight: 105 kg  Vital Signs: Temp: 98.1 F (36.7 C) (03/19 0832) Temp Source: Oral (03/19 0832) BP: 100/81 (03/19 1457) Pulse Rate: 119 (03/19 1457)  Labs: Recent Labs    12/13/17 0835  HGB 13.8  HCT 40.2  PLT 212  CREATININE 0.83    CrCl cannot be calculated (Unknown ideal weight.).   Medical History: Past Medical History:  Diagnosis Date  . Boxer's fracture   . Hypertension   . Schizophrenic disorder (HCC)     Assessment: [age] YOM who presented on 3/19 with CP and found to have B/L pulmonary emboli with no R-heart strain.   The patient states that he has had no recent surgeries or hx CVA and was not on any blood thinners PTA. Will watch for an updated measured weight to be recorded this admission (see weights listed above). Baseline CBC wnl.   Goal of Therapy:  Heparin level 0.3-0.7 units/ml Monitor platelets by anticoagulation protocol: Yes   Plan:  1. Heparin bolus of 5250 units x 1 2. Start Heparin at a rate of 1650 units/hr (16.5 ml/hr) 3. Will continue to monitor for any signs/symptoms of bleeding and will follow up with heparin level in 6 hours   Thank you for allowing pharmacy to be a part of this patient's care.  Georgina PillionElizabeth Adessa Primiano, PharmD, BCPS Clinical Pharmacist Pager: [phone] Clinical phone for 12/13/2017: [phone] 12/13/2017 3:34 PM

## 2017-12-13 NOTE — ED Triage Notes (Signed)
Pt reports doing exertional work yesterday outside and then onset of left side back/chest pain. His chest pain increases with movement. No acute distress is noted at triage. ekg done.

## 2017-12-13 NOTE — Progress Notes (Signed)
FPTS Interim Progress Note  S: Received page by RN that patient had chest pain 10/10. When I arrived at room patient resting comfortably in bed. When woken patient states he has some chest pain. I asked patient if he would be willing to try tylenol and kpad and patient is agreeable. States he would just like to get some rest.   Chest pain -will obtain troponin. Istat troponin in ED negative -will obtain EKG -Kpad prn  -tylenol prn   Oralia ManisAbraham, Gracen Ringwald, DO 12/13/2017, 10:12 PM PGY-1, [facility] Service pager [phone]

## 2017-12-13 NOTE — Progress Notes (Signed)
ANTICOAGULATION CONSULT NOTE  Pharmacy Consult for Heparin Indication: pulmonary embolus  Allergies  Allergen Reactions  . Haldol [Haloperidol Decanoate] Other (See Comments)    'I curl up"    Patient Measurements: Height: 6' (182.9 cm) Weight: 286 lb 2.5 oz (129.8 kg) IBW/kg (Calculated) : 77.6  Ht: 6' Wt: 250 lb (113 kg) per Oct 2018 Pt stated Wt: 300 lb (136 kg) IBW: 77.6 kg Heparin Dosing Weight: 105 kg  Vital Signs: Temp: 98.2 F (36.8 C) (03/19 2119) Temp Source: Oral (03/19 2119) BP: 138/94 (03/19 2119) Pulse Rate: 112 (03/19 2119)  Labs: Recent Labs    12/13/17 0835 12/13/17 2145  HGB 13.8  --   HCT 40.2  --   PLT 212  --   HEPARINUNFRC  --  <0.10*  CREATININE 0.83  --   TROPONINI  --  <0.03    Estimated Creatinine Clearance: 164.8 mL/min (by C-G formula based on SCr of 0.83 mg/dL).  Assessment: 42 y.o. male with PE for heparin  Goal of Therapy:  Heparin level 0.3-0.7 units/ml Monitor platelets by anticoagulation protocol: Yes   Plan:  Heparin 4000 units IV bolus, then increase heparin 2150 units/hr Check heparin level in 6 hours.    Geannie RisenGreg Jalisia Puchalski, PharmD, BCPS  12/13/2017 11:02 PM

## 2017-12-14 ENCOUNTER — Inpatient Hospital Stay (HOSPITAL_COMMUNITY): Payer: Medicare Other

## 2017-12-14 DIAGNOSIS — R0602 Shortness of breath: Secondary | ICD-10-CM

## 2017-12-14 DIAGNOSIS — R079 Chest pain, unspecified: Secondary | ICD-10-CM

## 2017-12-14 LAB — CBC
HCT: 38.6 % — ABNORMAL LOW (ref 39.0–52.0)
Hemoglobin: 12.8 g/dL — ABNORMAL LOW (ref 13.0–17.0)
MCH: 31.7 pg (ref 26.0–34.0)
MCHC: 33.2 g/dL (ref 30.0–36.0)
MCV: 95.5 fL (ref 78.0–100.0)
Platelets: 205 10*3/uL (ref 150–400)
RBC: 4.04 MIL/uL — ABNORMAL LOW (ref 4.22–5.81)
RDW: 13.6 % (ref 11.5–15.5)
WBC: 8.3 10*3/uL (ref 4.0–10.5)

## 2017-12-14 LAB — HIV ANTIBODY (ROUTINE TESTING W REFLEX): HIV Screen 4th Generation wRfx: NONREACTIVE

## 2017-12-14 LAB — ECHOCARDIOGRAM COMPLETE
HEIGHTINCHES: 72 in
WEIGHTICAEL: 4578.51 [oz_av]

## 2017-12-14 LAB — HEMOGLOBIN A1C
Hgb A1c MFr Bld: 4.8 % (ref 4.8–5.6)
Mean Plasma Glucose: 91.06 mg/dL

## 2017-12-14 LAB — COMPREHENSIVE METABOLIC PANEL
ALBUMIN: 3.5 g/dL (ref 3.5–5.0)
ALT: 14 U/L — AB (ref 17–63)
AST: 17 U/L (ref 15–41)
Alkaline Phosphatase: 94 U/L (ref 38–126)
Anion gap: 8 (ref 5–15)
BILIRUBIN TOTAL: 1.2 mg/dL (ref 0.3–1.2)
BUN: <5 mg/dL — ABNORMAL LOW (ref 6–20)
CHLORIDE: 103 mmol/L (ref 101–111)
CO2: 28 mmol/L (ref 22–32)
CREATININE: 0.75 mg/dL (ref 0.61–1.24)
Calcium: 8.9 mg/dL (ref 8.9–10.3)
GFR calc Af Amer: >60 mL/min (ref >60)
GLUCOSE: 127 mg/dL — AB (ref 65–99)
POTASSIUM: 3.5 mmol/L (ref 3.5–5.1)
Sodium: 139 mmol/L (ref 135–145)
TOTAL PROTEIN: 6.5 g/dL (ref 6.5–8.1)

## 2017-12-14 LAB — HEPARIN LEVEL (UNFRACTIONATED): HEPARIN UNFRACTIONATED: 0.32 [IU]/mL (ref 0.30–0.70)

## 2017-12-14 MED ORDER — RIVAROXABAN (XARELTO) EDUCATION KIT FOR DVT/PE PATIENTS
PACK | Freq: Once | Status: AC
  Administered 2017-12-14: 16:00:00
  Filled 2017-12-14 (×2): qty 1

## 2017-12-14 MED ORDER — RIVAROXABAN 15 MG PO TABS
15.0000 mg | ORAL_TABLET | Freq: Two times a day (BID) | ORAL | Status: DC
  Administered 2017-12-14 – 2017-12-15 (×3): 15 mg via ORAL
  Filled 2017-12-14 (×3): qty 1

## 2017-12-14 MED ORDER — SENNA 8.6 MG PO TABS: 1.0000 | ORAL_TABLET | Freq: Every day | ORAL | Status: DC | PRN

## 2017-12-14 MED ORDER — OLANZAPINE 10 MG PO TABS
20.0000 mg | ORAL_TABLET | Freq: Every day | ORAL | Status: DC
  Administered 2017-12-14: 20 mg via ORAL
  Filled 2017-12-14 (×3): qty 2

## 2017-12-14 MED ORDER — RIVAROXABAN 15 MG PO TABS
15.0000 mg | ORAL_TABLET | Freq: Once | ORAL | Status: AC
  Administered 2017-12-14: 15 mg via ORAL
  Filled 2017-12-14: qty 1

## 2017-12-14 MED ORDER — RIVAROXABAN 15 MG PO TABS: 15.0000 mg | ORAL_TABLET | Freq: Two times a day (BID) | ORAL | Status: DC

## 2017-12-14 MED ORDER — PALIPERIDONE PALMITATE 156 MG/ML IM SUSP
156.0000 mg | Freq: Once | INTRAMUSCULAR | Status: AC
  Administered 2017-12-15: 156 mg via INTRAMUSCULAR
  Filled 2017-12-14: qty 1

## 2017-12-14 MED ORDER — POLYETHYLENE GLYCOL 3350 17 G PO PACK
17.0000 g | PACK | Freq: Every day | ORAL | Status: DC
  Administered 2017-12-14 – 2017-12-15 (×2): 17 g via ORAL
  Filled 2017-12-14 (×2): qty 1

## 2017-12-14 NOTE — Discharge Summary (Signed)
Family Medicine Teaching [facility] Discharge Summary  Patient name: [name] Medical record number: [id number] Date of birth: 1976/01/25 Age: 42 y.o. Gender: male Date of Admission: 12/13/2017  Date of Discharge: 12/15/17  Admitting Physician: Moses Manners, MD  Primary Care Provider: Renaye Rakers, MD Consultants: None  Indication for Hospitalization: SOB  Discharge Diagnoses/Problem List:  Unprovoked PE Paranoid schizophrenia Substance abuse Tobacco abuse Asthma Hypertension  Disposition: Home  Discharge Condition: stable  Discharge Exam:  General: NAD, pleasant ENTM: Moist mucous membranes Cardiovascular: RRR, no m/r/g, no LE edema Respiratory: minimal diffuse expiratory wheeze, normal work of breathing Gastrointestinal: soft, nontender, nondistended MSK: moves 4 extremities equally Derm: no rashes appreciated Neuro: CN II-XII grossly intact Psych: AOx3, appropriate affect  Brief Hospital Course:  This 42 year old male presented with chest pain and shortness of breath.  Patient had previously used cocaine the day before coming into the ED.  Upon admission to the ED troponin was negative, and chest x-ray did not show any acute findings.  Patient reported a significant family history of blood clots in CTA showed bilateral lower lobe segmental branch pulmonary emboli without any heartstring.  This appeared to be an unprovoked PE.  Initially patient was requiring Hastings of 2 L.  Patient was started on a heparin drip before being transitioned to Xarelto.  Prior to discharge patient was satting well on room air and did not go below 90s while ambulating with pulse ox.  Patient also had normal echo while admitted to the hospital.  While admitted patient was noted to have mild hypertension and was started on hydrochlorothiazide 12.5 mg daily for this.  Patient was also encouraged to stop using cocaine.  Patient reported a 40-pack-year history and is currently smoking 1-1/2  packs/day.  And nicotine patch was prescribed while patient was admitted patient was sent home with prescription for more patches.  Patient was encouraged to follow-up with a primary care physician in order to manage his hypertension as well as give him further prescriptions of his Xarelto to remain on this for long-term.  Patient voiced understanding of this.  Issues for Follow Up:  1. Patient to have Xarelto 15mg  BID for 21 days and then 20mg  daily  2. Patient encouraged to stop using cocaine. 3. Patient given his dose of [place] on 12/14/2017, will need next dose in 4 weeks.  4. Patient will need PCP. 5. Patient started on HCTZ 12.5mg  for mild htn while hospitalized.  6. Patient has 40-pack-year history of smoking will need to be encouraged to stop smoking.  Significant Procedures: None  Significant Labs and Imaging:  Recent Labs  Lab 12/13/17 0835 12/14/17 0314 12/15/17 0340  WBC 8.9 8.3 7.1  HGB 13.8 12.8* 12.7*  HCT 40.2 38.6* 39.0  PLT 212 205 221   Recent Labs  Lab 12/13/17 0835 12/14/17 0314 12/15/17 0340  NA 138 139 137  K 3.4* 3.5 3.6  CL 104 103 100*  CO2 24 28 28   GLUCOSE 100* 127* 117*  BUN 5* <5* <5*  CREATININE 0.83 0.75 0.87  CALCIUM 8.8* 8.9 8.6*  ALKPHOS  --  94  --   AST  --  17  --   ALT  --  14*  --   ALBUMIN  --  3.5  --    BNP 14.7 TSH 1.046 Lipid Panel: Chol 198, LDL 135, HDL 37, TG 132 A1c 4.8 HIV nonreactive  ECHO: Study Conclusions - Left ventricle: The cavity size was normal. There was  mild focal   basal hypertrophy of the septum. Systolic function was normal.   The estimated ejection fraction was in the range of 60% to 65%.   Wall motion was normal; there were no regional wall motion   abnormalities. The study is not technically sufficient to allow   evaluation of LV diastolic function. - Right ventricle: The cavity size was normal. Wall thickness was   normal.  Dg Chest 2 View  Result Date: 12/13/2017 CLINICAL DATA:   Left-sided chest pain. EXAM: CHEST - 2 VIEW COMPARISON:  05/28/2016. FINDINGS: Mediastinum hilar structures normal. Cardiomegaly with normal pulmonary vascularity. Low lung volumes with mild bibasilar atelectasis/infiltrates. No scratched it tiny left pleural effusion cannot be excluded. No pneumothorax. IMPRESSION: 1.  Low lung volumes with mild bibasilar atelectasis/infiltrates. 2.  Cardiomegaly.  No pulmonary venous congestion. Electronically Signed   By: Maisie Fushomas  Register   On: 12/13/2017 08:56   Ct Angio Chest Pe W And/or Wo Contrast  Result Date: 12/13/2017 CLINICAL DATA:  Chest pain after exertion EXAM: CT ANGIOGRAPHY CHEST WITH CONTRAST TECHNIQUE: Multidetector CT imaging of the chest was performed using the standard protocol during bolus administration of intravenous contrast. Multiplanar CT image reconstructions and MIPs were obtained to evaluate the vascular anatomy. CONTRAST:  100mL ISOVUE-370 IOPAMIDOL (ISOVUE-370) INJECTION 76% COMPARISON:  None. FINDINGS: Cardiovascular: Suboptimal opacification of the pulmonary arteries due to contrast bolus timing. This limits its assessment beyond the segmental level.There are filling defects within the left lower lobe posterior basal and lateral basal segmental arteries. There is also a filling defect within the right lower lobe posterior basal segmental branch. The main pulmonary artery is within normal limits for size. There is a normal 3-vessel arch branching pattern without evidence of acute aortic syndrome. There is noaortic atherosclerosis. Heart size is normal, without pericardial effusion. Mediastinum/Nodes: No mediastinal, hilar or axillary lymphadenopathy. The visualized thyroid and thoracic esophageal course are unremarkable. Lungs/Pleura: Small left pleural effusion. Associated left basilar atelectasis. Upper Abdomen: Contrast bolus timing is not optimized for evaluation of the abdominal organs. Within this limitation, the visualized organs of the  upper abdomen are normal. Musculoskeletal: No chest wall abnormality. No acute or significant osseous findings. Review of the MIP images confirms the above findings. IMPRESSION: 1. Bilateral lower lobe segmental branch pulmonary emboli involving the right posterior basal segmental artery and left posterior and lateral basal segmental arteries. 2. No right heart strain. 3. Small left pleural effusion with associated atelectasis. Critical Value/emergent results were called by telephone at the time of interpretation on 12/13/2017 at 2:35 pm to [facility] , who verbally acknowledged these results. Electronically Signed   By: Deatra RobinsonKevin  Herman M.D.   On: 12/13/2017 14:37   Results/Tests Pending at Time of Discharge: none  Discharge Medications:  Allergies as of 12/15/2017      Reactions   Haldol [haloperidol Decanoate] Other (See Comments)   'I curl up"      Medication List    TAKE these medications   albuterol (2.5 MG/3ML) 0.083% nebulizer solution Commonly known as:  PROVENTIL Inhale 3 mLs (2.5 mg total) into the lungs every 6 (six) hours as needed for wheezing or shortness of breath.   gabapentin 100 MG capsule Commonly known as:  NEURONTIN Take 2 capsules (200 mg total) by mouth 3 (three) times daily. What changed:  Another medication with the same name was removed. Continue taking this medication, and follow the directions you see here.   hydrochlorothiazide 12.5 MG capsule Commonly known as:  MICROZIDE Take  1 capsule (12.5 mg total) by mouth daily.   nicotine 21 mg/24hr patch Commonly known as:  NICODERM CQ - dosed in mg/24 hours Place 1 patch (21 mg total) onto the skin daily. Start taking on:  12/16/2017   OLANZapine zydis 20 MG disintegrating tablet Commonly known as:  ZYPREXA Take 1 tablet (20 mg total) by mouth at bedtime.   Rivaroxaban 15 MG Tabs tablet Commonly known as:  XARELTO Take 1 tablet (15 mg total) by mouth 2 (two) times daily with a meal for 21 days.       Discharge Instructions: Please refer to Patient Instructions section of EMR for full details.  Patient was counseled important signs and symptoms that should prompt return to medical care, changes in medications, dietary instructions, activity restrictions, and follow up appointments.   Follow-Up Appointments:   Belvin Gauss, [place], DO 12/15/2017, 2:55 PM PGY-1, [facility]

## 2017-12-14 NOTE — Plan of Care (Signed)
Patient is progressing in care plan goals 

## 2017-12-14 NOTE — Progress Notes (Signed)
ANTICOAGULATION CONSULT NOTE - Follow Up Consult  Pharmacy Consult for Heparin Indication: pulmonary embolus  Allergies  Allergen Reactions  . Haldol [Haloperidol Decanoate] Other (See Comments)    'I curl up"    Patient Measurements: Height: 6' (182.9 cm) Weight: 286 lb 2.5 oz (129.8 kg) IBW/kg (Calculated) : 77.6 Heparin Dosing Weight: 105 kg  Vital Signs: Temp: 98.3 F (36.8 C) (03/20 0512) Temp Source: Oral (03/20 0512) BP: 144/86 (03/20 0512) Pulse Rate: 107 (03/20 0512)  Labs: Recent Labs    12/13/17 0835 12/13/17 2145 12/14/17 0314  HGB 13.8  --  12.8*  HCT 40.2  --  38.6*  PLT 212  --  205  HEPARINUNFRC  --  <0.10* 0.32  CREATININE 0.83  --  0.75  TROPONINI  --  <0.03  --     Estimated Creatinine Clearance: 171 mL/min (by C-G formula based on SCr of 0.75 mg/dL).   Medications:  Infusions:  . heparin 2,150 Units/hr (12/13/17 2327)    Assessment: 42 year old male on anticoagulation with heparin for PE. His heparin level is now therapeutic after rate increase, but is on the lower end of the therapeutic range.  For PE treatment I would prefer for this level to be closer to the midpoint or upper end of the therapeutic range so I will increase his heparin rate.  His CBC is stable with no bleeding complications noted.  Goal of Therapy:  Heparin level 0.3-0.7 units/ml Monitor platelets by anticoagulation protocol: Yes   Plan:  Increase heparin to 2350 units/hr Check heparin level in 6 hours Daily heparin level and CBC  Monitor for bleeding complications  Estella HuskMichelle Angeli Demilio, Pharm.D., BCPS, BCIDP Clinical Pharmacist Phone: [phone] or [phone] 12/14/2017, 9:59 AM

## 2017-12-14 NOTE — Progress Notes (Addendum)
Family Medicine Teaching Service Daily Progress Note Intern Pager: [phone]  Patient name: [name] Medical record number: [id number] Date of birth: Apr 07, 1976 Age: 42 y.o. Gender: male  Primary Care Provider: Renaye Rakers, MD Consultants: None Code Status: Full  Pt Overview and Major Events to Date:  [name] is a 42 y.o. male past medical history significant for paranoid schizophrenia, substance abuse, tobacco abuse presenting with worsening shortness of breath and sharp severe left-sided chest pain and found to have bilateral pulmonary embolus.  Assessment and Plan:  #Shortness of breath/chest pain, subacute Patient presented with worsening shortness of breath, and sharp left-sided chest pain for the past few weeks. Patient was is a substance abuser use cocaine yesterday evening.  Patient reports a significant family history of blood clots. CTA showed bilateral lower lobe segmental branch PE without any heart strain.  Small pleural effusion also noted.  This appears to be an unprovoked PE. Still on nasal cannula 2 L.  --Will d/c heparin and transition to Xarelto 15mg  BID for 21 weeks and then 20mg  daily  --Will need out patient work up for unprovoked PE as labs would be skewed in acute setting --Follow-up on echo  --Acetaminophen 650 mg every 6 as needed --Zofran 4 mg every 6 as needed --Continue oxygen therapy, wean as able  #Paranoid schizophrenia, stable Patient is currently receiving Depo Risperdal injection at [facility].  Patient missed last dose a week ago after mother's passing.  Will get in touch with [facility] to clarify current regimen.  If unable will start patient on Risperdal.  Patient is currently mentating well and seems appropriate. --Continue Risperdal injectable after confirmation from treatment Center  #Substance abuse, chronic Patient reports doing cocaine evening prior to admission.  Patient has no history of substance abuse mostly on crack cocaine.   Patient reports decreased use in the past few months.  Will monitor for symptoms of withdrawal. --SW consult for help in stopping --Avoid beta-blockers --Monitor for withdrawal symptoms  #Tobacco abuse  patient has a 40-pack-year history, and is currently smoking about 1-1/2 pack a day. --Prescribe 21 mg nicotine patch  FEN/GI: Heart healthy diet Prophylaxis: Heparin drip> Xarelto  Disposition: continued inpatient stay  Subjective:  Patient is upset this morning because he thought he may die. Patient is feeling better than when he came in.  Objective: Temp:  [98.1 F (36.7 C)-98.3 F (36.8 C)] 98.3 F (36.8 C) (03/20 0512) Pulse Rate:  [105-123] 107 (03/20 0512) Resp:  [17-28] 20 (03/20 0512) BP: (100-159)/(64-97) 144/86 (03/20 0512) SpO2:  [92 %-97 %] 96 % (03/20 0512) Weight:  [286 lb 2.5 oz (129.8 kg)] 286 lb 2.5 oz (129.8 kg) (03/19 1827) Physical Exam: General: NAD, pleasant Eyes: PERRL, EOMI, no conjunctival pallor or injection ENTM: Moist mucous membranes Cardiovascular: RRR, no m/r/g, no LE edema Respiratory: CTA BL, normal work of breathing, on 2L [place] Gastrointestinal: soft, nontender, nondistended MSK: moves 4 extremities equally Derm: no rashes appreciated Neuro: CN II-XII grossly intact Psych: AOx3, appropriate affect  Laboratory: Recent Labs  Lab 12/13/17 0835 12/14/17 0314  WBC 8.9 8.3  HGB 13.8 12.8*  HCT 40.2 38.6*  PLT 212 205   Recent Labs  Lab 12/13/17 0835 12/14/17 0314  NA 138 139  K 3.4* 3.5  CL 104 103  CO2 24 28  BUN 5* <5*  CREATININE 0.83 0.75  CALCIUM 8.8* 8.9  PROT  --  6.5  BILITOT  --  1.2  ALKPHOS  --  94  ALT  --  14*  AST  --  17  GLUCOSE 100* 127*  BNP 14.7 TSH 1.046 Lipid Panel: Chol 198, LDL 135, HDL 37, TG 132 A1c 4.8  Imaging/Diagnostic Tests: Dg Chest 2 View  Result Date: 12/13/2017 CLINICAL DATA:  Left-sided chest pain. EXAM: CHEST - 2 VIEW COMPARISON:  05/28/2016. FINDINGS: Mediastinum hilar structures  normal. Cardiomegaly with normal pulmonary vascularity. Low lung volumes with mild bibasilar atelectasis/infiltrates. No scratched it tiny left pleural effusion cannot be excluded. No pneumothorax. IMPRESSION: 1.  Low lung volumes with mild bibasilar atelectasis/infiltrates. 2.  Cardiomegaly.  No pulmonary venous congestion. Electronically Signed   By: Maisie Fushomas  Register   On: 12/13/2017 08:56   Ct Angio Chest Pe W And/or Wo Contrast  Result Date: 12/13/2017 CLINICAL DATA:  Chest pain after exertion EXAM: CT ANGIOGRAPHY CHEST WITH CONTRAST TECHNIQUE: Multidetector CT imaging of the chest was performed using the standard protocol during bolus administration of intravenous contrast. Multiplanar CT image reconstructions and MIPs were obtained to evaluate the vascular anatomy. CONTRAST:  100mL ISOVUE-370 IOPAMIDOL (ISOVUE-370) INJECTION 76% COMPARISON:  None. FINDINGS: Cardiovascular: Suboptimal opacification of the pulmonary arteries due to contrast bolus timing. This limits its assessment beyond the segmental level.There are filling defects within the left lower lobe posterior basal and lateral basal segmental arteries. There is also a filling defect within the right lower lobe posterior basal segmental branch. The main pulmonary artery is within normal limits for size. There is a normal 3-vessel arch branching pattern without evidence of acute aortic syndrome. There is noaortic atherosclerosis. Heart size is normal, without pericardial effusion. Mediastinum/Nodes: No mediastinal, hilar or axillary lymphadenopathy. The visualized thyroid and thoracic esophageal course are unremarkable. Lungs/Pleura: Small left pleural effusion. Associated left basilar atelectasis. Upper Abdomen: Contrast bolus timing is not optimized for evaluation of the abdominal organs. Within this limitation, the visualized organs of the upper abdomen are normal. Musculoskeletal: No chest wall abnormality. No acute or significant osseous  findings. Review of the MIP images confirms the above findings. IMPRESSION: 1. Bilateral lower lobe segmental branch pulmonary emboli involving the right posterior basal segmental artery and left posterior and lateral basal segmental arteries. 2. No right heart strain. 3. Small left pleural effusion with associated atelectasis. Critical Value/emergent results were called by telephone at the time of interpretation on 12/13/2017 at 2:35 pm to [facility] , who verbally acknowledged these results. Electronically Signed   By: Deatra RobinsonKevin  Herman M.D.   On: 12/13/2017 14:37    Erlene Devita, [place], DO 12/14/2017, 8:14 AM PGY-1, [facility] FPTS Intern pager: [phone], text pages welcome

## 2017-12-14 NOTE — Progress Notes (Signed)
  Echocardiogram 2D Echocardiogram has been performed.  Carl SkeenVijay  Wasyl Dornfeld 12/14/2017, 3:54 PM

## 2017-12-15 LAB — CBC
HEMATOCRIT: 39 % (ref 39.0–52.0)
HEMOGLOBIN: 12.7 g/dL — AB (ref 13.0–17.0)
MCH: 31.2 pg (ref 26.0–34.0)
MCHC: 32.6 g/dL (ref 30.0–36.0)
MCV: 95.8 fL (ref 78.0–100.0)
Platelets: 221 10*3/uL (ref 150–400)
RBC: 4.07 MIL/uL — ABNORMAL LOW (ref 4.22–5.81)
RDW: 13.5 % (ref 11.5–15.5)
WBC: 7.1 10*3/uL (ref 4.0–10.5)

## 2017-12-15 LAB — BASIC METABOLIC PANEL
ANION GAP: 9 (ref 5–15)
CALCIUM: 8.6 mg/dL — AB (ref 8.9–10.3)
CO2: 28 mmol/L (ref 22–32)
Chloride: 100 mmol/L — ABNORMAL LOW (ref 101–111)
Creatinine, Ser: 0.87 mg/dL (ref 0.61–1.24)
GFR calc Af Amer: >60 mL/min (ref >60)
GFR calc non Af Amer: >60 mL/min (ref >60)
GLUCOSE: 117 mg/dL — AB (ref 65–99)
POTASSIUM: 3.6 mmol/L (ref 3.5–5.1)
Sodium: 137 mmol/L (ref 135–145)

## 2017-12-15 MED ORDER — HYDROCHLOROTHIAZIDE 12.5 MG PO CAPS: 12.5000 mg | ORAL_CAPSULE | Freq: Every day | ORAL | 0 refills | Status: DC

## 2017-12-15 MED ORDER — HYDROCHLOROTHIAZIDE 12.5 MG PO CAPS
12.5000 mg | ORAL_CAPSULE | Freq: Every day | ORAL | Status: DC
  Administered 2017-12-15: 12.5 mg via ORAL
  Filled 2017-12-15: qty 1

## 2017-12-15 MED ORDER — ALBUTEROL SULFATE (2.5 MG/3ML) 0.083% IN NEBU: 2.5000 mg | INHALATION_SOLUTION | Freq: Four times a day (QID) | RESPIRATORY_TRACT | Status: DC | PRN

## 2017-12-15 MED ORDER — NICOTINE 21 MG/24HR TD PT24: 21.0000 mg | MEDICATED_PATCH | Freq: Every day | TRANSDERMAL | 0 refills | Status: DC

## 2017-12-15 MED ORDER — RIVAROXABAN 15 MG PO TABS: 15.0000 mg | ORAL_TABLET | Freq: Two times a day (BID) | ORAL | 0 refills | Status: DC

## 2017-12-15 MED ORDER — ALBUTEROL SULFATE (2.5 MG/3ML) 0.083% IN NEBU: 2.5000 mg | INHALATION_SOLUTION | Freq: Four times a day (QID) | RESPIRATORY_TRACT | 12 refills | Status: DC | PRN

## 2017-12-15 NOTE — Clinical Social Work Note (Signed)
Clinical Social Work Assessment  Patient Details  Name: [name] MRN: [id number] Date of Birth: [birth year]  Date of referral:  12/15/17               Reason for consult:  Housing Concerns/Homelessness, Substance Use/ETOH Abuse                Permission sought to share information with:  (did not seek to shart information with anyone) Permission granted to share information::  No  Name::        Agency::     Relationship::     Contact Information:     Housing/Transportation Living arrangements for the past 2 months:  Apartment Source of Information:  Patient Patient Interpreter Needed:  None Criminal Activity/Legal Involvement Pertinent to Current Situation/Hospitalization:  No - Comment as needed Significant Relationships:  Friend, Siblings Lives with:  Self Do you feel safe going back to the place where you live?  No Need for family participation in patient care:  No (Coment)  Care giving concerns:  Patient lives independently in apartment- no concerns with his care at this time states he is doing it on his own.   Social Worker assessment / plan:  CSW consulted for concerns with patient housing and with substance abuse.  Per report patient does not have stable housing and is going between friend's houses.  CSW inquired about current housing situation and patient states he does have stable housing and is renting a apartment locally- no concerns with housing situation at this time.  CSW also consulted for current substance abuse.  Patient reports that he quit using illicit drugs and has not used since December.  CSW inquired about pt reporting using cocaine prior to admission and patient states this is not true- patient has not concerns about continuing to be abstinent from drugs and does not need resources at this time.  Patient states he goes to [facility] for medication assistance from his schizophrenia- states he had appt today but he needs to call to reschedule due to hospital  stay- pt requested phone number so he could do so- CSW provided.  Pt also inquiring about ACT team- states he used to have one but was dropped and then he was rejected from [facility] ACT team program.  CSW encouraged pt to follow up with [facility] about another ACT team since they will be following him outpatient.  Employment status:  Disabled (Comment on whether or not currently receiving Disability)(receives SSI) Insurance information:  Medicare PT Recommendations:  Not assessed at this time Information / Referral to community resources:  Other (Comment Required)([facility])  Patient/Family's Response to care:  Patient reports no care needs at this time- frustrated that CSW had been told pt didn't have stable housing and was currently using drugs.  Patient/Family's Understanding of and Emotional Response to Diagnosis, Current Treatment, and Prognosis:  Patient appears to be active in his own care and motivated to be compliant with treatment plan.  Patient frustrated with care received in hospital and perceived rudeness to him which he attributes to staff viewing him as a drug abuser- was upset he did not receive pain medications despite severe pain.  Patient excited to discharge from the hospital.  Emotional Assessment Appearance:  Appears stated age Attitude/Demeanor/Rapport:    Affect (typically observed):  Appropriate, Frustrated Orientation:  Oriented to Self, Oriented to Place, Oriented to  Time, Oriented to Situation Alcohol / Substance use:  Other(h/o illicit drugs- unclear if current) Psych involvement (Current and /or  in the community):  No (Comment)([facility])  Discharge Needs  Concerns to be addressed:  Care Coordination Readmission within the last 30 days:  No Current discharge risk:  None Barriers to Discharge:  No Barriers Identified   Burna SisUris, Tashonda Pinkus H, LCSW 12/15/2017, 9:54 AM

## 2017-12-15 NOTE — Progress Notes (Addendum)
Family Medicine Teaching Service Daily Progress Note Intern Pager: [phone]  Patient name: [name] Medical record number: [id number] Date of birth: 06/28/1976 Age: 42 y.o. Gender: male  Primary Care Provider: Renaye Rakers, MD Consultants: None Code Status: Full  Pt Overview and Major Events to Date:  [name] is a 42 y.o. male past medical history significant for paranoid schizophrenia, substance abuse, tobacco abuse presenting with worsening shortness of breath and sharp severe left-sided chest pain and found to have bilateral pulmonary embolus.  Assessment and Plan:  #Shortness of breath/chest pain 2/2 Unprovoked PE Patient reports a significant family history of blood clots.  --Will d/c heparin and transition to Xarelto 15mg  BID for 21 days and then 20mg  daily  --Will need out patient work up for unprovoked PE as labs would be skewed in acute setting --Normal ECHO --Acetaminophen 650 mg every 6 as needed --Zofran 4 mg every 6 as needed --Continue oxygen therapy, wean as able  #Paranoid schizophrenia, stable Patient is currently mentating well and seems appropriate. --Continue Invega Sustenna injectable and Zyprexa  #Substance abuse, chronic Patient reports doing cocaine evening prior to admission.  Patient has no history of substance abuse mostly on crack cocaine.  Patient reports decreased use in the past few months.  Will monitor for symptoms of withdrawal. --SW consult for help in stopping --Avoid beta-blockers --Monitor for withdrawal symptoms  #Tobacco abuse  patient has a 40-pack-year history, and is currently smoking about 1-1/2 pack a day. --Prescribe 21 mg nicotine patch  #Asthma Patient reports having albuterol inhaler at home but lost his PCP and care team - Albuterol as needed for wheeze  #Hypertension: newly hypertensive  - will start HCTZ - patient instructed to follow up   FEN/GI: Heart healthy diet Prophylaxis: Heparin drip>  Xarelto  Disposition: medically stable for discharge  Subjective:  Patient feels better. He is ready to go home after Invega  Objective: Temp:  [97.4 F (36.3 C)-98.2 F (36.8 C)] 98.2 F (36.8 C) (03/20 2003) Pulse Rate:  [108-118] 118 (03/20 2003) Resp:  [18-20] 18 (03/20 2003) BP: (135-141)/(76-92) 135/92 (03/20 2003) SpO2:  [96 %-98 %] 98 % (03/20 2003) Physical Exam: General: NAD, pleasant ENTM: Moist mucous membranes Cardiovascular: RRR, no m/r/g, no LE edema Respiratory: minimal diffuse expiratory wheeze, normal work of breathing Gastrointestinal: soft, nontender, nondistended MSK: moves 4 extremities equally Derm: no rashes appreciated Neuro: CN II-XII grossly intact Psych: AOx3, appropriate affect  Laboratory: Recent Labs  Lab 12/13/17 0835 12/14/17 0314 12/15/17 0340  WBC 8.9 8.3 7.1  HGB 13.8 12.8* 12.7*  HCT 40.2 38.6* 39.0  PLT 212 205 221   Recent Labs  Lab 12/13/17 0835 12/14/17 0314 12/15/17 0340  NA 138 139 137  K 3.4* 3.5 3.6  CL 104 103 100*  CO2 24 28 28   BUN 5* <5* <5*  CREATININE 0.83 0.75 0.87  CALCIUM 8.8* 8.9 8.6*  PROT  --  6.5  --   BILITOT  --  1.2  --   ALKPHOS  --  94  --   ALT  --  14*  --   AST  --  17  --   GLUCOSE 100* 127* 117*  BNP 14.7 TSH 1.046 Lipid Panel: Chol 198, LDL 135, HDL 37, TG 132 A1c 4.8 HIV nonreactive  Imaging/Diagnostic Tests: ECHO: Study Conclusions - Left ventricle: The cavity size was normal. There was mild focal basal hypertrophy of the septum. Systolic function was normal. The estimated ejection fraction was in  the range of 60% to 65%. Wall motion was normal; there were no regional wall motion abnormalities. The study is not technically sufficient to allow evaluation of LV diastolic function. - Right ventricle: The cavity size was normal. Wall thickness was normal.   Moo Gravley, [place], DO 12/15/2017, 9:40 AM PGY-1, [facility] FPTS Intern pager: [phone],  text pages welcome

## 2017-12-15 NOTE — Discharge Instructions (Addendum)
You were admitted to the hospital and found to have a PE (pulmonary embolism). This is a blood clot in your lungs.  We are glad you are feeling much better.  While admitted we started you on Xarelto which is a blood thinner to prevent further blood clots.  You should take one 15 mg tablet twice a day with food for the first 21 days ending on 01/04/18.  After this, on 01/05/18, you should take one 20 mg tablet daily with your biggest meal.  It is very important that you follow-up with a primary care physician.  You are also started on a blood pressure medication on his hydrochlorothiazide.  Please take this once daily.    Please do not hesitate to return to the ED if you have chest pain and increasing shortness of breath.  Information on my medicine - XARELTO (rivaroxaban) This medication education was reviewed with me or my healthcare representative as part of my discharge preparation.    WHY WAS XARELTO PRESCRIBED FOR YOU? Xarelto was prescribed to treat blood clots that may have been found in the veins of your legs (deep vein thrombosis) or in your lungs (pulmonary embolism) and to reduce the risk of them occurring again.  What do you need to know about Xarelto? The starting dose is one 15 mg tablet taken TWICE daily with food for the FIRST 21 DAYS then on 01/05/18  the dose is changed to one 20 mg tablet taken ONCE A DAY with your evening meal.  DO NOT stop taking Xarelto without talking to the health care provider who prescribed the medication.  Refill your prescription for 20 mg tablets before you run out.  After discharge, you should have regular check-up appointments with your healthcare provider that is prescribing your Xarelto.  In the future your dose may need to be changed if your kidney function changes by a significant amount.  What do you do if you miss a dose? If you are taking Xarelto TWICE DAILY and you miss a dose, take it as soon as you remember. You may take two 15 mg  tablets (total 30 mg) at the same time then resume your regularly scheduled 15 mg twice daily the next day.  If you are taking Xarelto ONCE DAILY and you miss a dose, take it as soon as you remember on the same day then continue your regularly scheduled once daily regimen the next day. Do not take two doses of Xarelto at the same time.   Important Safety Information Xarelto is a blood thinner medicine that can cause bleeding. You should call your healthcare provider right away if you experience any of the following: ? Bleeding from an injury or your nose that does not stop. ? Unusual colored urine (red or dark brown) or unusual colored stools (red or black). ? Unusual bruising for unknown reasons. ? A serious fall or if you hit your head (even if there is no bleeding).  Some medicines may interact with Xarelto and might increase your risk of bleeding while on Xarelto. To help avoid this, consult your healthcare provider or pharmacist prior to using any new prescription or non-prescription medications, including herbals, vitamins, non-steroidal anti-inflammatory drugs (NSAIDs) and supplements.  This website has more information on Xarelto: [email or web address].xarelto.com.

## 2017-12-15 NOTE — Care Management Note (Addendum)
Case Management Note  Patient Details  Name: [name] MRN: [id number] Date of Birth: 04-23-1976  Subjective/Objective:    From home alone, presents with bil PE's, on heparin will be transition to xarelto, patient has Medicaid and xarelto is a preferred drug on the Medicaid list.   NCM gave 30 day savings card to [facility] will give to patient.  NCM informed patient that co pay is 3.50 for Medicaid.                 Action/Plan: DC home when medically ready.  Expected Discharge Date:                  Expected Discharge Plan:  Home/Self Care  In-House Referral:     Discharge planning Services  CM Consult  Post Acute Care Choice:    Choice offered to:     DME Arranged:    DME Agency:     HH Arranged:    HH Agency:     Status of Service:  In process, will continue to follow  If discussed at Long Length of Stay Meetings, dates discussed:    Additional Comments:  Leone Havenaylor, Jasemine Nawaz Clinton, RN 12/15/2017, 10:03 AM

## 2017-12-15 NOTE — Progress Notes (Signed)
Patient voiced no complaints this shift.  Medications given per order.  Safety and comfort measures maintained.  Call bell within reach.  Will continue to monitor.

## 2017-12-15 NOTE — Progress Notes (Addendum)
Patient discharged to home, discharge instructions reviewed and he has no questions at this time. All belongings are with patient. VSS, pt has no c/o pain or distress at this time. Pt has Xarelto rx card with him.   1630: Patients ride home has not arrived and he will not make it in time to pick up evening dose of xarelto. Dose pulled from 2W pyxis and administered to patient.  1730: Patients ride home has not shown up, patient [facility] and [facility] secretary is assisting patient with ride home.

## 2017-12-15 NOTE — Progress Notes (Signed)
SATURATION QUALIFICATIONS: (This note is used to comply with regulatory documentation for home oxygen)  Patient Saturations on Room Air at Rest = 97%  Patient Saturations on Room Air while Ambulating = 96%  Patient Saturations on 0 Liters of oxygen while Ambulating = N/A%  Please briefly explain why patient needs home oxygen: Patient does not need home oxygen.

## 2018-04-10 ENCOUNTER — Encounter (HOSPITAL_COMMUNITY): Payer: Self-pay | Admitting: *Deleted

## 2018-04-10 ENCOUNTER — Emergency Department (HOSPITAL_COMMUNITY)
Admission: EM | Admit: 2018-04-10 | Discharge: 2018-04-10 | Disposition: A | Discharge status: Home / Self Care | Payer: Medicare Other | Attending: Emergency Medicine | Admitting: Emergency Medicine

## 2018-04-10 ENCOUNTER — Emergency Department (HOSPITAL_COMMUNITY): Payer: Medicare Other

## 2018-04-10 DIAGNOSIS — F17210 Nicotine dependence, cigarettes, uncomplicated: Secondary | ICD-10-CM | POA: Insufficient documentation

## 2018-04-10 DIAGNOSIS — L03115 Cellulitis of right lower limb: Secondary | ICD-10-CM | POA: Insufficient documentation

## 2018-04-10 DIAGNOSIS — Z7901 Long term (current) use of anticoagulants: Secondary | ICD-10-CM | POA: Diagnosis not present

## 2018-04-10 DIAGNOSIS — L03116 Cellulitis of left lower limb: Secondary | ICD-10-CM | POA: Diagnosis not present

## 2018-04-10 DIAGNOSIS — Z79899 Other long term (current) drug therapy: Secondary | ICD-10-CM | POA: Insufficient documentation

## 2018-04-10 DIAGNOSIS — Z86711 Personal history of pulmonary embolism: Secondary | ICD-10-CM | POA: Insufficient documentation

## 2018-04-10 DIAGNOSIS — I10 Essential (primary) hypertension: Secondary | ICD-10-CM | POA: Diagnosis not present

## 2018-04-10 DIAGNOSIS — R2243 Localized swelling, mass and lump, lower limb, bilateral: Secondary | ICD-10-CM | POA: Diagnosis present

## 2018-04-10 DIAGNOSIS — R0989 Other specified symptoms and signs involving the circulatory and respiratory systems: Secondary | ICD-10-CM | POA: Diagnosis not present

## 2018-04-10 DIAGNOSIS — Z590 Homelessness: Secondary | ICD-10-CM | POA: Insufficient documentation

## 2018-04-10 LAB — COMPREHENSIVE METABOLIC PANEL
ALT: 14 U/L (ref 0–44)
AST: 17 U/L (ref 15–41)
Albumin: 3.7 g/dL (ref 3.5–5.0)
Alkaline Phosphatase: 95 U/L (ref 38–126)
Anion gap: 9 (ref 5–15)
BILIRUBIN TOTAL: 0.8 mg/dL (ref 0.3–1.2)
BUN: 9 mg/dL (ref 6–20)
CHLORIDE: 104 mmol/L (ref 98–111)
CO2: 26 mmol/L (ref 22–32)
Calcium: 9.1 mg/dL (ref 8.9–10.3)
Creatinine, Ser: 1.05 mg/dL (ref 0.61–1.24)
Glucose, Bld: 115 mg/dL — ABNORMAL HIGH (ref 70–99)
POTASSIUM: 3.1 mmol/L — AB (ref 3.5–5.1)
Sodium: 139 mmol/L (ref 135–145)
TOTAL PROTEIN: 6.5 g/dL (ref 6.5–8.1)

## 2018-04-10 LAB — CBC WITH DIFFERENTIAL/PLATELET
Abs Immature Granulocytes: 0 10*3/uL (ref 0.0–0.1)
BASOS ABS: 0.1 10*3/uL (ref 0.0–0.1)
Basophils Relative: 1 %
EOS ABS: 0.3 10*3/uL (ref 0.0–0.7)
Eosinophils Relative: 4 %
HCT: 41.2 % (ref 39.0–52.0)
HEMOGLOBIN: 14 g/dL (ref 13.0–17.0)
IMMATURE GRANULOCYTES: 0 %
LYMPHS ABS: 2.5 10*3/uL (ref 0.7–4.0)
LYMPHS PCT: 29 %
MCH: 31.5 pg (ref 26.0–34.0)
MCHC: 34 g/dL (ref 30.0–36.0)
MCV: 92.6 fL (ref 78.0–100.0)
Monocytes Absolute: 0.5 10*3/uL (ref 0.1–1.0)
Monocytes Relative: 6 %
NEUTROS PCT: 60 %
Neutro Abs: 5.3 10*3/uL (ref 1.7–7.7)
Platelets: 204 10*3/uL (ref 150–400)
RBC: 4.45 MIL/uL (ref 4.22–5.81)
RDW: 13.2 % (ref 11.5–15.5)
WBC: 8.8 10*3/uL (ref 4.0–10.5)

## 2018-04-10 LAB — I-STAT CG4 LACTIC ACID, ED: LACTIC ACID, VENOUS: 2.05 mmol/L — AB (ref 0.5–1.9)

## 2018-04-10 LAB — I-STAT BETA HCG BLOOD, ED (MC, WL, AP ONLY): I-stat hCG, quantitative: <5 m[IU]/mL (ref <5)

## 2018-04-10 MED ORDER — DOXYCYCLINE HYCLATE 100 MG PO CAPS: 100.0000 mg | ORAL_CAPSULE | Freq: Two times a day (BID) | ORAL | 0 refills | Status: DC

## 2018-04-10 MED ORDER — SODIUM CHLORIDE 0.9 % IV BOLUS
1000.0000 mL | Freq: Once | INTRAVENOUS | Status: DC
  Administered 2018-04-10: 1000 mL via INTRAVENOUS

## 2018-04-10 NOTE — ED Triage Notes (Signed)
Pt in c/o bilateral foot swelling with increased swelling to his toes, color changes noted to bilateral 5th toe, redness to top of feet, hx of blood clot, stopped blood thinners

## 2018-04-10 NOTE — ED Notes (Signed)
[place] results of 2.05 [facility]

## 2018-04-10 NOTE — ED Notes (Signed)
Patient verbalizes understanding of discharge instructions. Opportunity for questioning and answers were provided. Armband removed by staff, pt discharged from ED ambulatory.   

## 2018-04-10 NOTE — Care Management Note (Signed)
Case Management Note  CM consulted for concern over pt not picking up his ABX from the pharmacy on D/C.  CM advised [place]lli, [place] that it is pt's choice to pick up his medications or not.  Pt has insurance coverage and does not qualify for medication cost assistance.  CM placed Chales AbrahamsMary Ann, NP on AVS [facility] if the pt no longer sees Dr. Parke SimmersBland.  No further CM needs noted at this time.  Maurie Olesen, Lynnae SandhoffAngela N, RN 04/10/2018, 1:26 PM

## 2018-04-10 NOTE — ED Provider Notes (Signed)
[facility] Provider Note   CSN: [id number] Arrival date & time: 04/10/18  1104     History   Chief Complaint Chief Complaint  Patient presents with  . Foot Swelling    HPI Carl GoreBrad T Harwood is a 42 y.o. male presenting with bilateral foot pain, redness and swelling.  Patient states that he woke up this morning with sharp pain wounds, redness and swelling to both his feet.  Patient describes his pain as moderate, worse with walking and palpation to his feet.  Patient denies drainage from the wounds.    Patient states that he is homeless and does not wear shoes.  Patient believes that he was bit on his feet by fleas yesterday.  Patient denies any and all symptoms prior to waking up this morning.  Patient states that he is only here to have his feet evaluated at this time.  Of note patient with admission in March for bilateral lower lobe segmental branch pulmonary emboli, unprovoked.  Patient states that he is on compliant with all of his medications. Patient denies shortness of breath, chest pain, dyspnea, fever, nausea, vomiting, diarrhea or lower leg swelling or pain.  He states that he is only having pain to his bilateral feet. HPI  Past Medical History:  Diagnosis Date  . Boxer's fracture   . Hypertension   . Schizophrenic disorder [facility](HCC)     Patient Active Problem List   Diagnosis Date Noted  . Pulmonary embolism (HCC) 12/13/2017  . Pulmonary embolism, bilateral (HCC)   . Paranoid schizophrenia (HCC)   . Cocaine abuse with cocaine-induced mood disorder (HCC) 06/30/2017  . Substance induced mood disorder (HCC) 11/29/2015  . Homeless   . Suicidal ideation   . Schizoaffective disorder, unspecified type (HCC)   . Schizoaffective disorder (HCC) 05/08/2013  . Cocaine abuse (HCC) 11/11/2011    History reviewed. No pertinent surgical history.      Home Medications    Prior to Admission medications   Medication Sig Start Date End Date  Taking? Authorizing Provider  albuterol (PROVENTIL) (2.5 MG/3ML) 0.083% nebulizer solution Inhale 3 mLs (2.5 mg total) into the lungs every 6 (six) hours as needed for wheezing or shortness of breath. 12/15/17   Shirley, [place], DO  doxycycline (VIBRAMYCIN) 100 MG capsule Take 1 capsule (100 mg total) by mouth 2 (two) times daily. 04/10/18   Harlene SaltsMorelli, Tondra Reierson A, PA-C  gabapentin (NEURONTIN) 100 MG capsule Take 2 capsules (200 mg total) by mouth 3 (three) times daily. Patient not taking: Reported on 05/31/2017 01/31/17   Charm RingsLord, Jamison Y, NP  hydrochlorothiazide (MICROZIDE) 12.5 MG capsule Take 1 capsule (12.5 mg total) by mouth daily. 12/15/17   Shirley, [place], DO  nicotine (NICODERM CQ - DOSED IN MG/24 HOURS) 21 mg/24hr patch Place 1 patch (21 mg total) onto the skin daily. 12/16/17   Shirley, [place], DO  OLANZapine zydis (ZYPREXA) 20 MG disintegrating tablet Take 1 tablet (20 mg total) by mouth at bedtime. 01/31/17   Charm RingsLord, Jamison Y, NP  Rivaroxaban (XARELTO) 15 MG TABS tablet Take 1 tablet (15 mg total) by mouth 2 (two) times daily with a meal for 21 days. 12/15/17 01/05/18  Shirley, [place], DO    Family History History reviewed. No pertinent family history.  Social History Social History   Tobacco Use  . Smoking status: Current Every Day Smoker    Packs/day: 1.00    Years: 15.00    Pack years: 15.00    Types: Cigarettes  .  Smokeless tobacco: Never Used  Substance Use Topics  . Alcohol use: No    Comment: seldom  . Drug use: No     Allergies   Haldol [haloperidol decanoate]   Review of Systems Review of Systems  Constitutional: Negative.  Negative for chills, fatigue and fever.  HENT: Negative.  Negative for congestion, ear pain, rhinorrhea, sore throat and trouble swallowing.   Eyes: Negative.  Negative for pain.  Respiratory: Negative.  Negative for cough, chest tightness and shortness of breath.   Cardiovascular: Negative.  Negative for chest pain and leg swelling.    Gastrointestinal: Negative.  Negative for abdominal pain, blood in stool, diarrhea, nausea and vomiting.  Genitourinary: Negative.  Negative for dysuria, flank pain and hematuria.  Musculoskeletal: Positive for gait problem. Negative for arthralgias, back pain, myalgias and neck pain.       Bilateral foot pain.  Skin: Positive for color change and wound. Negative for rash.  Neurological: Negative for dizziness, syncope, weakness, light-headedness and headaches.     Physical Exam Updated Vital Signs BP 114/71   Pulse 79   Temp 97.6 F (36.4 C) (Oral)   Resp 14   SpO2 94%   Physical Exam  Constitutional: He is oriented to person, place, and time. He appears well-developed and well-nourished. No distress.  HENT:  Head: Normocephalic and atraumatic.  Right Ear: External ear normal.  Left Ear: External ear normal.  Eyes: Pupils are equal, round, and reactive to light. EOM are normal.  Neck: Normal range of motion. Neck supple. No JVD present. No tracheal deviation present.  Cardiovascular: Normal rate, regular rhythm and normal heart sounds.  Pulses:      Dorsalis pedis pulses are 2+ on the right side, and 2+ on the left side.       Posterior tibial pulses are 2+ on the right side, and 2+ on the left side.  Pulmonary/Chest: Effort normal and breath sounds normal. No respiratory distress.  Abdominal: Soft. Bowel sounds are normal. He exhibits no distension. There is no tenderness. There is no rebound and no guarding.  Musculoskeletal: Normal range of motion.       Right lower leg: Normal. He exhibits no tenderness, no swelling and no edema.       Left lower leg: Normal. He exhibits no tenderness, no swelling and no edema.       Right foot: There is tenderness. There is normal range of motion, normal capillary refill and no deformity.       Left foot: There is tenderness. There is normal range of motion, normal capillary refill and no deformity.       Feet:  Feet:  Right Foot:   Protective Sensation: 5 sites tested. 5 sites sensed.  Skin Integrity: Positive for blister, skin breakdown, erythema and warmth.  Left Foot:  Protective Sensation: 5 sites tested. 5 sites sensed.  Skin Integrity: Positive for blister, skin breakdown, erythema and warmth.  Neurological: He is alert and oriented to person, place, and time. He has normal strength. No sensory deficit.  Skin: Skin is warm and dry.  Psychiatric: He has a normal mood and affect. His behavior is normal.     ED Treatments / Results  Labs (all labs ordered are listed, but only abnormal results are displayed) Labs Reviewed  COMPREHENSIVE METABOLIC PANEL - Abnormal; Notable for the following components:      Result Value   Potassium 3.1 (*)    Glucose, Bld 115 (*)    All other  components within normal limits  I-STAT CG4 LACTIC ACID, ED - Abnormal; Notable for the following components:   Lactic Acid, Venous 2.05 (*)    All other components within normal limits  CBC WITH DIFFERENTIAL/PLATELET  URINALYSIS, ROUTINE W REFLEX MICROSCOPIC  I-STAT CG4 LACTIC ACID, ED  I-STAT BETA HCG BLOOD, ED ([facility], [facility], [facility] ONLY)    EKG None  Radiology Dg Chest 2 View  Result Date: 04/10/2018 CLINICAL DATA:  Bilateral foot swelling. EXAM: CHEST - 2 VIEW COMPARISON:  Chest x-ray and CTA chest dated December 13, 2017. FINDINGS: The heart size and mediastinal contours are within normal limits. Normal pulmonary vascularity. No focal consolidation, pleural effusion, or pneumothorax. No acute osseous abnormality. IMPRESSION: No active cardiopulmonary disease. Electronically Signed   By: Obie Dredge M.D.   On: 04/10/2018 12:15    Procedures Procedures (including critical care time)  Medications Ordered in ED Medications - No data to display   Initial Impression / Assessment and Plan / ED Course  I have reviewed the triage vital signs and the nursing notes.  Pertinent labs & imaging results that were available during my care of  the patient were reviewed by me and considered in my medical decision making (see chart for details).    Patient with multiple small wounds to his feet bilaterally, surrounding erythema, increased warmth, moderate amount of swelling and streaking present.  Patient tender to palpation of feet bilaterally.  Pedal pulses intact, sensation intact, movement intact. Patient is afebrile, not tachycardic, no other complaints at this time.  Patient does have a lactate level of 2.05.  I have ordered IV fluids.  Patient seen and evaluated by Dr. Rhunette Croft who suggests discharge at this time with outpatient treatment with doxycycline.  I have consulted case management, they state that the patient is insured and has the ability to pick up his antibiotics. I have consulted with social work who are providing the patient with shoes.  Patient presentation consistent with cellulitis. Patient is afebrile. No tachycardia, hypotension or other symptoms suggestive of severe infection. Patient advised to follow up for wound check in 2-3 days or sooner for worsening systemic symptoms, new lymphangitis, or significant spread of erythema. Will discharge with doxycycline. Return precautions discussed at length. Patient's mildly elevated lactic acid level discussed with Dr. Rhunette Croft, I have given the patient a liter of fluids here in department patient. Patient appears stable for discharge, vital signs within normal limits.  Patient's with no signs of suggestive of urinary embolism this time, denies shortness of breath, dyspnea on exertion, fatigue, cough, hemoptysis, lower leg swelling tenderness he was.  At this time there does not appear to be any evidence of an acute emergency medical condition and the patient appears stable for discharge with appropriate outpatient follow up. Diagnosis was discussed with patient who verbalizes understanding and is agreeable to discharge. I have discussed return precautions with patients who  verbalizes understanding of return precautions. Patient strongly encouraged to follow-up with their PCP.  Patient's case discussed with Dr. Rhunette Croft who agrees with plan to discharge with follow-up.     This note was dictated using DragonOne dictation software; please contact for any inconsistencies within the note.   Final Clinical Impressions(s) / ED Diagnoses   Final diagnoses:  Cellulitis of right foot  Cellulitis of left foot    ED Discharge Orders        Ordered    doxycycline (VIBRAMYCIN) 100 MG capsule  2 times daily     04/10/18 1454  Bill Salinas, PA-C 04/10/18 1700    Derwood Kaplan, MD 04/13/18 [phone]

## 2018-04-10 NOTE — Discharge Instructions (Addendum)
Please take all of your antibiotic, doxycycline, as prescribed; this is very important to avoid worsening infection. Please be sure to wear the shoes that were given to you today and change your socks often. [name]. Please follow-up with your primary care provider regarding your visit today. Please return to the emergency department for any new or worsening symptoms.  Contact a doctor if: You have a fever. Your symptoms do not get better after 1-2 days of treatment. Your bone or joint under the infected area starts to hurt after the skin has healed. Your infection comes back. This can happen in the same area or another area. You have a swollen bump in the infected area. You have new symptoms. You feel ill and also have muscle aches and pains. Get help right away if: Your symptoms get worse. You feel very sleepy. You throw up (vomit) or have watery poop (diarrhea) for a long time. There are red streaks coming from the infected area. Your red area gets larger. Your red area turns darker.

## 2018-04-10 NOTE — Progress Notes (Addendum)
CSW following up on request to obtain shoes.  Update: CSW was unable to locate shoes for pt. Pt size 12. CSW went to meet with pt in waiting room, unable to locate.    Montine CircleKelsy Eldene Plocher, Silverio LayLCSWA [facility]  [phone]

## 2018-04-10 NOTE — ED Notes (Addendum)
Pt requesting "schizophrenia shot" because he has not had his monthly shot from [facility] in 2 months. Pt is also not taking any blood thinners. Blue top blood specimen tube sent down in case add-on. Pt informed urine sample is needed; states "I just did some coke if yall need to know that." Pt placed on cardiac monitor, NSR, while this RN was placing cardiac monitor on pt, pt developed an erection. Pt transported to xray at this time.

## 2018-04-10 NOTE — ED Notes (Addendum)
Patient ambulatory to bathroom with steady gait at this time 

## 2018-04-13 ENCOUNTER — Emergency Department (HOSPITAL_COMMUNITY)
Admission: EM | Admit: 2018-04-13 | Discharge: 2018-04-13 | Disposition: A | Discharge status: Home / Self Care | Payer: Medicare Other | Attending: Emergency Medicine | Admitting: Emergency Medicine

## 2018-04-13 ENCOUNTER — Encounter (HOSPITAL_COMMUNITY): Payer: Self-pay | Admitting: Emergency Medicine

## 2018-04-13 DIAGNOSIS — Z5321 Procedure and treatment not carried out due to patient leaving prior to being seen by health care provider: Secondary | ICD-10-CM | POA: Insufficient documentation

## 2018-04-13 DIAGNOSIS — F99 Mental disorder, not otherwise specified: Secondary | ICD-10-CM | POA: Insufficient documentation
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
## 2018-04-15 ENCOUNTER — Emergency Department (HOSPITAL_COMMUNITY)
Admission: EM | Admit: 2018-04-15 | Discharge: 2018-04-16 | Disposition: A | Discharge status: Home / Self Care | Payer: Medicare Other | Attending: Emergency Medicine | Admitting: Emergency Medicine

## 2018-04-15 ENCOUNTER — Encounter (HOSPITAL_COMMUNITY): Payer: Self-pay | Admitting: Emergency Medicine

## 2018-04-15 ENCOUNTER — Other Ambulatory Visit: Payer: Self-pay

## 2018-04-15 DIAGNOSIS — I10 Essential (primary) hypertension: Secondary | ICD-10-CM | POA: Diagnosis not present

## 2018-04-15 DIAGNOSIS — F1420 Cocaine dependence, uncomplicated: Secondary | ICD-10-CM | POA: Diagnosis not present

## 2018-04-15 DIAGNOSIS — F1220 Cannabis dependence, uncomplicated: Secondary | ICD-10-CM | POA: Diagnosis not present

## 2018-04-15 DIAGNOSIS — F17210 Nicotine dependence, cigarettes, uncomplicated: Secondary | ICD-10-CM | POA: Diagnosis not present

## 2018-04-15 DIAGNOSIS — F1910 Other psychoactive substance abuse, uncomplicated: Secondary | ICD-10-CM | POA: Diagnosis not present

## 2018-04-15 DIAGNOSIS — Z79899 Other long term (current) drug therapy: Secondary | ICD-10-CM | POA: Diagnosis not present

## 2018-04-15 DIAGNOSIS — F250 Schizoaffective disorder, bipolar type: Secondary | ICD-10-CM | POA: Diagnosis not present

## 2018-04-15 DIAGNOSIS — F29 Unspecified psychosis not due to a substance or known physiological condition: Secondary | ICD-10-CM | POA: Diagnosis present

## 2018-04-15 DIAGNOSIS — F259 Schizoaffective disorder, unspecified: Secondary | ICD-10-CM

## 2018-04-15 DIAGNOSIS — E876 Hypokalemia: Secondary | ICD-10-CM | POA: Diagnosis not present

## 2018-04-15 LAB — COMPREHENSIVE METABOLIC PANEL
ALT: 15 U/L (ref 0–44)
AST: 22 U/L (ref 15–41)
Albumin: 4 g/dL (ref 3.5–5.0)
Alkaline Phosphatase: 111 U/L (ref 38–126)
Anion gap: 13 (ref 5–15)
BUN: 6 mg/dL (ref 6–20)
CHLORIDE: 101 mmol/L (ref 98–111)
CO2: 24 mmol/L (ref 22–32)
Calcium: 9.1 mg/dL (ref 8.9–10.3)
Creatinine, Ser: 1.11 mg/dL (ref 0.61–1.24)
GFR calc Af Amer: >60 mL/min (ref >60)
Glucose, Bld: 165 mg/dL — ABNORMAL HIGH (ref 70–99)
Potassium: 2.8 mmol/L — ABNORMAL LOW (ref 3.5–5.1)
Sodium: 138 mmol/L (ref 135–145)
Total Bilirubin: 1 mg/dL (ref 0.3–1.2)
Total Protein: 7.1 g/dL (ref 6.5–8.1)

## 2018-04-15 LAB — RAPID URINE DRUG SCREEN, HOSP PERFORMED
AMPHETAMINES: NOT DETECTED
Barbiturates: NOT DETECTED
Benzodiazepines: NOT DETECTED
Cocaine: POSITIVE — AB
Opiates: NOT DETECTED
Tetrahydrocannabinol: POSITIVE — AB

## 2018-04-15 LAB — CBC
HCT: 44.4 % (ref 39.0–52.0)
Hemoglobin: 15 g/dL (ref 13.0–17.0)
MCH: 31.2 pg (ref 26.0–34.0)
MCHC: 33.8 g/dL (ref 30.0–36.0)
MCV: 92.3 fL (ref 78.0–100.0)
PLATELETS: 207 10*3/uL (ref 150–400)
RBC: 4.81 MIL/uL (ref 4.22–5.81)
RDW: 13.3 % (ref 11.5–15.5)
WBC: 8.9 10*3/uL (ref 4.0–10.5)

## 2018-04-15 LAB — ACETAMINOPHEN LEVEL

## 2018-04-15 LAB — SALICYLATE LEVEL

## 2018-04-15 LAB — ETHANOL

## 2018-04-15 MED ORDER — POTASSIUM CHLORIDE CRYS ER 20 MEQ PO TBCR
80.0000 meq | EXTENDED_RELEASE_TABLET | Freq: Once | ORAL | Status: AC
  Administered 2018-04-16: 80 meq via ORAL
  Filled 2018-04-15: qty 4
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
## 2018-04-16 ENCOUNTER — Other Ambulatory Visit: Payer: Self-pay

## 2018-04-16 ENCOUNTER — Encounter (HOSPITAL_COMMUNITY): Payer: Self-pay | Admitting: Emergency Medicine

## 2018-04-16 DIAGNOSIS — F250 Schizoaffective disorder, bipolar type: Secondary | ICD-10-CM | POA: Diagnosis not present

## 2018-04-16 MED ORDER — ZOLPIDEM TARTRATE 5 MG PO TABS: 5.0000 mg | ORAL_TABLET | Freq: Every evening | ORAL | Status: DC | PRN

## 2018-04-16 MED ORDER — POTASSIUM CHLORIDE CRYS ER 20 MEQ PO TBCR: 20.0000 meq | EXTENDED_RELEASE_TABLET | Freq: Every day | ORAL | 0 refills | Status: DC

## 2018-04-16 MED ORDER — ALUM & MAG HYDROXIDE-SIMETH 200-200-20 MG/5ML PO SUSP: 30.0000 mL | Freq: Four times a day (QID) | ORAL | Status: DC | PRN

## 2018-04-16 MED ORDER — ALBUTEROL SULFATE (2.5 MG/3ML) 0.083% IN NEBU: 2.5000 mg | INHALATION_SOLUTION | Freq: Four times a day (QID) | RESPIRATORY_TRACT | Status: DC | PRN

## 2018-04-16 MED ORDER — ONDANSETRON HCL 4 MG PO TABS: 4.0000 mg | ORAL_TABLET | Freq: Three times a day (TID) | ORAL | Status: DC | PRN

## 2018-04-16 MED ORDER — NICOTINE 21 MG/24HR TD PT24: 21.0000 mg | MEDICATED_PATCH | Freq: Every day | TRANSDERMAL | Status: DC

## 2018-04-16 MED ORDER — OLANZAPINE 5 MG PO TBDP
20.0000 mg | ORAL_TABLET | Freq: Every day | ORAL | Status: DC
  Administered 2018-04-16: 20 mg via ORAL
  Filled 2018-04-16: qty 4

## 2018-04-16 MED ORDER — ACETAMINOPHEN 325 MG PO TABS: 650.0000 mg | ORAL_TABLET | ORAL | Status: DC | PRN

## 2018-04-16 NOTE — ED Notes (Signed)
Pt given turkey sandwich and sprite.  

## 2018-04-16 NOTE — ED Notes (Addendum)
ALL belongings - 1 labeled belongings bag - NO valuables envelope noted - returned to pt - Pt signed verifying all items present. Pt given Outpt Resources - discussed w/pt - verbalized understanding. Pt called someone and advised them of him being discharged from hospital.

## 2018-04-16 NOTE — ED Provider Notes (Signed)
Patient has been cleared by behavioral health for discharge.  Review of patient's EMR indicates he had a potassium of 2.8 which was supplemented in the emergency department.  Patient was advised of low potassium which is likely secondary to hydrochlorothiazide.  He voiced understanding.  I advised that the potassium needed close monitoring as high potassium is a dangerous condition.  He advises he will be able to schedule follow-up with Dr. Parke SimmersBland for continue monitoring of his potassium and blood pressure.   Arby BarrettePfeiffer, Joandy Burget, MD 04/16/18 1331

## 2018-04-16 NOTE — ED Notes (Signed)
TTS in progress 

## 2018-04-16 NOTE — ED Notes (Signed)
Pt stuffed inventoried and placed in locker 5
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
## 2018-04-16 NOTE — ED Notes (Signed)
Lunch ordered 

## 2018-04-16 NOTE — Progress Notes (Signed)
Patient is seen by me today via tele-psych and I have consulted with Dr. Lucianne MussKumar.  Patient has a long history of coming to the ED after cocaine use.  Just before evaluation patient had told the ED RN that he could not remember why he came to the emergency room.  At the beginning of the tele-psych patient stated I remember why came it was because I am suicidal and homicidal and I get hallucinations.  Patient continued to answer questions with I do not know and would not clarify his answers.  Patient [facility] and [facility] those.  Patient does not meet criteria for inpatient.  Patient never stated he was SI/HI/AVH currently.  Patient did state that he has some hallucinations from time to time.  Patient was instructed to stop using cocaine and to follow-up with his outpatient psychiatric provider.  I [facility]feifer [facility] and notified [facility] of our recommendations.

## 2018-04-16 NOTE — ED Notes (Signed)
Pt now awake - ate lunch - ambulated to bathroom and back to room w/o difficulty then returned to lying on bed. RN asked pt what brought him to ED - states "I've already told 5 people". Pt paused then states "I forgot now".

## 2018-04-16 NOTE — Discharge Instructions (Addendum)
1.  Use the follow-up resources provided by the behavioral health counselor for mental health and substance abuse.  Make an appointment as soon as possible. 2.  Return to the emergency department if you have thoughts of hurting yourself or anyone else. 3.  Also make a follow-up appointment with your family doctor for medical care as soon as possible.  You must have a recheck of your potassium within the next 4 days.  Take potassium tablet once daily as prescribed.  You may also increase the potassium content in the foods you eat.

## 2018-04-16 NOTE — ED Provider Notes (Signed)
[facility] Provider Note   CSN: [id number] Arrival date & time: 04/15/18  2216     History   Chief Complaint Chief Complaint  Patient presents with  . Homicidal    HPI [name] is a 42 y.o. male.  The history is provided by the patient.  Mental Health Problem  Presenting symptoms: homicidal ideas   Presenting symptoms: no disorganized speech, no self-mutilation, no suicidal thoughts, no suicidal threats and no suicide attempt   Patient accompanied by: none. Degree of incapacity (severity):  Moderate Onset quality:  Gradual Duration:  1 month Timing:  Constant Progression:  Worsening Chronicity:  Recurrent Context: noncompliance   Treatment compliance:  Untreated Relieved by:  Nothing Worsened by:  Nothing Ineffective treatments:  None tried Associated symptoms: no abdominal pain, no anhedonia, no anxiety, no appetite change and no chest pain   Risk factors: hx of mental illness     Past Medical History:  Diagnosis Date  . Boxer's fracture   . Hypertension   . Schizophrenic disorder [facility](HCC)     Patient Active Problem List   Diagnosis Date Noted  . Pulmonary embolism (HCC) 12/13/2017  . Pulmonary embolism, bilateral (HCC)   . Paranoid schizophrenia (HCC)   . Cocaine abuse with cocaine-induced mood disorder (HCC) 06/30/2017  . Substance induced mood disorder (HCC) 11/29/2015  . Homeless   . Suicidal ideation   . Schizoaffective disorder, unspecified type (HCC)   . Schizoaffective disorder (HCC) 05/08/2013  . Cocaine abuse (HCC) 11/11/2011    History reviewed. No pertinent surgical history.      Home Medications    Prior to Admission medications   Medication Sig Start Date End Date Taking? Authorizing Provider  albuterol (PROVENTIL) (2.5 MG/3ML) 0.083% nebulizer solution Inhale 3 mLs (2.5 mg total) into the lungs every 6 (six) hours as needed for wheezing or shortness of breath. 12/15/17   Shirley, [place],  DO  doxycycline (VIBRAMYCIN) 100 MG capsule Take 1 capsule (100 mg total) by mouth 2 (two) times daily. 04/10/18   Harlene SaltsMorelli, Brandon A, PA-C  gabapentin (NEURONTIN) 100 MG capsule Take 2 capsules (200 mg total) by mouth 3 (three) times daily. Patient not taking: Reported on 05/31/2017 01/31/17   Charm RingsLord, Jamison Y, NP  hydrochlorothiazide (MICROZIDE) 12.5 MG capsule Take 1 capsule (12.5 mg total) by mouth daily. 12/15/17   Shirley, [place], DO  nicotine (NICODERM CQ - DOSED IN MG/24 HOURS) 21 mg/24hr patch Place 1 patch (21 mg total) onto the skin daily. 12/16/17   Shirley, [place], DO  OLANZapine zydis (ZYPREXA) 20 MG disintegrating tablet Take 1 tablet (20 mg total) by mouth at bedtime. 01/31/17   Charm RingsLord, Jamison Y, NP  Rivaroxaban (XARELTO) 15 MG TABS tablet Take 1 tablet (15 mg total) by mouth 2 (two) times daily with a meal for 21 days. 12/15/17 01/05/18  Shirley, [place], DO    Family History No family history on file.  Social History Social History   Tobacco Use  . Smoking status: Current Every Day Smoker    Packs/day: 1.00    Years: 15.00    Pack years: 15.00    Types: Cigarettes  . Smokeless tobacco: Never Used  Substance Use Topics  . Alcohol use: No    Comment: seldom  . Drug use: No     Allergies   Haldol [haloperidol decanoate]   Review of Systems Review of Systems  Constitutional: Negative for appetite change.  HENT: Negative for congestion and voice change.  Respiratory: Negative for shortness of breath.   Cardiovascular: Negative for chest pain.  Gastrointestinal: Negative for abdominal pain.  Genitourinary: Negative for dysuria and flank pain.  Neurological: Negative for dizziness, seizures and facial asymmetry.  Psychiatric/Behavioral: Positive for homicidal ideas. Negative for self-injury and suicidal ideas. The patient is not nervous/anxious.   All other systems reviewed and are negative.    Physical Exam Updated Vital Signs BP 115/83 (BP Location: Right Arm)    Pulse (!) 107   Temp 97.7 F (36.5 C) (Oral)   Resp 16   Ht 6' (1.829 m)   Wt 127 kg (280 lb)   SpO2 97%   BMI 37.97 kg/m   Physical Exam  Constitutional: He is oriented to person, place, and time. He appears well-developed and well-nourished. No distress.  HENT:  Head: Normocephalic and atraumatic.  Mouth/Throat: No oropharyngeal exudate.  Eyes: Pupils are equal, round, and reactive to light. Conjunctivae are normal.  Neck: Normal range of motion. Neck supple.  Cardiovascular: Normal rate, regular rhythm, normal heart sounds and intact distal pulses.  Pulmonary/Chest: Effort normal and breath sounds normal. No stridor. He has no wheezes. He has no rales.  Abdominal: Soft. Bowel sounds are normal. He exhibits no mass. There is no tenderness. There is no rebound and no guarding.  Musculoskeletal: Normal range of motion.  Neurological: He is alert and oriented to person, place, and time. He displays normal reflexes.  Skin: Skin is warm and dry. Capillary refill takes less than 2 seconds.  Psychiatric: Thought content normal.     ED Treatments / Results  Labs (all labs ordered are listed, but only abnormal results are displayed) Results for orders placed or performed during the hospital encounter of 04/15/18  Comprehensive metabolic panel  Result Value Ref Range   Sodium 138 135 - 145 mmol/L   Potassium 2.8 (L) 3.5 - 5.1 mmol/L   Chloride 101 98 - 111 mmol/L   CO2 24 22 - 32 mmol/L   Glucose, Bld 165 (H) 70 - 99 mg/dL   BUN 6 6 - 20 mg/dL   Creatinine, Ser 1.61 0.61 - 1.24 mg/dL   Calcium 9.1 8.9 - 09.6 mg/dL   Total Protein 7.1 6.5 - 8.1 g/dL   Albumin 4.0 3.5 - 5.0 g/dL   AST 22 15 - 41 U/L   ALT 15 0 - 44 U/L   Alkaline Phosphatase 111 38 - 126 U/L   Total Bilirubin 1.0 0.3 - 1.2 mg/dL   GFR calc non Af Amer >60 >60 mL/min   GFR calc Af Amer >60 >60 mL/min   Anion gap 13 5 - 15  Ethanol  Result Value Ref Range   Alcohol, Ethyl (B) <10 <10 mg/dL  Salicylate level    Result Value Ref Range   Salicylate Lvl <7.0 2.8 - 30.0 mg/dL  Acetaminophen level  Result Value Ref Range   Acetaminophen (Tylenol), Serum <10 (L) 10 - 30 ug/mL  cbc  Result Value Ref Range   WBC 8.9 4.0 - 10.5 K/uL   RBC 4.81 4.22 - 5.81 MIL/uL   Hemoglobin 15.0 13.0 - 17.0 g/dL   HCT 04.5 40.9 - 81.1 %   MCV 92.3 78.0 - 100.0 fL   MCH 31.2 26.0 - 34.0 pg   MCHC 33.8 30.0 - 36.0 g/dL   RDW 91.4 78.2 - 95.6 %   Platelets 207 150 - 400 K/uL  Rapid urine drug screen (hospital performed)  Result Value Ref Range   Opiates NONE  DETECTED NONE DETECTED   Cocaine POSITIVE (A) NONE DETECTED   Benzodiazepines NONE DETECTED NONE DETECTED   Amphetamines NONE DETECTED NONE DETECTED   Tetrahydrocannabinol POSITIVE (A) NONE DETECTED   Barbiturates NONE DETECTED NONE DETECTED   Dg Chest 2 View  Result Date: 04/10/2018 CLINICAL DATA:  Bilateral foot swelling. EXAM: CHEST - 2 VIEW COMPARISON:  Chest x-ray and CTA chest dated December 13, 2017. FINDINGS: The heart size and mediastinal contours are within normal limits. Normal pulmonary vascularity. No focal consolidation, pleural effusion, or pneumothorax. No acute osseous abnormality. IMPRESSION: No active cardiopulmonary disease. Electronically Signed   By: Obie Dredge M.D.   On: 04/10/2018 12:15    EKG None  Radiology No results found.  Procedures Procedures (including critical care time)  Medications Ordered in ED Medications  potassium chloride SA (K-DUR,KLOR-CON) CR tablet 80 mEq (has no administration in time range)       Final Clinical Impressions(s) / ED Diagnoses   Medically cleared by me for psychiatry.  Holding orders placed disposition per that service   Elsi Stelzer, MD 04/16/18 0147

## 2018-05-15 DIAGNOSIS — Z79899 Other long term (current) drug therapy: Secondary | ICD-10-CM | POA: Diagnosis not present

## 2018-05-17 DIAGNOSIS — Z79899 Other long term (current) drug therapy: Secondary | ICD-10-CM | POA: Diagnosis not present

## 2018-05-19 DIAGNOSIS — Z79899 Other long term (current) drug therapy: Secondary | ICD-10-CM | POA: Diagnosis not present

## 2018-05-24 DIAGNOSIS — Z79899 Other long term (current) drug therapy: Secondary | ICD-10-CM | POA: Diagnosis not present

## 2018-06-01 DIAGNOSIS — Z79899 Other long term (current) drug therapy: Secondary | ICD-10-CM | POA: Diagnosis not present

## 2018-06-05 DIAGNOSIS — Z79899 Other long term (current) drug therapy: Secondary | ICD-10-CM | POA: Diagnosis not present

## 2018-06-07 DIAGNOSIS — Z79899 Other long term (current) drug therapy: Secondary | ICD-10-CM | POA: Diagnosis not present

## 2018-06-12 ENCOUNTER — Emergency Department (HOSPITAL_COMMUNITY)
Admission: EM | Admit: 2018-06-12 | Discharge: 2018-06-12 | Disposition: A | Discharge status: Home / Self Care | Payer: Medicare Other | Attending: Emergency Medicine | Admitting: Emergency Medicine

## 2018-06-12 ENCOUNTER — Other Ambulatory Visit: Payer: Self-pay

## 2018-06-12 ENCOUNTER — Encounter (HOSPITAL_COMMUNITY): Payer: Self-pay | Admitting: *Deleted

## 2018-06-12 DIAGNOSIS — Z79899 Other long term (current) drug therapy: Secondary | ICD-10-CM | POA: Insufficient documentation

## 2018-06-12 DIAGNOSIS — R110 Nausea: Secondary | ICD-10-CM | POA: Diagnosis not present

## 2018-06-12 DIAGNOSIS — R197 Diarrhea, unspecified: Secondary | ICD-10-CM | POA: Diagnosis not present

## 2018-06-12 DIAGNOSIS — I10 Essential (primary) hypertension: Secondary | ICD-10-CM | POA: Insufficient documentation

## 2018-06-12 DIAGNOSIS — F17210 Nicotine dependence, cigarettes, uncomplicated: Secondary | ICD-10-CM | POA: Insufficient documentation

## 2018-06-12 LAB — CBC WITH DIFFERENTIAL/PLATELET
Abs Immature Granulocytes: 0.1 10*3/uL (ref 0.0–0.1)
Basophils Absolute: 0.1 10*3/uL (ref 0.0–0.1)
Basophils Relative: 1 %
Eosinophils Absolute: 0.5 10*3/uL (ref 0.0–0.7)
Eosinophils Relative: 5 %
HCT: 44.1 % (ref 39.0–52.0)
Hemoglobin: 15.1 g/dL (ref 13.0–17.0)
Immature Granulocytes: 1 %
Lymphocytes Relative: 30 %
Lymphs Abs: 2.6 10*3/uL (ref 0.7–4.0)
MCH: 32 pg (ref 26.0–34.0)
MCHC: 34.2 g/dL (ref 30.0–36.0)
MCV: 93.4 fL (ref 78.0–100.0)
Monocytes Absolute: 0.5 10*3/uL (ref 0.1–1.0)
Monocytes Relative: 6 %
Neutro Abs: 5 10*3/uL (ref 1.7–7.7)
Neutrophils Relative %: 57 %
Platelets: 195 10*3/uL (ref 150–400)
RBC: 4.72 MIL/uL (ref 4.22–5.81)
RDW: 12.8 % (ref 11.5–15.5)
WBC: 8.7 10*3/uL (ref 4.0–10.5)

## 2018-06-12 LAB — COMPREHENSIVE METABOLIC PANEL
ALT: 22 U/L (ref 0–44)
AST: 19 U/L (ref 15–41)
Albumin: 3.9 g/dL (ref 3.5–5.0)
Alkaline Phosphatase: 100 U/L (ref 38–126)
Anion gap: 9 (ref 5–15)
BUN: <5 mg/dL — ABNORMAL LOW (ref 6–20)
CO2: 28 mmol/L (ref 22–32)
Calcium: 9 mg/dL (ref 8.9–10.3)
Chloride: 102 mmol/L (ref 98–111)
Creatinine, Ser: 0.76 mg/dL (ref 0.61–1.24)
GFR calc Af Amer: >60 mL/min (ref >60)
GFR calc non Af Amer: >60 mL/min (ref >60)
Glucose, Bld: 98 mg/dL (ref 70–99)
Potassium: 3.6 mmol/L (ref 3.5–5.1)
Sodium: 139 mmol/L (ref 135–145)
Total Bilirubin: 0.8 mg/dL (ref 0.3–1.2)
Total Protein: 6.9 g/dL (ref 6.5–8.1)

## 2018-06-12 LAB — LIPASE, BLOOD: Lipase: 28 U/L (ref 11–51)

## 2018-06-12 MED ORDER — ONDANSETRON HCL 4 MG PO TABS
4.0000 mg | ORAL_TABLET | Freq: Once | ORAL | Status: AC
Start: 2018-06-12 — End: 2018-06-12
  Administered 2018-06-12: 4 mg via ORAL
  Filled 2018-06-12: qty 1

## 2018-06-12 MED ORDER — ONDANSETRON HCL 4 MG PO TABS: 4.0000 mg | ORAL_TABLET | Freq: Four times a day (QID) | ORAL | 0 refills | Status: DC

## 2018-06-12 NOTE — ED Notes (Signed)
Patient verbalizes understanding of discharge instructions. Opportunity for questioning and answers were provided. Armband removed by staff, pt discharged from ED ambulatory.   

## 2018-06-12 NOTE — ED Provider Notes (Signed)
[facility] EM[facility]GENCY [facility]PART[facility] Provider Note   CSN: [id number] Arrival date & time: 06/12/18  1333     History   Chief Complaint Chief Complaint  Patient presents with  . Nausea    HPI [name] is a 42 y.o. male.  HPI   42 year old male presents today with complaints of nausea.  Patient notes that he was eating food yesterday given to him by another person at his treatment facility.  He feels that he had placed some substance on it.  Patient notes immediate nausea and minimal epigastric abdominal discomfort after.  He notes several episodes of diarrhea today, no vomiting, no fever, no severe abdominal pain.  Patient denies any other complaints here today.    Past Medical History:  Diagnosis Date  . Boxer's fracture   . Hypertension   . Schizophrenic disorder [facility](HCC)     Patient Active Problem List   Diagnosis Date Noted  . Pulmonary embolism (HCC) 12/13/2017  . Pulmonary embolism, bilateral (HCC)   . Paranoid schizophrenia (HCC)   . Cocaine abuse with cocaine-induced mood disorder (HCC) 06/30/2017  . Substance induced mood disorder (HCC) 11/29/2015  . Homeless   . Suicidal ideation   . Schizoaffective disorder, unspecified type (HCC)   . Schizoaffective disorder (HCC) 05/08/2013  . Cocaine abuse (HCC) 11/11/2011    History reviewed. No pertinent surgical history.      Home Medications    Prior to Admission medications   Medication Sig Start Date End Date Taking? Authorizing Provider  albuterol (PROVENTIL) (2.5 MG/3ML) 0.083% nebulizer solution Inhale 3 mLs (2.5 mg total) into the lungs every 6 (six) hours as needed for wheezing or shortness of breath. 12/15/17   Shirley, [place], DO  doxycycline (VIBRAMYCIN) 100 MG capsule Take 1 capsule (100 mg total) by mouth 2 (two) times daily. Patient not taking: Reported on 04/16/2018 04/10/18   Harlene SaltsMorelli, Brandon A, PA-C  gabapentin (NEURONTIN) 100 MG capsule Take 2 capsules (200 mg total) by mouth 3  (three) times daily. Patient taking differently: Take 800 mg by mouth at bedtime.  01/31/17   Charm RingsLord, Jamison Y, NP  hydrochlorothiazide (MICROZIDE) 12.5 MG capsule Take 1 capsule (12.5 mg total) by mouth daily. Patient not taking: Reported on 04/16/2018 12/15/17   Shirley, [place], DO  nicotine (NICODERM CQ - DOSED IN MG/24 HOURS) 21 mg/24hr patch Place 1 patch (21 mg total) onto the skin daily. Patient taking differently: Place 21 mg onto the skin daily as needed (smoking cessation).  12/16/17   Shirley, [place], DO  OLANZapine zydis (ZYPREXA) 20 MG disintegrating tablet Take 1 tablet (20 mg total) by mouth at bedtime. 01/31/17   Charm RingsLord, Jamison Y, NP  ondansetron (ZOFRAN) 4 MG tablet Take 1 tablet (4 mg total) by mouth every 6 (six) hours. 06/12/18   Deakon Frix, Tinnie GensJeffrey, PA-C  potassium chloride SA (K-DUR,KLOR-CON) 20 MEQ tablet Take 1 tablet (20 mEq total) by mouth daily. 04/16/18   Arby BarrettePfeiffer, Marcy, MD  Rivaroxaban (XARELTO) 15 MG TABS tablet Take 1 tablet (15 mg total) by mouth 2 (two) times daily with a meal for 21 days. Patient not taking: Reported on 04/16/2018 12/15/17 01/05/18  Shirley, [place], DO    Family History History reviewed. No pertinent family history.  Social History Social History   Tobacco Use  . Smoking status: Current Every Day Smoker    Packs/day: 1.00    Years: 15.00    Pack years: 15.00    Types: Cigarettes  . Smokeless tobacco: Never Used  Substance Use Topics  . Alcohol use: No    Comment: seldom  . Drug use: No     Allergies   Haldol [haloperidol decanoate]   Review of Systems Review of Systems  All other systems reviewed and are negative.  Physical Exam Updated Vital Signs BP (!) 149/98 (BP Location: Right Arm)   Pulse (!) 115   Temp (!) 97 F (36.1 C) (Oral)   Resp 20   SpO2 98%   Physical Exam  Constitutional: He is oriented to person, place, and time. He appears well-developed and well-nourished.  HENT:  Head: Normocephalic and atraumatic.  Eyes:  Pupils are equal, round, and reactive to light. Conjunctivae are normal. Right eye exhibits no discharge. Left eye exhibits no discharge. No scleral icterus.  Neck: Normal range of motion. No JVD present. No tracheal deviation present.  Pulmonary/Chest: Effort normal. No stridor.  Abdominal: Soft. He exhibits no distension and no mass. There is no tenderness. There is no rebound and no guarding. No hernia.  Neurological: He is alert and oriented to person, place, and time. Coordination normal.  Psychiatric: He has a normal mood and affect. His behavior is normal. Judgment and thought content normal.  Nursing note and vitals reviewed.    ED Treatments / Results  Labs (all labs ordered are listed, but only abnormal results are displayed) Labs Reviewed  COMPREHENSIVE METABOLIC PANEL - Abnormal; Notable for the following components:      Result Value   BUN <5 (*)    All other components within normal limits  CBC WITH DIFFERENTIAL/PLATELET  LIPASE, BLOOD    EKG None  Radiology No results found.  Procedures Procedures (including critical care time)  Medications Ordered in ED Medications  ondansetron (ZOFRAN) tablet 4 mg (4 mg Oral Given 06/12/18 1904)     Initial Impression / Assessment and Plan / ED Course  I have reviewed the triage vital signs and the nursing notes.  Pertinent labs & imaging results that were available during my care of the patient were reviewed by me and considered in my medical decision making (see chart for details).    Labs: CBC, CMP, lipase  Imaging:  Consults:  Therapeutics: Zofran  Discharge Meds: Zofran  Assessment/Plan: 42 year old male presents today with complaints of nausea.  Patient is well-appearing in no acute distress, no vomiting.  Reassuring laboratory analysis.  Patient improved after Zofran here discharged with outpatient follow-up and strict return precautions.  He verbalized understanding and agreement to today's plan.   Final  Clinical Impressions(s) / ED Diagnoses   Final diagnoses:  Nausea    ED Discharge Orders         Ordered    ondansetron (ZOFRAN) 4 MG tablet  Every 6 hours     06/12/18 1910           Eyvonne Mechanic, PA-C 06/12/18 1912    Sabas Sous, MD 06/12/18 1921

## 2018-06-12 NOTE — ED Triage Notes (Signed)
Pt in c/o nausea since eating yesterday, is concerned he has food poisoning, denies vomiting, no distress noted

## 2018-06-12 NOTE — Discharge Instructions (Signed)
Please read attached information. If you experience any new or worsening signs or symptoms please return to the emergency room for evaluation. Please follow-up with your primary care provider or specialist as discussed. Please use medication prescribed only as directed and discontinue taking if you have any concerning signs or symptoms.   °

## 2018-06-14 DIAGNOSIS — Z79899 Other long term (current) drug therapy: Secondary | ICD-10-CM | POA: Diagnosis not present

## 2018-06-19 DIAGNOSIS — Z79899 Other long term (current) drug therapy: Secondary | ICD-10-CM | POA: Diagnosis not present

## 2018-06-21 DIAGNOSIS — Z79899 Other long term (current) drug therapy: Secondary | ICD-10-CM | POA: Diagnosis not present

## 2018-06-26 DIAGNOSIS — Z79899 Other long term (current) drug therapy: Secondary | ICD-10-CM | POA: Diagnosis not present

## 2018-06-28 DIAGNOSIS — Z79899 Other long term (current) drug therapy: Secondary | ICD-10-CM | POA: Diagnosis not present

## 2018-07-03 DIAGNOSIS — Z79899 Other long term (current) drug therapy: Secondary | ICD-10-CM | POA: Diagnosis not present

## 2018-07-05 DIAGNOSIS — Z79899 Other long term (current) drug therapy: Secondary | ICD-10-CM | POA: Diagnosis not present

## 2018-07-10 DIAGNOSIS — Z79899 Other long term (current) drug therapy: Secondary | ICD-10-CM | POA: Diagnosis not present

## 2018-07-12 DIAGNOSIS — Z79899 Other long term (current) drug therapy: Secondary | ICD-10-CM | POA: Diagnosis not present

## 2018-08-14 ENCOUNTER — Ambulatory Visit: Payer: Medicare Other | Attending: Family Medicine

## 2018-08-14 DIAGNOSIS — R110 Nausea: Secondary | ICD-10-CM | POA: Insufficient documentation

## 2018-08-14 DIAGNOSIS — Z79899 Other long term (current) drug therapy: Secondary | ICD-10-CM | POA: Diagnosis not present

## 2018-08-14 DIAGNOSIS — F29 Unspecified psychosis not due to a substance or known physiological condition: Secondary | ICD-10-CM | POA: Diagnosis not present

## 2018-08-14 NOTE — Progress Notes (Signed)
Patient here for lab visit  

## 2018-10-05 DIAGNOSIS — A638 Other specified predominantly sexually transmitted diseases: Secondary | ICD-10-CM | POA: Diagnosis not present

## 2018-10-05 DIAGNOSIS — Z7251 High risk heterosexual behavior: Secondary | ICD-10-CM | POA: Diagnosis not present

## 2018-10-16 DIAGNOSIS — J069 Acute upper respiratory infection, unspecified: Secondary | ICD-10-CM | POA: Diagnosis not present

## 2018-10-22 ENCOUNTER — Encounter (HOSPITAL_COMMUNITY): Payer: Self-pay

## 2018-10-22 ENCOUNTER — Emergency Department (HOSPITAL_COMMUNITY)
Admission: EM | Admit: 2018-10-22 | Discharge: 2018-10-23 | Disposition: A | Discharge status: Home / Self Care | Payer: Medicare Other | Attending: Emergency Medicine | Admitting: Emergency Medicine

## 2018-10-22 ENCOUNTER — Emergency Department (HOSPITAL_COMMUNITY): Payer: Medicare Other

## 2018-10-22 ENCOUNTER — Other Ambulatory Visit: Payer: Self-pay

## 2018-10-22 DIAGNOSIS — R079 Chest pain, unspecified: Secondary | ICD-10-CM | POA: Diagnosis not present

## 2018-10-22 DIAGNOSIS — R0602 Shortness of breath: Secondary | ICD-10-CM | POA: Diagnosis not present

## 2018-10-22 DIAGNOSIS — R0902 Hypoxemia: Secondary | ICD-10-CM | POA: Diagnosis not present

## 2018-10-22 DIAGNOSIS — F1410 Cocaine abuse, uncomplicated: Secondary | ICD-10-CM | POA: Insufficient documentation

## 2018-10-22 DIAGNOSIS — F17210 Nicotine dependence, cigarettes, uncomplicated: Secondary | ICD-10-CM | POA: Diagnosis not present

## 2018-10-22 DIAGNOSIS — R000 Tachycardia, unspecified: Secondary | ICD-10-CM | POA: Diagnosis not present

## 2018-10-22 DIAGNOSIS — J449 Chronic obstructive pulmonary disease, unspecified: Secondary | ICD-10-CM | POA: Diagnosis not present

## 2018-10-22 DIAGNOSIS — Z590 Homelessness: Secondary | ICD-10-CM | POA: Insufficient documentation

## 2018-10-22 DIAGNOSIS — R0789 Other chest pain: Secondary | ICD-10-CM | POA: Diagnosis not present

## 2018-10-22 DIAGNOSIS — I10 Essential (primary) hypertension: Secondary | ICD-10-CM | POA: Insufficient documentation

## 2018-10-22 DIAGNOSIS — J40 Bronchitis, not specified as acute or chronic: Secondary | ICD-10-CM | POA: Insufficient documentation

## 2018-10-22 HISTORY — DX: Chronic obstructive pulmonary disease, unspecified: J44.9

## 2018-10-22 LAB — BASIC METABOLIC PANEL
ANION GAP: 11 (ref 5–15)
BUN: 7 mg/dL (ref 6–20)
CO2: 32 mmol/L (ref 22–32)
Calcium: 9.2 mg/dL (ref 8.9–10.3)
Chloride: 94 mmol/L — ABNORMAL LOW (ref 98–111)
Creatinine, Ser: 0.81 mg/dL (ref 0.61–1.24)
GFR calc Af Amer: >60 mL/min (ref >60)
Glucose, Bld: 119 mg/dL — ABNORMAL HIGH (ref 70–99)
Potassium: 3.8 mmol/L (ref 3.5–5.1)
SODIUM: 137 mmol/L (ref 135–145)

## 2018-10-22 LAB — CBC
HCT: 46.7 % (ref 39.0–52.0)
Hemoglobin: 15.4 g/dL (ref 13.0–17.0)
MCH: 31.7 pg (ref 26.0–34.0)
MCHC: 33 g/dL (ref 30.0–36.0)
MCV: 96.1 fL (ref 80.0–100.0)
NRBC: 0 % (ref 0.0–0.2)
Platelets: 192 10*3/uL (ref 150–400)
RBC: 4.86 MIL/uL (ref 4.22–5.81)
RDW: 12.7 % (ref 11.5–15.5)
WBC: 11.1 10*3/uL — AB (ref 4.0–10.5)

## 2018-10-22 LAB — HEPATIC FUNCTION PANEL
ALBUMIN: 3.6 g/dL (ref 3.5–5.0)
ALT: 16 U/L (ref 0–44)
AST: 16 U/L (ref 15–41)
Alkaline Phosphatase: 83 U/L (ref 38–126)
Bilirubin, Direct: <0.1 mg/dL (ref 0.0–0.2)
Total Bilirubin: 0.5 mg/dL (ref 0.3–1.2)
Total Protein: 7 g/dL (ref 6.5–8.1)

## 2018-10-22 LAB — I-STAT TROPONIN, ED: TROPONIN I, POC: 0.01 ng/mL (ref 0.00–0.08)

## 2018-10-22 LAB — LIPASE, BLOOD: Lipase: 28 U/L (ref 11–51)

## 2018-10-22 MED ORDER — AZITHROMYCIN 250 MG PO TABS
500.0000 mg | ORAL_TABLET | Freq: Once | ORAL | Status: AC
  Administered 2018-10-22: 500 mg via ORAL
  Filled 2018-10-22: qty 2

## 2018-10-22 MED ORDER — IOPAMIDOL (ISOVUE-370) INJECTION 76%
INTRAVENOUS | Status: AC
  Filled 2018-10-22: qty 100

## 2018-10-22 MED ORDER — PREDNISONE 20 MG PO TABS: ORAL_TABLET | ORAL | 0 refills | Status: DC

## 2018-10-22 MED ORDER — IPRATROPIUM-ALBUTEROL 0.5-2.5 (3) MG/3ML IN SOLN
3.0000 mL | Freq: Once | RESPIRATORY_TRACT | Status: AC
  Administered 2018-10-22: 3 mL via RESPIRATORY_TRACT
  Filled 2018-10-22: qty 3

## 2018-10-22 MED ORDER — IOPAMIDOL (ISOVUE-370) INJECTION 76%
100.0000 mL | Freq: Once | INTRAVENOUS | Status: AC | PRN
  Administered 2018-10-22: 100 mL via INTRAVENOUS

## 2018-10-22 MED ORDER — AZITHROMYCIN 250 MG PO TABS: 250.0000 mg | ORAL_TABLET | Freq: Every day | ORAL | 0 refills | Status: DC

## 2018-10-22 MED ORDER — SODIUM CHLORIDE 0.9% FLUSH
3.0000 mL | Freq: Once | INTRAVENOUS | Status: AC
  Administered 2018-10-22: 3 mL via INTRAVENOUS

## 2018-10-22 MED ORDER — ALBUTEROL SULFATE HFA 108 (90 BASE) MCG/ACT IN AERS
2.0000 | INHALATION_SPRAY | Freq: Once | RESPIRATORY_TRACT | Status: AC
  Administered 2018-10-22: 2 via RESPIRATORY_TRACT
  Filled 2018-10-22: qty 6.7

## 2018-10-22 NOTE — ED Notes (Signed)
Pt ambulated around the nursing station.  O2 sat w/ good waveform between 93-95% on RA.

## 2018-10-22 NOTE — ED Triage Notes (Signed)
Pt brought in by [facility] from home for CP and SOB since this morning. Pt rates pain 2/10 at this time. Pt has hx of COPD, states he is supposed to be on O2 at home, but does not use it. States he is supposed to be on blood thinners but does not take them, states he is supposed to be on them for blood clots in his lungs. Pt given 324 ASA, 125 solumedrol, and 5 albuterol PTA. Pt endorses slight relief of SOB after tx. Pt states he still smokes 2.5 packs of cigarettes each day. Pt A+Ox4, ambulatory, and in NAD on arrival.

## 2018-10-22 NOTE — ED Provider Notes (Signed)
Emergency Department Provider Note   I have reviewed the triage vital signs and the nursing notes.   HISTORY  Chief Complaint Chest Pain and Shortness of Breath   HPI [name] is a 43 y.o. male history of schizoaffective disorder, PE, COPD and still smokes multiple packs of cigarettes a day the presents the emergency department today with chest pain, shortness of breath and cough.  Nonproductive.  Afebrile at home.  Patient states that he just kind of feels off but cannot explain that much further.  No nausea, vomiting, syncope, vision changes or headaches.  Has not anything for symptoms at home. H/o PE but not on anticoagulation at this time.  No other associated or modifying symptoms.    Past Medical History:  Diagnosis Date  . Boxer's fracture   . COPD (chronic obstructive pulmonary disease) (HCC)   . Hypertension   . Schizophrenic disorder [facility])     Patient Active Problem List   Diagnosis Date Noted  . Pulmonary embolism (HCC) 12/13/2017  . Pulmonary embolism, bilateral (HCC)   . Paranoid schizophrenia (HCC)   . Cocaine abuse with cocaine-induced mood disorder (HCC) 06/30/2017  . Substance induced mood disorder (HCC) 11/29/2015  . Homeless   . Suicidal ideation   . Schizoaffective disorder, unspecified type (HCC)   . Schizoaffective disorder (HCC) 05/08/2013  . Cocaine abuse (HCC) 11/11/2011    No past surgical history on file.  Current Outpatient Rx  . Order #: [id number] Class: Normal  . Order #: [id number] Class: Print  . Order #: [id number] Class: Normal  . Order #: [id number] Class: Normal  . Order #: [id number] Class: Normal  . Order #: [id number] Class: Normal  . Order #: [id number] Class: Normal  . Order #: [id number] Class: Print  . Order #: [id number] Class: Print    Allergies Haldol [haloperidol decanoate]  No family history on file.  Social History Social History   Tobacco Use  . Smoking status: Current Every Day Smoker    Packs/day: 2.50   Years: 15.00    Pack years: 37.50    Types: Cigarettes  . Smokeless tobacco: Never Used  Substance Use Topics  . Alcohol use: No    Comment: seldom  . Drug use: No    Review of Systems  All other systems negative except as documented in the HPI. All pertinent positives and negatives as reviewed in the HPI. ____________________________________________   PHYSICAL EXAM:  VITAL SIGNS: ED Triage Vitals  Enc Vitals Group     BP 10/22/18 2030 129/82     Pulse Rate 10/22/18 2030 (!) 121     Resp 10/22/18 2030 (!) 25     SpO2 10/22/18 2017 (!) 89 %     Weight 10/22/18 2023 (!) 350 lb (158.8 kg)     Height 10/22/18 2023 6' (1.829 m)    Constitutional: Alert and oriented. Well appearing and in no acute distress. Eyes: Conjunctivae are normal. PERRL. EOMI. Head: Atraumatic. Nose: No congestion/rhinnorhea. Mouth/Throat: Mucous membranes are moist.  Oropharynx non-erythematous. Neck: No stridor.  No meningeal signs.   Cardiovascular: tachycardic rate, regular rhythm. Good peripheral circulation. Grossly normal heart sounds.   Respiratory: Normal respiratory effort.  No retractions. Lungs diminished and wheezing. Gastrointestinal: Soft and nontender. No distention.  Musculoskeletal: No lower extremity tenderness nor edema. No gross deformities of extremities. Neurologic:  Normal speech and language. No gross focal neurologic deficits are appreciated.  Skin:  Skin is warm, dry and intact. No rash noted.  ____________________________________________   LABS (all labs ordered are listed,  but only abnormal results are displayed)  Labs Reviewed  BASIC METABOLIC PANEL - Abnormal; Notable for the following components:      Result Value   Chloride 94 (*)    Glucose, Bld 119 (*)    All other components within normal limits  CBC - Abnormal; Notable for the following components:   WBC 11.1 (*)    All other components within normal limits  HEPATIC FUNCTION PANEL  LIPASE, BLOOD  I-STAT  TROPONIN, ED   ____________________________________________  EKG   EKG Interpretation  Date/Time:  Sunday October 22 2018 20:18:35 EST Ventricular Rate:  127 PR Interval:    QRS Duration: 85 QT Interval:  302 QTC Calculation: 439 R Axis:   59 Text Interpretation:  Sinus tachycardia Low voltage, precordial leads similar to 11/2017 Confirmed by Marily MemosMesner, Hillis Mcphatter [phone]) on 10/22/2018 10:28:28 PM       ____________________________________________  RADIOLOGY  Ct Angio Chest Pe W And/or Wo Contrast  Result Date: 10/22/2018 CLINICAL DATA:  PE suspected, high pretest prob Chest pain and shortness of breath. History of pulmonary embolus, noncompliant with anticoagulation. EXAM: CT ANGIOGRAPHY CHEST WITH CONTRAST TECHNIQUE: Multidetector CT imaging of the chest was performed using the standard protocol during bolus administration of intravenous contrast. Multiplanar CT image reconstructions and MIPs were obtained to evaluate the vascular anatomy. CONTRAST:  100mL ISOVUE-370 IOPAMIDOL (ISOVUE-370) INJECTION 76% COMPARISON:  Radiographs 04/10/2018. CT 12/13/2017 FINDINGS: Cardiovascular: Prior filling defects within the lower lobe segmental branches on prior exam have resolved. No new filling defects in the pulmonary arteries to suggest pulmonary embolus. Soft tissue attenuation from body habitus limits detailed assessment. The heart is normal in size. No aortic dissection. No pericardial effusion. Mediastinum/Nodes: No enlarged mediastinal or hilar lymph nodes. Diffuse mediastinal lipomatosis likely secondary to habitus. The esophagus is decompressed. No visualized thyroid nodule. Lungs/Pleura: Breathing motion artifact. Central bronchitic change. No confluent airspace disease. Slight heterogeneous parenchyma may be due to small airways disease or expiratory phase imaging. No pulmonary edema or pleural effusion. No pulmonary mass. Upper Abdomen: Enlarged liver is partially included. Suspect gallstone. No  acute findings. Musculoskeletal: There are no acute or suspicious osseous abnormalities. Review of the MIP images confirms the above findings. IMPRESSION: 1. No pulmonary embolus. Prior lower lobe pulmonary emboli have resolved. 2. Mild central bronchitic change. Electronically Signed   By: Narda RutherfordMelanie  Sanford M.D.   On: 10/22/2018 22:16    ____________________________________________   PROCEDURES  Procedure(s) performed:   Procedures   ____________________________________________   INITIAL IMPRESSION / ASSESSMENT AND PLAN / ED COURSE  Patient with negative work-up here.  Patient with significant improvement in symptoms at this time.  Ambulated with oxygen saturation 93-95% on room air.  Still slightly tachycardic but improved from arrival and I suspect this is probably more related to the albuterol but how ever he has been tachycardic on all of the most recent vital signs that I can find.  This may just be his baseline with worsening because of the albuterol.  No evidence of sepsis. Random low saturations are likely false. Will treat for bronchitis.  Pertinent labs & imaging results that were available during my care of the patient were reviewed by me and considered in my medical decision making (see chart for details).  ____________________________________________  FINAL CLINICAL IMPRESSION(S) / ED DIAGNOSES  Final diagnoses:  Bronchitis     MEDICATIONS GIVEN DURING THIS VISIT:  Medications  iopamidol (ISOVUE-370) 76 % injection (has no administration in time range)  sodium chloride flush (NS) 0.9 %  injection 3 mL (3 mLs Intravenous Given 10/22/18 2028)  ipratropium-albuterol (DUONEB) 0.5-2.5 (3) MG/3ML nebulizer solution 3 mL (3 mLs Nebulization Given 10/22/18 2200)  iopamidol (ISOVUE-370) 76 % injection 100 mL (100 mLs Intravenous Contrast Given 10/22/18 2146)  albuterol (PROVENTIL HFA;VENTOLIN HFA) 108 (90 Base) MCG/ACT inhaler 2 puff (2 puffs Inhalation Provided for home use  10/22/18 2359)  azithromycin (ZITHROMAX) tablet 500 mg (500 mg Oral Given 10/22/18 2359)     NEW OUTPATIENT MEDICATIONS STARTED DURING THIS VISIT:  Discharge Medication List as of 10/22/2018 11:50 PM    START taking these medications   Details  azithromycin (ZITHROMAX) 250 MG tablet Take 1 tablet (250 mg total) by mouth daily. Take 1 every day until finished., Starting Sun 10/22/2018, Print    predniSONE (DELTASONE) 20 MG tablet 2 tabs po daily x 4 days, Print        Note:  This note was prepared with assistance of Dragon voice recognition software. Occasional wrong-word or sound-a-like substitutions may have occurred due to the inherent limitations of voice recognition software.   Annalyse Langlais, Barbara Cower, MD 10/23/18 [phone]

## 2018-10-23 ENCOUNTER — Telehealth: Payer: Self-pay | Admitting: *Deleted

## 2018-10-23 ENCOUNTER — Encounter: Payer: Self-pay | Admitting: *Deleted

## 2018-10-23 NOTE — Telephone Encounter (Signed)
Phelan Goers J. Lucretia Roers, RN, BSN, [place] [phone]  [facility] set up appointment with Primary Care at [facility] on 2/13 @ 2:10.  Both numbers listed were no answered.  Will send letter to address listed.

## 2018-10-23 NOTE — Telephone Encounter (Signed)
[facility] obtained appointment with Primary Care at [facility] 2/13 @ 2:10.

## 2018-10-28 ENCOUNTER — Emergency Department (HOSPITAL_COMMUNITY)
Admission: EM | Admit: 2018-10-28 | Discharge: 2018-10-29 | Disposition: A | Discharge status: Home / Self Care | Payer: Medicare Other | Attending: Emergency Medicine | Admitting: Emergency Medicine

## 2018-10-28 ENCOUNTER — Emergency Department (HOSPITAL_COMMUNITY): Payer: Medicare Other

## 2018-10-28 ENCOUNTER — Encounter (HOSPITAL_COMMUNITY): Payer: Self-pay

## 2018-10-28 DIAGNOSIS — F17210 Nicotine dependence, cigarettes, uncomplicated: Secondary | ICD-10-CM | POA: Diagnosis not present

## 2018-10-28 DIAGNOSIS — R079 Chest pain, unspecified: Secondary | ICD-10-CM | POA: Diagnosis not present

## 2018-10-28 DIAGNOSIS — R0602 Shortness of breath: Secondary | ICD-10-CM | POA: Diagnosis not present

## 2018-10-28 DIAGNOSIS — I10 Essential (primary) hypertension: Secondary | ICD-10-CM | POA: Diagnosis not present

## 2018-10-28 DIAGNOSIS — Z79899 Other long term (current) drug therapy: Secondary | ICD-10-CM | POA: Diagnosis not present

## 2018-10-28 DIAGNOSIS — J449 Chronic obstructive pulmonary disease, unspecified: Secondary | ICD-10-CM | POA: Insufficient documentation

## 2018-10-28 DIAGNOSIS — R0789 Other chest pain: Secondary | ICD-10-CM | POA: Diagnosis not present

## 2018-10-28 DIAGNOSIS — R000 Tachycardia, unspecified: Secondary | ICD-10-CM | POA: Diagnosis not present

## 2018-10-28 DIAGNOSIS — F410 Panic disorder [episodic paroxysmal anxiety] without agoraphobia: Secondary | ICD-10-CM | POA: Insufficient documentation

## 2018-10-28 DIAGNOSIS — I2699 Other pulmonary embolism without acute cor pulmonale: Secondary | ICD-10-CM

## 2018-10-28 DIAGNOSIS — F29 Unspecified psychosis not due to a substance or known physiological condition: Secondary | ICD-10-CM | POA: Diagnosis not present

## 2018-10-28 HISTORY — DX: Other pulmonary embolism without acute cor pulmonale: I26.99

## 2018-10-28 LAB — POCT I-STAT EG7
Acid-Base Excess: 8 mmol/L — ABNORMAL HIGH (ref 0.0–2.0)
Bicarbonate: 35.5 mmol/L — ABNORMAL HIGH (ref 20.0–28.0)
Calcium, Ion: 1.21 mmol/L (ref 1.15–1.40)
HEMATOCRIT: 43 % (ref 39.0–52.0)
HEMOGLOBIN: 14.6 g/dL (ref 13.0–17.0)
O2 Saturation: 62 %
Potassium: 3.7 mmol/L (ref 3.5–5.1)
Sodium: 138 mmol/L (ref 135–145)
TCO2: 37 mmol/L — ABNORMAL HIGH (ref 22–32)
pCO2, Ven: 59.1 mmHg (ref 44.0–60.0)
pH, Ven: 7.387 (ref 7.250–7.430)
pO2, Ven: 34 mmHg (ref 32.0–45.0)

## 2018-10-28 LAB — BASIC METABOLIC PANEL
Anion gap: 14 (ref 5–15)
BUN: 6 mg/dL (ref 6–20)
CO2: 30 mmol/L (ref 22–32)
Calcium: 9.2 mg/dL (ref 8.9–10.3)
Chloride: 93 mmol/L — ABNORMAL LOW (ref 98–111)
Creatinine, Ser: 0.84 mg/dL (ref 0.61–1.24)
GFR calc Af Amer: >60 mL/min (ref >60)
GFR calc non Af Amer: >60 mL/min (ref >60)
Glucose, Bld: 135 mg/dL — ABNORMAL HIGH (ref 70–99)
Potassium: 3.5 mmol/L (ref 3.5–5.1)
Sodium: 137 mmol/L (ref 135–145)

## 2018-10-28 LAB — RAPID URINE DRUG SCREEN, HOSP PERFORMED
Amphetamines: NOT DETECTED
Barbiturates: NOT DETECTED
Benzodiazepines: NOT DETECTED
Cocaine: NOT DETECTED
Opiates: NOT DETECTED
Tetrahydrocannabinol: NOT DETECTED

## 2018-10-28 LAB — CBC
HCT: 47.7 % (ref 39.0–52.0)
Hemoglobin: 15.4 g/dL (ref 13.0–17.0)
MCH: 31.8 pg (ref 26.0–34.0)
MCHC: 32.3 g/dL (ref 30.0–36.0)
MCV: 98.4 fL (ref 80.0–100.0)
PLATELETS: 190 10*3/uL (ref 150–400)
RBC: 4.85 MIL/uL (ref 4.22–5.81)
RDW: 13 % (ref 11.5–15.5)
WBC: 10.3 10*3/uL (ref 4.0–10.5)
nRBC: 0 % (ref 0.0–0.2)

## 2018-10-28 LAB — I-STAT TROPONIN, ED: Troponin i, poc: 0 ng/mL (ref 0.00–0.08)

## 2018-10-28 LAB — TSH: TSH: 2.911 u[IU]/mL (ref 0.350–4.500)

## 2018-10-28 MED ORDER — METHYLPREDNISOLONE SODIUM SUCC 125 MG IJ SOLR
125.0000 mg | Freq: Once | INTRAMUSCULAR | Status: DC
  Filled 2018-10-28: qty 2

## 2018-10-28 MED ORDER — SODIUM CHLORIDE 0.9 % IV BOLUS
1000.0000 mL | Freq: Once | INTRAVENOUS | Status: AC
  Administered 2018-10-28: 1000 mL via INTRAVENOUS

## 2018-10-28 MED ORDER — IPRATROPIUM BROMIDE 0.02 % IN SOLN
0.5000 mg | Freq: Once | RESPIRATORY_TRACT | Status: DC
  Filled 2018-10-28: qty 2.5

## 2018-10-28 MED ORDER — ALBUTEROL SULFATE (2.5 MG/3ML) 0.083% IN NEBU
5.0000 mg | INHALATION_SOLUTION | Freq: Once | RESPIRATORY_TRACT | Status: DC
  Filled 2018-10-28: qty 6

## 2018-10-28 MED ORDER — SODIUM CHLORIDE 0.9% FLUSH: 3.0000 mL | Freq: Once | INTRAVENOUS | Status: DC

## 2018-10-28 MED ORDER — LABETALOL HCL 5 MG/ML IV SOLN
10.0000 mg | Freq: Once | INTRAVENOUS | Status: AC
  Administered 2018-10-28: 10 mg via INTRAVENOUS
  Filled 2018-10-28: qty 4

## 2018-10-28 MED ORDER — LORAZEPAM 2 MG/ML IJ SOLN
1.0000 mg | Freq: Once | INTRAMUSCULAR | Status: AC
  Administered 2018-10-28: 1 mg via INTRAVENOUS
  Filled 2018-10-28: qty 1

## 2018-10-28 MED ORDER — METHYLPREDNISOLONE SODIUM SUCC 125 MG IJ SOLR
125.0000 mg | Freq: Once | INTRAMUSCULAR | Status: AC
  Administered 2018-10-28: 125 mg via INTRAVENOUS

## 2018-10-28 NOTE — Discharge Instructions (Addendum)
Stay hydrated.   See your doctor for follow up   Get your invega injection as scheduled   Return to ER if you have worse chest pain, trouble breathing, palpitations

## 2018-10-28 NOTE — ED Provider Notes (Signed)
[facility] EMERGENCY DEPARTMENT Provider Note   CSN: [id number] Arrival date & time: 10/28/18  2050     History   Chief Complaint Chief Complaint  Patient presents with  . Chest Pain  . Shortness of Breath    HPI [name] is a 43 y.o. male hx of COPD, HTN, schizophrenia, here presenting with palpitations, subjective shortness of breath.  Patient states that he has been having shortness of breath for the last several weeks.  He was seen in the ED about 4 days ago and had a CTA that showed no PE.  He was diagnosed with bronchitis.  Patient states that he has been increasingly anxious over the last several days and had persistent palpitations.  He does admit to not taking his psychiatric medicines but denies any suicidal homicidal ideations.  Patient was given aspirin by EMS.  Denies any cough or fevers.  The history is provided by the patient.    Past Medical History:  Diagnosis Date  . Boxer's fracture   . COPD (chronic obstructive pulmonary disease) (HCC)   . Hypertension   . Schizophrenic disorder [facility])     Patient Active Problem List   Diagnosis Date Noted  . Pulmonary embolism (HCC) 12/13/2017  . Pulmonary embolism, bilateral (HCC)   . Paranoid schizophrenia (HCC)   . Cocaine abuse with cocaine-induced mood disorder (HCC) 06/30/2017  . Substance induced mood disorder (HCC) 11/29/2015  . Homeless   . Suicidal ideation   . Schizoaffective disorder, unspecified type (HCC)   . Schizoaffective disorder (HCC) 05/08/2013  . Cocaine abuse (HCC) 11/11/2011    History reviewed. No pertinent surgical history.      Home Medications    Prior to Admission medications   Medication Sig Start Date End Date Taking? Authorizing Provider  albuterol (PROVENTIL) (2.5 MG/3ML) 0.083% nebulizer solution Inhale 3 mLs (2.5 mg total) into the lungs every 6 (six) hours as needed for wheezing or shortness of breath. 12/15/17   Shirley, [place], DO  azithromycin  (ZITHROMAX) 250 MG tablet Take 1 tablet (250 mg total) by mouth daily. Take 1 every day until finished. 10/22/18   Mesner, Barbara Cower, MD  gabapentin (NEURONTIN) 100 MG capsule Take 2 capsules (200 mg total) by mouth 3 (three) times daily. Patient taking differently: Take 800 mg by mouth at bedtime.  01/31/17   Charm Rings, NP  hydrochlorothiazide (MICROZIDE) 12.5 MG capsule Take 1 capsule (12.5 mg total) by mouth daily. Patient not taking: Reported on 04/16/2018 12/15/17   Shirley, [place], DO  nicotine (NICODERM CQ - DOSED IN MG/24 HOURS) 21 mg/24hr patch Place 1 patch (21 mg total) onto the skin daily. Patient taking differently: Place 21 mg onto the skin daily as needed (smoking cessation).  12/16/17   Shirley, [place], DO  OLANZapine zydis (ZYPREXA) 20 MG disintegrating tablet Take 1 tablet (20 mg total) by mouth at bedtime. 01/31/17   Charm Rings, NP  ondansetron (ZOFRAN) 4 MG tablet Take 1 tablet (4 mg total) by mouth every 6 (six) hours. 06/12/18   Hedges, Tinnie Gens, PA-C  potassium chloride SA (K-DUR,KLOR-CON) 20 MEQ tablet Take 1 tablet (20 mEq total) by mouth daily. 04/16/18   Arby Barrette, MD  predniSONE (DELTASONE) 20 MG tablet 2 tabs po daily x 4 days 10/22/18   Mesner, Barbara Cower, MD    Family History History reviewed. No pertinent family history.  Social History Social History   Tobacco Use  . Smoking status: Current Every Day Smoker  Packs/day: 2.50    Years: 15.00    Pack years: 37.50    Types: Cigarettes  . Smokeless tobacco: Never Used  Substance Use Topics  . Alcohol use: No    Comment: seldom  . Drug use: No     Allergies   Haldol [haloperidol decanoate]   Review of Systems Review of Systems  Respiratory: Positive for shortness of breath.   Cardiovascular: Positive for chest pain.  All other systems reviewed and are negative.    Physical Exam Updated Vital Signs BP (!) 151/97 (BP Location: Right Arm)   Pulse (!) 121   Temp 98.6 F (37 C) (Oral)   Resp (!)  23   Ht 6' (1.829 m)   Wt (!) 158.8 kg   SpO2 95%   BMI 47.47 kg/m   Physical Exam Vitals signs and nursing note reviewed.  Constitutional:      Appearance: He is well-developed.     Comments: Anxious   HENT:     Head: Normocephalic.  Eyes:     Extraocular Movements: Extraocular movements intact.     Pupils: Pupils are equal, round, and reactive to light.  Neck:     Musculoskeletal: Normal range of motion and neck supple.  Cardiovascular:     Rate and Rhythm: Regular rhythm. Tachycardia present.     Heart sounds: Normal heart sounds.  Pulmonary:     Effort: Pulmonary effort is normal.  Abdominal:     General: Bowel sounds are normal.     Palpations: Abdomen is soft.  Musculoskeletal: Normal range of motion.     Right lower leg: He exhibits no tenderness. No edema.     Left lower leg: He exhibits no tenderness. No edema.  Skin:    General: Skin is warm.     Capillary Refill: Capillary refill takes less than 2 seconds.  Neurological:     General: No focal deficit present.     Mental Status: He is alert.  Psychiatric:        Mood and Affect: Mood normal.        Behavior: Behavior normal.      ED Treatments / Results  Labs (all labs ordered are listed, but only abnormal results are displayed) Labs Reviewed  BASIC METABOLIC PANEL - Abnormal; Notable for the following components:      Result Value   Chloride 93 (*)    Glucose, Bld 135 (*)    All other components within normal limits  POCT I-STAT EG7 - Abnormal; Notable for the following components:   Bicarbonate 35.5 (*)    TCO2 37 (*)    Acid-Base Excess 8.0 (*)    All other components within normal limits  CBC  TSH  RAPID URINE DRUG SCREEN, HOSP PERFORMED  I-STAT TROPONIN, ED    EKG EKG Interpretation  Date/Time:  Saturday October 28 2018 20:55:19 EST Ventricular Rate:  123 PR Interval:    QRS Duration: 96 QT Interval:  317 QTC Calculation: 454 R Axis:   100 Text Interpretation:  Sinus tachycardia  Right axis deviation Low voltage, precordial leads No significant change since last tracing Confirmed by Richardean CanalYao, David H [id number]) on 10/28/2018 8:59:35 PM   Radiology No results found.  Procedures Procedures (including critical care time)  Medications Ordered in ED Medications  sodium chloride flush (NS) 0.9 % injection 3 mL (has no administration in time range)  sodium chloride 0.9 % bolus 1,000 mL (1,000 mLs Intravenous New Bag/Given 10/28/18 2116)  methylPREDNISolone sodium succinate (  SOLU-MEDROL) 125 mg/2 mL injection 125 mg (has no administration in time range)  labetalol (NORMODYNE,TRANDATE) injection 10 mg (10 mg Intravenous Given 10/28/18 2120)  LORazepam (ATIVAN) injection 1 mg (1 mg Intravenous Given 10/28/18 2120)     Initial Impression / Assessment and Plan / ED Course  I have reviewed the triage vital signs and the nursing notes.  Pertinent labs & imaging results that were available during my care of the patient were reviewed by me and considered in my medical decision making (see chart for details).    [name] is a 43 y.o. male here with SOB, tachycardia. Just had CT PE that was normal on 1/27. Patient appears very anxious and didn't take his pysch meds. Denies any suicidal or homicidal ideation. I think likely panic attacks. Will get labs, CXR. Will give ativan for anxiety.   11:29 PM TSH 2.911. Labs unremarkable. HR down to 106 from 120s. CXR and trop negative. He continues to denies suicidal or homicidal ideation. Likely panic attacks. Stable for discharge     Final Clinical Impressions(s) / ED Diagnoses   Final diagnoses:  None    ED Discharge Orders    None       Charlynne Pander, MD 10/28/18 2329

## 2018-10-28 NOTE — ED Triage Notes (Signed)
Brought by ems due to chest pain that has been going on for over 2 weeks central that has worsen today. Pt states its a 5/10 and also been SOB today. Pt was given by ems 324 asa. But no nitro at this time.

## 2018-11-01 ENCOUNTER — Other Ambulatory Visit: Payer: Self-pay

## 2018-11-01 ENCOUNTER — Emergency Department (HOSPITAL_COMMUNITY)
Admission: EM | Admit: 2018-11-01 | Discharge: 2018-11-01 | Disposition: A | Discharge status: Home / Self Care | Payer: Medicare Other | Attending: Emergency Medicine | Admitting: Emergency Medicine

## 2018-11-01 ENCOUNTER — Emergency Department (HOSPITAL_COMMUNITY): Payer: Medicare Other

## 2018-11-01 DIAGNOSIS — F17210 Nicotine dependence, cigarettes, uncomplicated: Secondary | ICD-10-CM | POA: Diagnosis not present

## 2018-11-01 DIAGNOSIS — F1410 Cocaine abuse, uncomplicated: Secondary | ICD-10-CM

## 2018-11-01 DIAGNOSIS — F23 Brief psychotic disorder: Secondary | ICD-10-CM | POA: Diagnosis not present

## 2018-11-01 DIAGNOSIS — R000 Tachycardia, unspecified: Secondary | ICD-10-CM | POA: Diagnosis not present

## 2018-11-01 DIAGNOSIS — J449 Chronic obstructive pulmonary disease, unspecified: Secondary | ICD-10-CM | POA: Insufficient documentation

## 2018-11-01 DIAGNOSIS — R443 Hallucinations, unspecified: Secondary | ICD-10-CM | POA: Diagnosis not present

## 2018-11-01 DIAGNOSIS — R410 Disorientation, unspecified: Secondary | ICD-10-CM | POA: Diagnosis not present

## 2018-11-01 DIAGNOSIS — F209 Schizophrenia, unspecified: Secondary | ICD-10-CM | POA: Diagnosis not present

## 2018-11-01 DIAGNOSIS — I10 Essential (primary) hypertension: Secondary | ICD-10-CM | POA: Insufficient documentation

## 2018-11-01 DIAGNOSIS — R413 Other amnesia: Secondary | ICD-10-CM | POA: Diagnosis not present

## 2018-11-01 DIAGNOSIS — F1420 Cocaine dependence, uncomplicated: Secondary | ICD-10-CM | POA: Insufficient documentation

## 2018-11-01 DIAGNOSIS — Z79899 Other long term (current) drug therapy: Secondary | ICD-10-CM | POA: Insufficient documentation

## 2018-11-01 DIAGNOSIS — F29 Unspecified psychosis not due to a substance or known physiological condition: Secondary | ICD-10-CM | POA: Diagnosis present

## 2018-11-01 DIAGNOSIS — R0602 Shortness of breath: Secondary | ICD-10-CM | POA: Diagnosis not present

## 2018-11-01 LAB — CBC WITH DIFFERENTIAL/PLATELET
Abs Immature Granulocytes: 0.05 10*3/uL (ref 0.00–0.07)
Basophils Absolute: 0.1 10*3/uL (ref 0.0–0.1)
Basophils Relative: 1 %
EOS PCT: 6 %
Eosinophils Absolute: 0.5 10*3/uL (ref 0.0–0.5)
HCT: 45.1 % (ref 39.0–52.0)
Hemoglobin: 15.1 g/dL (ref 13.0–17.0)
Immature Granulocytes: 1 %
Lymphocytes Relative: 30 %
Lymphs Abs: 2.5 10*3/uL (ref 0.7–4.0)
MCH: 32.4 pg (ref 26.0–34.0)
MCHC: 33.5 g/dL (ref 30.0–36.0)
MCV: 96.8 fL (ref 80.0–100.0)
Monocytes Absolute: 0.5 10*3/uL (ref 0.1–1.0)
Monocytes Relative: 6 %
Neutro Abs: 4.8 10*3/uL (ref 1.7–7.7)
Neutrophils Relative %: 56 %
Platelets: 188 10*3/uL (ref 150–400)
RBC: 4.66 MIL/uL (ref 4.22–5.81)
RDW: 13.1 % (ref 11.5–15.5)
WBC: 8.3 10*3/uL (ref 4.0–10.5)
nRBC: 0 % (ref 0.0–0.2)

## 2018-11-01 LAB — COMPREHENSIVE METABOLIC PANEL
ALT: 21 U/L (ref 0–44)
AST: 21 U/L (ref 15–41)
Albumin: 3.5 g/dL (ref 3.5–5.0)
Alkaline Phosphatase: 85 U/L (ref 38–126)
Anion gap: 10 (ref 5–15)
BUN: 7 mg/dL (ref 6–20)
CO2: 30 mmol/L (ref 22–32)
Calcium: 8.8 mg/dL — ABNORMAL LOW (ref 8.9–10.3)
Chloride: 100 mmol/L (ref 98–111)
Creatinine, Ser: 0.91 mg/dL (ref 0.61–1.24)
GFR calc Af Amer: >60 mL/min (ref >60)
Glucose, Bld: 125 mg/dL — ABNORMAL HIGH (ref 70–99)
Potassium: 3.4 mmol/L — ABNORMAL LOW (ref 3.5–5.1)
Sodium: 140 mmol/L (ref 135–145)
Total Bilirubin: 0.7 mg/dL (ref 0.3–1.2)
Total Protein: 6.3 g/dL — ABNORMAL LOW (ref 6.5–8.1)

## 2018-11-01 LAB — TROPONIN I: Troponin I: <0.03 ng/mL (ref <0.03)

## 2018-11-01 LAB — BRAIN NATRIURETIC PEPTIDE: B Natriuretic Peptide: 12.8 pg/mL (ref 0.0–100.0)

## 2018-11-01 LAB — RAPID URINE DRUG SCREEN, HOSP PERFORMED
AMPHETAMINES: NOT DETECTED
Barbiturates: NOT DETECTED
Benzodiazepines: NOT DETECTED
Cocaine: POSITIVE — AB
Opiates: NOT DETECTED
Tetrahydrocannabinol: NOT DETECTED

## 2018-11-01 LAB — ETHANOL: Alcohol, Ethyl (B): <10 mg/dL (ref <10)

## 2018-11-01 NOTE — ED Notes (Addendum)
Spoke with [name] has been cleared and is shceduled to follow up with [facility].

## 2018-11-01 NOTE — ED Provider Notes (Addendum)
[facility] EMERGENCY DEPARTMENT Provider Note   CSN: [id number] Arrival date & time: 11/01/18  1801     History   Chief Complaint Chief Complaint  Patient presents with  . Altered Mental Status    HPI [name] is a 43 y.o. male.  HPI Patient presents with mental status changes.  States his been feeling bad over the last 5 months but worse over the last couple days since he is been smoking crack.  Just confused.  He has been off his schizophrenia medicine for months also.  No headache.  Denies other substance abuse.  No suicidal homicidal thoughts.  He is somewhat difficult to get a history from.  No chest pain.  Does have cough with some mild sputum production. Past Medical History:  Diagnosis Date  . Boxer's fracture   . COPD (chronic obstructive pulmonary disease) (HCC)   . Hypertension   . Schizophrenic disorder [facility])     Patient Active Problem List   Diagnosis Date Noted  . Pulmonary embolism (HCC) 12/13/2017  . Pulmonary embolism, bilateral (HCC)   . Paranoid schizophrenia (HCC)   . Cocaine abuse with cocaine-induced mood disorder (HCC) 06/30/2017  . Substance induced mood disorder (HCC) 11/29/2015  . Homeless   . Suicidal ideation   . Schizoaffective disorder, unspecified type (HCC)   . Schizoaffective disorder (HCC) 05/08/2013  . Cocaine abuse (HCC) 11/11/2011    No past surgical history on file.      Home Medications    Prior to Admission medications   Medication Sig Start Date End Date Taking? Authorizing Provider  albuterol (PROVENTIL HFA;VENTOLIN HFA) 108 (90 Base) MCG/ACT inhaler Inhale 1 puff into the lungs every 6 (six) hours as needed for wheezing or shortness of breath.    [provider]  albuterol (PROVENTIL) (2.5 MG/3ML) 0.083% nebulizer solution Inhale 3 mLs (2.5 mg total) into the lungs every 6 (six) hours as needed for wheezing or shortness of breath. Patient not taking: Reported on 10/28/2018 12/15/17   Shirley,  [place], DO  azithromycin (ZITHROMAX) 250 MG tablet Take 1 tablet (250 mg total) by mouth daily. Take 1 every day until finished. Patient taking differently: Take 250 mg by mouth daily. 4 day course completed 1/29 or 1/30 10/22/18   Mesner, Barbara Cower, MD  gabapentin (NEURONTIN) 100 MG capsule Take 2 capsules (200 mg total) by mouth 3 (three) times daily. Patient not taking: Reported on 10/28/2018 01/31/17   Charm Rings, NP  gabapentin (NEURONTIN) 800 MG tablet Take 800 mg by mouth 3 (three) times daily.    [provider]  hydrochlorothiazide (MICROZIDE) 12.5 MG capsule Take 1 capsule (12.5 mg total) by mouth daily. Patient not taking: Reported on 04/16/2018 12/15/17   Shirley, [place], DO  nicotine (NICODERM CQ - DOSED IN MG/24 HOURS) 21 mg/24hr patch Place 1 patch (21 mg total) onto the skin daily. Patient not taking: Reported on 10/28/2018 12/16/17   Shirley, [place], DO  OLANZapine (ZYPREXA) 20 MG tablet Take 20 mg by mouth at bedtime.  10/02/18   [provider]  OLANZapine zydis (ZYPREXA) 20 MG disintegrating tablet Take 1 tablet (20 mg total) by mouth at bedtime. Patient not taking: Reported on 10/28/2018 01/31/17   Charm Rings, NP  ondansetron (ZOFRAN) 4 MG tablet Take 1 tablet (4 mg total) by mouth every 6 (six) hours. Patient not taking: Reported on 10/28/2018 06/12/18   Hedges, Tinnie Gens, PA-C  paliperidone (INVEGA SUSTENNA) 156 MG/ML SUSY injection Inject 156 mg into  the muscle every 30 (thirty) days. On the 4th of each month    [provider]  potassium chloride SA (K-DUR,KLOR-CON) 20 MEQ tablet Take 1 tablet (20 mEq total) by mouth daily. Patient not taking: Reported on 10/28/2018 04/16/18   Arby BarrettePfeiffer, Marcy, MD  predniSONE (DELTASONE) 20 MG tablet 2 tabs po daily x 4 days Patient taking differently: Take 40 mg by mouth daily. 4 day course completed 1/29 or 1/30 10/22/18   Mesner, Barbara CowerJason, MD    Family History No family history on file.  Social History Social History    Tobacco Use  . Smoking status: Current Every Day Smoker    Packs/day: 2.50    Years: 15.00    Pack years: 37.50    Types: Cigarettes  . Smokeless tobacco: Never Used  Substance Use Topics  . Alcohol use: No    Comment: seldom  . Drug use: No     Allergies   Haldol [haloperidol decanoate]   Review of Systems Review of Systems  Constitutional: Negative for appetite change.  HENT: Negative for congestion.   Respiratory: Positive for cough and shortness of breath.   Cardiovascular: Negative for chest pain.  Gastrointestinal: Negative for abdominal pain.  Genitourinary: Negative for flank pain.  Musculoskeletal: Negative for back pain.  Skin: Negative for rash.  Neurological: Negative for speech difficulty.  Psychiatric/Behavioral: Positive for confusion and hallucinations.     Physical Exam Updated Vital Signs BP 125/79   Pulse 100   Temp 98.4 F (36.9 C) (Oral)   Resp (!) 26   Ht 6' (1.829 m)   Wt (!) 158 kg   SpO2 92%   BMI 47.24 kg/m   Physical Exam HENT:     Head: Atraumatic.     Mouth/Throat:     Mouth: Mucous membranes are moist.  Eyes:     Pupils: Pupils are equal, round, and reactive to light.  Neck:     Musculoskeletal: Neck supple.  Cardiovascular:     Rate and Rhythm: Normal rate.  Pulmonary:     Comments: Mildly harsh breath sounds Abdominal:     Tenderness: There is no abdominal tenderness.  Musculoskeletal:        General: No deformity.  Skin:    Capillary Refill: Capillary refill takes less than 2 seconds.     Coloration: Skin is not pale.  Neurological:     General: No focal deficit present.     Mental Status: He is alert.      ED Treatments / Results  Labs (all labs ordered are listed, but only abnormal results are displayed) Labs Reviewed  COMPREHENSIVE METABOLIC PANEL - Abnormal; Notable for the following components:      Result Value   Potassium 3.4 (*)    Glucose, Bld 125 (*)    Calcium 8.8 (*)    Total Protein 6.3  (*)    All other components within normal limits  RAPID URINE DRUG SCREEN, HOSP PERFORMED - Abnormal; Notable for the following components:   Cocaine POSITIVE (*)    All other components within normal limits  BRAIN NATRIURETIC PEPTIDE  TROPONIN I  ETHANOL  CBC WITH DIFFERENTIAL/PLATELET    EKG EKG Interpretation  Date/Time:  Wednesday November 01 2018 18:04:10 EST Ventricular Rate:  106 PR Interval:  144 QRS Duration: 88 QT Interval:  340 QTC Calculation: 451 R Axis:   69 Text Interpretation:  Sinus tachycardia Low voltage QRS Cannot rule out Anterior infarct , age undetermined Abnormal ECG Confirmed by  Benjiman Core [phone]) on 11/01/2018 6:39:15 PM   Radiology Dg Chest 2 View  Result Date: 11/01/2018 CLINICAL DATA:  Amnesia for 2 days since doing crack. Confusion. EXAM: CHEST - 2 VIEW COMPARISON:  10/28/2018 FINDINGS: Cardiac enlargement. No vascular congestion. No airspace disease or consolidation in the lungs. No blunting of costophrenic angles. No pneumothorax. Mediastinal contours appear intact. IMPRESSION: Cardiac enlargement. No evidence of active pulmonary disease. Electronically Signed   By: Burman Nieves M.D.   On: 11/01/2018 19:12    Procedures Procedures (including critical care time)  Medications Ordered in ED Medications - No data to display   Initial Impression / Assessment and Plan / ED Course  I have reviewed the triage vital signs and the nursing notes.  Pertinent labs & imaging results that were available during my care of the patient were reviewed by me and considered in my medical decision making (see chart for details).     Patient presents with cocaine abuse and schizophrenia.  Various complaints.  At this point after work-up I feel he is medically cleared.  To be seen by TTS.  Final Clinical Impressions(s) / ED Diagnoses   Final diagnoses:  Cocaine abuse (HCC)  Schizophrenia, unspecified type [facility])    ED Discharge Orders    None        Benjiman Core, MD 11/01/18 2031    Benjiman Core, MD 11/20/18 [phone]

## 2018-11-01 NOTE — Discharge Instructions (Signed)
Follow-up with [facility] as discussed.

## 2018-11-01 NOTE — ED Notes (Signed)
Returned patient belongings & items from security.

## 2018-11-01 NOTE — ED Notes (Signed)
MD Rubin Payor spoke with patient who again informed patient he is medically cleared and needs to follow up with [facility] as discussed.

## 2018-11-01 NOTE — ED Triage Notes (Signed)
Pt endorses amnesia x 2 days since doing crack. Pt had been clean for 5 months until then. Pt states "I don't know where I'm at" but then stated that he was "here at [facility] recently for help" Pt oriented to self, place and situation. No neuro deficits, denies pain.

## 2018-11-01 NOTE — ED Notes (Signed)
Patient transported to X-ray 

## 2018-11-01 NOTE — ED Notes (Addendum)
Patient changed into purple scrubs, belongings inventoried, and valuables given to security.   Patient updated on plan of care. He continues to state "something is not right with me I keep forgetting how to do things and ya'll just keep saying I'm ok".

## 2018-11-01 NOTE — ED Notes (Signed)
BH Counselor at bedside.

## 2018-11-01 NOTE — ED Notes (Signed)
Patient continues to walk back and forth in room stating "something is wrong and I know something is wrong". Patient informed he has been medically cleared and [facility].   Patient states "[facility]. I know yall are wrong".

## 2018-11-01 NOTE — BH Assessment (Addendum)
Assessment Note  [name] is an 43 y.o. male.  The pt came in after saying he felt confused.  The pt stated he has felt confused for the past 2 weeks.  He stated it became worse after he smoked crack.  The pt stated he isn't sure how much he used and only describes it as "a lot".  The pt is partially oriented.  He stated he wasn't sure what year it is and said the month is December.  He was able to name the president and was able to talk about current events about the president.  The pt stated he forgot where he lived a few days ago.  The pt denies SI and HI.  When asked about hallucinations, there was a long pause and the pt changed the subject.  The pt stated he hears voices, but wouldn't give more details about the voices.  He goes to [place] and gets an [place] shot.  The pt stated he was supposed to get his shot yesterday.  He has not been inpatient, since 2016.  The pt rents a room from someone.  The pt denies self harm, HI, legal issues, and history of abuse.  The pt is sleeping more than he should.  He has a good appetite.  The pt's UDS was positive of cocaine and negative for all other substances.  Pt is dressed in scrubs. He is alert and oriented x4. Pt speaks in a clear tone, at moderate volume and normal pace. Eye contact is fair. Pt's mood is restless.  He walked around the room and would sit in various chairs and the bed during the assessment. Thought process is blocked. There is no indication Pt is currently responding to internal stimuli or experiencing delusional thought content.?Pt was cooperative throughout assessment.    Diagnosis: F23 Brief psychotic disorder F14.20 Cocaine use disorder, Severe  Past Medical History:  Past Medical History:  Diagnosis Date  . Boxer's fracture   . COPD (chronic obstructive pulmonary disease) (HCC)   . Hypertension   . Schizophrenic disorder (HCC)     No past surgical history on file.  Family History: No family history on file.  Social  History:  reports that he has been smoking cigarettes. He has a 37.50 pack-year smoking history. He has never used smokeless tobacco. He reports that he does not drink alcohol or use drugs.  Additional Social History:  Alcohol / Drug Use Pain Medications: See MAR Prescriptions: See MAR Over the Counter: See MAR History of alcohol / drug use?: Yes Longest period of sobriety (when/how long): 5 months Substance #1 Name of Substance 1: crack cocaine 1 - Age of First Use: 19 1 - Amount (size/oz): "a lot" 1 - Frequency: first time in 5 months 1 - Last Use / Amount: 10/30/2018  CIWA: CIWA-Ar BP: 125/79 Pulse Rate: 100 COWS:    Allergies:  Allergies  Allergen Reactions  . Haldol [Haloperidol Decanoate] Other (See Comments)    'I curl up"    Home Medications: (Not in a hospital admission)   OB/GYN Status:  No LMP for male patient.  General Assessment Data Location of Assessment: [facility] ED TTS Assessment: In system Is this a Tele or Face-to-Face Assessment?: Face-to-Face Is this an Initial Assessment or a Re-assessment for this encounter?: Initial Assessment Patient Accompanied by:: N/A Language Other than English: No Living Arrangements: Other (Comment)(home) What gender do you identify as?: Male Marital status: Single Living Arrangements: Alone Can pt return to current living arrangement?:  Yes Admission Status: Voluntary Is patient capable of signing voluntary admission?: Yes Referral Source: Self/Family/Friend Insurance type: Medicare     Crisis Care Plan Living Arrangements: Alone Legal Guardian: Other:(Self) Name of Psychiatrist: [facility] Name of Therapist: [facility]  Education Status Is patient currently in school?: No Is the patient employed, unemployed or receiving disability?: Unemployed  Risk to self with the past 6 months Suicidal Ideation: No Has patient been a risk to self within the past 6 months prior to admission? : No Suicidal Intent: No Has patient had  any suicidal intent within the past 6 months prior to admission? : No Is patient at risk for suicide?: No Suicidal Plan?: No Has patient had any suicidal plan within the past 6 months prior to admission? : No Access to Means: No What has been your use of drugs/alcohol within the last 12 months?: crack cocaine use Previous Attempts/Gestures: No How many times?: 0 Other Self Harm Risks: none Triggers for Past Attempts: None known Intentional Self Injurious Behavior: None Family Suicide History: No Recent stressful life event(s): Loss (Comment)(mom passed away a year and a half ago.) Persecutory voices/beliefs?: No Depression: No Substance abuse history and/or treatment for substance abuse?: Yes Suicide prevention information given to non-admitted patients: Yes  Risk to Others within the past 6 months Homicidal Ideation: No Does patient have any lifetime risk of violence toward others beyond the six months prior to admission? : No Thoughts of Harm to Others: No Current Homicidal Intent: No Current Homicidal Plan: No Access to Homicidal Means: No Identified Victim: Pt denies History of harm to others?: No Assessment of Violence: None Noted Violent Behavior Description: none Does patient have access to weapons?: No Criminal Charges Pending?: No Does patient have a court date: No Is patient on probation?: No  Psychosis Hallucinations: Auditory Delusions: None noted  Mental Status Report Appearance/Hygiene: In scrubs, Unremarkable Eye Contact: Fair Motor Activity: Restlessness Speech: Unremarkable Level of Consciousness: Restless Mood: Other (Comment)(restless) Affect: Other (Comment)(restless) Anxiety Level: Moderate Thought Processes: Coherent, Relevant, Thought Blocking Judgement: Partial Orientation: Person, Place, Situation Obsessive Compulsive Thoughts/Behaviors: None  Cognitive Functioning Concentration: Decreased Memory: Remote Intact Insight: Fair Impulse  Control: Fair Appetite: Good Have you had any weight changes? : No Change Sleep: Increased Total Hours of Sleep: 10 Vegetative Symptoms: None  ADLScreening [facility] Assessment Services) Patient's cognitive ability adequate to safely complete daily activities?: Yes Patient able to express need for assistance with ADLs?: Yes Independently performs ADLs?: Yes (appropriate for developmental age)  Prior Inpatient Therapy Prior Inpatient Therapy: Yes Prior Therapy Dates: 2012, 2014, 2016 Prior Therapy Facilty/Provider(s): [facility] Reason for Treatment: psychosis  Prior Outpatient Therapy Prior Outpatient Therapy: Yes Prior Therapy Dates: current Prior Therapy Facilty/Provider(s): [facility] Reason for Treatment: psychosis Does patient have an ACCT team?: No Does patient have Intensive In-House Services?  : No Does patient have [facility] services? : Yes Does patient have P4CC services?: No  ADL Screening (condition at time of admission) Patient's cognitive ability adequate to safely complete daily activities?: Yes Patient able to express need for assistance with ADLs?: Yes Independently performs ADLs?: Yes (appropriate for developmental age)       Abuse/Neglect Assessment (Assessment to be complete while patient is alone) Abuse/Neglect Assessment Can Be Completed: Unable to assess, patient is non-responsive or altered mental status(pt would not respond when asked) Values / Beliefs Cultural Requests During Hospitalization: None Spiritual Requests During Hospitalization: None Consults Spiritual Care Consult Needed: No Social Work Consult Needed: No Merchant navy officer (For Healthcare) Does Patient  Have a Medical Advance Directive?: No Would patient like information on creating a medical advance directive?: No - Patient declined          Disposition:  Disposition Initial Assessment Completed for this Encounter: Yes  PA Donell SievertSpencer Simon recommends the pt be DC and follow up with  [facility].  Pt's RN and MD were made aware of the recommendation.  On Site Evaluation by:   Reviewed with Physician:    Ottis StainGarvin, Faylene Allerton Jermaine 11/01/2018 10:26 PM

## 2018-11-01 NOTE — ED Notes (Signed)
Patient verbalizes understanding of medications and discharge instructions. No further questions at this time. VSS and patient ambulatory at discharge.   

## 2018-11-08 ENCOUNTER — Encounter: Payer: Self-pay | Admitting: Family Medicine

## 2018-11-09 ENCOUNTER — Inpatient Hospital Stay: Payer: Self-pay | Admitting: Family Medicine

## 2018-11-13 ENCOUNTER — Emergency Department (HOSPITAL_COMMUNITY): Payer: Medicare Other

## 2018-11-13 ENCOUNTER — Other Ambulatory Visit: Payer: Self-pay

## 2018-11-13 ENCOUNTER — Encounter (HOSPITAL_COMMUNITY): Payer: Self-pay | Admitting: Emergency Medicine

## 2018-11-13 ENCOUNTER — Inpatient Hospital Stay (HOSPITAL_COMMUNITY)
Admission: EM | Admit: 2018-11-13 | Discharge: 2018-11-20 | DRG: 175 | Disposition: A | Discharge status: Home Health | Payer: Medicare Other | Attending: Family Medicine | Admitting: Family Medicine

## 2018-11-13 DIAGNOSIS — Z813 Family history of other psychoactive substance abuse and dependence: Secondary | ICD-10-CM

## 2018-11-13 DIAGNOSIS — I2699 Other pulmonary embolism without acute cor pulmonale: Principal | ICD-10-CM | POA: Diagnosis present

## 2018-11-13 DIAGNOSIS — Z5900 Homelessness unspecified: Secondary | ICD-10-CM

## 2018-11-13 DIAGNOSIS — J441 Chronic obstructive pulmonary disease with (acute) exacerbation: Secondary | ICD-10-CM | POA: Diagnosis present

## 2018-11-13 DIAGNOSIS — Z86711 Personal history of pulmonary embolism: Secondary | ICD-10-CM | POA: Diagnosis not present

## 2018-11-13 DIAGNOSIS — G8929 Other chronic pain: Secondary | ICD-10-CM | POA: Diagnosis present

## 2018-11-13 DIAGNOSIS — X58XXXA Exposure to other specified factors, initial encounter: Secondary | ICD-10-CM | POA: Diagnosis not present

## 2018-11-13 DIAGNOSIS — F200 Paranoid schizophrenia: Secondary | ICD-10-CM | POA: Diagnosis present

## 2018-11-13 DIAGNOSIS — I509 Heart failure, unspecified: Secondary | ICD-10-CM | POA: Diagnosis not present

## 2018-11-13 DIAGNOSIS — J449 Chronic obstructive pulmonary disease, unspecified: Secondary | ICD-10-CM | POA: Diagnosis not present

## 2018-11-13 DIAGNOSIS — R000 Tachycardia, unspecified: Secondary | ICD-10-CM | POA: Diagnosis not present

## 2018-11-13 DIAGNOSIS — I10 Essential (primary) hypertension: Secondary | ICD-10-CM | POA: Diagnosis present

## 2018-11-13 DIAGNOSIS — Z885 Allergy status to narcotic agent status: Secondary | ICD-10-CM | POA: Diagnosis not present

## 2018-11-13 DIAGNOSIS — Z91128 Patient's intentional underdosing of medication regimen for other reason: Secondary | ICD-10-CM

## 2018-11-13 DIAGNOSIS — Z6841 Body Mass Index (BMI) 40.0 and over, adult: Secondary | ICD-10-CM | POA: Diagnosis not present

## 2018-11-13 DIAGNOSIS — F1414 Cocaine abuse with cocaine-induced mood disorder: Secondary | ICD-10-CM | POA: Diagnosis present

## 2018-11-13 DIAGNOSIS — J810 Acute pulmonary edema: Secondary | ICD-10-CM | POA: Diagnosis not present

## 2018-11-13 DIAGNOSIS — Z23 Encounter for immunization: Secondary | ICD-10-CM | POA: Diagnosis not present

## 2018-11-13 DIAGNOSIS — R0689 Other abnormalities of breathing: Secondary | ICD-10-CM | POA: Diagnosis not present

## 2018-11-13 DIAGNOSIS — M549 Dorsalgia, unspecified: Secondary | ICD-10-CM | POA: Diagnosis present

## 2018-11-13 DIAGNOSIS — I5031 Acute diastolic (congestive) heart failure: Secondary | ICD-10-CM | POA: Diagnosis not present

## 2018-11-13 DIAGNOSIS — Z79899 Other long term (current) drug therapy: Secondary | ICD-10-CM

## 2018-11-13 DIAGNOSIS — R0902 Hypoxemia: Secondary | ICD-10-CM | POA: Diagnosis not present

## 2018-11-13 DIAGNOSIS — J9601 Acute respiratory failure with hypoxia: Secondary | ICD-10-CM | POA: Diagnosis not present

## 2018-11-13 DIAGNOSIS — T45516A Underdosing of anticoagulants, initial encounter: Secondary | ICD-10-CM | POA: Diagnosis present

## 2018-11-13 DIAGNOSIS — I2609 Other pulmonary embolism with acute cor pulmonale: Secondary | ICD-10-CM | POA: Diagnosis not present

## 2018-11-13 DIAGNOSIS — E662 Morbid (severe) obesity with alveolar hypoventilation: Secondary | ICD-10-CM | POA: Diagnosis present

## 2018-11-13 DIAGNOSIS — I82452 Acute embolism and thrombosis of left peroneal vein: Secondary | ICD-10-CM | POA: Diagnosis present

## 2018-11-13 DIAGNOSIS — R0602 Shortness of breath: Secondary | ICD-10-CM

## 2018-11-13 DIAGNOSIS — Z8249 Family history of ischemic heart disease and other diseases of the circulatory system: Secondary | ICD-10-CM

## 2018-11-13 DIAGNOSIS — F17210 Nicotine dependence, cigarettes, uncomplicated: Secondary | ICD-10-CM | POA: Diagnosis present

## 2018-11-13 DIAGNOSIS — R52 Pain, unspecified: Secondary | ICD-10-CM | POA: Diagnosis not present

## 2018-11-13 DIAGNOSIS — R079 Chest pain, unspecified: Secondary | ICD-10-CM | POA: Diagnosis not present

## 2018-11-13 HISTORY — DX: Other pulmonary embolism without acute cor pulmonale: I26.99

## 2018-11-13 LAB — TROPONIN I
Troponin I: <0.03 ng/mL (ref <0.03)
Troponin I: <0.03 ng/mL (ref <0.03)
Troponin I: <0.03 ng/mL (ref <0.03)

## 2018-11-13 LAB — RAPID URINE DRUG SCREEN, HOSP PERFORMED
Amphetamines: NOT DETECTED
BARBITURATES: NOT DETECTED
Benzodiazepines: NOT DETECTED
Cocaine: NOT DETECTED
Opiates: POSITIVE — AB
Tetrahydrocannabinol: POSITIVE — AB

## 2018-11-13 LAB — COMPREHENSIVE METABOLIC PANEL
ALT: 17 U/L (ref 0–44)
AST: 32 U/L (ref 15–41)
Albumin: 3.6 g/dL (ref 3.5–5.0)
Alkaline Phosphatase: 92 U/L (ref 38–126)
Anion gap: 12 (ref 5–15)
BUN: 8 mg/dL (ref 6–20)
CO2: 23 mmol/L (ref 22–32)
Calcium: 8.9 mg/dL (ref 8.9–10.3)
Chloride: 102 mmol/L (ref 98–111)
Creatinine, Ser: 0.88 mg/dL (ref 0.61–1.24)
GFR calc Af Amer: >60 mL/min (ref >60)
GFR calc non Af Amer: >60 mL/min (ref >60)
Glucose, Bld: 146 mg/dL — ABNORMAL HIGH (ref 70–99)
Potassium: 4.8 mmol/L (ref 3.5–5.1)
Sodium: 137 mmol/L (ref 135–145)
Total Bilirubin: 2.7 mg/dL — ABNORMAL HIGH (ref 0.3–1.2)
Total Protein: 6.9 g/dL (ref 6.5–8.1)

## 2018-11-13 LAB — CBC WITH DIFFERENTIAL/PLATELET
Abs Immature Granulocytes: 0.05 10*3/uL (ref 0.00–0.07)
Basophils Absolute: 0.1 10*3/uL (ref 0.0–0.1)
Basophils Relative: 1 %
Eosinophils Absolute: 0.3 10*3/uL (ref 0.0–0.5)
Eosinophils Relative: 3 %
HCT: 48.6 % (ref 39.0–52.0)
HEMOGLOBIN: 16 g/dL (ref 13.0–17.0)
Immature Granulocytes: 1 %
LYMPHS PCT: 13 %
Lymphs Abs: 1.4 10*3/uL (ref 0.7–4.0)
MCH: 31.3 pg (ref 26.0–34.0)
MCHC: 32.9 g/dL (ref 30.0–36.0)
MCV: 95.1 fL (ref 80.0–100.0)
Monocytes Absolute: 0.6 10*3/uL (ref 0.1–1.0)
Monocytes Relative: 6 %
NEUTROS ABS: 8 10*3/uL — AB (ref 1.7–7.7)
Neutrophils Relative %: 76 %
Platelets: 145 10*3/uL — ABNORMAL LOW (ref 150–400)
RBC: 5.11 MIL/uL (ref 4.22–5.81)
RDW: 12.8 % (ref 11.5–15.5)
WBC: 10.4 10*3/uL (ref 4.0–10.5)
nRBC: 0 % (ref 0.0–0.2)

## 2018-11-13 LAB — HEPARIN LEVEL (UNFRACTIONATED)
HEPARIN UNFRACTIONATED: 0.24 [IU]/mL — AB (ref 0.30–0.70)
Heparin Unfractionated: 0.26 IU/mL — ABNORMAL LOW (ref 0.30–0.70)

## 2018-11-13 LAB — BRAIN NATRIURETIC PEPTIDE: B NATRIURETIC PEPTIDE 5: 34.1 pg/mL (ref 0.0–100.0)

## 2018-11-13 MED ORDER — HEPARIN BOLUS VIA INFUSION
6000.0000 [IU] | Freq: Once | INTRAVENOUS | Status: AC
  Administered 2018-11-13: 6000 [IU] via INTRAVENOUS
  Filled 2018-11-13: qty 6000

## 2018-11-13 MED ORDER — OLANZAPINE 10 MG PO TABS
20.0000 mg | ORAL_TABLET | Freq: Every day | ORAL | Status: DC
  Administered 2018-11-13 – 2018-11-19 (×7): 20 mg via ORAL
  Filled 2018-11-13 (×7): qty 2

## 2018-11-13 MED ORDER — ACETAMINOPHEN 650 MG RE SUPP: 650.0000 mg | Freq: Four times a day (QID) | RECTAL | Status: DC | PRN

## 2018-11-13 MED ORDER — SODIUM CHLORIDE 0.9 % IV BOLUS
1000.0000 mL | Freq: Once | INTRAVENOUS | Status: AC
  Administered 2018-11-13: 1000 mL via INTRAVENOUS

## 2018-11-13 MED ORDER — ONDANSETRON HCL 4 MG PO TABS: 4.0000 mg | ORAL_TABLET | Freq: Four times a day (QID) | ORAL | Status: DC | PRN

## 2018-11-13 MED ORDER — ALBUTEROL SULFATE (2.5 MG/3ML) 0.083% IN NEBU
3.0000 mL | INHALATION_SOLUTION | RESPIRATORY_TRACT | Status: DC | PRN
  Administered 2018-11-14: 3 mL via RESPIRATORY_TRACT

## 2018-11-13 MED ORDER — SODIUM CHLORIDE 0.9% FLUSH: 3.0000 mL | INTRAVENOUS | Status: DC | PRN

## 2018-11-13 MED ORDER — TRAMADOL HCL 50 MG PO TABS
50.0000 mg | ORAL_TABLET | Freq: Four times a day (QID) | ORAL | Status: DC | PRN
  Administered 2018-11-13 – 2018-11-19 (×6): 50 mg via ORAL
  Filled 2018-11-13 (×6): qty 1

## 2018-11-13 MED ORDER — KETOROLAC TROMETHAMINE 15 MG/ML IJ SOLN
15.0000 mg | Freq: Four times a day (QID) | INTRAMUSCULAR | Status: DC | PRN
  Administered 2018-11-13 (×2): 15 mg via INTRAVENOUS
  Filled 2018-11-13 (×2): qty 1

## 2018-11-13 MED ORDER — SODIUM CHLORIDE 0.9% FLUSH
3.0000 mL | Freq: Two times a day (BID) | INTRAVENOUS | Status: DC
  Administered 2018-11-13 – 2018-11-20 (×14): 3 mL via INTRAVENOUS

## 2018-11-13 MED ORDER — PALIPERIDONE PALMITATE ER 156 MG/ML IM SUSY
156.0000 mg | PREFILLED_SYRINGE | INTRAMUSCULAR | Status: DC
  Filled 2018-11-13: qty 1

## 2018-11-13 MED ORDER — ACETAMINOPHEN 325 MG PO TABS: 650.0000 mg | ORAL_TABLET | Freq: Four times a day (QID) | ORAL | Status: DC | PRN

## 2018-11-13 MED ORDER — MORPHINE SULFATE (PF) 4 MG/ML IV SOLN
4.0000 mg | Freq: Once | INTRAVENOUS | Status: AC
  Administered 2018-11-13: 4 mg via INTRAVENOUS
  Filled 2018-11-13: qty 1

## 2018-11-13 MED ORDER — IBUPROFEN 800 MG PO TABS: 800.0000 mg | ORAL_TABLET | Freq: Four times a day (QID) | ORAL | Status: DC | PRN

## 2018-11-13 MED ORDER — HEPARIN (PORCINE) 25000 UT/250ML-% IV SOLN
2600.0000 [IU]/h | INTRAVENOUS | Status: DC
  Administered 2018-11-13: 2000 [IU]/h via INTRAVENOUS
  Administered 2018-11-14: 2600 [IU]/h via INTRAVENOUS
  Filled 2018-11-13 (×3): qty 250

## 2018-11-13 MED ORDER — PALIPERIDONE PALMITATE ER 156 MG/ML IM SUSY
156.0000 mg | PREFILLED_SYRINGE | INTRAMUSCULAR | Status: DC
  Filled 2018-11-13 (×2): qty 1

## 2018-11-13 MED ORDER — IOPAMIDOL (ISOVUE-370) INJECTION 76%
100.0000 mL | Freq: Once | INTRAVENOUS | Status: AC | PRN
  Administered 2018-11-13: 100 mL via INTRAVENOUS

## 2018-11-13 MED ORDER — PALIPERIDONE PALMITATE ER 156 MG/ML IM SUSY: 156.0000 mg | PREFILLED_SYRINGE | INTRAMUSCULAR | Status: DC

## 2018-11-13 MED ORDER — IPRATROPIUM-ALBUTEROL 0.5-2.5 (3) MG/3ML IN SOLN
3.0000 mL | Freq: Once | RESPIRATORY_TRACT | Status: AC
  Administered 2018-11-13: 3 mL via RESPIRATORY_TRACT
  Filled 2018-11-13: qty 3

## 2018-11-13 MED ORDER — GABAPENTIN 400 MG PO CAPS
800.0000 mg | ORAL_CAPSULE | Freq: Three times a day (TID) | ORAL | Status: DC
  Administered 2018-11-13 – 2018-11-20 (×21): 800 mg via ORAL
  Filled 2018-11-13 (×21): qty 2

## 2018-11-13 MED ORDER — ALBUTEROL SULFATE (2.5 MG/3ML) 0.083% IN NEBU: 3.0000 mL | INHALATION_SOLUTION | Freq: Four times a day (QID) | RESPIRATORY_TRACT | Status: DC | PRN

## 2018-11-13 MED ORDER — LORAZEPAM 2 MG/ML IJ SOLN
1.0000 mg | Freq: Once | INTRAMUSCULAR | Status: AC
  Administered 2018-11-13: 1 mg via INTRAVENOUS
  Filled 2018-11-13: qty 1

## 2018-11-13 MED ORDER — IOPAMIDOL (ISOVUE-370) INJECTION 76%
INTRAVENOUS | Status: AC
  Filled 2018-11-13: qty 100

## 2018-11-13 MED ORDER — ONDANSETRON HCL 4 MG/2ML IJ SOLN: 4.0000 mg | Freq: Four times a day (QID) | INTRAMUSCULAR | Status: DC | PRN

## 2018-11-13 MED ORDER — IBUPROFEN 200 MG PO TABS
400.0000 mg | ORAL_TABLET | ORAL | Status: DC | PRN
  Administered 2018-11-13: 400 mg via ORAL
  Filled 2018-11-13: qty 2

## 2018-11-13 MED ORDER — SODIUM CHLORIDE 0.9 % IV SOLN: 250.0000 mL | INTRAVENOUS | Status: DC | PRN

## 2018-11-13 MED ORDER — ACETAMINOPHEN 325 MG PO TABS
650.0000 mg | ORAL_TABLET | Freq: Four times a day (QID) | ORAL | Status: DC | PRN
  Administered 2018-11-15 – 2018-11-19 (×2): 650 mg via ORAL
  Filled 2018-11-13 (×2): qty 2

## 2018-11-13 NOTE — Progress Notes (Signed)
ANTICOAGULATION CONSULT NOTE - Initial Consult  Pharmacy Consult for heparin Indication: pulmonary embolus  Allergies  Allergen Reactions  . Haldol [Haloperidol Decanoate] Other (See Comments)    'I curl up"    Patient Measurements:   Heparin Dosing Weight: 115kg  Vital Signs: Temp: 98.3 F (36.8 C) (02/17 0118) Temp Source: Oral (02/17 0118) BP: 144/95 (02/17 0515) Pulse Rate: 127 (02/17 0515)  Labs: Recent Labs    11/13/18 0147  HGB 16.0  HCT 48.6  PLT 145*  CREATININE 0.88  TROPONINI <0.03    Estimated Creatinine Clearance: 171.6 mL/min (by C-G formula based on SCr of 0.88 mg/dL).   Medical History: Past Medical History:  Diagnosis Date  . Chronic back pain   . Cocaine use disorder (HCC)   . COPD (chronic obstructive pulmonary disease) (HCC)   . History of suicidal ideation   . Hypertension   . Schizophrenic disorder (HCC)     Medications:  Infusions:  . heparin      Assessment: 42 yof presented to the ED with SOB and rib pain. Found to have a PE. To start IV heparin. Of note, he has a history of PE but did not complete full course of treatment. Baseline Hgb is WNL but platelets are slightly low. He has required large doses of heparin in the past.   Goal of Therapy:  Heparin level 0.3-0.7 units/ml Monitor platelets by anticoagulation protocol: Yes   Plan:  Heparin bolus 6000 units IV x 1 Heparin gtt 2000 units/hr Check a 6 hr heparin level Daily heparin level and CBC  Makinley Muscato, Drake Leach 11/13/2018,5:25 AM

## 2018-11-13 NOTE — Progress Notes (Signed)
[place] TEAM 1 - Stepdown/ICU TEAM  [name]  SEG:[id number] DOB: May 03, 1976 DOA: 11/13/2018 PCP: Patient, No Pcp Per    Brief Narrative:  42yo w/ a hx of paranoid schizophrenia, PE in March 2019 (only completed 1 month of anticoagulation secondary to cost issues), HTN, COPD, and cocaine abuse who presented w/ 2 days of sharp pleuritic right-sided chest pain with shortness of breath. He also reported swelling in his calves.   A similar episode 3 weeks lead to a CTA which actually showed resolved pulmonary emboli at that time.  In the ED he was noted to have hypoxia, sinus tachycardia, and tachypnea. CTA revealed PE with a pulmonary infarcts in the right middle and lower lobes.    Significant Events: March 2019 - PE tx w/ only 1 month of anticoag 10/22/18 CTangio - no PE / resolution of prior PE 11/13/18 admit w/ recurrent PE   Subjective: Pt is seen for a f/u visit.    Assessment & Plan:  Recurrent now multifocal PE w/ pulmonary infarcts RML and RLL Initial event in March was w/o clear provoking factor  Acute hypoxic resp failure   Cocaine abuse   Tobacco abuse   Paranoid Schizophrenia   HTN  Chronic back pain    DVT prophylaxis: IV heparin  Code Status: FULL CODE Family Communication:  Disposition Plan:   Consultants:  none  Antimicrobials:  None    Objective: [name] pressure (!) 107/55, pulse (!) 102, temperature 98.2 F (36.8 C), temperature source Oral, resp. rate 19, height 6' (1.829 m), weight (!) 143.7 kg, SpO2 95 %. No intake or output data in the 24 hours ending 11/13/18 1723 Filed Weights   11/13/18 0751  Weight: (!) 143.7 kg    Examination: Pt was seen for a f/u visit.    CBC: Recent Labs  Lab 11/13/18 0147  WBC 10.4  NEUTROABS 8.0*  HGB 16.0  HCT 48.6  MCV 95.1  PLT 145*   Basic Metabolic Panel: Recent Labs  Lab 11/13/18 0147  NA 137  K 4.8  CL 102  CO2 23  GLUCOSE 146*  BUN 8  CREATININE 0.88  CALCIUM 8.9    GFR: Estimated Creatinine Clearance: 162.5 mL/min (by C-G formula based on SCr of 0.88 mg/dL).  Liver Function Tests: Recent Labs  Lab 11/13/18 0147  AST 32  ALT 17  ALKPHOS 92  BILITOT 2.7*  PROT 6.9  ALBUMIN 3.6    Cardiac Enzymes: Recent Labs  Lab 11/13/18 0147 11/13/18 0545 11/13/18 1153  TROPONINI <0.03 <0.03 <0.03    HbA1C: Hgb A1c MFr Bld  Date/Time Value Ref Range Status  12/13/2017 08:10 PM 4.8 4.8 - 5.6 % Final    Comment:    (NOTE) Pre diabetes:          5.7%-6.4% Diabetes:              >6.4% Glycemic control for   <7.0% adults with diabetes   10/25/2009 07:01 AM  4.6 - 6.1 % Final   5.2 (NOTE) The ADA recommends the following therapeutic goal for glycemic control related to Hgb A1c measurement: Goal of therapy: <6.5 Hgb A1c  Reference: American Diabetes Association: Clinical Practice Recommendations 2010, Diabetes Care, 2010, 33: (Suppl  1).    Scheduled Meds: . gabapentin  800 mg Oral TID  . OLANZapine  20 mg Oral QHS  . paliperidone  156 mg Intramuscular Q30 days  . sodium chloride flush  3 mL Intravenous Q12H   Continuous Infusions: .  sodium chloride    . heparin 2,300 Units/hr (11/13/18 1610)     LOS: 0 days   Time spent: No Charge  Carl Blood, MD [facility] Office  [phone] Pager - Text Page per Amion as per below:  On-Call/Text Page:      [email or web address]  If 7PM-7AM, please contact night-coverage [email or web address] 11/13/2018, 5:23 PM

## 2018-11-13 NOTE — ED Notes (Signed)
Admitting doctor at  The bedside 

## 2018-11-13 NOTE — ED Notes (Signed)
To ct

## 2018-11-13 NOTE — ED Triage Notes (Signed)
Pt arrived EMS from home with complaints of short of breath and right sided sharp rib pain. Pain started yesterday and has got worse. Pt has hx of PE from 1 year ago and was on a blood thinner for 1 month but has not had it since then.

## 2018-11-13 NOTE — ED Notes (Signed)
Pt retuirned from c-t

## 2018-11-13 NOTE — ED Notes (Signed)
Medicine invega sustenna not available in this pharmacy ;  We also need to know when he last took this medicine.  pharmist will check into it  Not getting this am

## 2018-11-13 NOTE — ED Notes (Signed)
Heparin drip  And bollus verified by Carollee Leitz

## 2018-11-13 NOTE — Progress Notes (Signed)
ANTICOAGULATION CONSULT NOTE   Pharmacy Consult for heparin Indication: pulmonary embolus  Allergies  Allergen Reactions  . Haldol [Haloperidol Decanoate] Other (See Comments)    'I curl up"    Patient Measurements: Height: 6' (182.9 cm) Weight: (!) 316 lb 14.4 oz (143.7 kg) IBW/kg (Calculated) : 77.6 Heparin Dosing Weight: 115kg  Vital Signs: Temp: 98.2 F (36.8 C) (02/17 0751) Temp Source: Oral (02/17 0751) BP: 107/55 (02/17 0751) Pulse Rate: 102 (02/17 0751)  Labs: Recent Labs    11/13/18 0147 11/13/18 0545 11/13/18 1153  HGB 16.0  --   --   HCT 48.6  --   --   PLT 145*  --   --   HEPARINUNFRC  --   --  0.26*  CREATININE 0.88  --   --   TROPONINI <0.03 <0.03 <0.03    Estimated Creatinine Clearance: 162.5 mL/min (by C-G formula based on SCr of 0.88 mg/dL).   Medical History: Past Medical History:  Diagnosis Date  . Chronic back pain   . Cocaine use disorder (HCC)   . COPD (chronic obstructive pulmonary disease) (HCC)   . History of suicidal ideation   . Hypertension   . PE (pulmonary thromboembolism) (HCC) 10/2018   recurrent  . Schizophrenic disorder (HCC)     Medications:  Infusions:  . sodium chloride    . heparin 2,000 Units/hr (11/13/18 0555)    Assessment: 42 yof presented to the ED with SOB and rib pain. Found to have a PE. To start IV heparin. Of note, he has a history of PE but did not complete full course of treatment. Baseline Hgb is WNL but platelets are slightly low. He has required large doses of heparin in the past. .  Heparin level this afternoon was just below goal at 2000 units/hr, will make rate adjustment and recheck tonight. No bleeding noted, cbc wnl.   Goal of Therapy:  Heparin level 0.3-0.7 units/ml Monitor platelets by anticoagulation protocol: Yes   Plan:  Increase Heparin gtt 2300 units/hr Check a 6 hr heparin level Daily heparin level and CBC  Sheppard Coil PharmD., BCPS Clinical Pharmacist 11/13/2018 2:21  PM

## 2018-11-13 NOTE — ED Provider Notes (Signed)
[facility] Provider Note   CSN: [id number] Arrival date & time: 11/13/18  0110     History   Chief Complaint Chief Complaint  Patient presents with  . rib pain right side  . Shortness of Breath    HPI [name] is a 43 y.o. male.  Patient with past medical history remarkable for homelessness, schizophrenic disorder, COPD, hypertension, cocaine use disorder, and hypertension, presents to the emergency department with a chief complaint of shortness of breath.  He reports that he has had worsening shortness of breath along with some cough over the past several days.  States that his shortness of breath worsens with exertion as well as when he lies down flat.  Denies any history of CHF.  He does report a history of PE approximately 1 year ago, for which she was anticoagulated only 1 month because of financial issues.  He was seen in the emergency department about 3 weeks ago for similar symptoms and had a CT PE study which showed resolution of prior PE.  He states that he has pain on his right side, that is worsened when he takes a deep breath.  No treatments prior to arrival.  The history is provided by the patient. No language interpreter was used.  Shortness of Breath    Past Medical History:  Diagnosis Date  . Chronic back pain   . Cocaine use disorder (HCC)   . COPD (chronic obstructive pulmonary disease) (HCC)   . History of suicidal ideation   . Hypertension   . Schizophrenic disorder [facility](HCC)     Patient Active Problem List   Diagnosis Date Noted  . Pulmonary embolism (HCC) 12/13/2017  . Pulmonary embolism, bilateral (HCC)   . Paranoid schizophrenia (HCC)   . Cocaine abuse with cocaine-induced mood disorder (HCC) 06/30/2017  . Substance induced mood disorder (HCC) 11/29/2015  . Homeless   . Suicidal ideation   . Schizoaffective disorder, unspecified type (HCC)   . Schizoaffective disorder (HCC) 05/08/2013  . Cocaine abuse  (HCC) 11/11/2011    History reviewed. No pertinent surgical history.      Home Medications    Prior to Admission medications   Medication Sig Start Date End Date Taking? Authorizing Provider  albuterol (PROVENTIL HFA;VENTOLIN HFA) 108 (90 Base) MCG/ACT inhaler Inhale 1 puff into the lungs every 6 (six) hours as needed for wheezing or shortness of breath.    [provider]  gabapentin (NEURONTIN) 800 MG tablet Take 800 mg by mouth 3 (three) times daily.    [provider]  OLANZapine (ZYPREXA) 20 MG tablet Take 20 mg by mouth at bedtime.  10/02/18   [provider]  paliperidone (INVEGA SUSTENNA) 156 MG/ML SUSY injection Inject 156 mg into the muscle every 30 (thirty) days. On the 4th of each month    [provider]    Family History Family History  Family history unknown: Yes    Social History Social History   Tobacco Use  . Smoking status: Current Every Day Smoker    Packs/day: 2.50    Years: 15.00    Pack years: 37.50    Types: Cigarettes  . Smokeless tobacco: Never Used  Substance Use Topics  . Alcohol use: No    Comment: seldom  . Drug use: Yes    Types: "Crack" cocaine     Allergies   Haldol [haloperidol decanoate]   Review of Systems Review of Systems  Respiratory: Positive for shortness of breath.  All other systems reviewed and are negative.    Physical Exam Updated Vital Signs BP 135/80   Pulse (!) 128   Temp 98.3 F (36.8 C) (Oral)   Resp (!) 31   SpO2 95%   Physical Exam Vitals signs and nursing note reviewed.  Constitutional:      Appearance: He is well-developed.  HENT:     Head: Normocephalic and atraumatic.  Eyes:     General: No scleral icterus.       Right eye: No discharge.        Left eye: No discharge.     Conjunctiva/sclera: Conjunctivae normal.     Pupils: Pupils are equal, round, and reactive to light.  Neck:     Musculoskeletal: Normal range of motion and neck supple.     Vascular:  No JVD.  Cardiovascular:     Rate and Rhythm: Regular rhythm. Tachycardia present.     Heart sounds: Normal heart sounds. No murmur. No friction rub. No gallop.   Pulmonary:     Effort: Pulmonary effort is normal. No respiratory distress.     Breath sounds: Wheezing present. No rales.     Comments: Mild and expiratory wheezes in upper lobes Chest:     Chest wall: No tenderness.  Abdominal:     General: There is no distension.     Palpations: Abdomen is soft. There is no mass.     Tenderness: There is no abdominal tenderness. There is no guarding or rebound.  Musculoskeletal: Normal range of motion.        General: No tenderness.     Right lower leg: No edema.     Left lower leg: No edema.  Skin:    General: Skin is warm and dry.  Neurological:     Mental Status: He is alert and oriented to person, place, and time.  Psychiatric:        Behavior: Behavior normal.        Thought Content: Thought content normal.        Judgment: Judgment normal.      ED Treatments / Results  Labs (all labs ordered are listed, but only abnormal results are displayed) Labs Reviewed  CBC WITH DIFFERENTIAL/PLATELET - Abnormal; Notable for the following components:      Result Value   Platelets 145 (*)    Neutro Abs 8.0 (*)    All other components within normal limits  COMPREHENSIVE METABOLIC PANEL - Abnormal; Notable for the following components:   Glucose, Bld 146 (*)    Total Bilirubin 2.7 (*)    All other components within normal limits  TROPONIN I  BRAIN NATRIURETIC PEPTIDE  RAPID URINE DRUG SCREEN, HOSP PERFORMED    EKG EKG Interpretation  Date/Time:  Monday November 13 2018 01:13:52 EST Ventricular Rate:  138 PR Interval:    QRS Duration: 87 QT Interval:  286 QTC Calculation: 434 R Axis:   -77 Text Interpretation:  Sinus tachycardia Markedly posterior QRS axis Low voltage, precordial leads No acute changes No significant change since last tracing Confirmed by Derwood Kaplan  [phone]) on 11/13/2018 1:18:29 AM   Radiology Dg Chest 2 View  Result Date: 11/13/2018 CLINICAL DATA:  Initial evaluation for acute chest pain. EXAM: CHEST - 2 VIEW COMPARISON:  Prior radiograph from 11/01/2018 FINDINGS: Cardiomegaly, stable.  Mediastinal silhouette normal. Lungs hypoinflated. Diffuse vascular congestion with interstitial prominence, suggesting mild diffuse pulmonary interstitial edema. No definite pleural effusion. No consolidative opacity. No pneumothorax. No acute osseous finding.  IMPRESSION: Cardiomegaly with mild diffuse pulmonary interstitial edema, suggesting CHF. Electronically Signed   By: Rise Mu M.D.   On: 11/13/2018 02:38    Procedures Procedures (including critical care time) CRITICAL CARE Performed by: Roxy Horseman Hypoxia, tachycardia, PE requiring heparin drip  Total critical care time: 43 minutes  Critical care time was exclusive of separately billable procedures and treating other patients.  Critical care was necessary to treat or prevent imminent or life-threatening deterioration.  Critical care was time spent personally by me on the following activities: development of treatment plan with patient and/or surrogate as well as nursing, discussions with consultants, evaluation of patient's response to treatment, examination of patient, obtaining history from patient or surrogate, ordering and performing treatments and interventions, ordering and review of laboratory studies, ordering and review of radiographic studies, pulse oximetry and re-evaluation of patient's condition.  Medications Ordered in ED Medications  morphine 4 MG/ML injection 4 mg (has no administration in time range)  sodium chloride 0.9 % bolus 1,000 mL (1,000 mLs Intravenous New Bag/Given 11/13/18 0145)  ipratropium-albuterol (DUONEB) 0.5-2.5 (3) MG/3ML nebulizer solution 3 mL (3 mLs Nebulization Given 11/13/18 0146)  LORazepam (ATIVAN) injection 1 mg (1 mg Intravenous Given  11/13/18 0142)  morphine 4 MG/ML injection 4 mg (4 mg Intravenous Given 11/13/18 0142)     Initial Impression / Assessment and Plan / ED Course  I have reviewed the triage vital signs and the nursing notes.  Pertinent labs & imaging results that were available during my care of the patient were reviewed by me and considered in my medical decision making (see chart for details).     Patient with shortness of breath and cough, worsening over the past several days.  He also reports right-sided rib pain.  He has some mild wheezes on my exam, will give breathing treatment.  He does have a history of COPD.  His O2 saturation has been in the low 90s, and drops to the 80s when lying down or when asleep.  His wheezing has resolved with the breathing treatment.  Chest x-ray shows cardiomegaly with diffuse pulmonary interstitial edema suggesting CHF.  Does not have a CHF history.  He does have a PE history, but recently had a CT PE study at the end of January which was negative.  At this time, I do not feel that repeat study is indicated.  Patient seen by and discussed with Dr. Rhunette Croft, who recommends admission for SOB/hypoxia and concern for new onset CHF with lack of follow-up.  4:35 AM Discussed with Dr. Onalee Hua, who recommends repeat CT PE study.   5:16 AM PE positive with right lung infarct.  Notified Dr. Onalee Hua.  Will start heparin drip.  Final Clinical Impressions(s) / ED Diagnoses   Final diagnoses:  Shortness of breath  Other acute pulmonary embolism, unspecified whether acute cor pulmonale present [facility])    ED Discharge Orders    None       Roxy Horseman, PA-C 11/13/18 0518    Derwood Kaplan, MD 11/13/18 [phone]

## 2018-11-13 NOTE — ED Notes (Signed)
Rep;ort given to [place] rn on 6e

## 2018-11-13 NOTE — Progress Notes (Addendum)
ANTICOAGULATION CONSULT NOTE   Pharmacy Consult for heparin Indication: pulmonary embolus  Allergies  Allergen Reactions  . Haldol [Haloperidol Decanoate] Other (See Comments)    'I curl up"    Patient Measurements: Height: 6' (182.9 cm) Weight: (!) 316 lb 14.4 oz (143.7 kg) IBW/kg (Calculated) : 77.6 Heparin Dosing Weight: 115kg  Vital Signs: Temp: 98.4 F (36.9 C) (02/17 2134) Temp Source: Oral (02/17 2134) BP: 111/66 (02/17 2134) Pulse Rate: 104 (02/17 2134)  Labs: Recent Labs    11/13/18 0147 11/13/18 0545 11/13/18 1153 11/13/18 2218  HGB 16.0  --   --   --   HCT 48.6  --   --   --   PLT 145*  --   --   --   HEPARINUNFRC  --   --  0.26* 0.24*  CREATININE 0.88  --   --   --   TROPONINI <0.03 <0.03 <0.03  --     Estimated Creatinine Clearance: 162.5 mL/min (by C-G formula based on SCr of 0.88 mg/dL).   Assessment: 46 yof presented to the ED with SOB and rib pain. Found to have a PE. To start IV heparin. Of note, he has a history of PE but did not complete full course of treatment. Baseline Hgb is WNL but platelets are slightly low. He has required large doses of heparin in the past.  Heparin level remains subtherapeutic, actually slight downward trend even though gtt increased to 2300 units/hr. No issues with line or bleeding reported per RN.  Goal of Therapy:  Heparin level 0.3-0.7 units/ml Monitor platelets by anticoagulation protocol: Yes   Plan:  Increase Heparin gtt to 2600 units/hr Check a 6 hr heparin level Daily heparin level and CBC  Christoper Fabian, PharmD, BCPS Clinical pharmacist  **Pharmacist phone directory can now be found on [email or web address] ([email or web address]).  Listed under [facility]. 11/13/2018 10:57 PM   Addendum (0530): Heparin level now therapeutic (0.36) on gtt at 2600 units/hr. Will f/u 6hr confirmatory heparin level.  Christoper Fabian, PharmD, BCPS Clinical pharmacist  **Pharmacist phone directory can now be found on [email or web address] ([email or web address]).  Listed  under [facility]. 11/14/2018 5:29 AM

## 2018-11-13 NOTE — ED Notes (Signed)
Breakfast Tray Ordered. 

## 2018-11-13 NOTE — H&P (Signed)
History and Physical    [name] EQA:[id number] DOB: 1976-04-03 DOA: 11/13/2018  PCP: Patient, No Pcp Per  Patient coming from: Home  Chief Complaint: Right-sided rib pain and shortness of breath  HPI: [name] is a 43 y.o. male with medical history significant of paranoid schizophrenia, pulmonary emboli in March 2019 only completed 1 month of anticoagulation secondary to cost issues, hypertension, COPD, cocaine abuse comes in with 2 days of sharp pleuritic right-sided chest pain with associated shortness of breath.  He denies any coughing or hemoptysis.  Patient reports that he feels like he has another blood clot in his lungs this feels similar to previous.  He says that he has been having some swelling in his calves but is overall sort of a poor historian.  He denies any flulike symptoms.  Patient reports that he is really backed off on his cocaine usage and has been sober from that for about 5 months but he did slip last week which resulted in his positive drug screen last week.  Patient also reports that he had similar episode like this approximately 3 weeks ago for which he had a CTA which actually showed resolved pulmonary emboli at that time.  Patient currently has an apartment that he is written he was previously homeless and is in a much better social situation at this time.  He reports that he has a hard time getting around and getting his medications which is why he did not complete his course of blood thinners last time.  He denies any recent trauma or surgical interventions.  Patient presented with hypoxia, sinus tachycardia and tachypnea with what appeared to be pulmonary edema on his chest x-ray.  Subsequent CTA however report shows PE with a pulmonary infarct in the right middle and lower lobe.  Patient is being referred for admission for acute hypoxic respiratory failure.  Review of Systems: As per HPI otherwise 10 point review of systems negative.   Past Medical History:    Diagnosis Date  . Chronic back pain   . Cocaine use disorder (HCC)   . COPD (chronic obstructive pulmonary disease) (HCC)   . History of suicidal ideation   . Hypertension   . Schizophrenic disorder (HCC)     History reviewed. No pertinent surgical history.   reports that he has been smoking cigarettes. He has a 37.50 pack-year smoking history. He has never used smokeless tobacco. He reports current drug use. Drugs: "Crack" cocaine and Cocaine. He reports that he does not drink alcohol.  Allergies  Allergen Reactions  . Haldol [Haloperidol Decanoate] Other (See Comments)    'I curl up"    Family History  Family history unknown: Yes    Prior to Admission medications   Medication Sig Start Date End Date Taking? Authorizing Provider  albuterol (PROVENTIL HFA;VENTOLIN HFA) 108 (90 Base) MCG/ACT inhaler Inhale 1 puff into the lungs every 6 (six) hours as needed for wheezing or shortness of breath.    [provider]  gabapentin (NEURONTIN) 800 MG tablet Take 800 mg by mouth 3 (three) times daily.    [provider]  OLANZapine (ZYPREXA) 20 MG tablet Take 20 mg by mouth at bedtime.  10/02/18   [provider]  paliperidone (INVEGA SUSTENNA) 156 MG/ML SUSY injection Inject 156 mg into the muscle every 30 (thirty) days. On the 4th of each month    [provider]    Physical Exam: Vitals:   11/13/18 0415 11/13/18 0445 11/13/18 0500  11/13/18 0515  BP: 125/63 (!) 113/55 (!) 112/51 (!) 144/95  Pulse: (!) 122 (!) 106 (!) 115 (!) 127  Resp: (!) 22 (!) 28 (!) 28 (!) 22  Temp:      TempSrc:      SpO2: 90% 91% 91% (!) 89%      Constitutional: NAD, calm, comfortable Vitals:   11/13/18 0415 11/13/18 0445 11/13/18 0500 11/13/18 0515  BP: 125/63 (!) 113/55 (!) 112/51 (!) 144/95  Pulse: (!) 122 (!) 106 (!) 115 (!) 127  Resp: (!) 22 (!) 28 (!) 28 (!) 22  Temp:      TempSrc:      SpO2: 90% 91% 91% (!) 89%   Eyes: PERRL, lids and conjunctivae  normal ENMT: Mucous membranes are moist. Posterior pharynx clear of any exudate or lesions.Normal dentition.  Neck: normal, supple, no masses, no thyromegaly Respiratory: clear to auscultation bilaterally, no wheezing, no crackles. Normal respiratory effort. No accessory muscle use.  Cardiovascular: Regular rate and rhythm, no murmurs / rubs / gallops. No extremity edema. 2+ pedal pulses. No carotid bruits.  Abdomen: no tenderness, no masses palpated. No hepatosplenomegaly. Bowel sounds positive.  Musculoskeletal: no clubbing / cyanosis. No joint deformity upper and lower extremities. Good ROM, no contractures. Normal muscle tone.  Skin: no rashes, lesions, ulcers. No induration Neurologic: CN 2-12 grossly intact. Sensation intact, DTR normal. Strength 5/5 in all 4.  Psychiatric: Normal judgment and insight. Alert and oriented x 3. Normal mood.    Labs on Admission: I have personally reviewed following labs and imaging studies  CBC: Recent Labs  Lab 11/13/18 0147  WBC 10.4  NEUTROABS 8.0*  HGB 16.0  HCT 48.6  MCV 95.1  PLT 145*   Basic Metabolic Panel: Recent Labs  Lab 11/13/18 0147  NA 137  K 4.8  CL 102  CO2 23  GLUCOSE 146*  BUN 8  CREATININE 0.88  CALCIUM 8.9   GFR: Estimated Creatinine Clearance: 171.6 mL/min (by C-G formula based on SCr of 0.88 mg/dL). Liver Function Tests: Recent Labs  Lab 11/13/18 0147  AST 32  ALT 17  ALKPHOS 92  BILITOT 2.7*  PROT 6.9  ALBUMIN 3.6   No results for input(s): LIPASE, AMYLASE in the last 168 hours. No results for input(s): AMMONIA in the last 168 hours. Coagulation Profile: No results for input(s): INR, PROTIME in the last 168 hours. Cardiac Enzymes: Recent Labs  Lab 11/13/18 0147  TROPONINI <0.03   BNP (last 3 results) No results for input(s): PROBNP in the last 8760 hours. HbA1C: No results for input(s): HGBA1C in the last 72 hours. CBG: No results for input(s): GLUCAP in the last 168 hours. Lipid  Profile: No results for input(s): CHOL, HDL, LDLCALC, TRIG, CHOLHDL, LDLDIRECT in the last 72 hours. Thyroid Function Tests: No results for input(s): TSH, T4TOTAL, FREET4, T3FREE, THYROIDAB in the last 72 hours. Anemia Panel: No results for input(s): VITAMINB12, FOLATE, FERRITIN, TIBC, IRON, RETICCTPCT in the last 72 hours. Urine analysis:    Component Value Date/Time   COLORURINE YELLOW 05/31/2017 0532   APPEARANCEUR CLEAR 05/31/2017 0532   LABSPEC 1.013 05/31/2017 0532   PHURINE 5.0 05/31/2017 0532   GLUCOSEU NEGATIVE 05/31/2017 0532   HGBUR NEGATIVE 05/31/2017 0532   BILIRUBINUR NEGATIVE 05/31/2017 0532   KETONESUR NEGATIVE 05/31/2017 0532   PROTEINUR NEGATIVE 05/31/2017 0532   UROBILINOGEN 1.0 08/02/2015 1725   NITRITE NEGATIVE 05/31/2017 0532   LEUKOCYTESUR NEGATIVE 05/31/2017 0532   Sepsis Labs: !!!!!!!!!!!!!!!!!!!!!!!!!!!!!!!!!!!!!!!!!!!! @LABRCNTIP (procalcitonin:4,lacticidven:4) )No results found for  this or any previous visit (from the past 240 hour(s)).   Radiological Exams on Admission: Dg Chest 2 View  Result Date: 11/13/2018 CLINICAL DATA:  Initial evaluation for acute chest pain. EXAM: CHEST - 2 VIEW COMPARISON:  Prior radiograph from 11/01/2018 FINDINGS: Cardiomegaly, stable.  Mediastinal silhouette normal. Lungs hypoinflated. Diffuse vascular congestion with interstitial prominence, suggesting mild diffuse pulmonary interstitial edema. No definite pleural effusion. No consolidative opacity. No pneumothorax. No acute osseous finding. IMPRESSION: Cardiomegaly with mild diffuse pulmonary interstitial edema, suggesting CHF. Electronically Signed   By: Rise Mu M.D.   On: 11/13/2018 02:38   Ct Angio Chest Pe W And/or Wo Contrast  Result Date: 11/13/2018 CLINICAL DATA:  Shortness of breath and chest pain.  Cocaine use EXAM: CT ANGIOGRAPHY CHEST WITH CONTRAST TECHNIQUE: Multidetector CT imaging of the chest was performed using the standard protocol during bolus  administration of intravenous contrast. Multiplanar CT image reconstructions and MIPs were obtained to evaluate the vascular anatomy. CONTRAST:  ISOVUE-370 IOPAMIDOL (ISOVUE-370) INJECTION 76% COMPARISON:  10/22/2018 FINDINGS: Cardiovascular: Positive for acute pulmonary emboli. Occlusive filling defect seen in the posterior basal segment right lower lobe. Branching central defect seen at the interlobar pulmonary artery extending into right lower basal and medial segment right middle lobe segments. Left upper lobe clot is seen anteriorly and at the lingula. RV to LV ratio is 0.86. Heart morphology is stable from prior. No pericardial effusion. Negative aorta. Mediastinum/Nodes: Negative for adenopathy Lungs/Pleura: Wedge of ground-glass density in the subpleural right middle lobe and right lower lobe consistent with infarcts. There is also atelectasis. No pulmonary edema. Upper Abdomen: Negative Musculoskeletal: No acute finding Critical Value/emergent results were called by telephone at the time of interpretation on 11/13/2018 at 5:11 am to Dr. Derwood Kaplan , who verbally acknowledged these results. Review of the MIP images confirms the above findings. IMPRESSION: Multifocal acute pulmonary embolism with infarcts in the right middle and right lower lobes. Electronically Signed   By: Marnee Spring M.D.   On: 11/13/2018 05:17    EKG: Independently reviewed.  Sinus tachycardia no acute changes Old chart reviewed Case discussed with EDP PA Mr. Dahlia Client Case discussed with Dr. Rhunette Croft in the ED also  Assessment/Plan 43 year old male with recurrent pulmonary emboli with infarcted right lower and right middle lobe Principal Problem:   recurrent Pulmonary embolism, bilateral (HCC)  3/19 and 2/20-patient now needs lifelong anticoagulation.  Some of his tachycardia may be due to uncontrolled pain.  Provide him some Toradol and see if we can get his pain under control.  His blood pressure is stable.  CTA  shows no evidence of right heart strain and his initial troponin is normal.  Will serial his cardiac enzymes through the day.  Placed on a heparin drip for now.  If patient deteriorates clinically call PCCM however do not think he is a candidate for direct thrombolytics at this time.  Will admit to stepdown unit at least for the first 24 to 48 hours.  Supplemental oxygen as needed.  Unclear as to why  CTA a month ago showed resolved clot with incomplete treatment and now with further clot burden.  Active Problems:   Acute respiratory failure with hypoxia (HCC)-secondary to the above    Homeless-still like he is currently in a stable apartment rental situation    Cocaine abuse with cocaine-induced mood disorder (HCC)-patient denies any use.  Urine drug screen is pending.  He does admit that he slipped last week.  Says he is used  only once in the last 5 months.    Paranoid schizophrenia (HCC)-continue his Abilify appears stable at this time.  Patient does seem to have some superimposed mental disability.    Hypertension-noted.  Stable at this time    Chronic back pain-stable  Consider getting social work involved prior to his discharge to ensure he does not have any barriers to getting his medications  DVT prophylaxis: Heparin drip Code Status: Full Family Communication: None Disposition Plan: 24 to 72 hours Consults called: None Admission status: Admission   Quintessa Simmerman A MD [facility]  If 7PM-7AM, please contact night-coverage [email or web address] Password [email or web address]  11/13/2018, 5:28 AM

## 2018-11-14 ENCOUNTER — Inpatient Hospital Stay (HOSPITAL_COMMUNITY): Payer: Medicare Other

## 2018-11-14 DIAGNOSIS — R52 Pain, unspecified: Secondary | ICD-10-CM

## 2018-11-14 DIAGNOSIS — I2699 Other pulmonary embolism without acute cor pulmonale: Secondary | ICD-10-CM

## 2018-11-14 LAB — COMPREHENSIVE METABOLIC PANEL
ALT: 16 U/L (ref 0–44)
AST: 13 U/L — ABNORMAL LOW (ref 15–41)
Albumin: 3.2 g/dL — ABNORMAL LOW (ref 3.5–5.0)
Alkaline Phosphatase: 83 U/L (ref 38–126)
Anion gap: 9 (ref 5–15)
BILIRUBIN TOTAL: 0.6 mg/dL (ref 0.3–1.2)
BUN: 8 mg/dL (ref 6–20)
CO2: 28 mmol/L (ref 22–32)
Calcium: 8.7 mg/dL — ABNORMAL LOW (ref 8.9–10.3)
Chloride: 100 mmol/L (ref 98–111)
Creatinine, Ser: 0.83 mg/dL (ref 0.61–1.24)
GFR calc Af Amer: >60 mL/min (ref >60)
Glucose, Bld: 133 mg/dL — ABNORMAL HIGH (ref 70–99)
Potassium: 4 mmol/L (ref 3.5–5.1)
Sodium: 137 mmol/L (ref 135–145)
Total Protein: 6.5 g/dL (ref 6.5–8.1)

## 2018-11-14 LAB — CBC
HCT: 43.8 % (ref 39.0–52.0)
Hemoglobin: 14.7 g/dL (ref 13.0–17.0)
MCH: 32.2 pg (ref 26.0–34.0)
MCHC: 33.6 g/dL (ref 30.0–36.0)
MCV: 96.1 fL (ref 80.0–100.0)
Platelets: 132 10*3/uL — ABNORMAL LOW (ref 150–400)
RBC: 4.56 MIL/uL (ref 4.22–5.81)
RDW: 12.7 % (ref 11.5–15.5)
WBC: 8.1 10*3/uL (ref 4.0–10.5)
nRBC: 0 % (ref 0.0–0.2)

## 2018-11-14 LAB — HEPARIN LEVEL (UNFRACTIONATED): HEPARIN UNFRACTIONATED: 0.36 [IU]/mL (ref 0.30–0.70)

## 2018-11-14 MED ORDER — RIVAROXABAN 20 MG PO TABS: 20.0000 mg | ORAL_TABLET | Freq: Every day | ORAL | Status: DC

## 2018-11-14 MED ORDER — LORAZEPAM 2 MG/ML IJ SOLN
1.0000 mg | Freq: Once | INTRAMUSCULAR | Status: AC
  Administered 2018-11-15: 1 mg via INTRAVENOUS
  Filled 2018-11-14: qty 1

## 2018-11-14 MED ORDER — RIVAROXABAN 15 MG PO TABS
15.0000 mg | ORAL_TABLET | Freq: Two times a day (BID) | ORAL | Status: DC
  Administered 2018-11-14 – 2018-11-20 (×13): 15 mg via ORAL
  Filled 2018-11-14 (×13): qty 1

## 2018-11-14 NOTE — Progress Notes (Signed)
[place] TEAM 1 - Stepdown/ICU TEAM  [name]  TJQ:[id number] DOB: 08-08-1976 DOA: 11/13/2018 PCP: Patient, No Pcp Per    Brief Narrative:  43yo w/ a hx of paranoid schizophrenia, PE in March 2019 (only completed 1 month of anticoagulation secondary to cost issues), HTN, COPD, and cocaine abuse who presented w/ 2 days of sharp pleuritic right-sided chest pain with shortness of breath. He also reported swelling in his calves.   A similar episode 3 weeks lead to a CTA which actually showed resolved pulmonary emboli at that time.  In the ED he was noted to have hypoxia, sinus tachycardia, and tachypnea. CTA revealed PE with a pulmonary infarcts in the right middle and lower lobes.    Significant Events: March 2019 - PE tx w/ only 1 month of anticoag 10/22/18 CTangio - no PE / resolution of prior PE 11/13/18 admit w/ recurrent PE   Subjective: Reports that he has shortness of breath also has some chest pain.  Feels anxious.  No nausea no vomiting no fever no chills. Further review of system were limited as the patient started saying that he is schizophrenic and he gets stressed out by my questioning.  Assessment & Plan:  Recurrent now multifocal PE w/ pulmonary infarcts RML and RLL Acute lower leg DVT. Initial event in March was w/o clear provoking factor Patient was prescribed Xarelto which she took for 1 month and he ran out after that, never went to her PCP for refill. His co-pay was $3.5 back then. Case manager consulted. We will transition again to Xarelto. Lower extremity Doppler is also positive for acute DVT.  Acute hypoxic resp failure Continue incentive spirometry, continue oxygen as needed.  No evidence of pneumonia.  Cocaine abuse Avoid beta-blockers. Monitor for withdrawal.  Tobacco abuse  Nicotine patch  Paranoid Schizophrenia  Does not follow-up with psychiatry outpatient. Unsure whether he is taking his medications as well. Continue current  management. Patient will require outpatient [facility] assistance. PCP outpatient follow-up arranged by case manager.  HTN Pressure stable. Continue current management.  Chronic back pain  Currently under control.  Monitor.  DVT prophylaxis: Xarelto Code Status: FULL CODE Family Communication:  Disposition Plan:   Consultants:  none  Antimicrobials:  None    Objective: Blood pressure 103/67, pulse (!) 106, temperature 98.3 F (36.8 C), temperature source Oral, resp. rate 20, height 6' (1.829 m), weight (!) 145.7 kg, SpO2 96 %.  Intake/Output Summary (Last 24 hours) at 11/14/2018 1812 Last data filed at 11/14/2018 0300 Gross per 24 hour  Intake 500.82 ml  Output 500 ml  Net 0.82 ml   Filed Weights   11/13/18 0751 11/14/18 0536  Weight: (!) 143.7 kg (!) 145.7 kg    Examination: General: Alert, Awake and Oriented to Time, Place and Person. Appear in moderate distress, affect irritable Eyes: PERRL, Conjunctiva normal ENT: Oral Mucosa clear moist. Neck: difficult to assess  JVD, no Abnormal Mass Or lumps Cardiovascular: S1 and S2 Present, no Murmur, Peripheral Pulses Present Respiratory: increased respiratory effort, Bilateral Air entry equal and Decreased, no use of accessory muscle, bilateral  Crackles, no wheezes Abdomen: Bowel Sound present, Soft and no tenderness, no hernia Skin: no redness, no Rash, no induration Extremities: bilateral  Pedal edema, no calf tenderness Neurologic: Grossly no focal neuro deficit. Bilaterally Equal motor strength  CBC: Recent Labs  Lab 11/13/18 0147 11/14/18 0441  WBC 10.4 8.1  NEUTROABS 8.0*  --   HGB 16.0 14.7  HCT 48.6 43.8  MCV 95.1 96.1  PLT 145* 132*   Basic Metabolic Panel: Recent Labs  Lab 11/13/18 0147 11/14/18 0441  NA 137 137  K 4.8 4.0  CL 102 100  CO2 23 28  GLUCOSE 146* 133*  BUN 8 8  CREATININE 0.88 0.83  CALCIUM 8.9 8.7*   GFR: Estimated Creatinine Clearance: 173.6 mL/min (by C-G formula based on  SCr of 0.83 mg/dL).  Liver Function Tests: Recent Labs  Lab 11/13/18 0147 11/14/18 0441  AST 32 13*  ALT 17 16  ALKPHOS 92 83  BILITOT 2.7* 0.6  PROT 6.9 6.5  ALBUMIN 3.6 3.2*    Cardiac Enzymes: Recent Labs  Lab 11/13/18 0147 11/13/18 0545 11/13/18 1153  TROPONINI <0.03 <0.03 <0.03    HbA1C: Hgb A1c MFr Bld  Date/Time Value Ref Range Status  12/13/2017 08:10 PM 4.8 4.8 - 5.6 % Final    Comment:    (NOTE) Pre diabetes:          5.7%-6.4% Diabetes:              >6.4% Glycemic control for   <7.0% adults with diabetes   10/25/2009 07:01 AM  4.6 - 6.1 % Final   5.2 (NOTE) The ADA recommends the following therapeutic goal for glycemic control related to Hgb A1c measurement: Goal of therapy: <6.5 Hgb A1c  Reference: American Diabetes Association: Clinical Practice Recommendations 2010, Diabetes Care, 2010, 33: (Suppl  1).    Scheduled Meds: . gabapentin  800 mg Oral TID  . OLANZapine  20 mg Oral QHS  . Rivaroxaban  15 mg Oral BID  . [START ON 12/05/2018] rivaroxaban  20 mg Oral Q supper  . sodium chloride flush  3 mL Intravenous Q12H   Continuous Infusions: . sodium chloride       LOS: 1 day   Time spent: 35 mins Author:  Lynden Oxford, MD [facility] 11/14/2018   To reach On-call, see care teams to locate the attending and reach out to them via [email or web address]. If 7PM-7AM, please contact night-coverage If you still have difficulty reaching the attending provider, please page the [facility] (Director on Call) for [facility] on amion for assistance.   If 7PM-7AM, please contact night-coverage [email or web address] 11/14/2018, 6:12 PM

## 2018-11-14 NOTE — Discharge Instructions (Signed)
Information on my medicine - XARELTO (rivaroxaban)  This medication education was reviewed with me or my healthcare representative as part of my discharge preparation.  WHY WAS XARELTO PRESCRIBED FOR YOU? Xarelto was prescribed to treat blood clots  in your lungs (pulmonary embolism) and to reduce the risk of them occurring again.  What do you need to know about Xarelto? The starting dose is one 15 mg tablet taken TWICE daily with food for the FIRST 21 DAYS then on 12/05/2018 the dose is changed to one 20 mg tablet taken ONCE A DAY with your evening meal.  DO NOT stop taking Xarelto without talking to the health care provider who prescribed the medication.  Refill your prescription for 20 mg tablets before you run out.  After discharge, you should have regular check-up appointments with your healthcare provider that is prescribing your Xarelto.  In the future your dose may need to be changed if your kidney function changes by a significant amount.  What do you do if you miss a dose? If you are taking Xarelto TWICE DAILY and you miss a dose, take it as soon as you remember. You may take two 15 mg tablets (total 30 mg) at the same time then resume your regularly scheduled 15 mg twice daily the next day.  If you are taking Xarelto ONCE DAILY and you miss a dose, take it as soon as you remember on the same day then continue your regularly scheduled once daily regimen the next day. Do not take two doses of Xarelto at the same time.   Important Safety Information Xarelto is a blood thinner medicine that can cause bleeding. You should call your healthcare provider right away if you experience any of the following: ? Bleeding from an injury or your nose that does not stop. ? Unusual colored urine (red or dark brown) or unusual colored stools (red or black). ? Unusual bruising for unknown reasons. ? A serious fall or if you hit your head (even if there is no bleeding).  Some medicines may  interact with Xarelto and might increase your risk of bleeding while on Xarelto. To help avoid this, consult your healthcare provider or pharmacist prior to using any new prescription or non-prescription medications, including herbals, vitamins, non-steroidal anti-inflammatory drugs (NSAIDs) and supplements.  This website has more information on Xarelto: VisitDestination.com.br.

## 2018-11-14 NOTE — Progress Notes (Signed)
ANTICOAGULATION CONSULT NOTE   Pharmacy Consult for heparin > rivaroxaban Indication: pulmonary embolus  Allergies  Allergen Reactions  . Haldol [Haloperidol Decanoate] Other (See Comments)    'I curl up"    Patient Measurements: Height: 6' (182.9 cm) Weight: (!) 321 lb 4.8 oz (145.7 kg) IBW/kg (Calculated) : 77.6 Heparin Dosing Weight: 115kg  Vital Signs: Temp: 98.5 F (36.9 C) (02/18 0536) Temp Source: Oral (02/18 0536) BP: 106/62 (02/18 0536) Pulse Rate: 106 (02/18 0536)  Labs: Recent Labs    11/13/18 0147 11/13/18 0545 11/13/18 1153 11/13/18 2218 11/14/18 0441  HGB 16.0  --   --   --  14.7  HCT 48.6  --   --   --  43.8  PLT 145*  --   --   --  132*  HEPARINUNFRC  --   --  0.26* 0.24* 0.36  CREATININE 0.88  --   --   --  0.83  TROPONINI <0.03 <0.03 <0.03  --   --     Estimated Creatinine Clearance: 173.6 mL/min (by C-G formula based on SCr of 0.83 mg/dL).   Medical History: Past Medical History:  Diagnosis Date  . Chronic back pain   . Cocaine use disorder (HCC)   . COPD (chronic obstructive pulmonary disease) (HCC)   . History of suicidal ideation   . Hypertension   . PE (pulmonary thromboembolism) (HCC) 10/2018   recurrent  . Schizophrenic disorder (HCC)     Medications:  Infusions:  . sodium chloride      Assessment: 42 yof presented to the ED with SOB and rib pain. Found to have a PE. Started on  IV heparin. Of note, he has a history of PE but did not complete full course of treatment. Baseline Hgb is WNL but platelets are slightly low. He has required large doses of heparin in the past. .  Transition from heparin drip to rivaroxaban for oral AC.   Goal of Therapy:  Heparin level 0.3-0.7 units/ml Monitor platelets by anticoagulation protocol: Yes   Plan:  Stop heparin drip Rivaroxaban 15mg  BID x21 days Starting 12/05/18 begin rivaroxaban 20mg  daily   Leota Sauers Pharm.D. CPP, BCPS Clinical Pharmacist [phone] 11/14/2018 11:09  AM

## 2018-11-14 NOTE — Care Management Note (Addendum)
Case Management Note  Patient Details  Name: [name] MRN: [id number] Date of Birth: Dec 16, 1975  Subjective/Objective: Pt presented for Recurrent Pulmonary Embolus. Patient has been noncompliant with medications due to lack of transportation. Patient has Medicaid- CSW provided CM with Medicaid Transportation Information. Patient states he can't understand much and I would have to call Representative [name] @  [phone].                 Action/Plan: CM was able to reach out to [name]. CM discussed if she could assist patient with getting a delivery pharmacy set up. Per [name] she picks patients medications up at [name]. No PCP listed- CM did call [name] back to see if patient has PCP. Awaiting call back.   Expected Discharge Date:                  Expected Discharge Plan:  Home/Self Care  In-House Referral:  Clinical Social Work  Discharge planning Services  CM Consult, Medication Assistance  Post Acute Care Choice:  Home Health, Durable Medical Equioment Choice offered to: Patient, Landlord per requeest  DME Arranged: Oxygen DME Agency:  [facility]  HH Arranged:  RN, Social Work [name] Agency:  [facility]  Status of Service:  Completed, signed off  If discussed at [employer], dates discussed:    Additional Comments: 1036 11-20-18 Tomi Bamberger, RN,BSN [phone] Referral for DME sent to [name]- MD to place orders in Epic and DME 02 will be delivered to room. [facility] aware that patient will transition home today. [facility] to schedule home PCP visits for patient. HH RN to assist with medication and disease management. CSW to assist with transportation needs. TOC will deliver medications to the bedside for the patient. No further needs from CM at this time.    1328 11-14-18 Tomi Bamberger, RN,BS BSN CAM [phone] Patient has Medicare/ Medicaid- co pay for medications should be no more than $3.50. No  further needs from CM at this time.    1045  11-14-18 Tomi Bamberger, RN,BSN [phone] CM received call back from [facility]- she  Is the patients landlord. [name] states patient is without PCP. CM did discuss HH services with patient and [name]. Both are agreeable to services.  [facility] willing to accept the patient and they have a physician that is contracted to provide home visits- [name]. RN to assist with medication and disease management. CSW to assist with transportation and assistance with getting a delivery pharmacy set up for medications. No further needs from CM at this time.     1029 11-14-18 Tomi Bamberger, RN,BSN [phone] Patient is hypoxic- per staff RN. Patient will be monitored overnight for respiratory management. Patient may need HH RN/CSW for home to assess home situation and environment. CM will follow as well to see if needs DME Oxygen.   Gala Lewandowsky, RN 11/14/2018, 10:01 AM

## 2018-11-14 NOTE — Progress Notes (Signed)
Bilateral lower extremity venous duplex completed. Refer to "CV Proc" under chart review to view preliminary results.  Dr. Allena Katz made aware of critical results.  11/14/2018 11:23 AM Gertie Fey, MHA, RVT, RDCS, RDMS

## 2018-11-15 ENCOUNTER — Inpatient Hospital Stay (HOSPITAL_COMMUNITY): Payer: Medicare Other

## 2018-11-15 DIAGNOSIS — I2699 Other pulmonary embolism without acute cor pulmonale: Principal | ICD-10-CM

## 2018-11-15 DIAGNOSIS — F200 Paranoid schizophrenia: Secondary | ICD-10-CM

## 2018-11-15 DIAGNOSIS — J9601 Acute respiratory failure with hypoxia: Secondary | ICD-10-CM

## 2018-11-15 LAB — BASIC METABOLIC PANEL
Anion gap: 9 (ref 5–15)
BUN: 5 mg/dL — AB (ref 6–20)
CO2: 32 mmol/L (ref 22–32)
CREATININE: 0.69 mg/dL (ref 0.61–1.24)
Calcium: 9 mg/dL (ref 8.9–10.3)
Chloride: 94 mmol/L — ABNORMAL LOW (ref 98–111)
GFR calc Af Amer: >60 mL/min (ref >60)
GFR calc non Af Amer: >60 mL/min (ref >60)
Glucose, Bld: 176 mg/dL — ABNORMAL HIGH (ref 70–99)
Potassium: 4.5 mmol/L (ref 3.5–5.1)
Sodium: 135 mmol/L (ref 135–145)

## 2018-11-15 LAB — CBC
HCT: 43 % (ref 39.0–52.0)
HEMOGLOBIN: 14.2 g/dL (ref 13.0–17.0)
MCH: 32.1 pg (ref 26.0–34.0)
MCHC: 33 g/dL (ref 30.0–36.0)
MCV: 97.3 fL (ref 80.0–100.0)
Platelets: 142 10*3/uL — ABNORMAL LOW (ref 150–400)
RBC: 4.42 MIL/uL (ref 4.22–5.81)
RDW: 12.5 % (ref 11.5–15.5)
WBC: 8.7 10*3/uL (ref 4.0–10.5)
nRBC: 0 % (ref 0.0–0.2)

## 2018-11-15 LAB — MAGNESIUM: Magnesium: 1.9 mg/dL (ref 1.7–2.4)

## 2018-11-15 LAB — BLOOD GAS, ARTERIAL
Acid-Base Excess: 8.6 mmol/L — ABNORMAL HIGH (ref 0.0–2.0)
Bicarbonate: 35.1 mmol/L — ABNORMAL HIGH (ref 20.0–28.0)
DRAWN BY: 511851
O2 Content: 4 L/min
O2 Saturation: 94.4 %
Patient temperature: 98.6
pCO2 arterial: 74.9 mmHg (ref 32.0–48.0)
pH, Arterial: 7.293 — ABNORMAL LOW (ref 7.350–7.450)
pO2, Arterial: 73 mmHg — ABNORMAL LOW (ref 83.0–108.0)

## 2018-11-15 LAB — TROPONIN I: Troponin I: <0.03 ng/mL (ref <0.03)

## 2018-11-15 LAB — ECHOCARDIOGRAM COMPLETE
Height: 72 in
Weight: 5079.4 oz

## 2018-11-15 LAB — GLUCOSE, CAPILLARY: Glucose-Capillary: 172 mg/dL — ABNORMAL HIGH (ref 70–99)

## 2018-11-15 LAB — AMMONIA: Ammonia: 44 umol/L — ABNORMAL HIGH (ref 9–35)

## 2018-11-15 MED ORDER — IPRATROPIUM BROMIDE 0.02 % IN SOLN
0.5000 mg | Freq: Four times a day (QID) | RESPIRATORY_TRACT | Status: DC
  Administered 2018-11-15 – 2018-11-17 (×11): 0.5 mg via RESPIRATORY_TRACT
  Filled 2018-11-15 (×11): qty 2.5

## 2018-11-15 MED ORDER — LEVALBUTEROL HCL 0.63 MG/3ML IN NEBU
0.6300 mg | INHALATION_SOLUTION | Freq: Four times a day (QID) | RESPIRATORY_TRACT | Status: DC
  Administered 2018-11-15 – 2018-11-17 (×9): 0.63 mg via RESPIRATORY_TRACT
  Filled 2018-11-15 (×9): qty 3

## 2018-11-15 MED ORDER — FUROSEMIDE 10 MG/ML IJ SOLN
INTRAMUSCULAR | Status: AC
  Filled 2018-11-15: qty 4

## 2018-11-15 MED ORDER — LEVALBUTEROL HCL 0.63 MG/3ML IN NEBU
0.6300 mg | INHALATION_SOLUTION | Freq: Four times a day (QID) | RESPIRATORY_TRACT | Status: DC | PRN
  Administered 2018-11-15 (×2): 0.63 mg via RESPIRATORY_TRACT
  Filled 2018-11-15 (×2): qty 3

## 2018-11-15 MED ORDER — PERFLUTREN LIPID MICROSPHERE
1.0000 mL | INTRAVENOUS | Status: AC | PRN
  Administered 2018-11-15: 3 mL via INTRAVENOUS
  Filled 2018-11-15: qty 10

## 2018-11-15 MED ORDER — FUROSEMIDE 10 MG/ML IJ SOLN
40.0000 mg | Freq: Two times a day (BID) | INTRAMUSCULAR | Status: DC
  Administered 2018-11-15 – 2018-11-20 (×10): 40 mg via INTRAVENOUS
  Filled 2018-11-15 (×9): qty 4

## 2018-11-15 MED ORDER — PALIPERIDONE PALMITATE ER 156 MG/ML IM SUSY
156.0000 mg | PREFILLED_SYRINGE | INTRAMUSCULAR | Status: DC
  Administered 2018-11-15: 156 mg via INTRAMUSCULAR
  Filled 2018-11-15: qty 1

## 2018-11-15 MED ORDER — METHYLPREDNISOLONE SODIUM SUCC 125 MG IJ SOLR
60.0000 mg | Freq: Four times a day (QID) | INTRAMUSCULAR | Status: DC
  Administered 2018-11-15 – 2018-11-20 (×21): 60 mg via INTRAVENOUS
  Filled 2018-11-15 (×19): qty 2

## 2018-11-15 NOTE — Progress Notes (Signed)
Pt HR fluctuating from 90's to 130's. Pt had 2.3 sec pause. Pt sleeping on arrival to room. Pt asymptomatic when awakened. Cont to monitor. Will get EKG and notify Dr. Sharl Ma. Emelda Brothers RN

## 2018-11-15 NOTE — Progress Notes (Signed)
[facility]  PROGRESS NOTE  [name] T [name] [id number] DOB: 1976/03/15 DOA: 11/13/2018 PCP: [name]   Brief HPI:   43 year old male with a history of paranoid schizophrenia, pulmonary embolism in March 2019(only completed 1 month of anticoagulation due to cost issues), hypertension, COPD, cocaine abuse who presented with 2-day history of sharp pleuritic right-sided chest pain with shortness of breath.  Patient had a similar episode 3 weeks ago at that time CTA showed resolved pulmonary emboli.  In the ED patient was found to be hypoxic, sinus tachycardia and tachypnea.  CT revealed pulmonary embolism with pulmonary infarct in the right middle and lower lobes.    Subjective   Patient seen and examined, O2 sats dropped to 90% on 5 L of oxygen.  Chest x-ray this morning showed pulmonary edema.   Assessment/Plan:     1. Acute hypoxic respiratory failure-multifactorial, patient has pulmonary embolism, chest x-ray shows pulmonary edema.  Also has component of obesity hypoventilation syndrome.  Continue oxygen via nasal cannula.  Will start Lasix 40 mg IV every 12 hours.  2. Recurrent multifocal pulmonary embolism-patient has PE with pulmonary infarcts in right middle lobe and right lower lobe, initially 1 in the March was without clear provoking factor.  Continue Xarelto.  3. Acute diastolic CHF-patient likely has acute diastolic CHF, will obtain echocardiogram.  Start Lasix 40 mg IV every 12 hours.  Follow BMP in a.m.  4. Paranoid schizophrenia-continue Zyprexa, Invega 156 mg intramuscular every 30 days.  5. ? COPD-patient started on Solu-Medrol 60 mg IV every 6 hours, ipratropium nebulizer every 6 hours, Xopenex nebulizer every 6 hours.    CBC: Recent Labs  Lab 11/13/18 0147 11/14/18 0441 11/15/18 0307  WBC 10.4 8.1 8.7  NEUTROABS 8.0*  --   --   HGB 16.0 14.7 14.2  HCT 48.6 43.8 43.0  MCV 95.1 96.1 97.3  PLT 145* 132* 142*    Basic Metabolic  Panel: Recent Labs  Lab 11/13/18 0147 11/14/18 0441 11/15/18 0834  NA 137 137 135  K 4.8 4.0 4.5  CL 102 100 94*  CO2 23 28 32  GLUCOSE 146* 133* 176*  BUN 8 8 5*  CREATININE 0.88 0.83 0.69  CALCIUM 8.9 8.7* 9.0     DVT prophylaxis: IV heparin  Code Status: Full code  Family Communication: No family at bedside  Disposition Plan: likely home when medically ready for discharge     Consultants:    Procedures:     Antibiotics:   Anti-infectives (From admission, onward)   None       Objective   Vitals:   11/15/18 0330 11/15/18 0821 11/15/18 0900 11/15/18 1104  BP: 133/67     Pulse: (!) 122 (!) 126 (!) 114   Resp: (!) 21 (!) 22    Temp: 98 F (36.7 C)     TempSrc: Oral     SpO2: 94% 94% 93% 93%  Weight: (!) 144 kg     Height:        Intake/Output Summary (Last 24 hours) at 11/15/2018 1200 Last data filed at 11/15/2018 0900 Gross [name] 24 hour  Intake 835 ml  Output 1000 ml  Net -165 ml   Filed Weights   11/13/18 0751 11/14/18 0536 11/15/18 0330  Weight: (!) 143.7 kg (!) 145.7 kg (!) 144 kg     Physical Examination:    General:  Appears in no acute distress  Cardiovascular: S1-S2, regular, no murmur auscultated  Respiratory: Decreased breath sounds bilaterally  Abdomen: Abdomen is soft, nontender, no organomegaly  Extremities: No edema of the lower extremities  Neurologic: Cranial nerve II through XII grossly intact, motor strength 5/5 in all extremities     Data Reviewed: I have personally reviewed following labs and imaging studies   No results found for this or any previous visit (from the past 240 hour(s)).   Liver Function Tests: Recent Labs  Lab 11/13/18 0147 11/14/18 0441  AST 32 13*  ALT 17 16  ALKPHOS 92 83  BILITOT 2.7* 0.6  PROT 6.9 6.5  ALBUMIN 3.6 3.2*   No results for input(s): LIPASE, AMYLASE in the last 168 hours. Recent Labs  Lab 11/15/18 0834  AMMONIA 44*    Cardiac Enzymes: Recent Labs  Lab  11/13/18 0147 11/13/18 0545 11/13/18 1153  TROPONINI <0.03 <0.03 <0.03   BNP (last 3 results) Recent Labs    12/13/17 1223 11/01/18 1828 11/13/18 0147  BNP 14.7 12.8 34.1    ProBNP (last 3 results) No results for input(s): PROBNP in the last 8760 hours.    Studies: Dg Chest 2 View  Result Date: 11/15/2018 CLINICAL DATA:  Hypoxia, smoker, COPD EXAM: CHEST - 2 VIEW COMPARISON:  11/13/2018 FINDINGS: Enlargement of cardiac silhouette with pulmonary vascular congestion. Mediastinal contour stable. BILATERAL airspace infiltrates again seen favor pulmonary edema, probably not significantly changed when accounting for differences in technique. No pneumothorax. Question minimal RIGHT pleural effusion. IMPRESSION: Enlargement of cardiac silhouette with pulmonary vascular congestion and persistent pulmonary infiltrates favoring pulmonary edema. Electronically Signed   By: Ulyses Southward M.D.   On: 11/15/2018 11:23   Vas [place] Lower Extremity Venous (dvt)  Result Date: 11/14/2018  Lower Venous Study Indications: Pain, and pulmonary embolism.  Performing Technologist: Gertie Fey MHA, RDMS, RVT, RDCS  Examination Guidelines: A complete evaluation includes B-mode imaging, spectral Doppler, color Doppler, and power Doppler as needed of all accessible portions of each vessel. Bilateral testing is considered an integral part of a complete examination. Limited examinations for reoccurring indications may be performed as noted.  Right Venous Findings: +---------+---------------+---------+-----------+----------+-------+          CompressibilityPhasicitySpontaneityPropertiesSummary +---------+---------------+---------+-----------+----------+-------+ CFV      Full           Yes      Yes                          +---------+---------------+---------+-----------+----------+-------+ SFJ      Full                                                  +---------+---------------+---------+-----------+----------+-------+ FV Prox  Full                                                 +---------+---------------+---------+-----------+----------+-------+ FV Mid   Full                                                 +---------+---------------+---------+-----------+----------+-------+ FV DistalFull                                                 +---------+---------------+---------+-----------+----------+-------+  PFV      Full                                                 +---------+---------------+---------+-----------+----------+-------+ POP      Full           Yes      Yes                          +---------+---------------+---------+-----------+----------+-------+ PTV      Full                                                 +---------+---------------+---------+-----------+----------+-------+ PERO     Full                                                 +---------+---------------+---------+-----------+----------+-------+  Left Venous Findings: +---------+---------------+---------+-----------+----------+-------+          CompressibilityPhasicitySpontaneityPropertiesSummary +---------+---------------+---------+-----------+----------+-------+ CFV      Full           Yes      Yes                          +---------+---------------+---------+-----------+----------+-------+ SFJ      Full                                                 +---------+---------------+---------+-----------+----------+-------+ FV Prox  Full                                                 +---------+---------------+---------+-----------+----------+-------+ FV Mid   Full                                                 +---------+---------------+---------+-----------+----------+-------+ FV DistalFull                                                  +---------+---------------+---------+-----------+----------+-------+ PFV      Full                                                 +---------+---------------+---------+-----------+----------+-------+ POP      Full           Yes      Yes                          +---------+---------------+---------+-----------+----------+-------+  PTV      Full                                                 +---------+---------------+---------+-----------+----------+-------+ PERO     None                    No                   Acute   +---------+---------------+---------+-----------+----------+-------+    Summary: Right: There is no evidence of deep vein thrombosis in the lower extremity. No cystic structure found in the popliteal fossa. Left: Findings consistent with acute deep vein thrombosis involving the left peroneal vein. No cystic structure found in the popliteal fossa.  *See table(s) above for measurements and observations. Electronically signed by Waverly Ferrari MD on 11/14/2018 at 2:46:45 PM.    Final     Scheduled Meds: . furosemide  40 mg Intravenous Q12H  . gabapentin  800 mg Oral TID  . ipratropium  0.5 mg Nebulization Q6H  . methylPREDNISolone (SOLU-MEDROL) injection  60 mg Intravenous Q6H  . OLANZapine  20 mg Oral QHS  . paliperidone  156 mg Intramuscular Q30 days  . Rivaroxaban  15 mg Oral BID  . [START ON 12/05/2018] rivaroxaban  20 mg Oral Q supper  . sodium chloride flush  3 mL Intravenous Q12H    Admission status: Inpatient: Based on patients clinical presentation and evaluation of above clinical data, I have made determination that patient meets Inpatient criteria at this time.  Time spent: 25 min  Meredeth Ide   [facility] Pager [phone]. If 7PM-7AM, please contact night-coverage at [email or web address], Office  [phone]  password [email or web address]  11/15/2018, 12:00 PM  LOS: 2 days

## 2018-11-15 NOTE — Progress Notes (Signed)
  Echocardiogram 2D Echocardiogram has been performed.  Delcie Roch 11/15/2018, 5:38 PM

## 2018-11-15 NOTE — Progress Notes (Signed)
Notified Dr. Sharl Ma pt having increasing pauses longest being 3.08 sec. Pt asymptomatic. No new orders. Cont to monitor. Emelda Brothers [place]

## 2018-11-15 NOTE — Progress Notes (Signed)
Pt much more lethargic, hands are jerking, and pt is requiring more oxygen. Pt now on 4.5-5L to keep sats 90%. Paged Dr. Sharl Ma to see. Cont to monitor. Emelda Brothers RN

## 2018-11-15 NOTE — Progress Notes (Signed)
Pt more alert on bipap. HR less fluctuating, remains ST low 100's. Pt resting well. Cont to monitor. Emelda Brothers RN

## 2018-11-16 LAB — BASIC METABOLIC PANEL
Anion gap: 15 (ref 5–15)
BUN: 11 mg/dL (ref 6–20)
CO2: 33 mmol/L — ABNORMAL HIGH (ref 22–32)
Calcium: 9.8 mg/dL (ref 8.9–10.3)
Chloride: 89 mmol/L — ABNORMAL LOW (ref 98–111)
Creatinine, Ser: 0.73 mg/dL (ref 0.61–1.24)
GFR calc Af Amer: >60 mL/min (ref >60)
GFR calc non Af Amer: >60 mL/min (ref >60)
Glucose, Bld: 165 mg/dL — ABNORMAL HIGH (ref 70–99)
Potassium: 4.2 mmol/L (ref 3.5–5.1)
SODIUM: 137 mmol/L (ref 135–145)

## 2018-11-16 LAB — CBC
HCT: 46.7 % (ref 39.0–52.0)
Hemoglobin: 15.5 g/dL (ref 13.0–17.0)
MCH: 31.8 pg (ref 26.0–34.0)
MCHC: 33.2 g/dL (ref 30.0–36.0)
MCV: 95.9 fL (ref 80.0–100.0)
PLATELETS: 169 10*3/uL (ref 150–400)
RBC: 4.87 MIL/uL (ref 4.22–5.81)
RDW: 12.2 % (ref 11.5–15.5)
WBC: 9.3 10*3/uL (ref 4.0–10.5)
nRBC: 0 % (ref 0.0–0.2)

## 2018-11-16 NOTE — Progress Notes (Signed)
[facility]  PROGRESS NOTE  [name] DTO:[id number] DOB: 06-22-1976 DOA: 11/13/2018 PCP: Patient, No Pcp Per   Brief HPI:   43 year old male with a history of paranoid schizophrenia, pulmonary embolism in March 2019(only completed 1 month of anticoagulation due to cost issues), hypertension, COPD, cocaine abuse who presented with 2-day history of sharp pleuritic right-sided chest pain with shortness of breath.  Patient had a similar episode 3 weeks ago at that time CTA showed resolved pulmonary emboli.  In the ED patient was found to be hypoxic, sinus tachycardia and tachypnea.  CT revealed pulmonary embolism with pulmonary infarct in the right middle and lower lobes.    Subjective   Patient seen and examined, breathing is improved since started on IV Lasix.   Assessment/Plan:     1. Acute hypoxic respiratory failure-multifactorial, patient has pulmonary embolism, chest x-ray shows pulmonary edema.  Also has component of obesity hypoventilation syndrome.  Continue oxygen via nasal cannula.  Improving on Lasix 40 mg IV every 12 hours.  2. Recurrent multifocal pulmonary embolism-patient has PE with pulmonary infarcts in right middle lobe and right lower lobe, initially 1 in the March was without clear provoking factor.  Continue Xarelto.  3. ? Acute diastolic CHF-patient likely has acute diastolic CHF.  Started on  Lasix 40 mg IV every 12 hours.  Excellent diuresis with Lasix.  -2.8 L since admit.  Echocardiogram shows EF 60-65%, LVH  4. Paranoid schizophrenia-continue Zyprexa, Invega 156 mg intramuscular every 30 days.  5. ? COPD-patient started on Solu-Medrol 60 mg IV every 6 hours, ipratropium nebulizer every 6 hours, Xopenex nebulizer every 6 hours.    CBC: Recent Labs  Lab 11/13/18 0147 11/14/18 0441 11/15/18 0307 11/16/18 0340  WBC 10.4 8.1 8.7 9.3  NEUTROABS 8.0*  --   --   --   HGB 16.0 14.7 14.2 15.5  HCT 48.6 43.8 43.0 46.7  MCV 95.1 96.1 97.3 95.9  PLT  145* 132* 142* 169    Basic Metabolic Panel: Recent Labs  Lab 11/13/18 0147 11/14/18 0441 11/15/18 0834 11/15/18 1500 11/16/18 0340  NA 137 137 135  --  137  K 4.8 4.0 4.5  --  4.2  CL 102 100 94*  --  89*  CO2 23 28 32  --  33*  GLUCOSE 146* 133* 176*  --  165*  BUN 8 8 5*  --  11  CREATININE 0.88 0.83 0.69  --  0.73  CALCIUM 8.9 8.7* 9.0  --  9.8  MG  --   --   --  1.9  --      DVT prophylaxis: IV heparin  Code Status: Full code  Family Communication: No family at bedside  Disposition Plan: likely home when medically ready for discharge     Consultants:    Procedures:     Antibiotics:   Anti-infectives (From admission, onward)   None       Objective   Vitals:   11/16/18 1000 11/16/18 1100 11/16/18 1300 11/16/18 1511  BP:      Pulse: (!) 105 (!) 113 (!) 108   Resp: 18  (!) 21   Temp:      TempSrc:      SpO2: 95% 94% 93% 95%  Weight:      Height:        Intake/Output Summary (Last 24 hours) at 11/16/2018 1535 Last data filed at 11/16/2018 1511 Gross per 24 hour  Intake -  Output 2450 ml  Net -2450 ml   Filed Weights   11/14/18 0536 11/15/18 0330 11/16/18 0645  Weight: (!) 145.7 kg (!) 144 kg (!) 143.2 kg     Physical Examination:  General: Appears in no acute distress Cardiovascular: S1S2 RRR Respiratory: Decreased breath sounds at lung bases Abdomen: Abdominal soft, nontender, no organomegaly Extremities:  No edema in lower extremitues  Neurologic:  AO x3, no focal defecit       Data Reviewed: I have personally reviewed following labs and imaging studies   No results found for this or any previous visit (from the past 240 hour(s)).   Liver Function Tests: Recent Labs  Lab 11/13/18 0147 11/14/18 0441  AST 32 13*  ALT 17 16  ALKPHOS 92 83  BILITOT 2.7* 0.6  PROT 6.9 6.5  ALBUMIN 3.6 3.2*   No results for input(s): LIPASE, AMYLASE in the last 168 hours. Recent Labs  Lab 11/15/18 0834  AMMONIA 44*     Cardiac Enzymes: Recent Labs  Lab 11/13/18 0147 11/13/18 0545 11/13/18 1153 11/15/18 1500  TROPONINI <0.03 <0.03 <0.03 <0.03   BNP (last 3 results) Recent Labs    12/13/17 1223 11/01/18 1828 11/13/18 0147  BNP 14.7 12.8 34.1    ProBNP (last 3 results) No results for input(s): PROBNP in the last 8760 hours.    Studies: Dg Chest 2 View  Result Date: 11/15/2018 CLINICAL DATA:  Hypoxia, smoker, COPD EXAM: CHEST - 2 VIEW COMPARISON:  11/13/2018 FINDINGS: Enlargement of cardiac silhouette with pulmonary vascular congestion. Mediastinal contour stable. BILATERAL airspace infiltrates again seen favor pulmonary edema, probably not significantly changed when accounting for differences in technique. No pneumothorax. Question minimal RIGHT pleural effusion. IMPRESSION: Enlargement of cardiac silhouette with pulmonary vascular congestion and persistent pulmonary infiltrates favoring pulmonary edema. Electronically Signed   By: Ulyses Southward M.D.   On: 11/15/2018 11:23    Scheduled Meds: . furosemide  40 mg Intravenous Q12H  . gabapentin  800 mg Oral TID  . ipratropium  0.5 mg Nebulization Q6H  . levalbuterol  0.63 mg Nebulization Q6H  . methylPREDNISolone (SOLU-MEDROL) injection  60 mg Intravenous Q6H  . OLANZapine  20 mg Oral QHS  . paliperidone  156 mg Intramuscular Q30 days  . Rivaroxaban  15 mg Oral BID  . [START ON 12/05/2018] rivaroxaban  20 mg Oral Q supper  . sodium chloride flush  3 mL Intravenous Q12H    Admission status: Inpatient: Based on patients clinical presentation and evaluation of above clinical data, I have made determination that patient meets Inpatient criteria at this time.  Time spent: 25 min  Meredeth Ide   [facility] Pager [phone]. If 7PM-7AM, please contact night-coverage at [email or web address], Office  [phone]  password [email or web address]  11/16/2018, 3:35 PM  LOS: 3 days

## 2018-11-16 NOTE — Care Management Important Message (Signed)
Important Message  Patient Details  Name: CAMRIN REAGAN MRN: [id number] Date of Birth: 10/27/1975   Medicare Important Message Given:  Yes    Kearra Calkin P Dariusz Brase 11/16/2018, 12:03 PM

## 2018-11-16 NOTE — Plan of Care (Signed)
  Problem: Clinical Measurements: Goal: Ability to maintain clinical measurements within normal limits will improve Outcome: Progressing   

## 2018-11-16 NOTE — Progress Notes (Signed)
On arrival, patient on 3L [place] sats 89%. Breath sounds clear and diminished bilaterally. Gave patient breathing treatment and placed on 5L [place]. Patient in no distress at this time. Will keep patient on [place]. Will continue to monitor.

## 2018-11-17 ENCOUNTER — Encounter (HOSPITAL_COMMUNITY): Payer: Self-pay | Admitting: General Practice

## 2018-11-17 DIAGNOSIS — I5031 Acute diastolic (congestive) heart failure: Secondary | ICD-10-CM

## 2018-11-17 DIAGNOSIS — R000 Tachycardia, unspecified: Secondary | ICD-10-CM

## 2018-11-17 DIAGNOSIS — R0602 Shortness of breath: Secondary | ICD-10-CM

## 2018-11-17 LAB — BASIC METABOLIC PANEL
ANION GAP: 11 (ref 5–15)
BUN: 18 mg/dL (ref 6–20)
CO2: 40 mmol/L — AB (ref 22–32)
Calcium: 9.4 mg/dL (ref 8.9–10.3)
Chloride: 89 mmol/L — ABNORMAL LOW (ref 98–111)
Creatinine, Ser: 0.8 mg/dL (ref 0.61–1.24)
GFR calc Af Amer: >60 mL/min (ref >60)
GFR calc non Af Amer: >60 mL/min (ref >60)
Glucose, Bld: 173 mg/dL — ABNORMAL HIGH (ref 70–99)
Potassium: 4.3 mmol/L (ref 3.5–5.1)
Sodium: 140 mmol/L (ref 135–145)

## 2018-11-17 LAB — CBC
HEMATOCRIT: 45.9 % (ref 39.0–52.0)
Hemoglobin: 15.1 g/dL (ref 13.0–17.0)
MCH: 31.7 pg (ref 26.0–34.0)
MCHC: 32.9 g/dL (ref 30.0–36.0)
MCV: 96.4 fL (ref 80.0–100.0)
Platelets: 203 10*3/uL (ref 150–400)
RBC: 4.76 MIL/uL (ref 4.22–5.81)
RDW: 12.2 % (ref 11.5–15.5)
WBC: 11.1 10*3/uL — ABNORMAL HIGH (ref 4.0–10.5)
nRBC: 0 % (ref 0.0–0.2)

## 2018-11-17 MED ORDER — PNEUMOCOCCAL VAC POLYVALENT 25 MCG/0.5ML IJ INJ
0.5000 mL | INJECTION | INTRAMUSCULAR | Status: AC
  Administered 2018-11-20: 0.5 mL via INTRAMUSCULAR

## 2018-11-17 MED ORDER — LEVALBUTEROL HCL 0.63 MG/3ML IN NEBU: 0.6300 mg | INHALATION_SOLUTION | RESPIRATORY_TRACT | Status: DC | PRN

## 2018-11-17 NOTE — Progress Notes (Signed)
On arrival, patient on 6L  sats 80%. Breath sounds inspiratory and expiratory wheezes throughout all lung fields. More diminished in the bases. No distress noted. Breathing treatment given at this time. Placed patient on 8L [place] post treatment. Sats maintaining at 93%. Will continue to monitor.

## 2018-11-17 NOTE — Consult Note (Addendum)
Cardiology Consultation:   Patient ID: EGHOSA PHETTEPLACE MRN: [id number]; DOB: 05/01/1976  Admit date: 11/13/2018 Date of Consult: 11/17/2018  Primary Care Provider: Patient, No Pcp Per Primary Cardiologist: No primary care provider on file. New Primary Electrophysiologist:  None    Patient Profile:   BRODIX SALVINO is a 43 y.o. male with a hx of paranoid schizophrenia, PE 11/2017 with only one month of anticoagulation due to cost, HTN, COPD, and cocaine abuse admitted 11/13/18 for sharp pleuritic Rt chest pain and SOB who is being seen today for the evaluation of SOB at the request of Dr. Sharl Ma.  History of Present Illness:   Mr. Kudrick with above hx and he also reported he felt he had another PE.  3 weeks ago CTA was neg for PE.   He stated he decreased cocaine use only once in 5 months.   On arrival he had hypoxia, ST and tachypnea.  He also was found on CTA to have PE with pulmonary infarct in Rt middle and lower lobe.  + tobacco use.   On the 17th his SP02 on RA 86 to 95, BP was stable and ST to 120.    CXR with pulmonary edema  Echo with no acute changes from 11/2017.  EF 60-65%  No RV strain is noted.    EKG:  The EKG was personally reviewed and demonstrates:  ST with no acute changes.  This on the 17th. And today SR with arrhythmia no acute changes but HR is 93.    Telemetry:  Telemetry was personally reviewed and demonstrates:  SR to ST and sinus arrhythmia  He has diuresed.  On lasix  No chest pain and no SOB.  He is now on medicaid and medicare.  UDS + marijuana no cocaine.  No hypercoagulable panel  Past Medical History:  Diagnosis Date  . Chronic back pain   . Cocaine use disorder (HCC)   . COPD (chronic obstructive pulmonary disease) (HCC)   . History of suicidal ideation   . Hypertension   . PE (pulmonary thromboembolism) (HCC) 10/2018   recurrent  . Schizophrenic disorder (HCC)     History reviewed. No pertinent surgical history.   Home Medications:  Prior to  Admission medications   Medication Sig Start Date End Date Taking? Authorizing Provider  albuterol (PROVENTIL HFA;VENTOLIN HFA) 108 (90 Base) MCG/ACT inhaler Inhale 1 puff into the lungs every 6 (six) hours as needed for wheezing or shortness of breath.   Yes [provider]  gabapentin (NEURONTIN) 800 MG tablet Take 800 mg by mouth 3 (three) times daily.   Yes [provider]  OLANZapine (ZYPREXA) 20 MG tablet Take 20 mg by mouth at bedtime.  10/02/18  Yes [provider]  paliperidone (INVEGA SUSTENNA) 156 MG/ML SUSY injection Inject 156 mg into the muscle every 30 (thirty) days. On the 4th of each month   Yes [provider]    Inpatient Medications: Scheduled Meds: . furosemide  40 mg Intravenous Q12H  . gabapentin  800 mg Oral TID  . ipratropium  0.5 mg Nebulization Q6H  . levalbuterol  0.63 mg Nebulization Q6H  . methylPREDNISolone (SOLU-MEDROL) injection  60 mg Intravenous Q6H  . OLANZapine  20 mg Oral QHS  . paliperidone  156 mg Intramuscular Q30 days  . [START ON 11/18/2018] pneumococcal 23 valent vaccine  0.5 mL Intramuscular Tomorrow-1000  . Rivaroxaban  15 mg Oral BID  . [START ON 12/05/2018] rivaroxaban  20 mg Oral Q supper  .  sodium chloride flush  3 mL Intravenous Q12H   Continuous Infusions: . sodium chloride     PRN Meds: sodium chloride, acetaminophen, levalbuterol, ondansetron **OR** ondansetron (ZOFRAN) IV, sodium chloride flush, traMADol  Allergies:    Allergies  Allergen Reactions  . Haldol [Haloperidol Decanoate] Other (See Comments)    'I curl up"    Social History:   Social History   Socioeconomic History  . Marital status: Single    Spouse name: Not on file  . Number of children: Not on file  . Years of education: Not on file  . Highest education level: Not on file  Occupational History  . Not on file  Social Needs  . Financial resource strain: Not on file  . Food insecurity:    Worry: Not on file     Inability: Not on file  . Transportation needs:    Medical: Not on file    Non-medical: Not on file  Tobacco Use  . Smoking status: Current Every Day Smoker    Packs/day: 2.50    Years: 15.00    Pack years: 37.50    Types: Cigarettes  . Smokeless tobacco: Never Used  Substance and Sexual Activity  . Alcohol use: No    Comment: seldom  . Drug use: Yes    Types: "Crack" cocaine, Cocaine  . Sexual activity: Never  Lifestyle  . Physical activity:    Days per week: Not on file    Minutes per session: Not on file  . Stress: Not on file  Relationships  . Social connections:    Talks on phone: Not on file    Gets together: Not on file    Attends religious service: Not on file    Active member of club or organization: Not on file    Attends meetings of clubs or organizations: Not on file    Relationship status: Not on file  . Intimate partner violence:    Fear of current or ex partner: Not on file    Emotionally abused: Not on file    Physically abused: Not on file    Forced sexual activity: Not on file  Other Topics Concern  . Not on file  Social History Narrative   ** Merged History Encounter **        Family History:    Family History  Problem Relation Age of Onset  . Cancer Mother   . Heart disease Father   . Drug abuse Brother      ROS:  Please see the history of present illness.  General:no colds or fevers, no weight changes Skin:no rashes or ulcers HEENT:no blurred vision, no congestion CV:see HPI PUL:see HPI GI:no diarrhea constipation or melena, no indigestion GU:no hematuria, no dysuria MS:no joint pain, no claudication Neuro:no syncope, no lightheadedness Endo:no diabetes, no thyroid disease  All other ROS reviewed and negative.     Physical Exam/Data:   Vitals:   11/17/18 0637 11/17/18 0907 11/17/18 1108 11/17/18 1442  BP: 116/70  127/76   Pulse: (!) 101  97   Resp: (!) 21  14   Temp: 97.7 F (36.5 C)  97.8 F (36.6 C)   TempSrc: Oral   Oral   SpO2:  95% 93% 96%  Weight: (!) 144.6 kg     Height:        Intake/Output Summary (Last 24 hours) at 11/17/2018 1716 Last data filed at 11/17/2018 1500 Gross per 24 hour  Intake 240 ml  Output 5244 ml  Net -5004 ml   Last 3 Weights 11/17/2018 11/16/2018 11/15/2018  Weight (lbs) 318 lb 12.6 oz 315 lb 11.2 oz 317 lb 7.4 oz  Weight (kg) 144.6 kg 143.2 kg 144 kg  Some encounter information is confidential and restricted. Go to Review Flowsheets activity to see all data.     Body mass index is 43.24 kg/m.  General:  Well nourished, well developed, in no acute distress HEENT: normal Lymph: no adenopathy Neck: no JVD Endocrine:  No thryomegaly Vascular: No carotid bruits; pedal pulses 2+ bilaterally Cardiac:  normal S1, S2; RRR; no murmur gallup rub or click Lungs:  clear to auscultation bilaterally, somewhat diminished no wheezing, rhonchi or rales  Abd: soft, nontender, no hepatomegaly  Ext: no edema Musculoskeletal:  No deformities, BUE and BLE strength normal and equal Skin: warm and dry  Neuro:  Alert and oriented X 3 MAE follows commands, no focal abnormalities noted Psych:  Normal affect    Relevant CV Studies: Echo 11/15/18  IMPRESSIONS    1. The left ventricle has normal systolic function with an ejection fraction of 60-65%. The cavity size was normal. Left ventricular diastolic parameters were normal.  2. The right ventricle has normal systolic function. The cavity was normal. There is no increase in right ventricular wall thickness.  3. Not well seen but appears normal in size and no evidence of cor pulmonale.  4. The mitral valve is normal in structure. Mild thickening of the mitral valve leaflet.  5. The tricuspid valve is normal in structure.  6. The aortic valve is tricuspid Mild thickening of the aortic valve Mild calcification of the aortic valve.  7. The pulmonic valve was normal in structure.  FINDINGS  Left Ventricle: The left ventricle has normal  systolic function, with an ejection fraction of 60-65%. The cavity size was normal. There is no increase in left ventricular wall thickness. Left ventricular diastolic parameters were normal Definity  contrast agent was given IV to delineate the left ventricular endocardial borders. Right Ventricle: The right ventricle has normal systolic function. The cavity was normal. There is no increase in right ventricular wall thickness. Not well seen but appears normal in size and no evidence of cor pulmonale. Left Atrium: left atrial size was normal in size Right Atrium: right atrial size was normal in size. Right atrial pressure is estimated at 3 mmHg. Interatrial Septum: No atrial level shunt detected by color flow Doppler. Pericardium: There is no evidence of pericardial effusion. Mitral Valve: The mitral valve is normal in structure. Mild thickening of the mitral valve leaflet. Mitral valve regurgitation is trivial by color flow Doppler. Tricuspid Valve: The tricuspid valve is normal in structure. Tricuspid valve regurgitation was not visualized by color flow Doppler. Aortic Valve: The aortic valve is tricuspid Mild thickening of the aortic valve Mild calcification of the aortic valve. Aortic valve regurgitation was not visualized by color flow Doppler. There is no evidence of aortic valve stenosis. Pulmonic Valve: The pulmonic valve was normal in structure. Pulmonic valve regurgitation is not visualized by color flow Doppler. Venous: The inferior vena cava is normal in size with greater than 50% respiratory variability.     Laboratory Data:  Chemistry Recent Labs  Lab 11/15/18 0834 11/16/18 0340 11/17/18 0807  NA 135 137 140  K 4.5 4.2 4.3  CL 94* 89* 89*  CO2 32 33* 40*  GLUCOSE 176* 165* 173*  BUN 5* 11 18  CREATININE 0.69 0.73 0.80  CALCIUM 9.0 9.8 9.4  GFRNONAA >  60 >60 >60  GFRAA >60 >60 >60  ANIONGAP 9 15 11     Recent Labs  Lab 11/13/18 0147 11/14/18 0441  PROT 6.9 6.5    ALBUMIN 3.6 3.2*  AST 32 13*  ALT 17 16  ALKPHOS 92 83  BILITOT 2.7* 0.6   Hematology Recent Labs  Lab 11/15/18 0307 11/16/18 0340 11/17/18 0552  WBC 8.7 9.3 11.1*  RBC 4.42 4.87 4.76  HGB 14.2 15.5 15.1  HCT 43.0 46.7 45.9  MCV 97.3 95.9 96.4  MCH 32.1 31.8 31.7  MCHC 33.0 33.2 32.9  RDW 12.5 12.2 12.2  PLT 142* 169 203   Cardiac Enzymes Recent Labs  Lab 11/13/18 0147 11/13/18 0545 11/13/18 1153 11/15/18 1500  TROPONINI <0.03 <0.03 <0.03 <0.03   No results for input(s): TROPIPOC in the last 168 hours.  BNP Recent Labs  Lab 11/13/18 0147  BNP 34.1    DDimer No results for input(s): DDIMER in the last 168 hours.  Radiology/Studies:  Dg Chest 2 View  Result Date: 11/15/2018 CLINICAL DATA:  Hypoxia, smoker, COPD EXAM: CHEST - 2 VIEW COMPARISON:  11/13/2018 FINDINGS: Enlargement of cardiac silhouette with pulmonary vascular congestion. Mediastinal contour stable. BILATERAL airspace infiltrates again seen favor pulmonary edema, probably not significantly changed when accounting for differences in technique. No pneumothorax. Question minimal RIGHT pleural effusion. IMPRESSION: Enlargement of cardiac silhouette with pulmonary vascular congestion and persistent pulmonary infiltrates favoring pulmonary edema. Electronically Signed   By: Ulyses SouthwardMark  Boles M.D.   On: 11/15/2018 11:23   Vas [place] Lower Extremity Venous (dvt)  Result Date: 11/14/2018  Lower Venous Study Indications: Pain, and pulmonary embolism.  Performing Technologist: Gertie FeyMichelle Simonetti MHA, RDMS, RVT, RDCS  Examination Guidelines: A complete evaluation includes B-mode imaging, spectral Doppler, color Doppler, and power Doppler as needed of all accessible portions of each vessel. Bilateral testing is considered an integral part of a complete examination. Limited examinations for reoccurring indications may be performed as noted.  Right Venous Findings: +---------+---------------+---------+-----------+----------+-------+           CompressibilityPhasicitySpontaneityPropertiesSummary +---------+---------------+---------+-----------+----------+-------+ CFV      Full           Yes      Yes                          +---------+---------------+---------+-----------+----------+-------+ SFJ      Full                                                 +---------+---------------+---------+-----------+----------+-------+ FV Prox  Full                                                 +---------+---------------+---------+-----------+----------+-------+ FV Mid   Full                                                 +---------+---------------+---------+-----------+----------+-------+ FV DistalFull                                                 +---------+---------------+---------+-----------+----------+-------+  PFV      Full                                                 +---------+---------------+---------+-----------+----------+-------+ POP      Full           Yes      Yes                          +---------+---------------+---------+-----------+----------+-------+ PTV      Full                                                 +---------+---------------+---------+-----------+----------+-------+ PERO     Full                                                 +---------+---------------+---------+-----------+----------+-------+  Left Venous Findings: +---------+---------------+---------+-----------+----------+-------+          CompressibilityPhasicitySpontaneityPropertiesSummary +---------+---------------+---------+-----------+----------+-------+ CFV      Full           Yes      Yes                          +---------+---------------+---------+-----------+----------+-------+ SFJ      Full                                                 +---------+---------------+---------+-----------+----------+-------+ FV Prox  Full                                                  +---------+---------------+---------+-----------+----------+-------+ FV Mid   Full                                                 +---------+---------------+---------+-----------+----------+-------+ FV DistalFull                                                 +---------+---------------+---------+-----------+----------+-------+ PFV      Full                                                 +---------+---------------+---------+-----------+----------+-------+ POP      Full           Yes      Yes                          +---------+---------------+---------+-----------+----------+-------+  PTV      Full                                                 +---------+---------------+---------+-----------+----------+-------+ PERO     None                    No                   Acute   +---------+---------------+---------+-----------+----------+-------+    Summary: Right: There is no evidence of deep vein thrombosis in the lower extremity. No cystic structure found in the popliteal fossa. Left: Findings consistent with acute deep vein thrombosis involving the left peroneal vein. No cystic structure found in the popliteal fossa.  *See table(s) above for measurements and observations. Electronically signed by Waverly Ferrari MD on 11/14/2018 at 2:46:45 PM.    Final     Assessment and Plan:   1. PE with Infarct. Dr. Tresa Endo to see. On xarelto + DVT involving the lt peroneal vein   2. Acute pulmonary edema, on lasix 40 mg eery 12 hours.  Neg [id number] since admit and wt down from pk from 145.7 to 144.6 Kg. On CTA there was no pulmonary edema though CXR noted CHF.  3. Acute hypoxic resp. Failure with PE and Pulmonary edema.   4. Copd exacerbation on steroids and nebs.  5. Paranoid schizophrenia on zyprexa, Invega per primary       For questions or updates, please contact [facility] Please consult [email or web address] for contact info under   Signed, Nada Boozer, NP   11/17/2018 5:16 PM    Patient seen and examined. Agree with assessment and plan.  Mr. Taubman is a 43 year old obese gentleman who has a history of paranoid schizophrenia, remote history of PE in March 2019, history of hypertension, COPD, who was recently admitted with recurrent increasing shortness of breath.  His most recent CT angios of his chest on November 13, 2018 was notable for multifocal acute pulmonary embolism with infarcts in the right middle and right lower lobes.  He is now on Xarelto 15 mg twice a day for plan 21 days and ultimate with ultimate plans to switch to 20 mg therapy daily.  With his recurrent events that should be continued indefinitely.  Presently he denies chest tightness or anginal type symptoms.  His shortness of breath is contributed by his ulnar embolism/pulmonary infarction.  Echo Doppler assessment shows normal LV systolic function with an EF of 60 to 65%.  There was no evidence for RV strain and he had normal RV function on his echo cardiographic assessment.  The patient's body habitus is also very highly suggestive of obstructive sleep apnea.  Upon further questioning he does snore.  He has frequent awakenings.  He will ultimately need a diagnostic polysomnogram and sleep evaluation which we can arrange as an outpatient.  Troponins are negative.  Recent CO2 was 40.  He may benefit from acetazolamide for several doses.  He now has been negative with Lasix with net -7878 weight reduction from 321.3 to  315.7.  Present he is tachycardic with sinus tachycardia at 115 bpm.  Mallampati scale is a 3/4.  He has very thick neck.  There is no wheezing.  Rhythm is tachycardic with 1/6 systolic murmur there is no rub.  He has significant central adiposity.  Trace lower  extremity edema.   Troponinc are negative.  Patient notes increasing shortness of breath, also consider pulmonary evaluation.   Lennette Biharihomas A. Amelie Caracci, MD, [facility] 11/17/2018 5:49 PM

## 2018-11-17 NOTE — Progress Notes (Signed)
[facility]  PROGRESS NOTE  [name] CVE:[id number] DOB: [birth year] DOA: 11/13/2018 PCP: Patient, No Pcp Per   Brief HPI:   43 year old male with a history of paranoid schizophrenia, pulmonary embolism in March 2019(only completed 1 month of anticoagulation due to cost issues), hypertension, COPD, cocaine abuse who presented with 2-day history of sharp pleuritic right-sided chest pain with shortness of breath.  Patient had a similar episode 3 weeks ago at that time CTA showed resolved pulmonary emboli.  In the ED patient was found to be hypoxic, sinus tachycardia and tachypnea.  CT revealed pulmonary embolism with pulmonary infarct in the right middle and lower lobes.    Subjective   Patient seen and examined, breathing is improved.  Patient has diuresed very well with IV Lasix.  Still requiring 6 2 L of oxygen high flow nasal cannula.  He is off BiPAP now.   Assessment/Plan:     1. Acute hypoxic respiratory failure-multifactorial, patient has pulmonary embolism, chest x-ray shows pulmonary edema.  Also has component of obesity hypoventilation syndrome.  Continue oxygen via nasal cannula.  Improving on Lasix 40 mg IV every 12 hours.  2. Recurrent multifocal pulmonary embolism-patient has PE with pulmonary infarcts in right middle lobe and right lower lobe, initially 1 in the March was without clear provoking factor.  Continue Xarelto.  3. ? Acute diastolic CHF-patient likely has acute diastolic CHF.  Started on  Lasix 40 mg IV every 12 hours.  Excellent diuresis with Lasix.  -7.8 L since admit.  Echocardiogram shows EF 60-65%, LVH.  Will consult cardiology for further recommendations.  4. Paranoid schizophrenia-continue Zyprexa, Invega 156 mg intramuscular every 30 days.  5. ? COPD-patient started on Solu-Medrol 60 mg IV every 6 hours, ipratropium nebulizer every 6 hours, Xopenex nebulizer every 6 hours.    CBC: Recent Labs  Lab 11/13/18 0147 11/14/18 0441  11/15/18 0307 11/16/18 0340 11/17/18 0552  WBC 10.4 8.1 8.7 9.3 11.1*  NEUTROABS 8.0*  --   --   --   --   HGB 16.0 14.7 14.2 15.5 15.1  HCT 48.6 43.8 43.0 46.7 45.9  MCV 95.1 96.1 97.3 95.9 96.4  PLT 145* 132* 142* 169 203    Basic Metabolic Panel: Recent Labs  Lab 11/13/18 0147 11/14/18 0441 11/15/18 0834 11/15/18 1500 11/16/18 0340 11/17/18 0807  NA 137 137 135  --  137 140  K 4.8 4.0 4.5  --  4.2 4.3  CL 102 100 94*  --  89* 89*  CO2 23 28 32  --  33* 40*  GLUCOSE 146* 133* 176*  --  165* 173*  BUN 8 8 5*  --  11 18  CREATININE 0.88 0.83 0.69  --  0.73 0.80  CALCIUM 8.9 8.7* 9.0  --  9.8 9.4  MG  --   --   --  1.9  --   --      DVT prophylaxis: IV heparin  Code Status: Full code  Family Communication: No family at bedside  Disposition Plan: likely home when medically ready for discharge     Consultants:    Procedures:     Antibiotics:   Anti-infectives (From admission, onward)   None       Objective   Vitals:   11/17/18 0637 11/17/18 0907 11/17/18 1108 11/17/18 1442  BP: 116/70  127/76   Pulse: (!) 101  97   Resp: (!) 21  14   Temp: 97.7 F (36.5 C)  97.8 F (  36.6 C)   TempSrc: Oral  Oral   SpO2:  95% 93% 96%  Weight: (!) 144.6 kg     Height:        Intake/Output Summary (Last 24 hours) at 11/17/2018 1539 Last data filed at 11/17/2018 1500 Gross per 24 hour  Intake 240 ml  Output 5244 ml  Net -5004 ml   Filed Weights   11/15/18 0330 11/16/18 0645 11/17/18 0637  Weight: (!) 144 kg (!) 143.2 kg (!) 144.6 kg     Physical Examination:   General: Appears in no acute distress  Cardiovascular: S1-S2, regular  Respiratory: Decreased breath sounds bilaterally  Abdomen: Abdomen is soft, nontender, no organomegaly  Extremities: No edema in the lower extremities  Neurologic: Alert, x3, no focal deficit noted      Data Reviewed: I have personally reviewed following labs and imaging studies   No results found for this  or any previous visit (from the past 240 hour(s)).   Liver Function Tests: Recent Labs  Lab 11/13/18 0147 11/14/18 0441  AST 32 13*  ALT 17 16  ALKPHOS 92 83  BILITOT 2.7* 0.6  PROT 6.9 6.5  ALBUMIN 3.6 3.2*   No results for input(s): LIPASE, AMYLASE in the last 168 hours. Recent Labs  Lab 11/15/18 0834  AMMONIA 44*    Cardiac Enzymes: Recent Labs  Lab 11/13/18 0147 11/13/18 0545 11/13/18 1153 11/15/18 1500  TROPONINI <0.03 <0.03 <0.03 <0.03   BNP (last 3 results) Recent Labs    12/13/17 1223 11/01/18 1828 11/13/18 0147  BNP 14.7 12.8 34.1    ProBNP (last 3 results) No results for input(s): PROBNP in the last 8760 hours.    Studies: No results found.  Scheduled Meds: . furosemide  40 mg Intravenous Q12H  . gabapentin  800 mg Oral TID  . ipratropium  0.5 mg Nebulization Q6H  . levalbuterol  0.63 mg Nebulization Q6H  . methylPREDNISolone (SOLU-MEDROL) injection  60 mg Intravenous Q6H  . OLANZapine  20 mg Oral QHS  . paliperidone  156 mg Intramuscular Q30 days  . [START ON 11/18/2018] pneumococcal 23 valent vaccine  0.5 mL Intramuscular Tomorrow-1000  . Rivaroxaban  15 mg Oral BID  . [START ON 12/05/2018] rivaroxaban  20 mg Oral Q supper  . sodium chloride flush  3 mL Intravenous Q12H    Admission status: Inpatient: Based on patients clinical presentation and evaluation of above clinical data, I have made determination that patient meets Inpatient criteria at this time.  Time spent: 25 min  Meredeth Ide   [facility] Pager [phone]. If 7PM-7AM, please contact night-coverage at [email or web address], Office  [phone]  password [email or web address]  11/17/2018, 3:39 PM  LOS: 4 days

## 2018-11-18 ENCOUNTER — Inpatient Hospital Stay (HOSPITAL_COMMUNITY): Payer: Medicare Other

## 2018-11-18 LAB — CBC
HCT: 46.6 % (ref 39.0–52.0)
Hemoglobin: 15.2 g/dL (ref 13.0–17.0)
MCH: 31.6 pg (ref 26.0–34.0)
MCHC: 32.6 g/dL (ref 30.0–36.0)
MCV: 96.9 fL (ref 80.0–100.0)
Platelets: 205 10*3/uL (ref 150–400)
RBC: 4.81 MIL/uL (ref 4.22–5.81)
RDW: 12.2 % (ref 11.5–15.5)
WBC: 10.3 10*3/uL (ref 4.0–10.5)
nRBC: 0 % (ref 0.0–0.2)

## 2018-11-18 LAB — BASIC METABOLIC PANEL
Anion gap: 11 (ref 5–15)
BUN: 16 mg/dL (ref 6–20)
CO2: 39 mmol/L — ABNORMAL HIGH (ref 22–32)
Calcium: 9.2 mg/dL (ref 8.9–10.3)
Chloride: 90 mmol/L — ABNORMAL LOW (ref 98–111)
Creatinine, Ser: 0.72 mg/dL (ref 0.61–1.24)
GFR calc Af Amer: >60 mL/min (ref >60)
GFR calc non Af Amer: >60 mL/min (ref >60)
GLUCOSE: 179 mg/dL — AB (ref 70–99)
Potassium: 4 mmol/L (ref 3.5–5.1)
Sodium: 140 mmol/L (ref 135–145)

## 2018-11-18 MED ORDER — IPRATROPIUM BROMIDE 0.02 % IN SOLN
0.5000 mg | Freq: Three times a day (TID) | RESPIRATORY_TRACT | Status: DC
  Administered 2018-11-18 – 2018-11-20 (×7): 0.5 mg via RESPIRATORY_TRACT
  Filled 2018-11-18 (×7): qty 2.5

## 2018-11-18 MED ORDER — LEVALBUTEROL HCL 0.63 MG/3ML IN NEBU
0.6300 mg | INHALATION_SOLUTION | Freq: Three times a day (TID) | RESPIRATORY_TRACT | Status: DC
  Administered 2018-11-18 – 2018-11-20 (×7): 0.63 mg via RESPIRATORY_TRACT
  Filled 2018-11-18 (×7): qty 3

## 2018-11-18 MED ORDER — SENNA 8.6 MG PO TABS
1.0000 | ORAL_TABLET | Freq: Two times a day (BID) | ORAL | Status: DC | PRN
  Administered 2018-11-18 – 2018-11-19 (×2): 8.6 mg via ORAL
  Filled 2018-11-18 (×2): qty 1

## 2018-11-18 NOTE — Progress Notes (Signed)
[facility]  PROGRESS NOTE  KARSON GORDAN RAQ:[id number] DOB: 1976/07/26 DOA: 11/13/2018 PCP: Patient, No Pcp Per   Brief HPI:   43 year old male with a history of paranoid schizophrenia, pulmonary embolism in March 2019(only completed 1 month of anticoagulation due to cost issues), hypertension, COPD, cocaine abuse who presented with 2-day history of sharp pleuritic right-sided chest pain with shortness of breath.  Patient had a similar episode 3 weeks ago at that time CTA showed resolved pulmonary emboli.  In the ED patient was found to be hypoxic, sinus tachycardia and tachypnea.  CT revealed pulmonary embolism with pulmonary infarct in the right middle and lower lobes.    Subjective   Patient seen and examined, continues to diurese with IV Lasix.  Still requiring oxygen 6 to 7 L/min high flow nasal cannula.   Assessment/Plan:     1. Acute hypoxic respiratory failure-multifactorial, patient has pulmonary embolism, chest x-ray shows pulmonary edema.  Also has component of obesity hypoventilation syndrome.  Continue oxygen via nasal cannula.  Improving on Lasix 40 mg IV every 12 hours.  Patient is still requiring oxygen despite diuresis with Lasix.  He was not on oxygen at home.  Likely will require oxygen at the time of discharge.  Called and discussed with PCCM Dr. Molli Knock, he recommends to continue with IV diuresis and follow-up pulmonology as outpatient for sleep study.  2. Recurrent multifocal pulmonary embolism-patient has PE with pulmonary infarcts in right middle lobe and right lower lobe, initially  in the March was without clear provoking factor.  Continue Xarelto.  3. ? Acute diastolic CHF-patient likely has acute diastolic CHF.  Started on  Lasix 40 mg IV every 12 hours.  Excellent diuresis with Lasix.  -7.8 L since admit.  Echocardiogram shows EF 60-65%, LVH.  Cardiology was consulted, no further recommendations.  4. Paranoid schizophrenia-continue Zyprexa, Invega 156 mg  intramuscular every 30 days.  5. ? COPD-patient started on Solu-Medrol 60 mg IV every 6 hours, ipratropium nebulizer every 6 hours, Xopenex nebulizer every 6 hours.    CBC: Recent Labs  Lab 11/13/18 0147 11/14/18 0441 11/15/18 0307 11/16/18 0340 11/17/18 0552 11/18/18 0335  WBC 10.4 8.1 8.7 9.3 11.1* 10.3  NEUTROABS 8.0*  --   --   --   --   --   HGB 16.0 14.7 14.2 15.5 15.1 15.2  HCT 48.6 43.8 43.0 46.7 45.9 46.6  MCV 95.1 96.1 97.3 95.9 96.4 96.9  PLT 145* 132* 142* 169 203 205    Basic Metabolic Panel: Recent Labs  Lab 11/14/18 0441 11/15/18 0834 11/15/18 1500 11/16/18 0340 11/17/18 0807 11/18/18 0335  NA 137 135  --  137 140 140  K 4.0 4.5  --  4.2 4.3 4.0  CL 100 94*  --  89* 89* 90*  CO2 28 32  --  33* 40* 39*  GLUCOSE 133* 176*  --  165* 173* 179*  BUN 8 5*  --  11 18 16   CREATININE 0.83 0.69  --  0.73 0.80 0.72  CALCIUM 8.7* 9.0  --  9.8 9.4 9.2  MG  --   --  1.9  --   --   --      DVT prophylaxis: IV heparin  Code Status: Full code  Family Communication: No family at bedside  Disposition Plan: likely home when medically ready for discharge     Consultants:  Cardiology  Procedures:     Antibiotics:   Anti-infectives (From admission, onward)   None  Objective   Vitals:   11/17/18 1955 11/17/18 2105 11/18/18 0352 11/18/18 0718  BP: 139/84  122/69   Pulse: (!) 111  98   Resp: 18  19   Temp: (!) 97.5 F (36.4 C)  97.6 F (36.4 C)   TempSrc: Oral  Oral   SpO2: 91% 96% 93% 96%  Weight:   (!) 147.5 kg   Height:        Intake/Output Summary (Last 24 hours) at 11/18/2018 1027 Last data filed at 11/18/2018 6256 Gross per 24 hour  Intake 1506 ml  Output 3647 ml  Net -2141 ml   Filed Weights   11/16/18 0645 11/17/18 0637 11/18/18 0352  Weight: (!) 143.2 kg (!) 144.6 kg (!) 147.5 kg     Physical Examination:   General: Appears in no acute distress  Cardiovascular: S1-S2, regular  Respiratory: Decreased breath  sounds at lung bases  Abdomen: Abdomen is soft nontender, no organomegaly  Extremities: No edema in the lower extremities  Neurologic: Alert, oriented x3, no focal deficit noted      Data Reviewed: I have personally reviewed following labs and imaging studies   No results found for this or any previous visit (from the past 240 hour(s)).   Liver Function Tests: Recent Labs  Lab 11/13/18 0147 11/14/18 0441  AST 32 13*  ALT 17 16  ALKPHOS 92 83  BILITOT 2.7* 0.6  PROT 6.9 6.5  ALBUMIN 3.6 3.2*   No results for input(s): LIPASE, AMYLASE in the last 168 hours. Recent Labs  Lab 11/15/18 0834  AMMONIA 44*    Cardiac Enzymes: Recent Labs  Lab 11/13/18 0147 11/13/18 0545 11/13/18 1153 11/15/18 1500  TROPONINI <0.03 <0.03 <0.03 <0.03   BNP (last 3 results) Recent Labs    12/13/17 1223 11/01/18 1828 11/13/18 0147  BNP 14.7 12.8 34.1    ProBNP (last 3 results) No results for input(s): PROBNP in the last 8760 hours.    Studies: No results found.  Scheduled Meds: . furosemide  40 mg Intravenous Q12H  . gabapentin  800 mg Oral TID  . ipratropium  0.5 mg Nebulization TID  . levalbuterol  0.63 mg Nebulization TID  . methylPREDNISolone (SOLU-MEDROL) injection  60 mg Intravenous Q6H  . OLANZapine  20 mg Oral QHS  . paliperidone  156 mg Intramuscular Q30 days  . pneumococcal 23 valent vaccine  0.5 mL Intramuscular Tomorrow-1000  . Rivaroxaban  15 mg Oral BID  . [START ON 12/05/2018] rivaroxaban  20 mg Oral Q supper  . sodium chloride flush  3 mL Intravenous Q12H    Admission status: Inpatient: Based on patients clinical presentation and evaluation of above clinical data, I have made determination that patient meets Inpatient criteria at this time.  Time spent: 25 min  Meredeth Ide   [facility] Pager [phone]. If 7PM-7AM, please contact night-coverage at [email or web address], Office  [phone]  password [facility]  11/18/2018, 10:27 AM  LOS: 5 days

## 2018-11-18 NOTE — Plan of Care (Signed)
  Problem: Health Behavior/Discharge Planning: Goal: Ability to manage health-related needs will improve Outcome: Progressing   

## 2018-11-19 DIAGNOSIS — J810 Acute pulmonary edema: Secondary | ICD-10-CM

## 2018-11-19 LAB — CBC
HCT: 45.1 % (ref 39.0–52.0)
HEMOGLOBIN: 14.8 g/dL (ref 13.0–17.0)
MCH: 31.6 pg (ref 26.0–34.0)
MCHC: 32.8 g/dL (ref 30.0–36.0)
MCV: 96.2 fL (ref 80.0–100.0)
Platelets: 209 10*3/uL (ref 150–400)
RBC: 4.69 MIL/uL (ref 4.22–5.81)
RDW: 12.1 % (ref 11.5–15.5)
WBC: 8.4 10*3/uL (ref 4.0–10.5)
nRBC: 0 % (ref 0.0–0.2)

## 2018-11-19 LAB — BASIC METABOLIC PANEL
Anion gap: 11 (ref 5–15)
BUN: 19 mg/dL (ref 6–20)
CO2: 40 mmol/L — ABNORMAL HIGH (ref 22–32)
Calcium: 9 mg/dL (ref 8.9–10.3)
Chloride: 88 mmol/L — ABNORMAL LOW (ref 98–111)
Creatinine, Ser: 0.68 mg/dL (ref 0.61–1.24)
GFR calc Af Amer: >60 mL/min (ref >60)
GFR calc non Af Amer: >60 mL/min (ref >60)
Glucose, Bld: 155 mg/dL — ABNORMAL HIGH (ref 70–99)
Potassium: 4.2 mmol/L (ref 3.5–5.1)
Sodium: 139 mmol/L (ref 135–145)

## 2018-11-19 MED ORDER — POLYETHYLENE GLYCOL 3350 17 G PO PACK
17.0000 g | PACK | Freq: Two times a day (BID) | ORAL | Status: DC
  Administered 2018-11-19 – 2018-11-20 (×3): 17 g via ORAL
  Filled 2018-11-19 (×3): qty 1

## 2018-11-19 NOTE — Progress Notes (Signed)
Pt up in chair sleeping with Oxygen at 5L via [place]. Sats remain in low to mid 90's. Pt awakened for morning med pass and became belligerent. C/O that "you don't know what you are doing." States that, "You don't need to be telling me what to do." POC explained. Pt reminded that he is being treated for blood clots in both lungs and leg. Explained to pt that the fluid in his lungs is improving and that we are giving him Lasix and weaning his Oxygen as he can tolerate. Pt remains upset that he is not getting enough fluids to drink and enough food. Pt is asking for another nurse. Emelda Brothers, RN made aware and will speak to pt regarding his concerns.

## 2018-11-19 NOTE — Plan of Care (Signed)
  Problem: Health Behavior/Discharge Planning: Goal: Ability to manage health-related needs will improve Outcome: Not Progressing   Problem: Clinical Measurements: Goal: Ability to maintain clinical measurements within normal limits will improve Outcome: Progressing Goal: Will remain free from infection Outcome: Progressing Goal: Respiratory complications will improve Outcome: Progressing   Problem: Activity: Goal: Risk for activity intolerance will decrease Outcome: Progressing

## 2018-11-19 NOTE — Progress Notes (Signed)
[facility]  PROGRESS NOTE  [name] OEU:[id number] DOB: 10/15/1975 DOA: 11/13/2018 PCP: Patient, No Pcp Per   Brief HPI:   43 year old male with a history of paranoid schizophrenia, pulmonary embolism in March 2019(only completed 1 month of anticoagulation due to cost issues), hypertension, COPD, cocaine abuse who presented with 2-day history of sharp pleuritic right-sided chest pain with shortness of breath.  Patient had a similar episode 3 weeks ago at that time CTA showed resolved pulmonary emboli.  In the ED patient was found to be hypoxic, sinus tachycardia and tachypnea.  CT revealed pulmonary embolism with pulmonary infarct in the right middle and lower lobes.    Subjective   Patient seen and examined, breathing is improved.  Still on IV Lasix.   Assessment/Plan:     1. Acute hypoxic respiratory failure-slowly improving, multifactorial, patient has pulmonary embolism, chest x-ray shows pulmonary edema.  Also has component of obesity hypoventilation syndrome.  Continue oxygen via nasal cannula.  Improving on Lasix 40 mg IV every 12 hours.  Patient is still requiring oxygen despite diuresis with Lasix.  He was not on oxygen at home.  Likely will require oxygen at the time of discharge.  Called and discussed with PCCM Dr. Molli Knock, he recommends to continue with IV diuresis and follow-up pulmonology as outpatient for sleep study.  2. Recurrent multifocal pulmonary embolism-patient has PE with pulmonary infarcts in right middle lobe and right lower lobe, initially  in the March was without clear provoking factor.  Continue Xarelto.  3. Pulmonary edema-echocardiogram does not show CHF, started on  Lasix 40 mg IV every 12 hours.  Excellent diuresis with Lasix.  -8.6 L since admit.  Echocardiogram shows EF 60-65%, LVH.  Cardiology was consulted, no further recommendations.  4. Paranoid schizophrenia-continue Zyprexa, Invega 156 mg intramuscular every 30 days.  5. ? COPD-patient  started on Solu-Medrol 60 mg IV every 6 hours, ipratropium nebulizer every 6 hours, Xopenex nebulizer every 6 hours.    CBC: Recent Labs  Lab 11/13/18 0147  11/15/18 0307 11/16/18 0340 11/17/18 0552 11/18/18 0335 11/19/18 0340  WBC 10.4   < > 8.7 9.3 11.1* 10.3 8.4  NEUTROABS 8.0*  --   --   --   --   --   --   HGB 16.0   < > 14.2 15.5 15.1 15.2 14.8  HCT 48.6   < > 43.0 46.7 45.9 46.6 45.1  MCV 95.1   < > 97.3 95.9 96.4 96.9 96.2  PLT 145*   < > 142* 169 203 205 209   < > = values in this interval not displayed.    Basic Metabolic Panel: Recent Labs  Lab 11/15/18 0834 11/15/18 1500 11/16/18 0340 11/17/18 0807 11/18/18 0335 11/19/18 0340  NA 135  --  137 140 140 139  K 4.5  --  4.2 4.3 4.0 4.2  CL 94*  --  89* 89* 90* 88*  CO2 32  --  33* 40* 39* 40*  GLUCOSE 176*  --  165* 173* 179* 155*  BUN 5*  --  11 18 16 19   CREATININE 0.69  --  0.73 0.80 0.72 0.68  CALCIUM 9.0  --  9.8 9.4 9.2 9.0  MG  --  1.9  --   --   --   --      DVT prophylaxis: IV heparin  Code Status: Full code  Family Communication: No family at bedside  Disposition Plan: likely home when medically ready for discharge  Consultants:  Cardiology  Procedures:     Antibiotics:   Anti-infectives (From admission, onward)   None       Objective   Vitals:   11/18/18 2025 11/19/18 0142 11/19/18 0614 11/19/18 0718  BP: 117/65  120/77   Pulse: (!) 108  94   Resp:      Temp: 98.1 F (36.7 C)  98 F (36.7 C)   TempSrc: Oral  Oral   SpO2: 94% 95% 95% 92%  Weight:   (!) 143.7 kg   Height:        Intake/Output Summary (Last 24 hours) at 11/19/2018 1011 Last data filed at 11/19/2018 0800 Gross per 24 hour  Intake 1400 ml  Output 3350 ml  Net -1950 ml   Filed Weights   11/17/18 0637 11/18/18 0352 11/19/18 0614  Weight: (!) 144.6 kg (!) 147.5 kg (!) 143.7 kg     Physical Examination:   General: Appears in no acute distress  Cardiovascular: S1-S2,  regular  Respiratory: Clear to auscultation bilaterally  Abdomen: Abdomen is soft, nontender, no organomegaly  Extremities: No edema of the lower extremities  Neurologic: Alert, oriented x3, no focal deficit noted.      Data Reviewed: I have personally reviewed following labs and imaging studies   No results found for this or any previous visit (from the past 240 hour(s)).   Liver Function Tests: Recent Labs  Lab 11/13/18 0147 11/14/18 0441  AST 32 13*  ALT 17 16  ALKPHOS 92 83  BILITOT 2.7* 0.6  PROT 6.9 6.5  ALBUMIN 3.6 3.2*   No results for input(s): LIPASE, AMYLASE in the last 168 hours. Recent Labs  Lab 11/15/18 0834  AMMONIA 44*    Cardiac Enzymes: Recent Labs  Lab 11/13/18 0147 11/13/18 0545 11/13/18 1153 11/15/18 1500  TROPONINI <0.03 <0.03 <0.03 <0.03   BNP (last 3 results) Recent Labs    12/13/17 1223 11/01/18 1828 11/13/18 0147  BNP 14.7 12.8 34.1    ProBNP (last 3 results) No results for input(s): PROBNP in the last 8760 hours.    Studies: Dg Chest 2 View  Result Date: 11/18/2018 CLINICAL DATA:  Congestive heart failure. EXAM: CHEST - 2 VIEW COMPARISON:  Chest x-ray dated November 15, 2018. FINDINGS: Stable cardiomegaly. Improved pulmonary edema. No large pleural effusion or pneumothorax. Low lung volumes. No acute osseous abnormality. IMPRESSION: 1. Improved pulmonary edema. Electronically Signed   By: Obie Dredge M.D.   On: 11/18/2018 13:16    Scheduled Meds: . furosemide  40 mg Intravenous Q12H  . gabapentin  800 mg Oral TID  . ipratropium  0.5 mg Nebulization TID  . levalbuterol  0.63 mg Nebulization TID  . methylPREDNISolone (SOLU-MEDROL) injection  60 mg Intravenous Q6H  . OLANZapine  20 mg Oral QHS  . paliperidone  156 mg Intramuscular Q30 days  . pneumococcal 23 valent vaccine  0.5 mL Intramuscular Tomorrow-1000  . polyethylene glycol  17 g Oral BID  . Rivaroxaban  15 mg Oral BID  . [START ON 12/05/2018] rivaroxaban   20 mg Oral Q supper  . sodium chloride flush  3 mL Intravenous Q12H    Admission status: Inpatient: Based on patients clinical presentation and evaluation of above clinical data, I have made determination that patient meets Inpatient criteria at this time.  Time spent: 25 min  Meredeth Ide   [facility] Pager [phone]. If 7PM-7AM, please contact night-coverage at [email or web address], Office  [phone]  password [facility]  11/19/2018, 10:11 AM  LOS: 6 days

## 2018-11-20 LAB — CBC
HCT: 46.2 % (ref 39.0–52.0)
Hemoglobin: 15.1 g/dL (ref 13.0–17.0)
MCH: 31.3 pg (ref 26.0–34.0)
MCHC: 32.7 g/dL (ref 30.0–36.0)
MCV: 95.7 fL (ref 80.0–100.0)
Platelets: 224 10*3/uL (ref 150–400)
RBC: 4.83 MIL/uL (ref 4.22–5.81)
RDW: 12 % (ref 11.5–15.5)
WBC: 7.9 10*3/uL (ref 4.0–10.5)
nRBC: 0 % (ref 0.0–0.2)

## 2018-11-20 LAB — BASIC METABOLIC PANEL
Anion gap: 9 (ref 5–15)
BUN: 20 mg/dL (ref 6–20)
CHLORIDE: 90 mmol/L — AB (ref 98–111)
CO2: 39 mmol/L — ABNORMAL HIGH (ref 22–32)
CREATININE: 0.74 mg/dL (ref 0.61–1.24)
Calcium: 8.8 mg/dL — ABNORMAL LOW (ref 8.9–10.3)
GFR calc Af Amer: >60 mL/min (ref >60)
GFR calc non Af Amer: >60 mL/min (ref >60)
Glucose, Bld: 190 mg/dL — ABNORMAL HIGH (ref 70–99)
Potassium: 4.3 mmol/L (ref 3.5–5.1)
Sodium: 138 mmol/L (ref 135–145)

## 2018-11-20 MED ORDER — RIVAROXABAN 15 MG PO TABS: 15.0000 mg | ORAL_TABLET | Freq: Two times a day (BID) | ORAL | 0 refills | Status: DC

## 2018-11-20 MED ORDER — OLANZAPINE 20 MG PO TABS: 20.0000 mg | ORAL_TABLET | Freq: Every day | ORAL | 2 refills | Status: DC

## 2018-11-20 MED ORDER — RIVAROXABAN 20 MG PO TABS: 20.0000 mg | ORAL_TABLET | Freq: Every day | ORAL | 2 refills | Status: DC

## 2018-11-20 MED ORDER — GABAPENTIN 800 MG PO TABS: 800.0000 mg | ORAL_TABLET | Freq: Three times a day (TID) | ORAL | 2 refills | Status: DC

## 2018-11-20 MED ORDER — SENNA 8.6 MG PO TABS: 1.0000 | ORAL_TABLET | Freq: Two times a day (BID) | ORAL | 0 refills | Status: AC | PRN

## 2018-11-20 MED ORDER — ALBUTEROL SULFATE HFA 108 (90 BASE) MCG/ACT IN AERS: 2.0000 | INHALATION_SPRAY | Freq: Four times a day (QID) | RESPIRATORY_TRACT | 2 refills | Status: AC | PRN

## 2018-11-20 MED ORDER — FUROSEMIDE 40 MG PO TABS: 40.0000 mg | ORAL_TABLET | Freq: Every day | ORAL | 11 refills | Status: DC

## 2018-11-20 MED ORDER — PREDNISONE 10 MG PO TABS: ORAL_TABLET | ORAL | 0 refills | Status: DC

## 2018-11-20 MED FILL — XARELTO 15 MG TABLET: 15 | 14 days supply | Qty: 28 | Fill #0

## 2018-11-20 MED FILL — predniSONE 10 MG TABS: 10 | 4 days supply | Qty: 10 | Fill #0

## 2018-11-20 MED FILL — OLANZapine 10 MG TABS: 10 | 30 days supply | Qty: 60 | Fill #0 | Status: TO

## 2018-11-20 MED FILL — FUROSEMIDE 40 MG TABLET: 40 | 30 days supply | Qty: 30 | Fill #0 | Status: TO

## 2018-11-20 MED FILL — GABAPENTIN 400 MG CAPSULE: 400 | 30 days supply | Qty: 180 | Fill #0 | Status: TO

## 2018-11-20 MED FILL — XARELTO 20 MG TABLET: 20 | 30 days supply | Qty: 30 | Fill #0 | Status: TO

## 2018-11-20 MED FILL — PROAIR HFA 90 MCG INHALER: 108 (90 BAS | 25 days supply | Qty: 9 | Fill #0 | Status: TO

## 2018-11-20 MED FILL — SENNA 8.6 MG TABS: 8.6 | 10 days supply | Qty: 20 | Fill #0

## 2018-11-20 NOTE — Progress Notes (Signed)
SATURATION QUALIFICATIONS:  Patient Saturations on Room Air at Rest = 85%  Patient Saturations on [employer] while Ambulating = 83%  Patient Saturations on 5 Liters of oxygen while Ambulating = 95%  Please briefly explain why patient needs home oxygen: Oxygen dropped below 90% on room air.

## 2018-11-20 NOTE — Discharge Summary (Signed)
Physician Discharge Summary  COLIE FUGITT UJW:[id number] DOB: Feb 06, 1976 DOA: 11/13/2018  PCP: Patient, No Pcp Per  Admit date: 11/13/2018 Discharge date: 11/20/2018  Time spent: 40 minutes  Recommendations for Outpatient Follow-up:  1. Patient will be discharged on 5 L/min of oxygen via nasal cannula 2. Follow-up pulmonary as outpatient on 12/05/2018 3. Continue Xarelto   Discharge Diagnoses:  Principal Problem:   recurrent Pulmonary embolism, bilateral (HCC)  3/19 and 2/20 Active Problems:   Homeless   Cocaine abuse with cocaine-induced mood disorder (HCC)   Paranoid schizophrenia (HCC)   Acute respiratory failure with hypoxia (HCC)   Hypertension   Chronic back pain   Pulmonary emboli [facility])   Discharge Condition: Stable  Diet recommendation: Heart healthy diet  Filed Weights   11/18/18 0352 11/19/18 0614 11/20/18 0353  Weight: (!) 147.5 kg (!) 143.7 kg (!) 144.1 kg    History of present illness:  43 year old male with a history of paranoid schizophrenia, pulmonary embolism in March 2019(only completed 1 month of anticoagulation due to cost issues), hypertension, COPD, cocaine abuse who presented with 2-day history of sharp pleuritic right-sided chest pain with shortness of breath.  Patient had a similar episode 3 weeks ago at that time CTA showed resolved pulmonary emboli.  In the ED patient was found to be hypoxic, sinus tachycardia and tachypnea.  CT revealed pulmonary embolism with pulmonary infarct in the right middle and lower lobes.   Hospital Course:   1. Acute hypoxic respiratory failure-slowly improving, multifactorial, patient has pulmonary embolism, chest x-ray shows pulmonary edema.  Also has component of obesity hypoventilation syndrome.  Patient was started on Lasix 40 mg IV every 12 hours,  net -10.7 L.  Patient wants to go home, will discharge him home on 5 L/min of oxygen via nasal cannula.    Called and discussed with PCCM Dr. Molli Knock, he recommends to  continue with  diuresis and follow-up pulmonology as outpatient for sleep study.  Patient has an appointment to see pulmonology on 12/05/2018  2. Recurrent multifocal pulmonary embolism-patient has PE with pulmonary infarcts in right middle lobe and right lower lobe, initially  in the March was without clear provoking factor.  Continue Xarelto.  Patient to be discharged on Xarelto 15 mg twice a day till March 9 and then 20 mg p.o. daily.  3. Pulmonary edema-echocardiogram does not show CHF, started on  Lasix 40 mg IV every 12 hours.  Excellent diuresis with Lasix.  -10.7 L since admit.  Echocardiogram shows EF 60-65%, LVH.  Cardiology was consulted, no further recommendations.  Will discharge on Lasix 40 mg p.o. daily.  4. Paranoid schizophrenia-continue Zyprexa, Invega 156 mg intramuscular every 30 days.  5. ? COPD-patient started on Solu-Medrol 60 mg IV every 6 hours, ipratropium nebulizer every 6 hours, Xopenex nebulizer every 6 hours.  Will DC IV Solu-Medrol and discharged on prednisone taper, PRN albuterol.     Procedures:    Consultations:  Cardiology  Discharge Exam: Vitals:   11/20/18 0353 11/20/18 0832  BP: 121/74   Pulse: 78 82  Resp: 20 20  Temp: 98.2 F (36.8 C)   SpO2: 91% 94%    General: Appears in no acute distress Cardiovascular: S1-S2, regular, no murmur auscultated Respiratory: Clear to auscultation bilaterally  Discharge Instructions   Discharge Instructions    Diet - low sodium heart healthy   Complete by:  As directed    Increase activity slowly   Complete by:  As directed    Polysomnography 4  or more parameters   Complete by:   Dec 04, 2018 (Approximate)    Where should this test be performed:  [facility] - Pulmonary     Allergies as of 11/20/2018      Reactions   Haldol [haloperidol Decanoate] Other (See Comments)   'I curl up"      Medication List    TAKE these medications   albuterol 108 (90 Base) MCG/ACT inhaler Commonly known as:   PROVENTIL HFA;VENTOLIN HFA Inhale 2 puffs into the lungs every 6 (six) hours as needed for wheezing or shortness of breath. What changed:  how much to take   furosemide 40 MG tablet Commonly known as:  LASIX Take 1 tablet (40 mg total) by mouth daily.   gabapentin 800 MG tablet Commonly known as:  NEURONTIN Take 1 tablet (800 mg total) by mouth 3 (three) times daily.   INVEGA SUSTENNA 156 MG/ML Susy injection Generic drug:  paliperidone Inject 156 mg into the muscle every 30 (thirty) days. On the 4th of each month   OLANZapine 20 MG tablet Commonly known as:  ZYPREXA Take 1 tablet (20 mg total) by mouth at bedtime.   predniSONE 10 MG tablet Commonly known as:  DELTASONE Prednisone 40 mg po daily x 1 day then Prednisone 30 mg po daily x 1 day then Prednisone 20 mg po daily x 1 day then Prednisone 10 mg daily x 1 day then stop...   Rivaroxaban 15 MG Tabs tablet Commonly known as:  XARELTO Take 1 tablet (15 mg total) by mouth 2 (two) times daily with a meal for 14 days.   rivaroxaban 20 MG Tabs tablet Commonly known as:  XARELTO Take 1 tablet (20 mg total) by mouth daily with supper. Start taking on:  December 05, 2018   senna 8.6 MG Tabs tablet Commonly known as:  SENOKOT Take 1 tablet (8.6 mg total) by mouth 2 (two) times daily as needed for up to 10 days for mild constipation.            Durable Medical Equipment  (From admission, onward)         Start     Ordered   11/20/18 1106  For home use only DME oxygen  Once    Question Answer Comment  Mode or (Route) Nasal cannula   Liters per Minute 5   Frequency Continuous (stationary and portable oxygen unit needed)   Oxygen conserving device Yes   Oxygen delivery system Gas      11/20/18 1105         Allergies  Allergen Reactions  . Haldol [Haloperidol Decanoate] Other (See Comments)    'I curl up"   Follow-up Information    [facility] Follow up.   Specialty:  Home Health  Services Why:  Registered Nurse, Social Worker, Physician that makes home visits.  Contact information: [address] [place] [address] [phone]        Tomma Lightning, MD. Nyra Capes on 12/05/2018.   Specialty:  Pulmonary Disease Why:  Appointment at 1:30 pm. Arrive 15 min prior. New patient visit for pulmonary embolism and sleep apnea. Contact information: [address] [place] [address] [phone]            The results of significant diagnostics from this hospitalization (including imaging, microbiology, ancillary and laboratory) are listed below for reference.    Significant Diagnostic Studies: Dg Chest 2 View  Result Date: 11/18/2018  CLINICAL DATA:  Congestive heart failure. EXAM: CHEST - 2 VIEW COMPARISON:  Chest x-ray dated November 15, 2018. FINDINGS: Stable cardiomegaly. Improved pulmonary edema. No large pleural effusion or pneumothorax. Low lung volumes. No acute osseous abnormality. IMPRESSION: 1. Improved pulmonary edema. Electronically Signed   By: Obie Dredge M.D.   On: 11/18/2018 13:16   Dg Chest 2 View  Result Date: 11/15/2018 CLINICAL DATA:  Hypoxia, smoker, COPD EXAM: CHEST - 2 VIEW COMPARISON:  11/13/2018 FINDINGS: Enlargement of cardiac silhouette with pulmonary vascular congestion. Mediastinal contour stable. BILATERAL airspace infiltrates again seen favor pulmonary edema, probably not significantly changed when accounting for differences in technique. No pneumothorax. Question minimal RIGHT pleural effusion. IMPRESSION: Enlargement of cardiac silhouette with pulmonary vascular congestion and persistent pulmonary infiltrates favoring pulmonary edema. Electronically Signed   By: Ulyses Southward M.D.   On: 11/15/2018 11:23   Dg Chest 2 View  Result Date: 11/13/2018 CLINICAL DATA:  Initial evaluation for acute chest pain. EXAM: CHEST - 2 VIEW COMPARISON:  Prior radiograph from 11/01/2018 FINDINGS: Cardiomegaly, stable.  Mediastinal  silhouette normal. Lungs hypoinflated. Diffuse vascular congestion with interstitial prominence, suggesting mild diffuse pulmonary interstitial edema. No definite pleural effusion. No consolidative opacity. No pneumothorax. No acute osseous finding. IMPRESSION: Cardiomegaly with mild diffuse pulmonary interstitial edema, suggesting CHF. Electronically Signed   By: Rise Mu M.D.   On: 11/13/2018 02:38   Dg Chest 2 View  Result Date: 11/01/2018 CLINICAL DATA:  Amnesia for 2 days since doing crack. Confusion. EXAM: CHEST - 2 VIEW COMPARISON:  10/28/2018 FINDINGS: Cardiac enlargement. No vascular congestion. No airspace disease or consolidation in the lungs. No blunting of costophrenic angles. No pneumothorax. Mediastinal contours appear intact. IMPRESSION: Cardiac enlargement. No evidence of active pulmonary disease. Electronically Signed   By: Burman Nieves M.D.   On: 11/01/2018 19:12   Dg Chest 2 View  Result Date: 10/28/2018 CLINICAL DATA:  Acute chest pain and shortness of breath. EXAM: CHEST - 2 VIEW COMPARISON:  10/22/2018 and prior chest radiographs FINDINGS: Cardiomegaly and pulmonary vascular congestion noted. Mild interstitial opacities likely represents pulmonary edema. No pleural effusion or pneumothorax. No acute bony abnormalities are identified. IMPRESSION: Cardiomegaly with pulmonary vascular congestion and question mild interstitial edema. Electronically Signed   By: Harmon Pier M.D.   On: 10/28/2018 22:01   Ct Angio Chest Pe W And/or Wo Contrast  Result Date: 11/13/2018 CLINICAL DATA:  Shortness of breath and chest pain.  Cocaine use EXAM: CT ANGIOGRAPHY CHEST WITH CONTRAST TECHNIQUE: Multidetector CT imaging of the chest was performed using the standard protocol during bolus administration of intravenous contrast. Multiplanar CT image reconstructions and MIPs were obtained to evaluate the vascular anatomy. CONTRAST:  ISOVUE-370 IOPAMIDOL (ISOVUE-370) INJECTION 76%  COMPARISON:  10/22/2018 FINDINGS: Cardiovascular: Positive for acute pulmonary emboli. Occlusive filling defect seen in the posterior basal segment right lower lobe. Branching central defect seen at the interlobar pulmonary artery extending into right lower basal and medial segment right middle lobe segments. Left upper lobe clot is seen anteriorly and at the lingula. RV to LV ratio is 0.86. Heart morphology is stable from prior. No pericardial effusion. Negative aorta. Mediastinum/Nodes: Negative for adenopathy Lungs/Pleura: Wedge of ground-glass density in the subpleural right middle lobe and right lower lobe consistent with infarcts. There is also atelectasis. No pulmonary edema. Upper Abdomen: Negative Musculoskeletal: No acute finding Critical Value/emergent results were called by telephone at the time of interpretation on 11/13/2018 at 5:11 am to Dr. Derwood Kaplan , who verbally  acknowledged these results. Review of the MIP images confirms the above findings. IMPRESSION: Multifocal acute pulmonary embolism with infarcts in the right middle and right lower lobes. Electronically Signed   By: Marnee Spring M.D.   On: 11/13/2018 05:17   Ct Angio Chest Pe W And/or Wo Contrast  Result Date: 10/22/2018 CLINICAL DATA:  PE suspected, high pretest prob Chest pain and shortness of breath. History of pulmonary embolus, noncompliant with anticoagulation. EXAM: CT ANGIOGRAPHY CHEST WITH CONTRAST TECHNIQUE: Multidetector CT imaging of the chest was performed using the standard protocol during bolus administration of intravenous contrast. Multiplanar CT image reconstructions and MIPs were obtained to evaluate the vascular anatomy. CONTRAST:  ISOVUE-370 IOPAMIDOL (ISOVUE-370) INJECTION 76% COMPARISON:  Radiographs 04/10/2018. CT 12/13/2017 FINDINGS: Cardiovascular: Prior filling defects within the lower lobe segmental branches on prior exam have resolved. No new filling defects in the pulmonary arteries to suggest  pulmonary embolus. Soft tissue attenuation from body habitus limits detailed assessment. The heart is normal in size. No aortic dissection. No pericardial effusion. Mediastinum/Nodes: No enlarged mediastinal or hilar lymph nodes. Diffuse mediastinal lipomatosis likely secondary to habitus. The esophagus is decompressed. No visualized thyroid nodule. Lungs/Pleura: Breathing motion artifact. Central bronchitic change. No confluent airspace disease. Slight heterogeneous parenchyma may be due to small airways disease or expiratory phase imaging. No pulmonary edema or pleural effusion. No pulmonary mass. Upper Abdomen: Enlarged liver is partially included. Suspect gallstone. No acute findings. Musculoskeletal: There are no acute or suspicious osseous abnormalities. Review of the MIP images confirms the above findings. IMPRESSION: 1. No pulmonary embolus. Prior lower lobe pulmonary emboli have resolved. 2. Mild central bronchitic change. Electronically Signed   By: Narda Rutherford M.D.   On: 10/22/2018 22:16   Vas [place] Lower Extremity Venous (dvt)  Result Date: 11/14/2018  Lower Venous Study Indications: Pain, and pulmonary embolism.  Performing Technologist: Gertie Fey MHA, RDMS, RVT, RDCS  Examination Guidelines: A complete evaluation includes B-mode imaging, spectral Doppler, color Doppler, and power Doppler as needed of all accessible portions of each vessel. Bilateral testing is considered an integral part of a complete examination. Limited examinations for reoccurring indications may be performed as noted.  Right Venous Findings: +---------+---------------+---------+-----------+----------+-------+          CompressibilityPhasicitySpontaneityPropertiesSummary +---------+---------------+---------+-----------+----------+-------+ CFV      Full           Yes      Yes                          +---------+---------------+---------+-----------+----------+-------+ SFJ      Full                                                  +---------+---------------+---------+-----------+----------+-------+ FV Prox  Full                                                 +---------+---------------+---------+-----------+----------+-------+ FV Mid   Full                                                 +---------+---------------+---------+-----------+----------+-------+  FV DistalFull                                                 +---------+---------------+---------+-----------+----------+-------+ PFV      Full                                                 +---------+---------------+---------+-----------+----------+-------+ POP      Full           Yes      Yes                          +---------+---------------+---------+-----------+----------+-------+ PTV      Full                                                 +---------+---------------+---------+-----------+----------+-------+ PERO     Full                                                 +---------+---------------+---------+-----------+----------+-------+  Left Venous Findings: +---------+---------------+---------+-----------+----------+-------+          CompressibilityPhasicitySpontaneityPropertiesSummary +---------+---------------+---------+-----------+----------+-------+ CFV      Full           Yes      Yes                          +---------+---------------+---------+-----------+----------+-------+ SFJ      Full                                                 +---------+---------------+---------+-----------+----------+-------+ FV Prox  Full                                                 +---------+---------------+---------+-----------+----------+-------+ FV Mid   Full                                                 +---------+---------------+---------+-----------+----------+-------+ FV DistalFull                                                  +---------+---------------+---------+-----------+----------+-------+ PFV      Full                                                 +---------+---------------+---------+-----------+----------+-------+  POP      Full           Yes      Yes                          +---------+---------------+---------+-----------+----------+-------+ PTV      Full                                                 +---------+---------------+---------+-----------+----------+-------+ PERO     None                    No                   Acute   +---------+---------------+---------+-----------+----------+-------+    Summary: Right: There is no evidence of deep vein thrombosis in the lower extremity. No cystic structure found in the popliteal fossa. Left: Findings consistent with acute deep vein thrombosis involving the left peroneal vein. No cystic structure found in the popliteal fossa.  *See table(s) above for measurements and observations. Electronically signed by Waverly Ferrari MD on 11/14/2018 at 2:46:45 PM.    Final     Microbiology: No results found for this or any previous visit (from the past 240 hour(s)).   Labs: Basic Metabolic Panel: Recent Labs  Lab 11/15/18 1500 11/16/18 0340 11/17/18 0807 11/18/18 0335 11/19/18 0340 11/20/18 0349  NA  --  137 140 140 139 138  K  --  4.2 4.3 4.0 4.2 4.3  CL  --  89* 89* 90* 88* 90*  CO2  --  33* 40* 39* 40* 39*  GLUCOSE  --  165* 173* 179* 155* 190*  BUN  --  CREATININE  --  0.73 0.80 0.72 0.68 0.74  CALCIUM  --  9.8 9.4 9.2 9.0 8.8*  MG 1.9  --   --   --   --   --    Liver Function Tests: Recent Labs  Lab 11/14/18 0441  AST 13*  ALT 16  ALKPHOS 83  BILITOT 0.6  PROT 6.5  ALBUMIN 3.2*   No results for input(s): LIPASE, AMYLASE in the last 168 hours. Recent Labs  Lab 11/15/18 0834  AMMONIA 44*   CBC: Recent Labs  Lab 11/16/18 0340 11/17/18 0552 11/18/18 0335 11/19/18 0340 11/20/18 0349  WBC 9.3 11.1*  10.3 8.4 7.9  HGB 15.5 15.1 15.2 14.8 15.1  HCT 46.7 45.9 46.6 45.1 46.2  MCV 95.9 96.4 96.9 96.2 95.7  PLT 169 203 205 209 224   Cardiac Enzymes: Recent Labs  Lab 11/13/18 1153 11/15/18 1500  TROPONINI <0.03 <0.03   BNP: BNP (last 3 results) Recent Labs    12/13/17 1223 11/01/18 1828 11/13/18 0147  BNP 14.7 12.8 34.1    ProBNP (last 3 results) No results for input(s): PROBNP in the last 8760 hours.  CBG: Recent Labs  Lab 11/15/18 1442  GLUCAP 172*       Signed:  Meredeth Ide MD.  [facility] 11/20/2018, 11:43 AM

## 2018-11-25 DIAGNOSIS — J9611 Chronic respiratory failure with hypoxia: Secondary | ICD-10-CM | POA: Diagnosis not present

## 2018-11-25 DIAGNOSIS — I2699 Other pulmonary embolism without acute cor pulmonale: Secondary | ICD-10-CM | POA: Diagnosis not present

## 2018-11-25 DIAGNOSIS — G609 Hereditary and idiopathic neuropathy, unspecified: Secondary | ICD-10-CM | POA: Diagnosis not present

## 2018-11-25 DIAGNOSIS — I10 Essential (primary) hypertension: Secondary | ICD-10-CM | POA: Diagnosis not present

## 2018-11-25 DIAGNOSIS — I4891 Unspecified atrial fibrillation: Secondary | ICD-10-CM | POA: Diagnosis not present

## 2018-11-27 ENCOUNTER — Encounter (HOSPITAL_COMMUNITY): Payer: Self-pay

## 2018-11-27 ENCOUNTER — Emergency Department (HOSPITAL_COMMUNITY): Payer: Medicare Other

## 2018-11-27 ENCOUNTER — Other Ambulatory Visit: Payer: Self-pay

## 2018-11-27 ENCOUNTER — Inpatient Hospital Stay (HOSPITAL_COMMUNITY)
Admission: EM | Admit: 2018-11-27 | Discharge: 2018-11-29 | DRG: 190 | Disposition: A | Discharge status: Home / Self Care | Payer: Medicare Other | Attending: Internal Medicine | Admitting: Internal Medicine

## 2018-11-27 DIAGNOSIS — Z7901 Long term (current) use of anticoagulants: Secondary | ICD-10-CM

## 2018-11-27 DIAGNOSIS — Z7951 Long term (current) use of inhaled steroids: Secondary | ICD-10-CM

## 2018-11-27 DIAGNOSIS — Y95 Nosocomial condition: Secondary | ICD-10-CM | POA: Diagnosis present

## 2018-11-27 DIAGNOSIS — G8929 Other chronic pain: Secondary | ICD-10-CM | POA: Diagnosis present

## 2018-11-27 DIAGNOSIS — J189 Pneumonia, unspecified organism: Secondary | ICD-10-CM | POA: Diagnosis not present

## 2018-11-27 DIAGNOSIS — J449 Chronic obstructive pulmonary disease, unspecified: Secondary | ICD-10-CM | POA: Diagnosis not present

## 2018-11-27 DIAGNOSIS — R1084 Generalized abdominal pain: Secondary | ICD-10-CM | POA: Diagnosis not present

## 2018-11-27 DIAGNOSIS — Z813 Family history of other psychoactive substance abuse and dependence: Secondary | ICD-10-CM

## 2018-11-27 DIAGNOSIS — J441 Chronic obstructive pulmonary disease with (acute) exacerbation: Secondary | ICD-10-CM | POA: Diagnosis not present

## 2018-11-27 DIAGNOSIS — A419 Sepsis, unspecified organism: Secondary | ICD-10-CM | POA: Diagnosis not present

## 2018-11-27 DIAGNOSIS — I10 Essential (primary) hypertension: Secondary | ICD-10-CM | POA: Diagnosis not present

## 2018-11-27 DIAGNOSIS — R918 Other nonspecific abnormal finding of lung field: Secondary | ICD-10-CM | POA: Diagnosis not present

## 2018-11-27 DIAGNOSIS — F259 Schizoaffective disorder, unspecified: Secondary | ICD-10-CM

## 2018-11-27 DIAGNOSIS — Z7151 Drug abuse counseling and surveillance of drug abuser: Secondary | ICD-10-CM

## 2018-11-27 DIAGNOSIS — F17210 Nicotine dependence, cigarettes, uncomplicated: Secondary | ICD-10-CM | POA: Diagnosis present

## 2018-11-27 DIAGNOSIS — K5900 Constipation, unspecified: Secondary | ICD-10-CM | POA: Diagnosis present

## 2018-11-27 DIAGNOSIS — J9601 Acute respiratory failure with hypoxia: Secondary | ICD-10-CM | POA: Diagnosis present

## 2018-11-27 DIAGNOSIS — Z8249 Family history of ischemic heart disease and other diseases of the circulatory system: Secondary | ICD-10-CM

## 2018-11-27 DIAGNOSIS — R109 Unspecified abdominal pain: Secondary | ICD-10-CM | POA: Diagnosis not present

## 2018-11-27 DIAGNOSIS — F1410 Cocaine abuse, uncomplicated: Secondary | ICD-10-CM | POA: Diagnosis present

## 2018-11-27 DIAGNOSIS — Z6841 Body Mass Index (BMI) 40.0 and over, adult: Secondary | ICD-10-CM

## 2018-11-27 DIAGNOSIS — R52 Pain, unspecified: Secondary | ICD-10-CM | POA: Diagnosis not present

## 2018-11-27 DIAGNOSIS — Z79899 Other long term (current) drug therapy: Secondary | ICD-10-CM

## 2018-11-27 DIAGNOSIS — Z885 Allergy status to narcotic agent status: Secondary | ICD-10-CM

## 2018-11-27 DIAGNOSIS — M5489 Other dorsalgia: Secondary | ICD-10-CM | POA: Diagnosis not present

## 2018-11-27 DIAGNOSIS — M549 Dorsalgia, unspecified: Secondary | ICD-10-CM | POA: Diagnosis present

## 2018-11-27 DIAGNOSIS — R0902 Hypoxemia: Secondary | ICD-10-CM | POA: Diagnosis not present

## 2018-11-27 DIAGNOSIS — I2699 Other pulmonary embolism without acute cor pulmonale: Secondary | ICD-10-CM | POA: Diagnosis present

## 2018-11-27 DIAGNOSIS — J181 Lobar pneumonia, unspecified organism: Secondary | ICD-10-CM

## 2018-11-27 DIAGNOSIS — J440 Chronic obstructive pulmonary disease with acute lower respiratory infection: Secondary | ICD-10-CM | POA: Diagnosis present

## 2018-11-27 LAB — COMPREHENSIVE METABOLIC PANEL
ALT: 32 U/L (ref 0–44)
AST: 16 U/L (ref 15–41)
Albumin: 3.5 g/dL (ref 3.5–5.0)
Alkaline Phosphatase: 83 U/L (ref 38–126)
Anion gap: 9 (ref 5–15)
BUN: 10 mg/dL (ref 6–20)
CO2: 34 mmol/L — ABNORMAL HIGH (ref 22–32)
Calcium: 9.3 mg/dL (ref 8.9–10.3)
Chloride: 96 mmol/L — ABNORMAL LOW (ref 98–111)
Creatinine, Ser: 0.78 mg/dL (ref 0.61–1.24)
GFR calc Af Amer: >60 mL/min (ref >60)
GFR calc non Af Amer: >60 mL/min (ref >60)
Glucose, Bld: 111 mg/dL — ABNORMAL HIGH (ref 70–99)
POTASSIUM: 4 mmol/L (ref 3.5–5.1)
Sodium: 139 mmol/L (ref 135–145)
Total Bilirubin: 0.6 mg/dL (ref 0.3–1.2)
Total Protein: 7.1 g/dL (ref 6.5–8.1)

## 2018-11-27 LAB — CBC WITH DIFFERENTIAL/PLATELET
Abs Immature Granulocytes: 0.47 10*3/uL — ABNORMAL HIGH (ref 0.00–0.07)
Basophils Absolute: 0.1 10*3/uL (ref 0.0–0.1)
Basophils Relative: 0 %
Eosinophils Absolute: 0.3 10*3/uL (ref 0.0–0.5)
Eosinophils Relative: 2 %
HCT: 46.2 % (ref 39.0–52.0)
HEMOGLOBIN: 14.7 g/dL (ref 13.0–17.0)
Immature Granulocytes: 3 %
LYMPHS PCT: 21 %
Lymphs Abs: 2.9 10*3/uL (ref 0.7–4.0)
MCH: 31.7 pg (ref 26.0–34.0)
MCHC: 31.8 g/dL (ref 30.0–36.0)
MCV: 99.8 fL (ref 80.0–100.0)
Monocytes Absolute: 0.7 10*3/uL (ref 0.1–1.0)
Monocytes Relative: 5 %
Neutro Abs: 9.7 10*3/uL — ABNORMAL HIGH (ref 1.7–7.7)
Neutrophils Relative %: 69 %
Platelets: 244 10*3/uL (ref 150–400)
RBC: 4.63 MIL/uL (ref 4.22–5.81)
RDW: 12.7 % (ref 11.5–15.5)
WBC: 14.1 10*3/uL — ABNORMAL HIGH (ref 4.0–10.5)
nRBC: 0 % (ref 0.0–0.2)

## 2018-11-27 LAB — URINALYSIS, ROUTINE W REFLEX MICROSCOPIC
Bilirubin Urine: NEGATIVE
Glucose, UA: NEGATIVE mg/dL
Hgb urine dipstick: NEGATIVE
Ketones, ur: NEGATIVE mg/dL
Leukocytes,Ua: NEGATIVE
Nitrite: NEGATIVE
Protein, ur: NEGATIVE mg/dL
SPECIFIC GRAVITY, URINE: 1.011 (ref 1.005–1.030)
pH: 8 (ref 5.0–8.0)

## 2018-11-27 LAB — LIPASE, BLOOD: Lipase: 34 U/L (ref 11–51)

## 2018-11-27 LAB — PROTIME-INR
INR: 1.1 (ref 0.8–1.2)
Prothrombin Time: 13.7 seconds (ref 11.4–15.2)

## 2018-11-27 MED ORDER — SODIUM CHLORIDE (PF) 0.9 % IJ SOLN
INTRAMUSCULAR | Status: AC
  Administered 2018-11-28: 01:00:00
  Filled 2018-11-27: qty 50

## 2018-11-27 MED ORDER — HYDROMORPHONE HCL 1 MG/ML IJ SOLN
0.5000 mg | INTRAMUSCULAR | Status: DC | PRN
  Administered 2018-11-27: 0.5 mg via INTRAVENOUS
  Filled 2018-11-27: qty 1

## 2018-11-27 MED ORDER — IOPAMIDOL (ISOVUE-300) INJECTION 61%
100.0000 mL | Freq: Once | INTRAVENOUS | Status: AC | PRN
  Administered 2018-11-27: 100 mL via INTRAVENOUS

## 2018-11-27 MED ORDER — SODIUM CHLORIDE 0.9 % IV BOLUS
1000.0000 mL | Freq: Once | INTRAVENOUS | Status: AC
  Administered 2018-11-27: 1000 mL via INTRAVENOUS

## 2018-11-27 MED ORDER — SODIUM CHLORIDE 0.9 % IV SOLN
1.0000 g | Freq: Once | INTRAVENOUS | Status: AC
  Administered 2018-11-28: 1 g via INTRAVENOUS
  Filled 2018-11-27: qty 1

## 2018-11-27 MED ORDER — IOPAMIDOL (ISOVUE-300) INJECTION 61%
INTRAVENOUS | Status: AC
  Filled 2018-11-27: qty 100

## 2018-11-27 MED ORDER — SODIUM CHLORIDE 0.9 % IV SOLN: INTRAVENOUS | Status: DC

## 2018-11-27 MED ORDER — VANCOMYCIN HCL 10 G IV SOLR
2500.0000 mg | Freq: Once | INTRAVENOUS | Status: AC
  Administered 2018-11-28: 2500 mg via INTRAVENOUS
  Filled 2018-11-27: qty 2000

## 2018-11-27 MED ORDER — ONDANSETRON HCL 4 MG/2ML IJ SOLN
4.0000 mg | Freq: Once | INTRAMUSCULAR | Status: AC
  Administered 2018-11-27: 4 mg via INTRAVENOUS
  Filled 2018-11-27: qty 2

## 2018-11-27 NOTE — ED Notes (Signed)
Pt has urinal at bedside and is attempting to provide specimen

## 2018-11-27 NOTE — ED Notes (Signed)
EDP at bedside  

## 2018-11-27 NOTE — ED Triage Notes (Signed)
Pt complains of abdominal pain and back pain  Pt states the pain is due to blood clots that he has in his lungs and legs

## 2018-11-27 NOTE — ED Notes (Signed)
Pt placed on 4L of oxygen, per [employer], RN

## 2018-11-27 NOTE — ED Notes (Signed)
Portable x-ray at beside

## 2018-11-27 NOTE — ED Provider Notes (Signed)
[facility]-EMERGENCY DEPT Provider Note   CSN: [id number] Arrival date & time: 11/27/18  2046    History   Chief Complaint Chief Complaint  Patient presents with  . Back Pain    HPI [name] is a 43 y.o. male.     HPI Patient was recently admitted to the hospital ago after being diagnosed with pulmonary embolism.  Patient had a CT scan on February 17 that showed multifocal acute PEs.  Patient was discharged from the hospital February 24.  Patient states in the last day or so he has had trouble with pain in the right side of his abdomen and flank.  He is not sure of its related to his blood clots.  He has been having pain in his chest and does feel short of breath.  Patient states his legs are also still swollen.  He has been taking his blood thinning medications.  He denies any fevers.  No nausea vomiting.  No dysuria.  He has been having trouble with constipation.  This evening his symptoms were worse so he came to the ED Past Medical History:  Diagnosis Date  . Chronic back pain   . Cocaine use disorder (HCC)   . COPD (chronic obstructive pulmonary disease) (HCC)   . History of suicidal ideation   . Hypertension   . PE (pulmonary thromboembolism) (HCC) 10/2018   recurrent  . Schizophrenic disorder [facility])     Patient Active Problem List   Diagnosis Date Noted  . Acute respiratory failure with hypoxia (HCC) 11/13/2018  . Pulmonary emboli (HCC) 11/13/2018  . COPD (chronic obstructive pulmonary disease) (HCC)   . Hypertension   . Chronic back pain   . Pulmonary embolism (HCC) 12/13/2017  . recurrent Pulmonary embolism, bilateral (HCC)  3/19 and 2/20   . Paranoid schizophrenia (HCC)   . Cocaine abuse with cocaine-induced mood disorder (HCC) 06/30/2017  . Substance induced mood disorder (HCC) 11/29/2015  . Homeless   . Suicidal ideation   . Schizoaffective disorder, unspecified type (HCC)   . Schizoaffective disorder (HCC) 05/08/2013  . Cocaine  abuse (HCC) 11/11/2011    History reviewed. No pertinent surgical history.      Home Medications    Prior to Admission medications   Medication Sig Start Date End Date Taking? Authorizing Provider  BREO ELLIPTA 200-25 MCG/INH AEPB Inhale 1 puff into the lungs daily.  11/25/18  Yes [provider]  furosemide (LASIX) 40 MG tablet Take 1 tablet (40 mg total) by mouth daily. 11/20/18 11/20/19 Yes Meredeth Ide, MD  gabapentin (NEURONTIN) 400 MG capsule Take 1,200 mg by mouth 4 (four) times daily.  11/20/18  Yes [provider]  OLANZapine (ZYPREXA) 20 MG tablet Take 1 tablet (20 mg total) by mouth at bedtime. 11/20/18  Yes Meredeth Ide, MD  paliperidone (INVEGA SUSTENNA) 156 MG/ML SUSY injection Inject 156 mg into the muscle every 30 (thirty) days. On the 4th of each month   Yes [provider]  Rivaroxaban (XARELTO) 15 MG TABS tablet Take 1 tablet (15 mg total) by mouth 2 (two) times daily with a meal for 14 days. 11/20/18 12/04/18 Yes Lama, Sarina Ill, MD  albuterol (PROVENTIL HFA;VENTOLIN HFA) 108 (90 Base) MCG/ACT inhaler Inhale 2 puffs into the lungs every 6 (six) hours as needed for wheezing or shortness of breath. 11/20/18   Meredeth Ide, MD  gabapentin (NEURONTIN) 800 MG tablet Take 1 tablet (800 mg total) by mouth 3 (three) times  daily. 11/20/18   Meredeth Ide, MD  hydrochlorothiazide (MICROZIDE) 12.5 MG capsule Take 12.5 mg by mouth daily. 11/25/18   [provider]  metoprolol succinate (TOPROL-XL) 25 MG 24 hr tablet Take 25 mg by mouth daily. 11/25/18   [provider]  predniSONE (DELTASONE) 10 MG tablet Prednisone 40 mg po daily x 1 day then Prednisone 30 mg po daily x 1 day then Prednisone 20 mg po daily x 1 day then Prednisone 10 mg daily x 1 day then stop... Patient not taking: Reported on 11/27/2018 11/20/18   Meredeth Ide, MD  rivaroxaban (XARELTO) 20 MG TABS tablet Take 1 tablet (20 mg total) by mouth daily with supper. 12/05/18   Meredeth Ide, MD  senna (SENOKOT) 8.6 MG TABS tablet Take 1 tablet (8.6 mg total) by mouth 2 (two) times daily as needed for up to 10 days for mild constipation. Patient not taking: Reported on 11/27/2018 11/20/18 11/30/18  Meredeth Ide, MD    Family History Family History  Problem Relation Age of Onset  . Cancer Mother   . Heart disease Father   . Drug abuse Brother     Social History Social History   Tobacco Use  . Smoking status: Current Every Day Smoker    Packs/day: 2.50    Years: 15.00    Pack years: 37.50    Types: Cigarettes  . Smokeless tobacco: Never Used  Substance Use Topics  . Alcohol use: No    Comment: seldom  . Drug use: Yes    Types: "Crack" cocaine, Cocaine     Allergies   Haldol [haloperidol decanoate]   Review of Systems Review of Systems  All other systems reviewed and are negative.    Physical Exam Updated Vital Signs BP 128/89 (BP Location: Left Arm)   Pulse (!) 110   Temp 98.6 F (37 C) (Oral)   Resp (!) 28   SpO2 94%   Physical Exam Vitals signs and nursing note reviewed.  Constitutional:      General: He is not in acute distress.    Appearance: He is well-developed.  HENT:     Head: Normocephalic and atraumatic.     Right Ear: External ear normal.     Left Ear: External ear normal.  Eyes:     General: No scleral icterus.       Right eye: No discharge.        Left eye: No discharge.     Conjunctiva/sclera: Conjunctivae normal.  Neck:     Musculoskeletal: Neck supple.     Trachea: No tracheal deviation.  Cardiovascular:     Rate and Rhythm: Normal rate and regular rhythm.  Pulmonary:     Effort: Pulmonary effort is normal. No respiratory distress.     Breath sounds: Normal breath sounds. No stridor. No wheezing or rales.  Abdominal:     General: Bowel sounds are normal. There is no distension.     Palpations: Abdomen is soft.     Tenderness: There is abdominal tenderness in the right upper quadrant and right lower quadrant. There is  right CVA tenderness. There is no guarding or rebound.  Musculoskeletal:        General: No tenderness.  Skin:    General: Skin is warm and dry.     Findings: No rash.  Neurological:     Mental Status: He is alert.     Cranial Nerves: No cranial nerve deficit (no facial droop, extraocular movements  intact, no slurred speech).     Sensory: No sensory deficit.     Motor: No abnormal muscle tone or seizure activity.     Coordination: Coordination normal.      ED Treatments / Results  Labs (all labs ordered are listed, but only abnormal results are displayed) Labs Reviewed  COMPREHENSIVE METABOLIC PANEL - Abnormal; Notable for the following components:      Result Value   Chloride 96 (*)    CO2 34 (*)    Glucose, Bld 111 (*)    All other components within normal limits  CBC WITH DIFFERENTIAL/PLATELET - Abnormal; Notable for the following components:   WBC 14.1 (*)    Neutro Abs 9.7 (*)    Abs Immature Granulocytes 0.47 (*)    All other components within normal limits  URINALYSIS, ROUTINE W REFLEX MICROSCOPIC - Abnormal; Notable for the following components:   APPearance CLOUDY (*)    All other components within normal limits  LIPASE, BLOOD  PROTIME-INR    EKG EKG Interpretation  Date/Time:  Monday November 27 2018 22:04:29 EST Ventricular Rate:  110 PR Interval:    QRS Duration: 87 QT Interval:  309 QTC Calculation: 418 R Axis:   94 Text Interpretation:  Sinus tachycardia Borderline right axis deviation Low voltage, precordial leads Since last tracing rate faster Confirmed by Linwood Dibbles [id number]) on 11/27/2018 10:07:37 PM   Radiology Ct Abdomen Pelvis W Contrast  Result Date: 11/27/2018 CLINICAL DATA:  Abdomen and back pain EXAM: CT ABDOMEN AND PELVIS WITH CONTRAST TECHNIQUE: Multidetector CT imaging of the abdomen and pelvis was performed using the standard protocol following bolus administration of intravenous contrast. CONTRAST:  ISOVUE-300 IOPAMIDOL (ISOVUE-300)  INJECTION 61% COMPARISON:  CT 01/31/2016 FINDINGS: Lower chest: Lung bases demonstrate small bilate right pleural effusion. Patchy consolidation and adjacent ground-glass density in the posterior right lower lobe suspicious for pneumonia. Heart size within normal limits. Hepatobiliary: Small calcified stones in the gallbladder. No biliary dilatation or focal hepatic abnormality Pancreas: Unremarkable. No pancreatic ductal dilatation or surrounding inflammatory changes. Spleen: Normal in size without focal abnormality. Adrenals/Urinary Tract: Adrenal glands are unremarkable. Kidneys are normal, without renal calculi, focal lesion, or hydronephrosis. Bladder is unremarkable. Stomach/Bowel: Stomach is within normal limits. Appendix appears normal. No evidence of bowel wall thickening, distention, or inflammatory changes. Vascular/Lymphatic: No significant vascular findings are present. No enlarged abdominal or pelvic lymph nodes. Reproductive: Prostate is unremarkable. Other: Negative for free air or free fluid. Musculoskeletal: Probable L4 vertebral hemangioma. No acute or suspicious abnormality IMPRESSION: 1. Small right-sided pleural effusion with partial consolidation in the posterior right lower lobe suspicious for a pneumonia. 2. No CT evidence for acute intra-abdominal or pelvic abnormality. 3. Probable gallstones Electronically Signed   By: Jasmine Pang M.D.   On: 11/27/2018 23:33   Dg Chest Portable 1 View  Result Date: 11/27/2018 CLINICAL DATA:  Abdominal pain and right flank pain EXAM: PORTABLE CHEST 1 VIEW COMPARISON:  11/18/2018 FINDINGS: Shallow lung inflation with mild cardiomegaly. No pulmonary edema or focal airspace consolidation. No pleural effusion. IMPRESSION: Shallow lung inflation without acute airspace disease. Electronically Signed   By: Deatra Robinson M.D.   On: 11/27/2018 22:29    Procedures Procedures (including critical care time)  Medications Ordered in ED Medications  sodium  chloride 0.9 % bolus 1,000 mL (1,000 mLs Intravenous New Bag/Given 11/27/18 2222)    And  0.9 %  sodium chloride infusion (has no administration in time range)  HYDROmorphone (DILAUDID) injection 0.5  mg (0.5 mg Intravenous Given 11/27/18 2222)  iopamidol (ISOVUE-300) 61 % injection (has no administration in time range)  sodium chloride (PF) 0.9 % injection (has no administration in time range)  ceFEPIme (MAXIPIME) 1 g in sodium chloride 0.9 % 100 mL IVPB (has no administration in time range)  ondansetron (ZOFRAN) injection 4 mg (4 mg Intravenous Given 11/27/18 2222)  iopamidol (ISOVUE-300) 61 % injection 100 mL (100 mLs Intravenous Contrast Given 11/27/18 2317)     Initial Impression / Assessment and Plan / ED Course  I have reviewed the triage vital signs and the nursing notes.  Pertinent labs & imaging results that were available during my care of the patient were reviewed by me and considered in my medical decision making (see chart for details).  Clinical Course as of Nov 27 2347  Mon Nov 27, 2018  2158 Initial O2 sat was 88%.  Patient improved with nasal cannula oxygen   [JK]    Clinical Course User Index [JK] Linwood Dibbles, MD     Patient presents to the ED with worsening pain in his chest associated with increasing shortness of breath.  In the ED the patient was noted to be hypoxic however discharge records indicate he supposed to be on 5 L of nasal cannula oxygen.  Patient's laboratory tests are notable for an elevated blood cell count.  Chest x-ray did not show evidence of pneumonia however his CT scan of his abdomen pelvis does show evidence of pneumonia.  I suspect this is the cause of his increasing abdominal pain.  Final Clinical Impressions(s) / ED Diagnoses   Final diagnoses:  Pneumonia of right lower lobe due to infectious organism [facility])      Linwood Dibbles, MD 11/27/18 2349

## 2018-11-27 NOTE — H&P (Addendum)
History and Physical    [name]:[id number] DOB: 19-Dec-1975 DOA: 11/27/2018  Referring MD/NP/PA:   PCP: Patient, No Pcp Per   Patient coming from:  The patient is coming from home.  At baseline, pt is independent for most of ADL.        Chief Complaint: right flank pain and shortness of breath  HPI: [name] is a 43 y.o. male with medical history significant of hypertension, COPD, cocaine abuse, tobacco abuse, PE on Xarelto, schizophrenia, chronic back pain, who presents with right flank pain and shortness of breath.  Patient was recently hospitalized from 2/17-2/24 due to shortness of breath, patient was found to have pulmonary edema, but 2D echo on 11/15/2018 showed EF 60-65%.  Patient was also found to have multifocal PE by CTA, and started on Xarelto.  Patient states that he has been taking Xarelto consistently. He states that he has been having right flank pain in the past 2 days. The pain is is constant, 10 out of 10 in severity, sharp, radiating to the right sided abdomen, aggravated by movement or deep breaths.  It is associated with shortness of breath.  He also has dry cough.  He has a subjective fever, but his temperature is 98.6 in ED.  He states that he has some mild chest pain. Patient does not have nausea, vomiting, diarrhea.  No symptoms of UTI or unilateral weakness. This evening his symptoms were worse so he came to the ED  ED Course: pt was found to have WBC 14.1, INR 1.1, lipase 34, negative urinalysis, electrolytes renal function okay, temperature normal, tachycardia, tachypnea, oxygen saturation 88% on room air, chest x-ray negative.  Patient is admitted to telemetry bed as inpatient.  # CT abdomen/pelvis showed: 1. Small right-sided pleural effusion with partial consolidation in the posterior right lower lobe suspicious for a pneumonia. 2. No CT evidence for acute intra-abdominal or pelvic abnormality. 3. Probable gallstones   Review of Systems:    General: has subjective fevers, chills, no body weight gain, has fatigue HEENT: no blurry vision, hearing changes or sore throat Respiratory: no dyspnea, coughing, wheezing CV: Has chest pain, no palpitations GI: no nausea, vomiting, has abdominal pain, no diarrhea, has constipation GU: no dysuria, burning on urination, increased urinary frequency, hematuria  Ext: Has mild leg edema Neuro: no unilateral weakness, numbness, or tingling, no vision change or hearing loss Skin: no rash, no skin tear. MSK: No muscle spasm, no deformity, no limitation of range of movement in spin Heme: No easy bruising.  Travel history: No recent long distant travel.  Allergy:  Allergies  Allergen Reactions  . Haldol [Haloperidol Decanoate] Other (See Comments)    'I curl up"    Past Medical History:  Diagnosis Date  . Chronic back pain   . Cocaine use disorder (HCC)   . COPD (chronic obstructive pulmonary disease) (HCC)   . History of suicidal ideation   . Hypertension   . PE (pulmonary thromboembolism) (HCC) 10/2018   recurrent  . Schizophrenic disorder (HCC)     History reviewed. No pertinent surgical history.  Social History:  reports that he has been smoking cigarettes. He has a 37.50 pack-year smoking history. He has never used smokeless tobacco. He reports current drug use. Drugs: "Crack" cocaine and Cocaine. He reports that he does not drink alcohol.  Family History:  Family History  Problem Relation Age of Onset  . Cancer Mother   . Heart disease Father   . Drug  abuse Brother      Prior to Admission medications   Medication Sig Start Date End Date Taking? Authorizing Provider  BREO ELLIPTA 200-25 MCG/INH AEPB Inhale 1 puff into the lungs daily.  11/25/18  Yes [provider]  furosemide (LASIX) 40 MG tablet Take 1 tablet (40 mg total) by mouth daily. 11/20/18 11/20/19 Yes Meredeth Ide, MD  gabapentin (NEURONTIN) 400 MG capsule Take 1,200 mg by mouth 4 (four) times daily.   11/20/18  Yes [provider]  OLANZapine (ZYPREXA) 20 MG tablet Take 1 tablet (20 mg total) by mouth at bedtime. 11/20/18  Yes Meredeth Ide, MD  paliperidone (INVEGA SUSTENNA) 156 MG/ML SUSY injection Inject 156 mg into the muscle every 30 (thirty) days. On the 4th of each month   Yes [provider]  Rivaroxaban (XARELTO) 15 MG TABS tablet Take 1 tablet (15 mg total) by mouth 2 (two) times daily with a meal for 14 days. 11/20/18 12/04/18 Yes Lama, Sarina Ill, MD  albuterol (PROVENTIL HFA;VENTOLIN HFA) 108 (90 Base) MCG/ACT inhaler Inhale 2 puffs into the lungs every 6 (six) hours as needed for wheezing or shortness of breath. 11/20/18   Meredeth Ide, MD  gabapentin (NEURONTIN) 800 MG tablet Take 1 tablet (800 mg total) by mouth 3 (three) times daily. 11/20/18   Meredeth Ide, MD  hydrochlorothiazide (MICROZIDE) 12.5 MG capsule Take 12.5 mg by mouth daily. 11/25/18   [provider]  metoprolol succinate (TOPROL-XL) 25 MG 24 hr tablet Take 25 mg by mouth daily. 11/25/18   [provider]  predniSONE (DELTASONE) 10 MG tablet Prednisone 40 mg po daily x 1 day then Prednisone 30 mg po daily x 1 day then Prednisone 20 mg po daily x 1 day then Prednisone 10 mg daily x 1 day then stop... Patient not taking: Reported on 11/27/2018 11/20/18   Meredeth Ide, MD  rivaroxaban (XARELTO) 20 MG TABS tablet Take 1 tablet (20 mg total) by mouth daily with supper. 12/05/18   Meredeth Ide, MD  senna (SENOKOT) 8.6 MG TABS tablet Take 1 tablet (8.6 mg total) by mouth 2 (two) times daily as needed for up to 10 days for mild constipation. Patient not taking: Reported on 11/27/2018 11/20/18 11/30/18  Meredeth Ide, MD    Physical Exam: Vitals:   11/28/18 0337 11/28/18 0344 11/28/18 0400 11/28/18 0506  BP:  131/70 (!) 114/59 132/87  Pulse:  (!) 104 91 97  Resp:  15  (!) 22  Temp:      TempSrc:      SpO2: 96% 94% 93% 96%   General: Not in acute distress HEENT:       Eyes: PERRL, EOMI, no  scleral icterus.       ENT: No discharge from the ears and nose, no pharynx injection, no tonsillar enlargement.        Neck: No JVD, no bruit, no mass felt. Heme: No neck lymph node enlargement. Cardiac: S1/S2, RRR, No murmurs, No gallops or rubs. Respiratory:  No rales, wheezing, rhonchi or rubs. GI: Soft, nondistended, nontender, no rebound pain, no organomegaly, BS present. GU: No hematuria Ext: Has trace leg edema bilaterally. 2+DP/PT pulse bilaterally. Musculoskeletal: Has tenderness in the right flank area  skin: No rashes.  Neuro: Alert, oriented X3, cranial nerves II-XII grossly intact, moves all extremities normally.  Psych: Patient is not psychotic, no suicidal or hemocidal ideation.  Labs on Admission: I have personally reviewed following labs and imaging studies  CBC: Recent Labs  Lab 11/27/18 2156  WBC 14.1*  NEUTROABS 9.7*  HGB 14.7  HCT 46.2  MCV 99.8  PLT 244   Basic Metabolic Panel: Recent Labs  Lab 11/27/18 2156  NA 139  K 4.0  CL 96*  CO2 34*  GLUCOSE 111*  BUN 10  CREATININE 0.78  CALCIUM 9.3   GFR: Estimated Creatinine Clearance: 179.1 mL/min (by C-G formula based on SCr of 0.78 mg/dL). Liver Function Tests: Recent Labs  Lab 11/27/18 2156  AST 16  ALT 32  ALKPHOS 83  BILITOT 0.6  PROT 7.1  ALBUMIN 3.5   Recent Labs  Lab 11/27/18 2156  LIPASE 34   No results for input(s): AMMONIA in the last 168 hours. Coagulation Profile: Recent Labs  Lab 11/27/18 2156  INR 1.1   Cardiac Enzymes: No results for input(s): CKTOTAL, CKMB, CKMBINDEX, TROPONINI in the last 168 hours. BNP (last 3 results) No results for input(s): PROBNP in the last 8760 hours. HbA1C: No results for input(s): HGBA1C in the last 72 hours. CBG: No results for input(s): GLUCAP in the last 168 hours. Lipid Profile: No results for input(s): CHOL, HDL, LDLCALC, TRIG, CHOLHDL, LDLDIRECT in the last 72 hours. Thyroid Function Tests: No results for input(s): TSH,  T4TOTAL, FREET4, T3FREE, THYROIDAB in the last 72 hours. Anemia Panel: No results for input(s): VITAMINB12, FOLATE, FERRITIN, TIBC, IRON, RETICCTPCT in the last 72 hours. Urine analysis:    Component Value Date/Time   COLORURINE YELLOW 11/27/2018 2213   APPEARANCEUR CLOUDY (A) 11/27/2018 2213   LABSPEC 1.011 11/27/2018 2213   PHURINE 8.0 11/27/2018 2213   GLUCOSEU NEGATIVE 11/27/2018 2213   HGBUR NEGATIVE 11/27/2018 2213   BILIRUBINUR NEGATIVE 11/27/2018 2213   KETONESUR NEGATIVE 11/27/2018 2213   PROTEINUR NEGATIVE 11/27/2018 2213   UROBILINOGEN 1.0 08/02/2015 1725   NITRITE NEGATIVE 11/27/2018 2213   LEUKOCYTESUR NEGATIVE 11/27/2018 2213   Sepsis Labs: (procalcitonin:4,lacticidven:4) )No results found for this or any previous visit (from the past 240 hour(s)).   Radiological Exams on Admission: Ct Abdomen Pelvis W Contrast  Result Date: 11/27/2018 CLINICAL DATA:  Abdomen and back pain EXAM: CT ABDOMEN AND PELVIS WITH CONTRAST TECHNIQUE: Multidetector CT imaging of the abdomen and pelvis was performed using the standard protocol following bolus administration of intravenous contrast. CONTRAST:  ISOVUE-300 IOPAMIDOL (ISOVUE-300) INJECTION 61% COMPARISON:  CT 01/31/2016 FINDINGS: Lower chest: Lung bases demonstrate small bilate right pleural effusion. Patchy consolidation and adjacent ground-glass density in the posterior right lower lobe suspicious for pneumonia. Heart size within normal limits. Hepatobiliary: Small calcified stones in the gallbladder. No biliary dilatation or focal hepatic abnormality Pancreas: Unremarkable. No pancreatic ductal dilatation or surrounding inflammatory changes. Spleen: Normal in size without focal abnormality. Adrenals/Urinary Tract: Adrenal glands are unremarkable. Kidneys are normal, without renal calculi, focal lesion, or hydronephrosis. Bladder is unremarkable. Stomach/Bowel: Stomach is within normal limits. Appendix appears normal. No  evidence of bowel wall thickening, distention, or inflammatory changes. Vascular/Lymphatic: No significant vascular findings are present. No enlarged abdominal or pelvic lymph nodes. Reproductive: Prostate is unremarkable. Other: Negative for free air or free fluid. Musculoskeletal: Probable L4 vertebral hemangioma. No acute or suspicious abnormality IMPRESSION: 1. Small right-sided pleural effusion with partial consolidation in the posterior right lower lobe suspicious for a pneumonia. 2. No CT evidence for acute intra-abdominal or pelvic abnormality. 3. Probable gallstones Electronically Signed   By: Jasmine Pang M.D.   On: 11/27/2018 23:33   Dg Chest Portable 1 View  Result Date: 11/27/2018 CLINICAL  DATA:  Abdominal pain and right flank pain EXAM: PORTABLE CHEST 1 VIEW COMPARISON:  11/18/2018 FINDINGS: Shallow lung inflation with mild cardiomegaly. No pulmonary edema or focal airspace consolidation. No pleural effusion. IMPRESSION: Shallow lung inflation without acute airspace disease. Electronically Signed   By: Deatra RobinsonKevin  Herman M.D.   On: 11/27/2018 22:29     EKG: Independently reviewed.  Sinus rhythm, tachycardia, QTC 418, low voltage, nonspecific T wave change.    Assessment/Plan Principal Problem:   HCAP (healthcare-associated pneumonia) Active Problems:   Cocaine abuse (HCC)   Schizoaffective disorder (HCC)   Pulmonary embolism (HCC)   Acute respiratory failure with hypoxia (HCC)   COPD exacerbation (HCC)   Hypertension   Sepsis (HCC)   Sepsis and acute respiratory failure with hypoxia due to possible HCAP: Patient has shortness breath, right flank pain, leukocytosis, CT abdomen/pelvis showed possible right lower lobe infiltration with small right pleural effusion, indicating possible HCAP.  Patient meets criteria for sepsis with leukocytosis, tachycardia and tachypnea.   - will admit to tele bed as inpt - IV Vancomycin and cefepime, z-pak - Mucinex for cough  - atrovent nebs and  Xopenex Neb prn for SOB - Urine legionella and S. pneumococcal antigen - Follow up blood culture x2, sputum culture and respiratory virus panel,  Flu pcr - will get Procalcitonin and trend lactic acid level per sepsis protocol - IVF: 1L of NS bolus in ED, followed by 75 mL per hour of NS (patient has recent pulm edma, limiting aggressive IV fluids treatment)  COPD exacerbation: -see above  Hx of cocaine abuse (HCC): -Did counseling about importance of quitting substance use  Schizoaffective disorder (HCC): -continue home Invega injection q30 days (due on 11/29/18) -continue Olanzapin  HTN:  -Continue home medications: Metoprolol -IV hydralazine prn  PE: -continue xarelto  Inpatient status:  # Patient requires inpatient status due to high intensity of service, high risk for further deterioration and high frequency of surveillance required.  I certify that at the point of admission it is my clinical judgment that the patient will require inpatient hospital care spanning beyond 2 midnights from the point of admission.  . This patient has multiple chronic comorbidities including hypertension, COPD, cocaine abuse, tobacco abuse, PE on Xarelto, schizophrenia, chronic back pain. . Now patient has presenting with cough, shortness of breath, right flank pain . The worrisome physical exam findings include tenderness in right flank area, respiratory distress . The initial radiographic and laboratory data are worrisome because of leukocytosis, infiltration in right lower lobe by CT scan. . Current medical needs: please see my assessment and plan . Predictability of an adverse outcome (risk): Patient has multiple comorbidities, now presents with acute respiratory distress with hypoxia due to possible HCAP.  Pt also has sepsis. Patient has hx of multifocal PE which was diagnosed recently.  Patient is at high risk of deteriorating, will need to be treated in hospital for at least 2 days.     DVT  ppx: on Xarelto Code Status: Full code Family Communication: None at bed side.  Disposition Plan:  Anticipate discharge back to previous home environment Consults called:  none Admission status:   Inpatient/tele      Date of Service 11/28/2018    Lorretta HarpXilin Mj Willis [facility]   If 7PM-7AM, please contact night-coverage [email or web address] Password [email or web address] 11/28/2018, 5:50 AM

## 2018-11-28 ENCOUNTER — Other Ambulatory Visit: Payer: Self-pay

## 2018-11-28 DIAGNOSIS — Z885 Allergy status to narcotic agent status: Secondary | ICD-10-CM | POA: Diagnosis not present

## 2018-11-28 DIAGNOSIS — I10 Essential (primary) hypertension: Secondary | ICD-10-CM | POA: Diagnosis not present

## 2018-11-28 DIAGNOSIS — J189 Pneumonia, unspecified organism: Secondary | ICD-10-CM | POA: Diagnosis present

## 2018-11-28 DIAGNOSIS — A419 Sepsis, unspecified organism: Secondary | ICD-10-CM | POA: Diagnosis present

## 2018-11-28 DIAGNOSIS — Y95 Nosocomial condition: Secondary | ICD-10-CM | POA: Diagnosis present

## 2018-11-28 DIAGNOSIS — K5900 Constipation, unspecified: Secondary | ICD-10-CM | POA: Diagnosis present

## 2018-11-28 DIAGNOSIS — F1410 Cocaine abuse, uncomplicated: Secondary | ICD-10-CM

## 2018-11-28 DIAGNOSIS — M549 Dorsalgia, unspecified: Secondary | ICD-10-CM | POA: Diagnosis present

## 2018-11-28 DIAGNOSIS — Z7901 Long term (current) use of anticoagulants: Secondary | ICD-10-CM | POA: Diagnosis not present

## 2018-11-28 DIAGNOSIS — J181 Lobar pneumonia, unspecified organism: Secondary | ICD-10-CM | POA: Diagnosis present

## 2018-11-28 DIAGNOSIS — Z7151 Drug abuse counseling and surveillance of drug abuser: Secondary | ICD-10-CM | POA: Diagnosis not present

## 2018-11-28 DIAGNOSIS — F259 Schizoaffective disorder, unspecified: Secondary | ICD-10-CM | POA: Diagnosis present

## 2018-11-28 DIAGNOSIS — Z79899 Other long term (current) drug therapy: Secondary | ICD-10-CM | POA: Diagnosis not present

## 2018-11-28 DIAGNOSIS — G8929 Other chronic pain: Secondary | ICD-10-CM | POA: Diagnosis present

## 2018-11-28 DIAGNOSIS — Z6841 Body Mass Index (BMI) 40.0 and over, adult: Secondary | ICD-10-CM | POA: Diagnosis not present

## 2018-11-28 DIAGNOSIS — J441 Chronic obstructive pulmonary disease with (acute) exacerbation: Principal | ICD-10-CM

## 2018-11-28 DIAGNOSIS — Z7951 Long term (current) use of inhaled steroids: Secondary | ICD-10-CM | POA: Diagnosis not present

## 2018-11-28 DIAGNOSIS — Z813 Family history of other psychoactive substance abuse and dependence: Secondary | ICD-10-CM | POA: Diagnosis not present

## 2018-11-28 DIAGNOSIS — J9611 Chronic respiratory failure with hypoxia: Secondary | ICD-10-CM | POA: Diagnosis not present

## 2018-11-28 DIAGNOSIS — F17210 Nicotine dependence, cigarettes, uncomplicated: Secondary | ICD-10-CM | POA: Diagnosis present

## 2018-11-28 DIAGNOSIS — Z8249 Family history of ischemic heart disease and other diseases of the circulatory system: Secondary | ICD-10-CM | POA: Diagnosis not present

## 2018-11-28 DIAGNOSIS — J440 Chronic obstructive pulmonary disease with acute lower respiratory infection: Secondary | ICD-10-CM | POA: Diagnosis present

## 2018-11-28 DIAGNOSIS — I2699 Other pulmonary embolism without acute cor pulmonale: Secondary | ICD-10-CM | POA: Diagnosis not present

## 2018-11-28 LAB — RAPID URINE DRUG SCREEN, HOSP PERFORMED
Amphetamines: NOT DETECTED
Barbiturates: NOT DETECTED
Benzodiazepines: NOT DETECTED
Cocaine: NOT DETECTED
Opiates: NOT DETECTED
Tetrahydrocannabinol: NOT DETECTED

## 2018-11-28 LAB — LACTIC ACID, PLASMA
LACTIC ACID, VENOUS: 1.2 mmol/L (ref 0.5–1.9)
Lactic Acid, Venous: 1.4 mmol/L (ref 0.5–1.9)

## 2018-11-28 LAB — INFLUENZA PANEL BY PCR (TYPE A & B)
Influenza A By PCR: NEGATIVE
Influenza B By PCR: NEGATIVE

## 2018-11-28 LAB — BRAIN NATRIURETIC PEPTIDE: B NATRIURETIC PEPTIDE 5: 16.8 pg/mL (ref 0.0–100.0)

## 2018-11-28 LAB — HIV ANTIBODY (ROUTINE TESTING W REFLEX): HIV Screen 4th Generation wRfx: NONREACTIVE

## 2018-11-28 LAB — STREP PNEUMONIAE URINARY ANTIGEN: Strep Pneumo Urinary Antigen: NEGATIVE

## 2018-11-28 LAB — PROCALCITONIN: Procalcitonin: <0.1 ng/mL

## 2018-11-28 MED ORDER — DM-GUAIFENESIN ER 30-600 MG PO TB12: 1.0000 | ORAL_TABLET | Freq: Two times a day (BID) | ORAL | Status: DC | PRN

## 2018-11-28 MED ORDER — LEVALBUTEROL HCL 0.63 MG/3ML IN NEBU
INHALATION_SOLUTION | RESPIRATORY_TRACT | Status: AC
  Filled 2018-11-28: qty 3

## 2018-11-28 MED ORDER — LEVALBUTEROL HCL 1.25 MG/0.5ML IN NEBU
INHALATION_SOLUTION | RESPIRATORY_TRACT | Status: AC
  Filled 2018-11-28: qty 0.5

## 2018-11-28 MED ORDER — IPRATROPIUM BROMIDE 0.02 % IN SOLN
0.5000 mg | Freq: Three times a day (TID) | RESPIRATORY_TRACT | Status: DC
  Administered 2018-11-28 – 2018-11-29 (×4): 0.5 mg via RESPIRATORY_TRACT
  Filled 2018-11-28 (×3): qty 2.5

## 2018-11-28 MED ORDER — PALIPERIDONE PALMITATE ER 156 MG/ML IM SUSY
156.0000 mg | PREFILLED_SYRINGE | INTRAMUSCULAR | Status: DC
  Filled 2018-11-28: qty 1

## 2018-11-28 MED ORDER — LEVALBUTEROL HCL 1.25 MG/0.5ML IN NEBU
1.2500 mg | INHALATION_SOLUTION | Freq: Four times a day (QID) | RESPIRATORY_TRACT | Status: DC
  Administered 2018-11-28: 1.25 mg via RESPIRATORY_TRACT

## 2018-11-28 MED ORDER — HYDRALAZINE HCL 20 MG/ML IJ SOLN: 5.0000 mg | INTRAMUSCULAR | Status: DC | PRN

## 2018-11-28 MED ORDER — OXYCODONE-ACETAMINOPHEN 5-325 MG PO TABS
1.0000 | ORAL_TABLET | ORAL | Status: DC | PRN
  Administered 2018-11-28 – 2018-11-29 (×4): 1 via ORAL
  Filled 2018-11-28 (×4): qty 1

## 2018-11-28 MED ORDER — LEVOFLOXACIN 750 MG PO TABS
750.0000 mg | ORAL_TABLET | Freq: Every day | ORAL | Status: DC
  Administered 2018-11-28 – 2018-11-29 (×2): 750 mg via ORAL
  Filled 2018-11-28 (×2): qty 1

## 2018-11-28 MED ORDER — LEVALBUTEROL HCL 1.25 MG/0.5ML IN NEBU
1.2500 mg | INHALATION_SOLUTION | Freq: Three times a day (TID) | RESPIRATORY_TRACT | Status: DC
  Administered 2018-11-28 – 2018-11-29 (×4): 1.25 mg via RESPIRATORY_TRACT
  Filled 2018-11-28 (×3): qty 0.5

## 2018-11-28 MED ORDER — RIVAROXABAN 20 MG PO TABS: 20.0000 mg | ORAL_TABLET | Freq: Every day | ORAL | Status: DC

## 2018-11-28 MED ORDER — RIVAROXABAN 15 MG PO TABS
15.0000 mg | ORAL_TABLET | Freq: Two times a day (BID) | ORAL | Status: DC
  Administered 2018-11-28 – 2018-11-29 (×2): 15 mg via ORAL
  Filled 2018-11-28 (×3): qty 1

## 2018-11-28 MED ORDER — IPRATROPIUM BROMIDE 0.02 % IN SOLN
0.5000 mg | Freq: Four times a day (QID) | RESPIRATORY_TRACT | Status: DC
  Administered 2018-11-28: 0.5 mg via RESPIRATORY_TRACT

## 2018-11-28 MED ORDER — SODIUM CHLORIDE 0.9 % IV SOLN: INTRAVENOUS | Status: DC

## 2018-11-28 MED ORDER — IPRATROPIUM BROMIDE 0.02 % IN SOLN
RESPIRATORY_TRACT | Status: AC
  Filled 2018-11-28: qty 2.5

## 2018-11-28 MED ORDER — AZITHROMYCIN 250 MG PO TABS
500.0000 mg | ORAL_TABLET | Freq: Every day | ORAL | Status: DC
  Filled 2018-11-28: qty 2

## 2018-11-28 MED ORDER — ONDANSETRON HCL 4 MG/2ML IJ SOLN: 4.0000 mg | Freq: Three times a day (TID) | INTRAMUSCULAR | Status: DC | PRN

## 2018-11-28 MED ORDER — ACETAMINOPHEN 325 MG PO TABS: 650.0000 mg | ORAL_TABLET | Freq: Four times a day (QID) | ORAL | Status: DC | PRN

## 2018-11-28 MED ORDER — SODIUM CHLORIDE 0.9 % IV SOLN
1.0000 g | Freq: Three times a day (TID) | INTRAVENOUS | Status: DC
  Administered 2018-11-28: 1 g via INTRAVENOUS
  Filled 2018-11-28 (×2): qty 1

## 2018-11-28 MED ORDER — VANCOMYCIN HCL 10 G IV SOLR
1250.0000 mg | Freq: Three times a day (TID) | INTRAVENOUS | Status: DC
  Administered 2018-11-28: 1250 mg via INTRAVENOUS
  Filled 2018-11-28 (×2): qty 1250

## 2018-11-28 MED ORDER — OLANZAPINE 5 MG PO TABS
20.0000 mg | ORAL_TABLET | Freq: Every day | ORAL | Status: DC
  Administered 2018-11-28: 20 mg via ORAL
  Filled 2018-11-28: qty 4

## 2018-11-28 MED ORDER — GABAPENTIN 400 MG PO CAPS
1200.0000 mg | ORAL_CAPSULE | Freq: Four times a day (QID) | ORAL | Status: DC
  Administered 2018-11-28: 1200 mg via ORAL
  Filled 2018-11-28 (×2): qty 3

## 2018-11-28 MED ORDER — FLUTICASONE FUROATE-VILANTEROL 200-25 MCG/INH IN AEPB
1.0000 | INHALATION_SPRAY | Freq: Every day | RESPIRATORY_TRACT | Status: DC
  Administered 2018-11-29: 1 via RESPIRATORY_TRACT
  Filled 2018-11-28: qty 28

## 2018-11-28 MED ORDER — NICOTINE 21 MG/24HR TD PT24
21.0000 mg | MEDICATED_PATCH | Freq: Every day | TRANSDERMAL | Status: DC
  Filled 2018-11-28: qty 1

## 2018-11-28 MED ORDER — METOPROLOL SUCCINATE ER 25 MG PO TB24
25.0000 mg | ORAL_TABLET | Freq: Every day | ORAL | Status: DC
  Administered 2018-11-28 – 2018-11-29 (×2): 25 mg via ORAL
  Filled 2018-11-28 (×2): qty 1

## 2018-11-28 MED ORDER — AZITHROMYCIN 250 MG PO TABS: 250.0000 mg | ORAL_TABLET | Freq: Every day | ORAL | Status: DC

## 2018-11-28 NOTE — Progress Notes (Signed)
A consult was received from an ED physician for vancomycin per pharmacy dosing.  The patient's profile has been reviewed for ht/wt/allergies/indication/available labs.   A one time order has been placed for Vancomycin 2500 mg.  Further antibiotics/pharmacy consults should be ordered by admitting physician if indicated.                       Thank you, Lorenza Evangelist 11/28/2018  12:00 AM

## 2018-11-28 NOTE — ED Notes (Signed)
Unable to complete ED handoff. Filled all fields prompted by F2 but will not allow accept to chart.

## 2018-11-28 NOTE — Progress Notes (Signed)
Pharmacy Antibiotic Note  [name] is a 43 y.o. male s/p recent hospitalization for PE now c/o back pain admitted on 11/27/2018 with pneumonia.  Pharmacy has been consulted for cefepime and vancomycin dosing.  Plan: Cefepime 1 gm IV q8h Vancomycin 2500 mg x1 then 1250 mg IV q8h for est AUC = 452 Goal AUC = 400-550 F/u scr/cultures/levels     Temp (24hrs), Avg:98.6 F (37 C), Min:98.6 F (37 C), Max:98.6 F (37 C)  Recent Labs  Lab 11/27/18 2156  WBC 14.1*  CREATININE 0.78    Estimated Creatinine Clearance: 179.1 mL/min (by C-G formula based on SCr of 0.78 mg/dL).    Allergies  Allergen Reactions  . Haldol [Haloperidol Decanoate] Other (See Comments)    'I curl up"    Antimicrobials this admission: 3/3 cefepime >>  3/3 vancomycin >>   Dose adjustments this admission:   Microbiology results:  BCx:   UCx:    Sputum:    MRSA PCR:   Thank you for allowing pharmacy to be a part of this patient's care.  Lorenza Evangelist 11/28/2018 3:14 AM

## 2018-11-28 NOTE — ED Notes (Addendum)
ED TO INPATIENT HANDOFF REPORT  ED Nurse Name and Phone #: Lona Kettle Name/Age/Gender [name] 43 y.o. male Room/Bed: WOTF/NONE  Code Status   Code Status: Full Code  Home/SNF/Other Home Patient oriented to: self, place, time and situation Is this baseline? Yes   Triage Complete: Triage complete  Chief Complaint Back Pain  Triage Note Pt complains of abdominal pain and back pain  Pt states the pain is due to blood clots that he has in his lungs and legs    Allergies Allergies  Allergen Reactions  . Haldol [Haloperidol Decanoate] Other (See Comments)    'I curl up"    Level of Care/Admitting Diagnosis ED Disposition    ED Disposition Condition Comment   Admit  Hospital Area: [facility] [[id number]]  Level of Care: Telemetry [5]  Admit to tele based on following criteria: Other see comments  Comments: PE  Diagnosis: HCAP (healthcare-associated pneumonia) [161096]  Admitting Physician: Lorretta Harp [[id number]]  Attending Physician: Lorretta Harp [phone]  Estimated length of stay: past midnight tomorrow  Certification:: I certify this patient will need inpatient services for at least 2 midnights  PT Class (Do Not Modify): Inpatient [101]  PT Acc Code (Do Not Modify): Private [1]       B Medical/Surgery History Past Medical History:  Diagnosis Date  . Chronic back pain   . Cocaine use disorder (HCC)   . COPD (chronic obstructive pulmonary disease) (HCC)   . History of suicidal ideation   . Hypertension   . PE (pulmonary thromboembolism) (HCC) 10/2018   recurrent  . Schizophrenic disorder (HCC)    History reviewed. No pertinent surgical history.   A IV Location/Drains/Wounds Patient Lines/Drains/Airways Status   Active Line/Drains/Airways    Name:   Placement date:   Placement time:   Site:   Days:   Peripheral IV 11/28/18 Right Antecubital   11/28/18    1411    Antecubital   less than 1          Intake/Output Last 24  hours  Intake/Output Summary (Last 24 hours) at 11/28/2018 1529 Last data filed at 11/28/2018 1404 Gross per 24 hour  Intake 1949.42 ml  Output -  Net 1949.42 ml    Labs/Imaging Results for orders placed or performed during the hospital encounter of 11/27/18 (from the past 48 hour(s))  Comprehensive metabolic panel     Status: Abnormal   Collection Time: 11/27/18  9:56 PM  Result Value Ref Range   Sodium 139 135 - 145 mmol/L   Potassium 4.0 3.5 - 5.1 mmol/L   Chloride 96 (L) 98 - 111 mmol/L   CO2 34 (H) 22 - 32 mmol/L   Glucose, Bld 111 (H) 70 - 99 mg/dL   BUN 10 6 - 20 mg/dL   Creatinine, Ser 0.98 0.61 - 1.24 mg/dL   Calcium 9.3 8.9 - 11.9 mg/dL   Total Protein 7.1 6.5 - 8.1 g/dL   Albumin 3.5 3.5 - 5.0 g/dL   AST 16 15 - 41 U/L   ALT 32 0 - 44 U/L   Alkaline Phosphatase 83 38 - 126 U/L   Total Bilirubin 0.6 0.3 - 1.2 mg/dL   GFR calc non Af Amer >60 >60 mL/min   GFR calc Af Amer >60 >60 mL/min   Anion gap 9 5 - 15    Comment: Performed at [facility] [address] [place], [place] [address]  Lipase, blood     Status:  None   Collection Time: 11/27/18  9:56 PM  Result Value Ref Range   Lipase 34 11 - 51 U/L    Comment: Performed at [facility], [address] [place], [place] [address]  CBC WITH DIFFERENTIAL     Status: Abnormal   Collection Time: 11/27/18  9:56 PM  Result Value Ref Range   WBC 14.1 (H) 4.0 - 10.5 K/uL   RBC 4.63 4.22 - 5.81 MIL/uL   Hemoglobin 14.7 13.0 - 17.0 g/dL   HCT 62.1 30.8 - 65.7 %   MCV 99.8 80.0 - 100.0 fL   MCH 31.7 26.0 - 34.0 pg   MCHC 31.8 30.0 - 36.0 g/dL   RDW 84.6 96.2 - 95.2 %   Platelets 244 150 - 400 K/uL   nRBC 0.0 0.0 - 0.2 %   Neutrophils Relative % 69 %   Neutro Abs 9.7 (H) 1.7 - 7.7 K/uL   Lymphocytes Relative 21 %   Lymphs Abs 2.9 0.7 - 4.0 K/uL   Monocytes Relative 5 %   Monocytes Absolute 0.7 0.1 - 1.0 K/uL   Eosinophils Relative 2 %   Eosinophils Absolute 0.3 0.0 - 0.5 K/uL    Basophils Relative 0 %   Basophils Absolute 0.1 0.0 - 0.1 K/uL   Immature Granulocytes 3 %   Abs Immature Granulocytes 0.47 (H) 0.00 - 0.07 K/uL    Comment: Performed at [facility], [address], [place], [place] [address]  Protime-INR     Status: None   Collection Time: 11/27/18  9:56 PM  Result Value Ref Range   Prothrombin Time 13.7 11.4 - 15.2 seconds   INR 1.1 0.8 - 1.2    Comment: (NOTE) INR goal varies based on device and disease states. Performed at [name], [address] [place], [place] [address]   Urinalysis, Routine w reflex microscopic     Status: Abnormal   Collection Time: 11/27/18 10:13 PM  Result Value Ref Range   Color, Urine YELLOW YELLOW   APPearance CLOUDY (A) CLEAR   Specific Gravity, Urine 1.011 1.005 - 1.030   pH 8.0 5.0 - 8.0   Glucose, UA NEGATIVE NEGATIVE mg/dL   Hgb urine dipstick NEGATIVE NEGATIVE   Bilirubin Urine NEGATIVE NEGATIVE   Ketones, ur NEGATIVE NEGATIVE mg/dL   Protein, ur NEGATIVE NEGATIVE mg/dL   Nitrite NEGATIVE NEGATIVE   Leukocytes,Ua NEGATIVE NEGATIVE    Comment: Performed at [facility], [address], [place], [place] [address]  Strep pneumoniae urinary antigen     Status: None   Collection Time: 11/27/18 10:13 PM  Result Value Ref Range   Strep Pneumo Urinary Antigen NEGATIVE NEGATIVE    Comment:        Infection due to S. pneumoniae cannot be absolutely ruled out since the antigen present may be below the detection limit of the test. Performed at [facility], [address], [place], [place] [address]   Brain natriuretic peptide     Status: None   Collection Time: 11/28/18  5:18 AM  Result Value Ref Range   B Natriuretic Peptide 16.8 0.0 - 100.0 pg/mL    Comment: Performed at [facility], [address] [place], [place] [address]  Lactic acid, plasma     Status: None   Collection Time: 11/28/18  5:18 AM  Result Value  Ref Range   Lactic Acid, Venous 1.2 0.5 - 1.9 mmol/L    Comment:  Performed at [facility], [address] [place], [place] [address]  Influenza panel by PCR (type A & B)     Status: None   Collection Time: 11/28/18  5:19 AM  Result Value Ref Range   Influenza A By PCR NEGATIVE NEGATIVE   Influenza B By PCR NEGATIVE NEGATIVE    Comment: (NOTE) The Xpert Xpress Flu assay is intended as an aid in the diagnosis of  influenza and should not be used as a sole basis for treatment.  This  assay is FDA approved for nasopharyngeal swab specimens only. Nasal  washings and aspirates are unacceptable for Xpert Xpress Flu testing. Performed at [facility], [address], [place], [place] [address]   Procalcitonin     Status: None   Collection Time: 11/28/18  5:19 AM  Result Value Ref Range   Procalcitonin <0.10 ng/mL    Comment:        Interpretation: PCT (Procalcitonin) <= 0.5 ng/mL: Systemic infection (sepsis) is not likely. Local bacterial infection is possible. (NOTE)       Sepsis PCT Algorithm           Lower Respiratory Tract                                      Infection PCT Algorithm    ----------------------------     ----------------------------         PCT < 0.25 ng/mL                PCT < 0.10 ng/mL         Strongly encourage             Strongly discourage   discontinuation of antibiotics    initiation of antibiotics    ----------------------------     -----------------------------       PCT 0.25 - 0.50 ng/mL            PCT 0.10 - 0.25 ng/mL               OR       >80% decrease in PCT            Discourage initiation of                                            antibiotics      Encourage discontinuation           of antibiotics    ----------------------------     -----------------------------         PCT >= 0.50 ng/mL              PCT 0.26 - 0.50 ng/mL               AND        <80% decrease in PCT             Encourage initiation  of                                             antibiotics       Encourage continuation  of antibiotics    ----------------------------     -----------------------------        PCT >= 0.50 ng/mL                  PCT > 0.50 ng/mL               AND         increase in PCT                  Strongly encourage                                      initiation of antibiotics    Strongly encourage escalation           of antibiotics                                     -----------------------------                                           PCT <= 0.25 ng/mL                                                 OR                                        > 80% decrease in PCT                                     Discontinue / Do not initiate                                             antibiotics Performed at [facility], [address] [place], [place] [address]   HIV antibody (Routine Screening)     Status: None   Collection Time: 11/28/18  5:19 AM  Result Value Ref Range   HIV Screen 4th Generation wRfx Non Reactive Non Reactive    Comment: (NOTE) Performed At: [facility] [address] [place], [place] [id number] Jolene Schimke MD IA:[id number]    Ct Abdomen Pelvis W Contrast  Result Date: 11/27/2018 CLINICAL DATA:  Abdomen and back pain EXAM: CT ABDOMEN AND PELVIS WITH CONTRAST TECHNIQUE: Multidetector CT imaging of the abdomen and pelvis was performed using the standard protocol following bolus administration of intravenous contrast. CONTRAST:  ISOVUE-300 IOPAMIDOL (ISOVUE-300) INJECTION 61% COMPARISON:  CT 01/31/2016 FINDINGS: Lower chest: Lung bases demonstrate small bilate right pleural effusion. Patchy consolidation and adjacent ground-glass density in the posterior right lower lobe suspicious for pneumonia. Heart size within normal limits. Hepatobiliary: Small calcified stones in the gallbladder. No biliary dilatation or focal hepatic abnormality  Pancreas: Unremarkable. No pancreatic ductal dilatation or surrounding inflammatory changes. Spleen: Normal in size without focal abnormality. Adrenals/Urinary  Tract: Adrenal glands are unremarkable. Kidneys are normal, without renal calculi, focal lesion, or hydronephrosis. Bladder is unremarkable. Stomach/Bowel: Stomach is within normal limits. Appendix appears normal. No evidence of bowel wall thickening, distention, or inflammatory changes. Vascular/Lymphatic: No significant vascular findings are present. No enlarged abdominal or pelvic lymph nodes. Reproductive: Prostate is unremarkable. Other: Negative for free air or free fluid. Musculoskeletal: Probable L4 vertebral hemangioma. No acute or suspicious abnormality IMPRESSION: 1. Small right-sided pleural effusion with partial consolidation in the posterior right lower lobe suspicious for a pneumonia. 2. No CT evidence for acute intra-abdominal or pelvic abnormality. 3. Probable gallstones Electronically Signed   By: Jasmine Pang M.D.   On: 11/27/2018 23:33   Dg Chest Portable 1 View  Result Date: 11/27/2018 CLINICAL DATA:  Abdominal pain and right flank pain EXAM: PORTABLE CHEST 1 VIEW COMPARISON:  11/18/2018 FINDINGS: Shallow lung inflation with mild cardiomegaly. No pulmonary edema or focal airspace consolidation. No pleural effusion. IMPRESSION: Shallow lung inflation without acute airspace disease. Electronically Signed   By: Deatra Robinson M.D.   On: 11/27/2018 22:29    Pending Labs Unresulted Labs (From admission, onward)    Start     Ordered   11/28/18 0856  Rapid urine drug screen (hospital performed)  ONCE - STAT,   R     11/28/18 0855   11/28/18 0306  Legionella Pneumophila Serogp 1 Ur Ag  Once,   R     11/28/18 0305   11/28/18 0303  Culture, sputum-assessment  Once,   R     11/28/18 0304   11/28/18 0303  Gram stain  Once,   R     11/28/18 0304   11/28/18 0302  Culture, blood (x 2)  BLOOD CULTURE X 2,   STAT    Comments:  INITIATE  ANTIBIOTICS WITHIN 1 HOUR AFTER BLOOD CULTURES DRAWN.  If unable to obtain blood cultures, call MD immediately regarding antibiotic instructions.    11/28/18 0301   11/28/18 0302  Lactic acid, plasma  STAT Now then every 3 hours,   STAT     11/28/18 0301          Vitals/Pain Today's Vitals   11/28/18 1006 11/28/18 1255 11/28/18 1440 11/28/18 1441  BP: 118/68 (!) 108/58 117/75 117/75  Pulse: 88 (!) 105 78 78  Resp: 16 16  16   Temp:      TempSrc:      SpO2: 98% 90%  92%  PainSc:        Isolation Precautions No active isolations  Medications Medications  metoprolol succinate (TOPROL-XL) 24 hr tablet 25 mg (25 mg Oral Given 11/28/18 1440)  paliperidone (INVEGA SUSTENNA) injection 156 mg (has no administration in time range)  OLANZapine (ZYPREXA) tablet 20 mg (has no administration in time range)  Rivaroxaban (XARELTO) tablet 15 mg (has no administration in time range)  gabapentin (NEURONTIN) capsule 1,200 mg (1,200 mg Oral Not Given 11/28/18 1523)  fluticasone furoate-vilanterol (BREO ELLIPTA) 200-25 MCG/INH 1 puff (1 puff Inhalation Not Given 11/28/18 0911)  oxyCODONE-acetaminophen (PERCOCET/ROXICET) 5-325 MG per tablet 1 tablet (1 tablet Oral Given 11/28/18 0520)  nicotine (NICODERM CQ - dosed in mg/24 hours) patch 21 mg (has no administration in time range)  dextromethorphan-guaiFENesin (MUCINEX DM) 30-600 MG per 12 hr tablet 1 tablet (has no administration in time range)  hydrALAZINE (APRESOLINE) injection 5 mg (has no administration in time range)  acetaminophen (TYLENOL) tablet 650 mg (has no administration in time range)  levalbuterol (XOPENEX) 1.25 MG/0.5ML nebulizer  solution (  Not Given 11/28/18 0336)  ipratropium (ATROVENT) 0.02 % nebulizer solution (  Not Given 11/28/18 0336)  levalbuterol (XOPENEX) nebulizer solution 1.25 mg (1.25 mg Nebulization Given 11/28/18 1403)  ipratropium (ATROVENT) nebulizer solution 0.5 mg (0.5 mg Nebulization Given 11/28/18 1403)  rivaroxaban (XARELTO)  tablet 20 mg (has no administration in time range)  levofloxacin (LEVAQUIN) tablet 750 mg (has no administration in time range)  sodium chloride 0.9 % bolus 1,000 mL (0 mLs Intravenous Stopped 11/28/18 0029)  ondansetron (ZOFRAN) injection 4 mg (4 mg Intravenous Given 11/27/18 2222)  iopamidol (ISOVUE-300) 61 % injection 100 mL (100 mLs Intravenous Contrast Given 11/27/18 2317)  sodium chloride (PF) 0.9 % injection (  Given by Other 11/28/18 0030)  ceFEPIme (MAXIPIME) 1 g in sodium chloride 0.9 % 100 mL IVPB (0 g Intravenous Stopped 11/28/18 0058)  vancomycin (VANCOCIN) 2,500 mg in sodium chloride 0.9 % 500 mL IVPB (0 mg Intravenous Stopped 11/28/18 0309)    Mobility walks     Focused Assessments Pulmonary Assessment Handoff:  Lung sounds: Bilateral Breath Sounds: Diminished O2 Device: Room Air O2 Flow Rate (L/min): 4 L/min      R Recommendations: See Admitting Provider Note  [facility]  Additional Notes: none

## 2018-11-28 NOTE — Progress Notes (Signed)
PROGRESS NOTE  [name] IWL:[id number] DOB: Jul 08, 1976 DOA: 11/27/2018 PCP: Patient, No Pcp Per   LOS: 0 days   Brief Narrative / Interim history: 43 year old male with history of COPD not on oxygen, hypertension, cocaine use disorder, tobacco use disorder, recent PE on Xarelto, schizo affective disorder and chronic back pain admitted with shortness of breath and right flank pain. In ED, vital signs significant for mild tachycardia.  Hypoxemic to 88% on room air.  CMP not impressive.  CBC with leukocytosis to 14.  Chest x-ray and urinalysis negative.  CTA chest concerning for RLL pneumonia.  CT abdomen, lactic acid and procalcitonin not impressive.  Influenza and urinalysis negative.  Cultures, HIV, urine Legionella and strep pneumo pending.  Patient was admitted for sepsis and acute respiratory failure with hypoxemia due to H CAP.  Started on IV antibiotics.  Subjective: No major events.  Patient is poor historian.  He could only tells me that he has headache and he is not feeling well.  No other complaints but responds yes to most review of questions including shortness of breath, chest pain, abdominal pain, nausea and vomiting.  Assessment & Plan: Principal Problem:   HCAP (healthcare-associated pneumonia) Active Problems:   Cocaine abuse (HCC)   Schizoaffective disorder (HCC)   Pulmonary embolism (HCC)   Acute respiratory failure with hypoxia (HCC)   COPD exacerbation (HCC)   Hypertension   Sepsis (HCC)  Sepsis and acute respiratory failure with hypoxemia due to HCAP: CT chest concerning for RLL pneumonia. Sepsis physiology resolved except for leukocytosis.  Was hypoxemic to 88% on room air in ED.  Currently satting in 90s on room air.  Lactic acid and procalcitonin negative. -Vancomycin, cefepime and azithromycin 3/2-3/3 -Levaquin 750 mg daily 3/3--> -Atrovent and Xopenex for dyspnea -Mucolytic's -Follow cultures and urine test this.  Right flank pain: Urinalysis and CT of  abdomen without acute finding. Likely musculoskeletal. -PRN pain medicine.  Mild COPD exacerbation: Improving.  Likely due to HCAP as above. -Breathing treatments and antibiotics as above -Breo Ellipta. -Given improvement with the above measures, I won't initiate systemic steroid.   History of cocaine use: -Check UDS  Schizoaffective disorder: on multiple antipsychotic medications.  Stable. -Continue home medications.  Hypertension: Normotensive. -Continue home medications  Recent diagnosis of PE. -Continue home Xarelto  Tobacco use none: -Encouraged to quit. -Nicotine patch.  Scheduled Meds: . azithromycin  500 mg Oral Daily   Followed by  . [START ON 11/29/2018] azithromycin  250 mg Oral Daily  . fluticasone furoate-vilanterol  1 puff Inhalation Daily  . gabapentin  1,200 mg Oral QID  . ipratropium      . ipratropium  0.5 mg Nebulization TID  . levalbuterol      . levalbuterol  1.25 mg Nebulization TID  . metoprolol succinate  25 mg Oral Daily  . nicotine  21 mg Transdermal Daily  . OLANZapine  20 mg Oral QHS  . [START ON 11/29/2018] paliperidone  156 mg Intramuscular Q30 days  . Rivaroxaban  15 mg Oral BID WC  . [START ON 12/11/2018] rivaroxaban  20 mg Oral Q supper   Continuous Infusions: . sodium chloride    . ceFEPime (MAXIPIME) IV Stopped (11/28/18 1249)  . vancomycin 1,250 mg (11/28/18 1304)   PRN Meds:.acetaminophen, dextromethorphan-guaiFENesin, hydrALAZINE, ondansetron (ZOFRAN) IV, oxyCODONE-acetaminophen  DVT prophylaxis: On Xarelto for PE. Code Status: Full code. Family Communication: None at bedside. Disposition Plan: Remains inpatient.  Anticipate discharge in the next 24 hours if remains stable  Consultants:   None  Procedures:   None  Antimicrobials:  As above  Objective: Vitals:   11/28/18 1006 11/28/18 1255 11/28/18 1440 11/28/18 1441  BP: 118/68 (!) 108/58 117/75 117/75  Pulse: 88 (!) 105 78 78  Resp: 16 16  16   Temp:        TempSrc:      SpO2: 98% 90%  92%    Intake/Output Summary (Last 24 hours) at 11/28/2018 1447 Last data filed at 11/28/2018 1249 Gross per 24 hour  Intake 1699.42 ml  Output -  Net 1699.42 ml   There were no vitals filed for this visit.  Examination:  GENERAL: No acute distress but somewhat restless.  EYES - vision grossly intact. Sclera anicteric.  NOSE- no gross deformity or drainage MOUTH - no oral lesions noted THROAT- no swelling or erythema LUNGS:  No IWOB.  Poor respiratory effort.  No crackles or wheeze. HEART: RRR. Heart sounds normal. ABD: Bowel sounds present. Soft. Non tender.  MSK/EXT: Moves extremities. No obvious deformity. SKIN: no apparent skin lesion.  NEURO: Awake, alert and oriented to self, person place and month.  Cranial, motor and sensory grossly intact. PSYCH: somewhat restless  Data Reviewed: I have independently reviewed following labs and imaging studies   CBC: Recent Labs  Lab 11/27/18 2156  WBC 14.1*  NEUTROABS 9.7*  HGB 14.7  HCT 46.2  MCV 99.8  PLT 244   Basic Metabolic Panel: Recent Labs  Lab 11/27/18 2156  NA 139  K 4.0  CL 96*  CO2 34*  GLUCOSE 111*  BUN 10  CREATININE 0.78  CALCIUM 9.3   GFR: Estimated Creatinine Clearance: 179.1 mL/min (by C-G formula based on SCr of 0.78 mg/dL). Liver Function Tests: Recent Labs  Lab 11/27/18 2156  AST 16  ALT 32  ALKPHOS 83  BILITOT 0.6  PROT 7.1  ALBUMIN 3.5   Recent Labs  Lab 11/27/18 2156  LIPASE 34   No results for input(s): AMMONIA in the last 168 hours. Coagulation Profile: Recent Labs  Lab 11/27/18 2156  INR 1.1   Cardiac Enzymes: No results for input(s): CKTOTAL, CKMB, CKMBINDEX, TROPONINI in the last 168 hours. BNP (last 3 results) No results for input(s): PROBNP in the last 8760 hours. HbA1C: No results for input(s): HGBA1C in the last 72 hours. CBG: No results for input(s): GLUCAP in the last 168 hours. Lipid Profile: No results for input(s): CHOL,  HDL, LDLCALC, TRIG, CHOLHDL, LDLDIRECT in the last 72 hours. Thyroid Function Tests: No results for input(s): TSH, T4TOTAL, FREET4, T3FREE, THYROIDAB in the last 72 hours. Anemia Panel: No results for input(s): VITAMINB12, FOLATE, FERRITIN, TIBC, IRON, RETICCTPCT in the last 72 hours. Urine analysis:    Component Value Date/Time   COLORURINE YELLOW 11/27/2018 2213   APPEARANCEUR CLOUDY (A) 11/27/2018 2213   LABSPEC 1.011 11/27/2018 2213   PHURINE 8.0 11/27/2018 2213   GLUCOSEU NEGATIVE 11/27/2018 2213   HGBUR NEGATIVE 11/27/2018 2213   BILIRUBINUR NEGATIVE 11/27/2018 2213   KETONESUR NEGATIVE 11/27/2018 2213   PROTEINUR NEGATIVE 11/27/2018 2213   UROBILINOGEN 1.0 08/02/2015 1725   NITRITE NEGATIVE 11/27/2018 2213   LEUKOCYTESUR NEGATIVE 11/27/2018 2213   Sepsis Labs: Invalid input(s): PROCALCITONIN, LACTICIDVEN  No results found for this or any previous visit (from the past 240 hour(s)).    Radiology Studies: Ct Abdomen Pelvis W Contrast  Result Date: 11/27/2018 CLINICAL DATA:  Abdomen and back pain EXAM: CT ABDOMEN AND PELVIS WITH CONTRAST TECHNIQUE: Multidetector CT imaging of the abdomen  and pelvis was performed using the standard protocol following bolus administration of intravenous contrast. CONTRAST:  100mL ISOVUE-300 IOPAMIDOL (ISOVUE-300) INJECTION 61% COMPARISON:  CT 01/31/2016 FINDINGS: Lower chest: Lung bases demonstrate small bilate right pleural effusion. Patchy consolidation and adjacent ground-glass density in the posterior right lower lobe suspicious for pneumonia. Heart size within normal limits. Hepatobiliary: Small calcified stones in the gallbladder. No biliary dilatation or focal hepatic abnormality Pancreas: Unremarkable. No pancreatic ductal dilatation or surrounding inflammatory changes. Spleen: Normal in size without focal abnormality. Adrenals/Urinary Tract: Adrenal glands are unremarkable. Kidneys are normal, without renal calculi, focal lesion, or  hydronephrosis. Bladder is unremarkable. Stomach/Bowel: Stomach is within normal limits. Appendix appears normal. No evidence of bowel wall thickening, distention, or inflammatory changes. Vascular/Lymphatic: No significant vascular findings are present. No enlarged abdominal or pelvic lymph nodes. Reproductive: Prostate is unremarkable. Other: Negative for free air or free fluid. Musculoskeletal: Probable L4 vertebral hemangioma. No acute or suspicious abnormality IMPRESSION: 1. Small right-sided pleural effusion with partial consolidation in the posterior right lower lobe suspicious for a pneumonia. 2. No CT evidence for acute intra-abdominal or pelvic abnormality. 3. Probable gallstones Electronically Signed   By: Jasmine PangKim  Fujinaga M.D.   On: 11/27/2018 23:33   Dg Chest Portable 1 View  Result Date: 11/27/2018 CLINICAL DATA:  Abdominal pain and right flank pain EXAM: PORTABLE CHEST 1 VIEW COMPARISON:  11/18/2018 FINDINGS: Shallow lung inflation with mild cardiomegaly. No pulmonary edema or focal airspace consolidation. No pleural effusion. IMPRESSION: Shallow lung inflation without acute airspace disease. Electronically Signed   By: Deatra RobinsonKevin  Herman M.D.   On: 11/27/2018 22:29     [facility] Pager [phone]  If 7PM-7AM, please contact night-coverage [email or web address] Password [email or web address] 11/28/2018, 2:47 PM

## 2018-11-28 NOTE — ED Notes (Signed)
Pt provided food tray

## 2018-11-29 LAB — BASIC METABOLIC PANEL
Anion gap: 5 (ref 5–15)
BUN: 11 mg/dL (ref 6–20)
CHLORIDE: 99 mmol/L (ref 98–111)
CO2: 35 mmol/L — ABNORMAL HIGH (ref 22–32)
Calcium: 8.6 mg/dL — ABNORMAL LOW (ref 8.9–10.3)
Creatinine, Ser: 0.78 mg/dL (ref 0.61–1.24)
GFR calc Af Amer: >60 mL/min (ref >60)
GFR calc non Af Amer: >60 mL/min (ref >60)
Glucose, Bld: 126 mg/dL — ABNORMAL HIGH (ref 70–99)
POTASSIUM: 4 mmol/L (ref 3.5–5.1)
Sodium: 139 mmol/L (ref 135–145)

## 2018-11-29 LAB — CBC
HCT: 41.1 % (ref 39.0–52.0)
Hemoglobin: 12.8 g/dL — ABNORMAL LOW (ref 13.0–17.0)
MCH: 31.8 pg (ref 26.0–34.0)
MCHC: 31.1 g/dL (ref 30.0–36.0)
MCV: 102.2 fL — ABNORMAL HIGH (ref 80.0–100.0)
Platelets: 205 10*3/uL (ref 150–400)
RBC: 4.02 MIL/uL — ABNORMAL LOW (ref 4.22–5.81)
RDW: 12.8 % (ref 11.5–15.5)
WBC: 9.9 10*3/uL (ref 4.0–10.5)
nRBC: 0 % (ref 0.0–0.2)

## 2018-11-29 LAB — LEGIONELLA PNEUMOPHILA SEROGP 1 UR AG: L. pneumophila Serogp 1 Ur Ag: NEGATIVE

## 2018-11-29 NOTE — Progress Notes (Signed)
Patient and caregivers given discharge, follow up, and medication instructions, verbalized understanding, IV and telemetry monitor removed, personal belongings with patient, caregiver to transport home

## 2018-11-29 NOTE — Care Management Note (Signed)
Case Management Note  Patient Details  Name: ELMIN REININGER MRN: [id number] Date of Birth: [birth year]  Subjective/Objective: HCAP.From home-has Landlord-he defers to Ms Von(Landlord) TC Ms. Von [phone]-she will contact another tenant to pick patient up from hospital if before 6p otherwise she will pick him up. [employer] rep Alvino Chapel aware of HHC-HHRN/PT/OT/aide/CSW. [facility] dme rep will deliver travel tank to rm prior d/c.                   Action/Plan:d/c HHC.   Expected Discharge Date:  (unknown)               Expected Discharge Plan:  Home w Home Health Services  In-House Referral:     Discharge planning Services  CM Consult  Post Acute Care Choice:  Durable Medical Equipment([facility]-home oxygen;[facility]) Choice offered to:     DME Arranged:    DME Agency:     HH Arranged:    HH Agency:     Status of Service:  Completed, signed off  If discussed at [employer] of Stay Meetings, dates discussed:    Additional Comments:  Lanier Clam, RN 11/29/2018, 10:38 AM

## 2018-11-29 NOTE — Discharge Summary (Signed)
Physician Discharge Summary  [name]:[id number] DOB: [birth year] DOA: 11/27/2018  PCP: Patient, No Pcp Per  Admit date: 11/27/2018 Discharge date: 11/29/2018  Admitted From: Home Disposition: Home  Recommendations for Outpatient Follow-up:  1. Follow up with PCP in 1-2 weeks 2. Please obtain BMP/CBC in one week your next doctors visit.  3. Advised to remain compliant with his medications 4. Counseled to quit using illicit drugs 5. Follow-up outpatient with psychiatry  Home Health: Arrangements made Equipment/Devices: Oxygen Discharge Condition: Stable CODE STATUS: Full Diet recommendation: 2 g salt  Brief/Interim Summary: 43 year old with history of COPD, essential hypertension, illicit drug use including cocaine, tobacco use, recent diagnosis of pulmonary embolism on Xarelto, schizoaffective disorder, chronic back pain came to the hospital with complains of shortness of breath and abdominal discomfort.  In the ER initially was noted to be mildly tachypneic with hypoxia saturating 88% on room air.  CT of the abdomen pelvis was consistent with small pleural effusion concerning for possible pneumonia otherwise no intra-abdominal or pelvic pathology noted.  Influenza and UA was negative.  Initially was started on age Treatment but his procalcitonin was negative therefore he was stopped.  He was given bronchodilators which improved his symptoms.  Today he feels much better and wishes to go home therefore we will discharge him in stable condition.   Discharge Diagnoses:  Principal Problem:   HCAP (healthcare-associated pneumonia) Active Problems:   Cocaine abuse (HCC)   Schizoaffective disorder (HCC)   Pulmonary embolism (HCC)   Acute respiratory failure with hypoxia (HCC)   COPD exacerbation (HCC)   Hypertension   Sepsis (HCC)  Acute on chronic hypoxia, 2-3 L nasal cannula Acute mild exacerbation of COPD Recent diagnosis of bilateral pulmonary embolism -Procalcitonin level  negative.  No obvious signs of infection.  Will discontinue antibiotics.  Advised to continue using bronchodilators at home.  Incentive spirometry and flutter valve. -Explained to him the importance to remain compliant with his Xarelto daily.  He understands this.  Right flank pain, resolved -UA is negative.  CT of the abdomen pelvis is negative.  History of cocaine use -Counseled to quit using drugs.  Schizoaffective disorder -Stable.  Resume home medications.  Needs to follow-up outpatient with psychiatry.  Essential hypertension -Resume home meds  Consultations:  None  Subjective: Patient feels much better, he wishes to go home at this time.  Discharge Exam: Vitals:   11/29/18 0656 11/29/18 0724  BP: 112/73   Pulse: 78   Resp: (!) 24   Temp: 98 F (36.7 C)   SpO2: 92% 93%   Vitals:   11/28/18 1933 11/28/18 2125 11/29/18 0656 11/29/18 0724  BP:  105/61 112/73   Pulse:  (!) 102 78   Resp:  (!) 24 (!) 24   Temp:  98.1 F (36.7 C) 98 F (36.7 C)   TempSrc:  Oral Oral   SpO2: 93% 92% 92% 93%  Weight:      Height:        General: Pt is alert, awake, not in acute distress, on 2 L nasal cannula Cardiovascular: RRR, S1/S2 +, no rubs, no gallops Respiratory: Slightly diminished breath sounds at the bases otherwise clear to auscultation bilaterally Abdominal: Soft, NT, ND, bowel sounds + Extremities: no edema, no cyanosis  Discharge Instructions   Allergies as of 11/29/2018      Reactions   Haldol [haloperidol Decanoate] Other (See Comments)   'I curl up"      Medication List    TAKE these  medications   albuterol 108 (90 Base) MCG/ACT inhaler Commonly known as:  PROVENTIL HFA;VENTOLIN HFA Inhale 2 puffs into the lungs every 6 (six) hours as needed for wheezing or shortness of breath.   BREO ELLIPTA 200-25 MCG/INH Aepb Generic drug:  fluticasone furoate-vilanterol Inhale 1 puff into the lungs daily.   furosemide 40 MG tablet Commonly known as:   LASIX Take 1 tablet (40 mg total) by mouth daily.   gabapentin 800 MG tablet Commonly known as:  NEURONTIN Take 1 tablet (800 mg total) by mouth 3 (three) times daily. Notes to patient:  duplicate   gabapentin 400 MG capsule Commonly known as:  NEURONTIN Take 1,200 mg by mouth 4 (four) times daily.   hydrochlorothiazide 12.5 MG capsule Commonly known as:  MICROZIDE Take 12.5 mg by mouth daily.   INVEGA SUSTENNA 156 MG/ML Susy injection Generic drug:  paliperidone Inject 156 mg into the muscle every 30 (thirty) days. On the 4th of each month   metoprolol succinate 25 MG 24 hr tablet Commonly known as:  TOPROL-XL Take 25 mg by mouth daily.   OLANZapine 20 MG tablet Commonly known as:  ZYPREXA Take 1 tablet (20 mg total) by mouth at bedtime.   predniSONE 10 MG tablet Commonly known as:  DELTASONE Prednisone 40 mg po daily x 1 day then Prednisone 30 mg po daily x 1 day then Prednisone 20 mg po daily x 1 day then Prednisone 10 mg daily x 1 day then stop...   Rivaroxaban 15 MG Tabs tablet Commonly known as:  XARELTO Take 1 tablet (15 mg total) by mouth 2 (two) times daily with a meal for 14 days.   rivaroxaban 20 MG Tabs tablet Commonly known as:  XARELTO Take 1 tablet (20 mg total) by mouth daily with supper. Start taking on:  December 05, 2018   senna 8.6 MG Tabs tablet Commonly known as:  SENOKOT Take 1 tablet (8.6 mg total) by mouth 2 (two) times daily as needed for up to 10 days for mild constipation.      Follow-up Information    [facility] - Dme Follow up.   Why:  home oxygen       [facility] Follow up.   Specialty:  Home Health Services [facility]:  [facility]ational [facility]/aide/social worker Contact information: [address] [place] [address] [phone]          Allergies  Allergen Reactions  . Haldol [Haloperidol Decanoate] Other (See Comments)    'I curl up"    You were cared  for by a hospitalist during your hospital stay. If you have any questions about your discharge medications or the care you received while you were in the hospital after you are discharged, you can call the unit and asked to speak with the hospitalist on call if the hospitalist that took care of you is not available. Once you are discharged, your primary care physician will handle any further medical issues. Please note that no refills for any discharge medications will be authorized once you are discharged, as it is imperative that you return to your primary care physician (or establish a relationship with a primary care physician if you do not have one) for your aftercare needs so that they can reassess your need for medications and monitor your lab values.   Procedures/Studies: Dg Chest 2 View  Result Date: 11/18/2018 CLINICAL DATA:  Congestive heart failure. EXAM: CHEST - 2 VIEW  COMPARISON:  Chest x-ray dated November 15, 2018. FINDINGS: Stable cardiomegaly. Improved pulmonary edema. No large pleural effusion or pneumothorax. Low lung volumes. No acute osseous abnormality. IMPRESSION: 1. Improved pulmonary edema. Electronically Signed   By: Obie Dredge M.D.   On: 11/18/2018 13:16   Dg Chest 2 View  Result Date: 11/15/2018 CLINICAL DATA:  Hypoxia, smoker, COPD EXAM: CHEST - 2 VIEW COMPARISON:  11/13/2018 FINDINGS: Enlargement of cardiac silhouette with pulmonary vascular congestion. Mediastinal contour stable. BILATERAL airspace infiltrates again seen favor pulmonary edema, probably not significantly changed when accounting for differences in technique. No pneumothorax. Question minimal RIGHT pleural effusion. IMPRESSION: Enlargement of cardiac silhouette with pulmonary vascular congestion and persistent pulmonary infiltrates favoring pulmonary edema. Electronically Signed   By: Ulyses Southward M.D.   On: 11/15/2018 11:23   Dg Chest 2 View  Result Date: 11/13/2018 CLINICAL DATA:  Initial evaluation  for acute chest pain. EXAM: CHEST - 2 VIEW COMPARISON:  Prior radiograph from 11/01/2018 FINDINGS: Cardiomegaly, stable.  Mediastinal silhouette normal. Lungs hypoinflated. Diffuse vascular congestion with interstitial prominence, suggesting mild diffuse pulmonary interstitial edema. No definite pleural effusion. No consolidative opacity. No pneumothorax. No acute osseous finding. IMPRESSION: Cardiomegaly with mild diffuse pulmonary interstitial edema, suggesting CHF. Electronically Signed   By: Rise Mu M.D.   On: 11/13/2018 02:38   Dg Chest 2 View  Result Date: 11/01/2018 CLINICAL DATA:  Amnesia for 2 days since doing crack. Confusion. EXAM: CHEST - 2 VIEW COMPARISON:  10/28/2018 FINDINGS: Cardiac enlargement. No vascular congestion. No airspace disease or consolidation in the lungs. No blunting of costophrenic angles. No pneumothorax. Mediastinal contours appear intact. IMPRESSION: Cardiac enlargement. No evidence of active pulmonary disease. Electronically Signed   By: Burman Nieves M.D.   On: 11/01/2018 19:12   Ct Angio Chest Pe W And/or Wo Contrast  Result Date: 11/13/2018 CLINICAL DATA:  Shortness of breath and chest pain.  Cocaine use EXAM: CT ANGIOGRAPHY CHEST WITH CONTRAST TECHNIQUE: Multidetector CT imaging of the chest was performed using the standard protocol during bolus administration of intravenous contrast. Multiplanar CT image reconstructions and MIPs were obtained to evaluate the vascular anatomy. CONTRAST:  ISOVUE-370 IOPAMIDOL (ISOVUE-370) INJECTION 76% COMPARISON:  10/22/2018 FINDINGS: Cardiovascular: Positive for acute pulmonary emboli. Occlusive filling defect seen in the posterior basal segment right lower lobe. Branching central defect seen at the interlobar pulmonary artery extending into right lower basal and medial segment right middle lobe segments. Left upper lobe clot is seen anteriorly and at the lingula. RV to LV ratio is 0.86. Heart morphology is stable  from prior. No pericardial effusion. Negative aorta. Mediastinum/Nodes: Negative for adenopathy Lungs/Pleura: Wedge of ground-glass density in the subpleural right middle lobe and right lower lobe consistent with infarcts. There is also atelectasis. No pulmonary edema. Upper Abdomen: Negative Musculoskeletal: No acute finding Critical Value/emergent results were called by telephone at the time of interpretation on 11/13/2018 at 5:11 am to Dr. Derwood Kaplan , who verbally acknowledged these results. Review of the MIP images confirms the above findings. IMPRESSION: Multifocal acute pulmonary embolism with infarcts in the right middle and right lower lobes. Electronically Signed   By: Marnee Spring M.D.   On: 11/13/2018 05:17   Ct Abdomen Pelvis W Contrast  Result Date: 11/27/2018 CLINICAL DATA:  Abdomen and back pain EXAM: CT ABDOMEN AND PELVIS WITH CONTRAST TECHNIQUE: Multidetector CT imaging of the abdomen and pelvis was performed using the standard protocol following bolus administration of intravenous contrast. CONTRAST:  ISOVUE-300 IOPAMIDOL (ISOVUE-300)  INJECTION 61% COMPARISON:  CT 01/31/2016 FINDINGS: Lower chest: Lung bases demonstrate small bilate right pleural effusion. Patchy consolidation and adjacent ground-glass density in the posterior right lower lobe suspicious for pneumonia. Heart size within normal limits. Hepatobiliary: Small calcified stones in the gallbladder. No biliary dilatation or focal hepatic abnormality Pancreas: Unremarkable. No pancreatic ductal dilatation or surrounding inflammatory changes. Spleen: Normal in size without focal abnormality. Adrenals/Urinary Tract: Adrenal glands are unremarkable. Kidneys are normal, without renal calculi, focal lesion, or hydronephrosis. Bladder is unremarkable. Stomach/Bowel: Stomach is within normal limits. Appendix appears normal. No evidence of bowel wall thickening, distention, or inflammatory changes. Vascular/Lymphatic: No significant  vascular findings are present. No enlarged abdominal or pelvic lymph nodes. Reproductive: Prostate is unremarkable. Other: Negative for free air or free fluid. Musculoskeletal: Probable L4 vertebral hemangioma. No acute or suspicious abnormality IMPRESSION: 1. Small right-sided pleural effusion with partial consolidation in the posterior right lower lobe suspicious for a pneumonia. 2. No CT evidence for acute intra-abdominal or pelvic abnormality. 3. Probable gallstones Electronically Signed   By: Jasmine Pang M.D.   On: 11/27/2018 23:33   Dg Chest Portable 1 View  Result Date: 11/27/2018 CLINICAL DATA:  Abdominal pain and right flank pain EXAM: PORTABLE CHEST 1 VIEW COMPARISON:  11/18/2018 FINDINGS: Shallow lung inflation with mild cardiomegaly. No pulmonary edema or focal airspace consolidation. No pleural effusion. IMPRESSION: Shallow lung inflation without acute airspace disease. Electronically Signed   By: Deatra Robinson M.D.   On: 11/27/2018 22:29   Vas [place] Lower Extremity Venous (dvt)  Result Date: 11/14/2018  Lower Venous Study Indications: Pain, and pulmonary embolism.  Performing Technologist: Gertie Fey MHA, RDMS, RVT, RDCS  Examination Guidelines: A complete evaluation includes B-mode imaging, spectral Doppler, color Doppler, and power Doppler as needed of all accessible portions of each vessel. Bilateral testing is considered an integral part of a complete examination. Limited examinations for reoccurring indications may be performed as noted.  Right Venous Findings: +---------+---------------+---------+-----------+----------+-------+          CompressibilityPhasicitySpontaneityPropertiesSummary +---------+---------------+---------+-----------+----------+-------+ CFV      Full           Yes      Yes                          +---------+---------------+---------+-----------+----------+-------+ SFJ      Full                                                  +---------+---------------+---------+-----------+----------+-------+ FV Prox  Full                                                 +---------+---------------+---------+-----------+----------+-------+ FV Mid   Full                                                 +---------+---------------+---------+-----------+----------+-------+ FV DistalFull                                                 +---------+---------------+---------+-----------+----------+-------+  PFV      Full                                                 +---------+---------------+---------+-----------+----------+-------+ POP      Full           Yes      Yes                          +---------+---------------+---------+-----------+----------+-------+ PTV      Full                                                 +---------+---------------+---------+-----------+----------+-------+ PERO     Full                                                 +---------+---------------+---------+-----------+----------+-------+  Left Venous Findings: +---------+---------------+---------+-----------+----------+-------+          CompressibilityPhasicitySpontaneityPropertiesSummary +---------+---------------+---------+-----------+----------+-------+ CFV      Full           Yes      Yes                          +---------+---------------+---------+-----------+----------+-------+ SFJ      Full                                                 +---------+---------------+---------+-----------+----------+-------+ FV Prox  Full                                                 +---------+---------------+---------+-----------+----------+-------+ FV Mid   Full                                                 +---------+---------------+---------+-----------+----------+-------+ FV DistalFull                                                  +---------+---------------+---------+-----------+----------+-------+ PFV      Full                                                 +---------+---------------+---------+-----------+----------+-------+ POP      Full           Yes      Yes                          +---------+---------------+---------+-----------+----------+-------+  PTV      Full                                                 +---------+---------------+---------+-----------+----------+-------+ PERO     None                    No                   Acute   +---------+---------------+---------+-----------+----------+-------+    Summary: Right: There is no evidence of deep vein thrombosis in the lower extremity. No cystic structure found in the popliteal fossa. Left: Findings consistent with acute deep vein thrombosis involving the left peroneal vein. No cystic structure found in the popliteal fossa.  *See table(s) above for measurements and observations. Electronically signed by Waverly Ferrari MD on 11/14/2018 at 2:46:45 PM.    Final       The results of significant diagnostics from this hospitalization (including imaging, microbiology, ancillary and laboratory) are listed below for reference.     Microbiology: Recent Results (from the past 240 hour(s))  Culture, blood (x 2)     Status: None (Preliminary result)   Collection Time: 11/28/18  5:19 AM  Result Value Ref Range Status   Specimen Description   Final    BLOOD RIGHT ANTECUBITAL Performed at [facility], [address] [place], [place] [address]    Special Requests   Final    BOTTLES DRAWN AEROBIC AND ANAEROBIC Blood Culture adequate volume Performed at [facility], [address] [place], [place] [address]    Culture   Final    NO GROWTH 1 DAY Performed at [facility], [address] [place], [place] [address]    Report Status PENDING  Incomplete  Culture, blood (x 2)     Status: None  (Preliminary result)   Collection Time: 11/28/18  4:14 PM  Result Value Ref Range Status   Specimen Description   Final    BLOOD LEFT ARM Performed at [facility], [address] [place], [place] [address]    Special Requests   Final    BOTTLES DRAWN AEROBIC ONLY Blood Culture adequate volume   Culture   Final    NO GROWTH < 24 HOURS Performed at [facility], [address], [place], [place] [address]    Report Status PENDING  Incomplete     Labs: BNP (last 3 results) Recent Labs    11/01/18 1828 11/13/18 0147 11/28/18 0518  BNP 12.8 34.1 16.8   Basic Metabolic Panel: Recent Labs  Lab 11/27/18 2156 11/29/18 0556  NA 139 139  K 4.0 4.0  CL 96* 99  CO2 34* 35*  GLUCOSE 111* 126*  BUN 10 11  CREATININE 0.78 0.78  CALCIUM 9.3 8.6*   Liver Function Tests: Recent Labs  Lab 11/27/18 2156  AST 16  ALT 32  ALKPHOS 83  BILITOT 0.6  PROT 7.1  ALBUMIN 3.5   Recent Labs  Lab 11/27/18 2156  LIPASE 34   No results for input(s): AMMONIA in the last 168 hours. CBC: Recent Labs  Lab 11/27/18 2156 11/29/18 0556  WBC 14.1* 9.9  NEUTROABS 9.7*  --   HGB 14.7 12.8*  HCT 46.2 41.1  MCV 99.8 102.2*  PLT 244 205   Cardiac Enzymes:  No results for input(s): CKTOTAL, CKMB, CKMBINDEX, TROPONINI in the last 168 hours. BNP: Invalid input(s): POCBNP CBG: No results for input(s): GLUCAP in the last 168 hours. D-Dimer No results for input(s): DDIMER in the last 72 hours. Hgb A1c No results for input(s): HGBA1C in the last 72 hours. Lipid Profile No results for input(s): CHOL, HDL, LDLCALC, TRIG, CHOLHDL, LDLDIRECT in the last 72 hours. Thyroid function studies No results for input(s): TSH, T4TOTAL, T3FREE, THYROIDAB in the last 72 hours.  Invalid input(s): FREET3 Anemia work up No results for input(s): VITAMINB12, FOLATE, FERRITIN, TIBC, IRON, RETICCTPCT in the last 72 hours. Urinalysis    Component Value Date/Time   COLORURINE YELLOW  11/27/2018 2213   APPEARANCEUR CLOUDY (A) 11/27/2018 2213   LABSPEC 1.011 11/27/2018 2213   PHURINE 8.0 11/27/2018 2213   GLUCOSEU NEGATIVE 11/27/2018 2213   HGBUR NEGATIVE 11/27/2018 2213   BILIRUBINUR NEGATIVE 11/27/2018 2213   KETONESUR NEGATIVE 11/27/2018 2213   PROTEINUR NEGATIVE 11/27/2018 2213   UROBILINOGEN 1.0 08/02/2015 1725   NITRITE NEGATIVE 11/27/2018 2213   LEUKOCYTESUR NEGATIVE 11/27/2018 2213   Sepsis Labs Invalid input(s): PROCALCITONIN,  WBC,  LACTICIDVEN Microbiology Recent Results (from the past 240 hour(s))  Culture, blood (x 2)     Status: None (Preliminary result)   Collection Time: 11/28/18  5:19 AM  Result Value Ref Range Status   Specimen Description   Final    BLOOD RIGHT ANTECUBITAL Performed at [facility], [address], [place], [place] [address]    Special Requests   Final    BOTTLES DRAWN AEROBIC AND ANAEROBIC Blood Culture adequate volume Performed at [facility], [address] [place], [place] [address]    Culture   Final    NO GROWTH 1 DAY Performed at [facility], [address], [place], [place] [address]    Report Status PENDING  Incomplete  Culture, blood (x 2)     Status: None (Preliminary result)   Collection Time: 11/28/18  4:14 PM  Result Value Ref Range Status   Specimen Description   Final    BLOOD LEFT ARM Performed at [facility], [address], [place], [place] [address]    Special Requests   Final    BOTTLES DRAWN AEROBIC ONLY Blood Culture adequate volume   Culture   Final    NO GROWTH < 24 HOURS Performed at [facility], [address], [place], [place] [address]    Report Status PENDING  Incomplete     Time coordinating discharge:  I have spent 35 minutes face to face with the patient and on the ward discussing the patients care, assessment, plan and disposition with other care givers. >50% of the time was devoted counseling the patient  about the risks and benefits of treatment/Discharge disposition and coordinating care.   SIGNED:   Dimple Nanas, MD  [facility] 11/29/2018, 1:40 PM   If 7PM-7AM, please contact night-coverage [email or web address]

## 2018-11-29 NOTE — Plan of Care (Signed)
Pt had a good appetite this shift. Pt's mood and behavior are unpredictable. Pt becomes agitated easily.

## 2018-11-30 NOTE — Progress Notes (Signed)
Transitions of Care Follow Up Call Note  [name] is an 43 y.o. male who presented to [facility] on 11/13/2018.  The patient had the following prescriptions filled at [facility]: Prednisone, Senna, Xarelto, olanzapine, gabapentin, furosemide, Proair   Patient's caregiver, [name], answered the phone. HIPAA identifiers were verified. The following questions were asked about the prescriptions filled at [facility]:   Has the patient been experiencing any side effects to the medications prescribed? no Understanding of regimen: good Understanding of indications: good Potential of compliance: good  Pharmacist comments: Counseled on the importance of adherence and possible side effects of Xarelto.    [x]  Patient's prescriptions filled at the [facility] were transferred to the following pharmacy: [facility]  []  Patient unable to be reached after calling three times and prescriptions filled at the [facility] were transferred to preferred pharmacy found within their chart.   ANNA K LOVE 11/30/2018, 6:37 PM Transitions of Care Pharmacy Hours: Monday - Friday 8:30am to 5:00 PM  Phone - [phone]

## 2018-12-03 LAB — CULTURE, BLOOD (ROUTINE X 2)
Culture: NO GROWTH
Culture: NO GROWTH
Special Requests: ADEQUATE
Special Requests: ADEQUATE

## 2018-12-05 ENCOUNTER — Ambulatory Visit (INDEPENDENT_AMBULATORY_CARE_PROVIDER_SITE_OTHER): Payer: Medicare Other | Admitting: Pulmonary Disease

## 2018-12-05 ENCOUNTER — Emergency Department (HOSPITAL_BASED_OUTPATIENT_CLINIC_OR_DEPARTMENT_OTHER): Payer: Medicare Other

## 2018-12-05 ENCOUNTER — Encounter (HOSPITAL_COMMUNITY): Payer: Self-pay | Admitting: Emergency Medicine

## 2018-12-05 ENCOUNTER — Other Ambulatory Visit: Payer: Self-pay

## 2018-12-05 ENCOUNTER — Emergency Department (HOSPITAL_COMMUNITY): Payer: Medicare Other

## 2018-12-05 ENCOUNTER — Encounter: Payer: Self-pay | Admitting: Pulmonary Disease

## 2018-12-05 ENCOUNTER — Emergency Department (HOSPITAL_COMMUNITY)
Admission: EM | Admit: 2018-12-05 | Discharge: 2018-12-05 | Disposition: A | Discharge status: Home / Self Care | Payer: Medicare Other | Attending: Emergency Medicine | Admitting: Emergency Medicine

## 2018-12-05 VITALS — BP 122/78 | HR 108 | Ht 70.0 in | Wt 326.0 lb

## 2018-12-05 DIAGNOSIS — F1410 Cocaine abuse, uncomplicated: Secondary | ICD-10-CM | POA: Insufficient documentation

## 2018-12-05 DIAGNOSIS — J449 Chronic obstructive pulmonary disease, unspecified: Secondary | ICD-10-CM | POA: Insufficient documentation

## 2018-12-05 DIAGNOSIS — Z7901 Long term (current) use of anticoagulants: Secondary | ICD-10-CM | POA: Diagnosis not present

## 2018-12-05 DIAGNOSIS — R2243 Localized swelling, mass and lump, lower limb, bilateral: Secondary | ICD-10-CM | POA: Insufficient documentation

## 2018-12-05 DIAGNOSIS — R0602 Shortness of breath: Secondary | ICD-10-CM

## 2018-12-05 DIAGNOSIS — R531 Weakness: Secondary | ICD-10-CM | POA: Diagnosis not present

## 2018-12-05 DIAGNOSIS — M7989 Other specified soft tissue disorders: Secondary | ICD-10-CM | POA: Diagnosis not present

## 2018-12-05 DIAGNOSIS — I10 Essential (primary) hypertension: Secondary | ICD-10-CM | POA: Diagnosis not present

## 2018-12-05 DIAGNOSIS — M79604 Pain in right leg: Secondary | ICD-10-CM | POA: Diagnosis not present

## 2018-12-05 DIAGNOSIS — R079 Chest pain, unspecified: Secondary | ICD-10-CM | POA: Diagnosis not present

## 2018-12-05 DIAGNOSIS — F17210 Nicotine dependence, cigarettes, uncomplicated: Secondary | ICD-10-CM | POA: Insufficient documentation

## 2018-12-05 DIAGNOSIS — Z79899 Other long term (current) drug therapy: Secondary | ICD-10-CM | POA: Diagnosis not present

## 2018-12-05 DIAGNOSIS — M79605 Pain in left leg: Secondary | ICD-10-CM | POA: Insufficient documentation

## 2018-12-05 DIAGNOSIS — R0789 Other chest pain: Secondary | ICD-10-CM | POA: Insufficient documentation

## 2018-12-05 DIAGNOSIS — M79661 Pain in right lower leg: Secondary | ICD-10-CM | POA: Diagnosis not present

## 2018-12-05 DIAGNOSIS — G4733 Obstructive sleep apnea (adult) (pediatric): Secondary | ICD-10-CM

## 2018-12-05 DIAGNOSIS — M79662 Pain in left lower leg: Secondary | ICD-10-CM | POA: Diagnosis not present

## 2018-12-05 DIAGNOSIS — R609 Edema, unspecified: Secondary | ICD-10-CM | POA: Diagnosis not present

## 2018-12-05 DIAGNOSIS — R52 Pain, unspecified: Secondary | ICD-10-CM | POA: Diagnosis not present

## 2018-12-05 LAB — CBC WITH DIFFERENTIAL/PLATELET
Abs Immature Granulocytes: 0.02 10*3/uL (ref 0.00–0.07)
Basophils Absolute: 0.1 10*3/uL (ref 0.0–0.1)
Basophils Relative: 1 %
Eosinophils Absolute: 0.3 10*3/uL (ref 0.0–0.5)
Eosinophils Relative: 5 %
HCT: 42.2 % (ref 39.0–52.0)
Hemoglobin: 13.4 g/dL (ref 13.0–17.0)
Immature Granulocytes: 0 %
Lymphocytes Relative: 28 %
Lymphs Abs: 1.9 10*3/uL (ref 0.7–4.0)
MCH: 30.8 pg (ref 26.0–34.0)
MCHC: 31.8 g/dL (ref 30.0–36.0)
MCV: 97 fL (ref 80.0–100.0)
Monocytes Absolute: 0.3 10*3/uL (ref 0.1–1.0)
Monocytes Relative: 5 %
NEUTROS PCT: 61 %
Neutro Abs: 4.2 10*3/uL (ref 1.7–7.7)
Platelets: 199 10*3/uL (ref 150–400)
RBC: 4.35 MIL/uL (ref 4.22–5.81)
RDW: 13.1 % (ref 11.5–15.5)
WBC: 6.7 10*3/uL (ref 4.0–10.5)
nRBC: 0 % (ref 0.0–0.2)

## 2018-12-05 LAB — COMPREHENSIVE METABOLIC PANEL
ALT: 18 U/L (ref 0–44)
AST: 14 U/L — ABNORMAL LOW (ref 15–41)
Albumin: 3.2 g/dL — ABNORMAL LOW (ref 3.5–5.0)
Alkaline Phosphatase: 78 U/L (ref 38–126)
Anion gap: 12 (ref 5–15)
BUN: 8 mg/dL (ref 6–20)
CO2: 32 mmol/L (ref 22–32)
Calcium: 8.9 mg/dL (ref 8.9–10.3)
Chloride: 97 mmol/L — ABNORMAL LOW (ref 98–111)
Creatinine, Ser: 0.83 mg/dL (ref 0.61–1.24)
GFR calc Af Amer: >60 mL/min (ref >60)
GFR calc non Af Amer: >60 mL/min (ref >60)
Glucose, Bld: 98 mg/dL (ref 70–99)
POTASSIUM: 3.1 mmol/L — AB (ref 3.5–5.1)
Sodium: 141 mmol/L (ref 135–145)
Total Bilirubin: 0.7 mg/dL (ref 0.3–1.2)
Total Protein: 6.1 g/dL — ABNORMAL LOW (ref 6.5–8.1)

## 2018-12-05 LAB — URINALYSIS, ROUTINE W REFLEX MICROSCOPIC
Bilirubin Urine: NEGATIVE
Glucose, UA: NEGATIVE mg/dL
HGB URINE DIPSTICK: NEGATIVE
Ketones, ur: NEGATIVE mg/dL
Leukocytes,Ua: NEGATIVE
Nitrite: NEGATIVE
Protein, ur: NEGATIVE mg/dL
Specific Gravity, Urine: 1.009 (ref 1.005–1.030)
pH: 9 — ABNORMAL HIGH (ref 5.0–8.0)

## 2018-12-05 LAB — I-STAT TROPONIN, ED: Troponin i, poc: 0.01 ng/mL (ref 0.00–0.08)

## 2018-12-05 LAB — BRAIN NATRIURETIC PEPTIDE: B Natriuretic Peptide: 10.7 pg/mL (ref 0.0–100.0)

## 2018-12-05 LAB — RAPID URINE DRUG SCREEN, HOSP PERFORMED
Amphetamines: NOT DETECTED
BENZODIAZEPINES: NOT DETECTED
Barbiturates: NOT DETECTED
Cocaine: NOT DETECTED
Opiates: NOT DETECTED
Tetrahydrocannabinol: NOT DETECTED

## 2018-12-05 MED ORDER — IOHEXOL 350 MG/ML SOLN
100.0000 mL | Freq: Once | INTRAVENOUS | Status: AC | PRN
  Administered 2018-12-05: 58 mL via INTRAVENOUS

## 2018-12-05 NOTE — Discharge Instructions (Addendum)
You have been seen today for chest pain, shortness of breath, and pain in legs. Please read and follow all provided instructions.   1. Medications: usual home medications 2. Treatment: rest, drink plenty of fluids 3. Follow Up: Please follow up with your primary doctor in 2 days for discussion of your diagnoses and further evaluation after today's visit; if you do not have a primary care doctor use the resource guide provided to find one; Please return to the ER for any new or worsening symptoms. Please obtain all of your results from medical records or have your doctors office obtain the results - share them with your doctor - you should be seen at your doctors office. Call today to arrange your follow up.   Take medications as prescribed. Please review all of the medicines and only take them if you do not have an allergy to them. Return to the emergency room for worsening condition or new concerning symptoms. Follow up with your regular doctor. If you don't have a regular doctor use one of the numbers below to establish a primary care doctor.  It is also a possibility that you have an allergic reaction to any of the medicines that you have been prescribed - Everybody reacts differently to medications and while MOST people have no trouble with most medicines, you may have a reaction such as nausea, vomiting, rash, swelling, shortness of breath. If this is the case, please stop taking the medicine immediately and contact your physician.  ?  You should return to the ER if you develop severe or worsening symptoms.   Emergency Department Resource Guide 1) Find a Doctor and Pay Out of Pocket Although you won't have to find out who is covered by your insurance plan, it is a good idea to ask around and get recommendations. You will then need to call the office and see if the doctor you have chosen will accept you as a new patient and what types of options they offer for patients who are self-pay. Some  doctors offer discounts or will set up payment plans for their patients who do not have insurance, but you will need to ask so you aren't surprised when you get to your appointment.  2) Contact Your Local Health Department Not all health departments have doctors that can see patients for sick visits, but many do, so it is worth a call to see if yours does. If you don't know where your local health department is, you can check in your phone book. The CDC also has a tool to help you locate your state's health department, and many state websites also have listings of all of their local health departments.  3) Find a Walk-in Clinic If your illness is not likely to be very severe or complicated, you may want to try a walk in clinic. These are popping up all over the country in pharmacies, drugstores, and shopping centers. They're usually staffed by nurse practitioners or physician assistants that have been trained to treat common illnesses and complaints. They're usually fairly quick and inexpensive. However, if you have serious medical issues or chronic medical problems, these are probably not your best option.  No Primary Care Doctor: Call [facility] at  [phone] - they can help you locate a primary care doctor that  accepts your insurance, provides certain services, etc. Physician Referral Service- [phone]  Emergency Department Resource Guide 1) Find a Doctor and Pay Out of Pocket Although you won't have to find out  who is covered by your insurance plan, it is a good idea to ask around and get recommendations. You will then need to call the office and see if the doctor you have chosen will accept you as a new patient and what types of options they offer for patients who are self-pay. Some doctors offer discounts or will set up payment plans for their patients who do not have insurance, but you will need to ask so you aren't surprised when you get to your appointment.  2) Contact Your Local  Health Department Not all health departments have doctors that can see patients for sick visits, but many do, so it is worth a call to see if yours does. If you don't know where your local health department is, you can check in your phone book. The CDC also has a tool to help you locate your state's health department, and many state websites also have listings of all of their local health departments.  3) Find a Walk-in Clinic If your illness is not likely to be very severe or complicated, you may want to try a walk in clinic. These are popping up all over the country in pharmacies, drugstores, and shopping centers. They're usually staffed by nurse practitioners or physician assistants that have been trained to treat common illnesses and complaints. They're usually fairly quick and inexpensive. However, if you have serious medical issues or chronic medical problems, these are probably not your best option.  No Primary Care Doctor: Call [facility] at  [phone] - they can help you locate a primary care doctor that  accepts your insurance, provides certain services, etc. Physician Referral Service- [phone]  Chronic Pain Problems: Organization         Address  Phone   Notes  [facility]  [phone] Patients need to be referred by their primary care doctor.   Medication Assistance: Organization         Address  Phone   Notes  [facility] [address] [place] [place], [place] [address] [phone] --Must be a resident of [facility] -- Must have NO insurance coverage whatsoever (no Medicaid/ Medicare, etc.) -- The pt. MUST have a primary care doctor that directs their care regularly and follows them in the community   [facility]  [phone]   [employer]  [phone]    Agencies that provide inexpensive medical care: Organization         Address  Phone   Notes  [facility]  [phone]   [facility]    [phone]   [facility] [address] [place], [place] [address] [phone]   [facility] [address] [place] [phone]   [facility]anned Parenthood    [phone]   [facility]    [phone]   [facility]  [address], [place] Phone:  [phone], Fax:  [phone] Hours of Operation:  9 am - 6 pm, M-F.  Also accepts Medicaid/Medicare and self-pay.  [facility]  [address], [place] Phone: [phone], Fax: [phone]. Hours of Operation:  8:30 am - 5:30 pm, M-F.  Also accepts Medicaid and self-pay.  [facility] [place] [address], [place] Phone: [phone]   [facility] [address] [place], [place] [phone], Ext.  123 Mondays & Thursdays: 7-9 AM.  First 15 patients are seen on a first come, first serve basis.    Medicaid-accepting [facility] Providers:  Organization         Address  Phone   Notes  [facility] [address], [place] [phone] Also accepts self-pay patients.  [facility] [address] Laurell Josephs [place], [place]  [phone]   [facility] [address], [place] [phone]   [facility] [address], [place] [phone]   Renaye Rakers [address], [place]   [phone] Only accepts [place] patients after they have their name applied to their card.   Self-Pay (no insurance) in [facility]:  Organization         Address  Phone   Notes  Sickle Cell Patients, [facility] [address] [place], [place] [phone]   [facility] [address] [place], [place] [phone]   [facility] [place]  [address],  [place] [phone]   [facility]/Dr. Osei-Bonsu  [address] [place] or [address], [place] [phone] Phone number for both [place] and [place] locations is the same.  [facility] [address] [place] [phone]   [facility] [address] [place] or [address] [phone]   [facility] [address], [place] [phone], phone; [phone], fax Sees patients 1st and 3rd Saturday of every month.  Must not qualify for public or private insurance (i.e. Medicaid, Medicare, [employer], Veterans' Benefits)  Household income should be no more than 200% of the poverty level The clinic cannot treat you if you are pregnant or think you are pregnant  Sexually transmitted diseases are not treated at the clinic.

## 2018-12-05 NOTE — ED Provider Notes (Signed)
[facility]   CSN: [id number] Arrival date & time: 12/05/18  1751    History   Chief Complaint No chief complaint on file. Chest pain Leg pain  HPI [name] is a 43 y.o. male with a PMH of PE on Xarelto, COPD, HTN, Schizophrenic disorder, and cocaine use disorder presenting with intermittent central non radiating chest pain onset 1 week ago. Patient arrived via EMS from home. Patient describes pain as throbbing and states nothing makes it better or worse. Patient reports associated constant shortness of breath over the last 3 weeks. Patient reports bilateral leg edema and pain, but states edema/pain are worse on the right leg. Patient reports leg edema for 1 week. Patient denies fever, chills, cough, congestion, rhinorrhea, or sick exposures. Patient denies travel. Patient reports crack cocaine use 1 month ago, but denies any other drug use. Patient reports tobacco, but denies alcohol use. Patient reports generalized weakness, but denies numbness, syncope, or dizziness. Patient denies nausea, vomiting, diarrhea, or abdominal pain. Patient was recently hospitalized for HCAP on 11/27/2018 and discharged on 11/29/2018. Patient was diagnosed with a PE on 11/13/2018 and placed on Xarelto. Patient reports he has been compliant with his medications. Last echo was done on 10/2018 and it reveals 60-65% LVEF.     HPI  Past Medical History:  Diagnosis Date  . Chronic back pain   . Cocaine use disorder (HCC)   . COPD (chronic obstructive pulmonary disease) (HCC)   . History of suicidal ideation   . Hypertension   . PE (pulmonary thromboembolism) (HCC) 10/2018   recurrent  . Schizophrenic disorder [facility](HCC)     Patient Active Problem List   Diagnosis Date Noted  . HCAP (healthcare-associated pneumonia) 11/28/2018  . Sepsis (HCC) 11/28/2018  . Acute respiratory failure with hypoxia (HCC) 11/13/2018  . Pulmonary emboli (HCC) 11/13/2018  .  COPD exacerbation (HCC)   . Hypertension   . Chronic back pain   . Pulmonary embolism (HCC) 12/13/2017  . recurrent Pulmonary embolism, bilateral (HCC)  3/19 and 2/20   . Paranoid schizophrenia (HCC)   . Cocaine abuse with cocaine-induced mood disorder (HCC) 06/30/2017  . Substance induced mood disorder (HCC) 11/29/2015  . Homeless   . Suicidal ideation   . Schizoaffective disorder, unspecified type (HCC)   . Schizoaffective disorder (HCC) 05/08/2013  . Cocaine abuse (HCC) 11/11/2011    History reviewed. No pertinent surgical history.      Home Medications    Prior to Admission medications   Medication Sig Start Date End Date Taking? Authorizing Provider  albuterol (PROVENTIL HFA;VENTOLIN HFA) 108 (90 Base) MCG/ACT inhaler Inhale 2 puffs into the lungs every 6 (six) hours as needed for wheezing or shortness of breath. 11/20/18  Yes Lama, Sarina IllGagan S, MD  BREO ELLIPTA 200-25 MCG/INH AEPB Inhale 1 puff into the lungs daily.  11/25/18  Yes [provider]  gabapentin (NEURONTIN) 400 MG capsule Take 800 mg by mouth 3 (three) times daily.  11/20/18  Yes [provider]  OLANZapine (ZYPREXA) 20 MG tablet Take 1 tablet (20 mg total) by mouth at bedtime. Patient taking differently: Take 40 mg by mouth at bedtime.  11/20/18  Yes Meredeth IdeLama, Gagan S, MD  paliperidone (INVEGA SUSTENNA) 156 MG/ML SUSY injection Inject 156 mg into the muscle every 30 (thirty) days. On the 4th of each month   Yes [provider]  Rivaroxaban (XARELTO) 15 MG TABS tablet Take 1 tablet (15 mg total) by mouth  2 (two) times daily with a meal for 14 days. 11/20/18 12/05/18 Yes Meredeth Ide, MD  furosemide (LASIX) 40 MG tablet Take 1 tablet (40 mg total) by mouth daily. Patient not taking: Reported on 12/05/2018 11/20/18 11/20/19  Meredeth Ide, MD  gabapentin (NEURONTIN) 800 MG tablet Take 1 tablet (800 mg total) by mouth 3 (three) times daily. Patient not taking: Reported on 12/05/2018 11/20/18   Meredeth Ide,  MD  hydrochlorothiazide (MICROZIDE) 12.5 MG capsule Take 12.5 mg by mouth daily. 11/25/18   [provider]  metoprolol succinate (TOPROL-XL) 25 MG 24 hr tablet Take 25 mg by mouth daily. 11/25/18   [provider]  rivaroxaban (XARELTO) 20 MG TABS tablet Take 1 tablet (20 mg total) by mouth daily with supper. Patient not taking: Reported on 12/05/2018 12/05/18   Meredeth Ide, MD    Family History Family History  Problem Relation Age of Onset  . Cancer Mother   . Heart disease Father   . Drug abuse Brother     Social History Social History   Tobacco Use  . Smoking status: Current Every Day Smoker    Packs/day: 2.50    Years: 15.00    Pack years: 37.50    Types: Cigarettes  . Smokeless tobacco: Never Used  Substance Use Topics  . Alcohol use: No    Comment: seldom  . Drug use: Yes    Types: "Crack" cocaine, Cocaine     Allergies   Haldol [haloperidol decanoate]   Review of Systems Review of Systems  Constitutional: Negative for activity change, appetite change, chills, diaphoresis, fatigue, fever and unexpected weight change.  HENT: Negative for congestion and rhinorrhea.   Eyes: Negative for visual disturbance.  Respiratory: Positive for shortness of breath. Negative for cough, chest tightness and wheezing.   Cardiovascular: Positive for chest pain and leg swelling. Negative for palpitations.  Gastrointestinal: Negative for abdominal pain, nausea and vomiting.  Endocrine: Negative for cold intolerance and heat intolerance.  Genitourinary: Negative for dysuria.  Musculoskeletal: Negative for back pain.  Skin: Negative for rash.  Neurological: Positive for weakness. Negative for dizziness, syncope and light-headedness.  Psychiatric/Behavioral: Negative for agitation and behavioral problems. The patient is not nervous/anxious.     Physical Exam Updated Vital Signs BP 126/73   Pulse 99   Temp (!) 97.5 F (36.4 C) (Oral)   Resp (!) 21   Ht 5\' 10"   (1.778 m)   Wt (!) 147.9 kg   SpO2 94%   BMI 46.78 kg/m   Physical Exam Vitals signs and nursing note reviewed.  Constitutional:      General: He is not in acute distress.    Appearance: He is well-developed. He is not diaphoretic.  HENT:     Head: Normocephalic and atraumatic.     Right Ear: Tympanic membrane, ear canal and external ear normal.     Left Ear: Tympanic membrane, ear canal and external ear normal.     Nose: Nose normal. No congestion or rhinorrhea.     Mouth/Throat:     Mouth: Mucous membranes are moist.     Pharynx: No posterior oropharyngeal erythema.  Eyes:     Extraocular Movements: Extraocular movements intact.     Conjunctiva/sclera: Conjunctivae normal.     Pupils: Pupils are equal, round, and reactive to light.  Neck:     Musculoskeletal: Normal range of motion and neck supple.     Vascular: No JVD.  Cardiovascular:     Rate  and Rhythm: Normal rate and regular rhythm.     Pulses: Normal pulses.          Radial pulses are 2+ on the right side and 2+ on the left side.       Dorsalis pedis pulses are 2+ on the right side and 2+ on the left side.     Heart sounds: Normal heart sounds. No murmur. No friction rub. No gallop.   Pulmonary:     Effort: Pulmonary effort is normal. No respiratory distress.     Breath sounds: Normal breath sounds. No wheezing or rales.  Chest:     Chest wall: Tenderness present.  Abdominal:     General: There is no distension.     Palpations: Abdomen is soft.     Tenderness: There is no abdominal tenderness.  Musculoskeletal: Normal range of motion.        General: Tenderness (Tenderness to palpation of bilateral lower extremities.) present.     Right lower leg: Edema (Edema appears slightly worse on RLE.) present.     Left lower leg: Edema present.  Skin:    Capillary Refill: Capillary refill takes less than 2 seconds.     Coloration: Skin is not pale.     Findings: No rash.  Neurological:     Mental Status: He is alert  and oriented to person, place, and time.    Mental Status:  Alert, oriented, thought content appropriate, able to give a coherent history. Speech fluent without evidence of aphasia. Able to follow 2 step commands without difficulty.  Cranial Nerves:  II:  Peripheral visual fields grossly normal, pupils equal, round, reactive to light III,IV, VI: ptosis not present, extra-ocular motions intact bilaterally  V,VII: smile symmetric, facial light touch sensation equal VIII: hearing grossly normal to voice  X: uvula elevates symmetrically  XI: bilateral shoulder shrug symmetric and strong XII: midline tongue extension without fassiculations Motor:  Normal tone. 5/5 in upper and lower extremities bilaterally including strong and equal grip strength and dorsiflexion/plantar flexion Sensory: light touch normal in all extremities.  Deep Tendon Reflexes: 2+ and symmetric in the biceps and patella Cerebellar: normal finger-to-nose with bilateral upper extremities CV: distal pulses palpable throughout   ED Treatments / Results  Labs (all labs ordered are listed, but only abnormal results are displayed) Labs Reviewed  COMPREHENSIVE METABOLIC PANEL - Abnormal; Notable for the following components:      Result Value   Potassium 3.1 (*)    Chloride 97 (*)    Total Protein 6.1 (*)    Albumin 3.2 (*)    AST 14 (*)    All other components within normal limits  URINALYSIS, ROUTINE W REFLEX MICROSCOPIC - Abnormal; Notable for the following components:   Color, Urine STRAW (*)    pH 9.0 (*)    All other components within normal limits  CBC WITH DIFFERENTIAL/PLATELET  BRAIN NATRIURETIC PEPTIDE  RAPID URINE DRUG SCREEN, HOSP PERFORMED  I-STAT TROPONIN, ED    EKG EKG Interpretation  Date/Time:  Tuesday December 05 2018 17:54:01 EDT Ventricular Rate:  99 PR Interval:    QRS Duration: 100 QT Interval:  341 QTC Calculation: 438 R Axis:   88 Text Interpretation:  Sinus rhythm Low voltage,  precordial leads Confirmed by Virgina Norfolk [phone]) on 12/05/2018 5:56:38 PM   Radiology Dg Chest 2 View  Result Date: 12/05/2018 CLINICAL DATA:  Chest pain EXAM: CHEST - 2 VIEW COMPARISON:  11/27/2018, 11/18/2018 FINDINGS: Mild cardiomegaly. Possible meniscus level inferiorly  on the lateral view. No edema. No pneumothorax. IMPRESSION: 1. Possible meniscus level posteriorly on the lateral view, could potentially represent fluid level in a cavitary lesion. Further evaluation could be obtained with chest CT. 2. Mild cardiomegaly Electronically Signed   By: Jasmine Pang M.D.   On: 12/05/2018 20:07   Vas [place] Lower Extremity Venous (dvt) (only Mc & Wl 7a-7p)  Result Date: 12/05/2018  Lower Venous Study Indications: Swelling.  Limitations: Body habitus. Comparison Study: 11/14/18 Performing Technologist: Jeb Levering RDMS, RVT  Examination Guidelines: A complete evaluation includes B-mode imaging, spectral Doppler, color Doppler, and power Doppler as needed of all accessible portions of each vessel. Bilateral testing is considered an integral part of a complete examination. Limited examinations for reoccurring indications may be performed as noted.  Right Venous Findings: +---------+---------------+---------+-----------+----------+-------+          CompressibilityPhasicitySpontaneityPropertiesSummary +---------+---------------+---------+-----------+----------+-------+ CFV      Full           Yes      Yes                          +---------+---------------+---------+-----------+----------+-------+ SFJ      Full                                                 +---------+---------------+---------+-----------+----------+-------+ FV Prox  Full                                                 +---------+---------------+---------+-----------+----------+-------+ FV Mid   Full                                                  +---------+---------------+---------+-----------+----------+-------+ FV DistalFull                                                 +---------+---------------+---------+-----------+----------+-------+ PFV      Full                                                 +---------+---------------+---------+-----------+----------+-------+ POP      Full           Yes      Yes                          +---------+---------------+---------+-----------+----------+-------+ PTV      Full                                                 +---------+---------------+---------+-----------+----------+-------+ PERO     Full                                                 +---------+---------------+---------+-----------+----------+-------+  Left Venous Findings: +---------+---------------+---------+-----------+----------+-------+          CompressibilityPhasicitySpontaneityPropertiesSummary +---------+---------------+---------+-----------+----------+-------+ CFV      Full           Yes      Yes                          +---------+---------------+---------+-----------+----------+-------+ SFJ      Full                                                 +---------+---------------+---------+-----------+----------+-------+ FV Prox  Full                                                 +---------+---------------+---------+-----------+----------+-------+ FV Mid   Full                                                 +---------+---------------+---------+-----------+----------+-------+ FV DistalFull                                                 +---------+---------------+---------+-----------+----------+-------+ PFV      Full                                                 +---------+---------------+---------+-----------+----------+-------+ POP      Full           Yes      Yes                           +---------+---------------+---------+-----------+----------+-------+ PTV      Full                                                 +---------+---------------+---------+-----------+----------+-------+ PERO     Full                                                 +---------+---------------+---------+-----------+----------+-------+ Previous peroneal DVT appears to have resolved, although this was a technically difficult study.    Summary: Right: There is no evidence of deep vein thrombosis in the lower extremity. No cystic structure found in the popliteal fossa. Left: There is no evidence of deep vein thrombosis in the lower extremity. No cystic structure found in the popliteal fossa.  *See table(s) above for measurements and observations.    Preliminary     Procedures Procedures (including critical care time)  Medications Ordered in ED Medications - No data to display   Initial Impression / Assessment and Plan / ED  Course  I have reviewed the triage vital signs and the nursing notes.  Pertinent labs & imaging results that were available during my care of the patient were reviewed by me and considered in my medical decision making (see chart for details).  Clinical Course as of Dec 04 2029  Tue Dec 05, 2018  1903 CBC is unremarkable.  CBC with Differential [AH]  2011 Possible meniscus level posteriorly on the lateral view, could potentially represent fluid level in a cavitary lesion. Radiology recommends a chest CT. Mild cardiomegaly noted.    DG Chest 2 View [AH]  2030 UDS is negative.   Urine rapid drug screen (hosp performed) [AH]  2030 BNP is within normal limits.  Brain natriuretic peptide [AH]    Clinical Course User Index [AH] Leretha Dykes, PA-C      Patient presents with chest pain, shortness of breath, and leg edema. DVT study is negative. First troponin is negative. EKG without acute changes. Heart is RRR without murmur. CXR reveals possible meniscus level  posteriorly that could represent a fluid in a cavitary lesion. Will order CTA. CTA is pending.  Case has been discussed with Dr. Lockie Mola. At shift change care was transferred to Dr. Lockie Mola who will follow pending studies, re-evaluate and determine disposition.    Final Clinical Impressions(s) / ED Diagnoses   Final diagnoses:  Atypical chest pain  Shortness of breath  Pain in both lower extremities    ED Discharge Orders    None       Leretha Dykes, PA-C 12/05/18 2118    Virgina Norfolk, DO 12/05/18 2223

## 2018-12-05 NOTE — ED Provider Notes (Signed)
Medical screening examination/treatment/procedure(s) were conducted as a shared visit with non-physician practitioner(s) and myself.  I personally evaluated the patient during the encounter. Briefly, the patient is a 43 y.o. male with history of heart failure who presents the ED with lower leg swelling, multiple complaints.  Patient with overall unremarkable vitals.  No fever.  Patient on room air.  Patient had work-up ready initiated by my physician assistant that is overall unremarkable.  EKG shows sinus rhythm.  No ischemic changes.  Troponin negative.  Doubt ACS.  Patient mostly here for leg swelling.  Does not have any active chest pain at this time.  Had intermittent chest pain for about a week.  Likely chronic.  No significant leukocytosis, anemia.  No significant electrolyte abnormality.  DVT study had been ordered and is unremarkable.  Chest x-ray showed possible fluid level and will obtain a CT scan to further evaluate.  Overall low suspicion for infectious process.  No signs of significant volume overload on exam.  BNP is normal.  We will follow-up CT scan and if unremarkable likely patient is okay for discharge to home.  CT scan overall unremarkable.  Patient has recent PE and appears to have still likely residual PE in the right lower lobe however other pulmonary emboli have now resolved.  Patient does not have any new pulmonary embolisms on CT scan.  He does have some mild pleural effusions. But no signs of volume overload on exam, trop and BNP wnl. He has decrease in size of recent infarct of the right middle lung.  Overall patient is on room air.  100% on room air.  No hypoxia, no tachycardia, no hypotension upon my evaluation.  He does not have any complaints at this time.  Patient has been compliant with his Xarelto.  Recommend that he continue this medication and follow-up with his primary care doctor.  Was given return precautions. Told to continue taking xaretlo as prescribed.  This chart  was dictated using voice recognition software.  Despite best efforts to proofread,  errors can occur which can change the documentation meaning.    EKG Interpretation  Date/Time:  Tuesday December 05 2018 17:54:01 EDT Ventricular Rate:  99 PR Interval:    QRS Duration: 100 QT Interval:  341 QTC Calculation: 438 R Axis:   88 Text Interpretation:  Sinus rhythm Low voltage, precordial leads Confirmed by Virgina Norfolk [phone]) on 12/05/2018 5:56:38 PM            Virgina Norfolk, DO 12/05/18 2220

## 2018-12-05 NOTE — ED Notes (Signed)
Patient verbalizes understanding of discharge instructions. Opportunity for questioning and answers were provided. Armband removed by staff, pt discharged from ED ambulatory to home.  

## 2018-12-05 NOTE — ED Notes (Addendum)
Pt did not come in with shoes with [facility]. This RN placed non-slick socks on pt to be able to leave the hospital

## 2018-12-05 NOTE — Patient Instructions (Signed)
High probability of significant obstructive sleep apnea  We will schedule patient for a split-night study I will see him back in the office in about 1 to 3 months  To call with significant concerns

## 2018-12-05 NOTE — ED Notes (Signed)
Pt was verbally abusive to staff at discharge because he was being sent home. Pt was uncooperative at this time

## 2018-12-05 NOTE — Progress Notes (Signed)
LE venous duplex       has been completed. Preliminary results can be found under CV proc through chart review. Eron Goble, BS, RDMS, RVT   

## 2018-12-05 NOTE — ED Triage Notes (Signed)
[facility]- pt from home. Pt complains of chest pain and lower extremity swelling since today. Pt has CHF and is compliant with meds  BP 154/98 HR 104 RR 18 o2 94%

## 2018-12-05 NOTE — Progress Notes (Signed)
[name]    [id number]    [birth year]  Primary Care Physician:Patient, No Pcp Per  Referring Physician: No referring provider defined for this encounter.  Chief complaint:   History of snoring Nonrestorative sleep Daytime sleepiness  HPI:  He was recently hospitalized, treated for healthcare associated pneumonia History of drug use including cocaine, history of schizoaffective disorder  History of PE, acute respiratory failure COPD exacerbation  History is difficult to obtain from the patient, difficult to corroborate history  He admits to snoring Admits to waking up with a headache Admits to a dry mouth in the mornings Admits to daytime sleepiness  He does not sleep well at all Could not tell me when he usually goes to bed  Unknown family history of sleep disordered breathing  He currently is on oxygen supplementation Not able to exercise or walk to any extent  Outpatient Encounter Medications as of 12/05/2018  Medication Sig  . albuterol (PROVENTIL HFA;VENTOLIN HFA) 108 (90 Base) MCG/ACT inhaler Inhale 2 puffs into the lungs every 6 (six) hours as needed for wheezing or shortness of breath.  Marland Kitchen BREO ELLIPTA 200-25 MCG/INH AEPB Inhale 1 puff into the lungs daily.   . furosemide (LASIX) 40 MG tablet Take 1 tablet (40 mg total) by mouth daily.  Marland Kitchen gabapentin (NEURONTIN) 400 MG capsule Take 1,200 mg by mouth 4 (four) times daily.   Marland Kitchen gabapentin (NEURONTIN) 800 MG tablet Take 1 tablet (800 mg total) by mouth 3 (three) times daily.  . hydrochlorothiazide (MICROZIDE) 12.5 MG capsule Take 12.5 mg by mouth daily.  . metoprolol succinate (TOPROL-XL) 25 MG 24 hr tablet Take 25 mg by mouth daily.  Marland Kitchen OLANZapine (ZYPREXA) 20 MG tablet Take 1 tablet (20 mg total) by mouth at bedtime.  . paliperidone (INVEGA SUSTENNA) 156 MG/ML SUSY injection Inject 156 mg into the muscle every 30 (thirty) days. On the 4th of each month  . predniSONE (DELTASONE) 10 MG tablet Prednisone  40 mg po daily x 1 day then Prednisone 30 mg po daily x 1 day then Prednisone 20 mg po daily x 1 day then Prednisone 10 mg daily x 1 day then stop...  . rivaroxaban (XARELTO) 20 MG TABS tablet Take 1 tablet (20 mg total) by mouth daily with supper.  . Rivaroxaban (XARELTO) 15 MG TABS tablet Take 1 tablet (15 mg total) by mouth 2 (two) times daily with a meal for 14 days.   No facility-administered encounter medications on file as of 12/05/2018.     Allergies as of 12/05/2018 - Review Complete 12/05/2018  Allergen Reaction Noted  . Haldol [haloperidol decanoate] Other (See Comments) 09/25/2011    Past Medical History:  Diagnosis Date  . Chronic back pain   . Cocaine use disorder (HCC)   . COPD (chronic obstructive pulmonary disease) (HCC)   . History of suicidal ideation   . Hypertension   . PE (pulmonary thromboembolism) (HCC) 10/2018   recurrent  . Schizophrenic disorder (HCC)     History reviewed. No pertinent surgical history.  Family History  Problem Relation Age of Onset  . Cancer Mother   . Heart disease Father   . Drug abuse Brother     Social History   Socioeconomic History  . Marital status: Single    Spouse name: Not on file  . Number of children: Not on file  . Years of education: Not on file  . Highest education level: Not on file  Occupational  History  . Not on file  Social Needs  . Financial resource strain: Not on file  . Food insecurity:    Worry: Not on file    Inability: Not on file  . Transportation needs:    Medical: Not on file    Non-medical: Not on file  Tobacco Use  . Smoking status: Current Every Day Smoker    Packs/day: 2.50    Years: 15.00    Pack years: 37.50    Types: Cigarettes  . Smokeless tobacco: Never Used  Substance and Sexual Activity  . Alcohol use: No    Comment: seldom  . Drug use: Yes    Types: "Crack" cocaine, Cocaine  . Sexual activity: Never  Lifestyle  . Physical activity:    Days per week: Not on file     Minutes per session: Not on file  . Stress: Not on file  Relationships  . Social connections:    Talks on phone: Not on file    Gets together: Not on file    Attends religious service: Not on file    Active member of club or organization: Not on file    Attends meetings of clubs or organizations: Not on file    Relationship status: Not on file  . Intimate partner violence:    Fear of current or ex partner: Not on file    Emotionally abused: Not on file    Physically abused: Not on file    Forced sexual activity: Not on file  Other Topics Concern  . Not on file  Social History Narrative   ** Merged History Encounter **        Review of Systems  Constitutional: Positive for fatigue.  HENT: Negative.   Eyes: Negative.   Respiratory: Positive for cough and shortness of breath.   Gastrointestinal: Negative.   Psychiatric/Behavioral: Positive for sleep disturbance.  All other systems reviewed and are negative.   Vitals:   12/05/18 1344  BP: 122/78  Pulse: (!) 108  SpO2: 94%     Physical Exam  Constitutional: He appears well-developed and well-nourished.  Generally poor hygiene  HENT:  Head: Normocephalic and atraumatic.  Eyes: Pupils are equal, round, and reactive to light. Conjunctivae and EOM are normal. Right eye exhibits no discharge. Left eye exhibits no discharge.  Neck: Normal range of motion. Neck supple. No tracheal deviation present. No thyromegaly present.  Cardiovascular: Normal rate and regular rhythm.  Pulmonary/Chest: Effort normal and breath sounds normal. No respiratory distress. He has no wheezes. He has no rales. He exhibits no tenderness.  Abdominal: Soft. Bowel sounds are normal. He exhibits no distension. There is no abdominal tenderness.   Results of the Epworth flowsheet 12/05/2018  Sitting and reading 3  Watching TV 3  Sitting, inactive in a public place (e.g. a theatre or a meeting) 1  As a passenger in a car for an hour without a break 3    Lying down to rest in the afternoon when circumstances permit 3  Sitting and talking to someone 0  Sitting quietly after a lunch without alcohol 3  In a car, while stopped for a few minutes in traffic 0  Total score 16   Data Reviewed: Recent hospital records reviewed CT scan reviewed  Assessment:   Moderate to high probability of significant sleep disordered breathing  Nonrestorative sleep  Extensive daytime sleepiness  Morbid Obesity  Plan/Recommendations:  We will schedule the patient for a split-night study  Continue using oxygen  supplementation  Optimize weight as possible  We will see him back in the office in about 1 to 3 months following study   Virl Diamond MD [facility] 12/05/2018, 1:46 PM  CC: No ref. provider found

## 2018-12-06 ENCOUNTER — Encounter (HOSPITAL_COMMUNITY): Payer: Self-pay | Admitting: Emergency Medicine

## 2018-12-06 ENCOUNTER — Emergency Department (HOSPITAL_COMMUNITY)
Admission: EM | Admit: 2018-12-06 | Discharge: 2018-12-06 | Disposition: A | Discharge status: Home / Self Care | Payer: Medicare Other | Attending: Emergency Medicine | Admitting: Emergency Medicine

## 2018-12-06 DIAGNOSIS — Z5321 Procedure and treatment not carried out due to patient leaving prior to being seen by health care provider: Secondary | ICD-10-CM | POA: Diagnosis not present

## 2018-12-06 DIAGNOSIS — J449 Chronic obstructive pulmonary disease, unspecified: Secondary | ICD-10-CM | POA: Diagnosis not present

## 2018-12-06 DIAGNOSIS — I10 Essential (primary) hypertension: Secondary | ICD-10-CM | POA: Diagnosis not present

## 2018-12-06 DIAGNOSIS — I2699 Other pulmonary embolism without acute cor pulmonale: Secondary | ICD-10-CM | POA: Diagnosis not present

## 2018-12-06 DIAGNOSIS — M7918 Myalgia, other site: Secondary | ICD-10-CM | POA: Insufficient documentation

## 2018-12-06 DIAGNOSIS — J9611 Chronic respiratory failure with hypoxia: Secondary | ICD-10-CM | POA: Diagnosis not present

## 2018-12-06 DIAGNOSIS — Z7901 Long term (current) use of anticoagulants: Secondary | ICD-10-CM | POA: Diagnosis not present

## 2018-12-06 NOTE — ED Triage Notes (Signed)
Patient brought in by [facility]. [facility] and came to [facility]. Patient is complaining of pain in arms, legs, and head. Patient states started about two weeks ago.

## 2018-12-07 DIAGNOSIS — R51 Headache: Secondary | ICD-10-CM | POA: Diagnosis not present

## 2018-12-25 DIAGNOSIS — I10 Essential (primary) hypertension: Secondary | ICD-10-CM | POA: Diagnosis not present

## 2019-01-12 DIAGNOSIS — I10 Essential (primary) hypertension: Secondary | ICD-10-CM | POA: Diagnosis not present

## 2019-01-12 DIAGNOSIS — F259 Schizoaffective disorder, unspecified: Secondary | ICD-10-CM | POA: Diagnosis not present

## 2019-01-12 DIAGNOSIS — Z7189 Other specified counseling: Secondary | ICD-10-CM | POA: Diagnosis not present

## 2019-01-12 DIAGNOSIS — Z7901 Long term (current) use of anticoagulants: Secondary | ICD-10-CM | POA: Diagnosis not present

## 2019-01-12 DIAGNOSIS — J449 Chronic obstructive pulmonary disease, unspecified: Secondary | ICD-10-CM | POA: Diagnosis not present

## 2019-01-12 DIAGNOSIS — R609 Edema, unspecified: Secondary | ICD-10-CM | POA: Diagnosis not present

## 2019-01-12 DIAGNOSIS — J9611 Chronic respiratory failure with hypoxia: Secondary | ICD-10-CM | POA: Diagnosis not present

## 2019-01-15 DIAGNOSIS — J9691 Respiratory failure, unspecified with hypoxia: Secondary | ICD-10-CM | POA: Diagnosis not present

## 2019-01-15 DIAGNOSIS — F200 Paranoid schizophrenia: Secondary | ICD-10-CM | POA: Diagnosis not present

## 2019-01-15 DIAGNOSIS — J449 Chronic obstructive pulmonary disease, unspecified: Secondary | ICD-10-CM | POA: Diagnosis not present

## 2019-01-15 DIAGNOSIS — I2699 Other pulmonary embolism without acute cor pulmonale: Secondary | ICD-10-CM | POA: Diagnosis not present

## 2019-01-22 ENCOUNTER — Encounter (HOSPITAL_BASED_OUTPATIENT_CLINIC_OR_DEPARTMENT_OTHER): Payer: Self-pay

## 2019-01-25 DIAGNOSIS — I10 Essential (primary) hypertension: Secondary | ICD-10-CM | POA: Diagnosis not present

## 2019-02-09 DIAGNOSIS — J9611 Chronic respiratory failure with hypoxia: Secondary | ICD-10-CM | POA: Diagnosis not present

## 2019-02-09 DIAGNOSIS — F259 Schizoaffective disorder, unspecified: Secondary | ICD-10-CM | POA: Diagnosis not present

## 2019-02-09 DIAGNOSIS — J449 Chronic obstructive pulmonary disease, unspecified: Secondary | ICD-10-CM | POA: Diagnosis not present

## 2019-02-09 DIAGNOSIS — Z7901 Long term (current) use of anticoagulants: Secondary | ICD-10-CM | POA: Diagnosis not present

## 2019-02-14 ENCOUNTER — Encounter (HOSPITAL_BASED_OUTPATIENT_CLINIC_OR_DEPARTMENT_OTHER): Payer: Self-pay

## 2019-02-21 ENCOUNTER — Inpatient Hospital Stay (HOSPITAL_COMMUNITY): Admission: RE | Admit: 2019-02-21 | Payer: Medicare Other | Source: Ambulatory Visit

## 2019-02-24 ENCOUNTER — Encounter (HOSPITAL_BASED_OUTPATIENT_CLINIC_OR_DEPARTMENT_OTHER): Payer: Medicare Other

## 2019-03-07 ENCOUNTER — Telehealth: Payer: Self-pay | Admitting: Pulmonary Disease

## 2019-03-07 NOTE — Telephone Encounter (Signed)
Going through Dr Judson Roch paperwork  Found a fax from [employer] date 4.22.2020 marked urgent by that company:  Please complete steps 3,4 and remainder of 5 and fax back to [phone].  Also fax a copy to [employer] [phone].  [place] @ [phone] to speak with [place] ([place] who faxed the request) to see if this still needs to be taken care of.  Had to LM for [place] to call me back.

## 2019-03-09 DIAGNOSIS — F259 Schizoaffective disorder, unspecified: Secondary | ICD-10-CM | POA: Diagnosis not present

## 2019-03-09 DIAGNOSIS — Z7901 Long term (current) use of anticoagulants: Secondary | ICD-10-CM | POA: Diagnosis not present

## 2019-03-09 DIAGNOSIS — Z7189 Other specified counseling: Secondary | ICD-10-CM | POA: Diagnosis not present

## 2019-03-09 DIAGNOSIS — J9611 Chronic respiratory failure with hypoxia: Secondary | ICD-10-CM | POA: Diagnosis not present

## 2019-03-09 DIAGNOSIS — J449 Chronic obstructive pulmonary disease, unspecified: Secondary | ICD-10-CM | POA: Diagnosis not present

## 2019-04-03 NOTE — Telephone Encounter (Signed)
[place] again, no answer and VM is full.  WCB.

## 2019-04-10 NOTE — Telephone Encounter (Signed)
ATC [place] x3 Will sign off and await call back

## 2019-05-04 DIAGNOSIS — R440 Auditory hallucinations: Secondary | ICD-10-CM | POA: Diagnosis not present

## 2019-05-04 DIAGNOSIS — R000 Tachycardia, unspecified: Secondary | ICD-10-CM | POA: Diagnosis not present

## 2019-05-04 DIAGNOSIS — B356 Tinea cruris: Secondary | ICD-10-CM | POA: Diagnosis not present

## 2019-05-04 DIAGNOSIS — Z03818 Encounter for observation for suspected exposure to other biological agents ruled out: Secondary | ICD-10-CM | POA: Diagnosis not present

## 2019-05-04 DIAGNOSIS — R45851 Suicidal ideations: Secondary | ICD-10-CM | POA: Diagnosis not present

## 2019-05-05 ENCOUNTER — Encounter (HOSPITAL_COMMUNITY): Payer: Self-pay

## 2019-05-05 ENCOUNTER — Observation Stay (HOSPITAL_COMMUNITY)
Admission: AD | Admit: 2019-05-05 | Discharge: 2019-05-07 | Disposition: A | Discharge status: Home / Self Care | Payer: Medicare Other | Source: Intra-hospital | Attending: Psychiatry | Admitting: Psychiatry

## 2019-05-05 ENCOUNTER — Other Ambulatory Visit: Payer: Self-pay

## 2019-05-05 ENCOUNTER — Other Ambulatory Visit: Payer: Self-pay | Admitting: Family

## 2019-05-05 ENCOUNTER — Encounter (HOSPITAL_COMMUNITY): Payer: Self-pay | Admitting: Emergency Medicine

## 2019-05-05 ENCOUNTER — Emergency Department (HOSPITAL_COMMUNITY)
Admission: EM | Admit: 2019-05-05 | Discharge: 2019-05-05 | Disposition: A | Discharge status: Other Acute Inpatient Hospital | Payer: Medicare Other | Attending: Emergency Medicine | Admitting: Emergency Medicine

## 2019-05-05 DIAGNOSIS — F251 Schizoaffective disorder, depressive type: Secondary | ICD-10-CM | POA: Diagnosis not present

## 2019-05-05 DIAGNOSIS — Z86711 Personal history of pulmonary embolism: Secondary | ICD-10-CM | POA: Insufficient documentation

## 2019-05-05 DIAGNOSIS — F419 Anxiety disorder, unspecified: Secondary | ICD-10-CM | POA: Diagnosis not present

## 2019-05-05 DIAGNOSIS — I10 Essential (primary) hypertension: Secondary | ICD-10-CM | POA: Diagnosis not present

## 2019-05-05 DIAGNOSIS — J449 Chronic obstructive pulmonary disease, unspecified: Secondary | ICD-10-CM | POA: Insufficient documentation

## 2019-05-05 DIAGNOSIS — F17210 Nicotine dependence, cigarettes, uncomplicated: Secondary | ICD-10-CM | POA: Insufficient documentation

## 2019-05-05 DIAGNOSIS — Z03818 Encounter for observation for suspected exposure to other biological agents ruled out: Secondary | ICD-10-CM | POA: Diagnosis not present

## 2019-05-05 DIAGNOSIS — F1410 Cocaine abuse, uncomplicated: Secondary | ICD-10-CM | POA: Diagnosis not present

## 2019-05-05 DIAGNOSIS — Z79899 Other long term (current) drug therapy: Secondary | ICD-10-CM | POA: Diagnosis not present

## 2019-05-05 DIAGNOSIS — R440 Auditory hallucinations: Secondary | ICD-10-CM | POA: Diagnosis not present

## 2019-05-05 DIAGNOSIS — F1994 Other psychoactive substance use, unspecified with psychoactive substance-induced mood disorder: Secondary | ICD-10-CM | POA: Diagnosis present

## 2019-05-05 DIAGNOSIS — Z8249 Family history of ischemic heart disease and other diseases of the circulatory system: Secondary | ICD-10-CM | POA: Insufficient documentation

## 2019-05-05 DIAGNOSIS — Z7901 Long term (current) use of anticoagulants: Secondary | ICD-10-CM | POA: Diagnosis not present

## 2019-05-05 DIAGNOSIS — B356 Tinea cruris: Secondary | ICD-10-CM | POA: Diagnosis not present

## 2019-05-05 DIAGNOSIS — R45851 Suicidal ideations: Secondary | ICD-10-CM | POA: Diagnosis not present

## 2019-05-05 DIAGNOSIS — Z20828 Contact with and (suspected) exposure to other viral communicable diseases: Secondary | ICD-10-CM | POA: Insufficient documentation

## 2019-05-05 DIAGNOSIS — M549 Dorsalgia, unspecified: Secondary | ICD-10-CM | POA: Insufficient documentation

## 2019-05-05 DIAGNOSIS — G8929 Other chronic pain: Secondary | ICD-10-CM | POA: Insufficient documentation

## 2019-05-05 DIAGNOSIS — Z888 Allergy status to other drugs, medicaments and biological substances status: Secondary | ICD-10-CM | POA: Insufficient documentation

## 2019-05-05 DIAGNOSIS — Z7951 Long term (current) use of inhaled steroids: Secondary | ICD-10-CM | POA: Insufficient documentation

## 2019-05-05 DIAGNOSIS — F209 Schizophrenia, unspecified: Secondary | ICD-10-CM | POA: Diagnosis present

## 2019-05-05 DIAGNOSIS — R000 Tachycardia, unspecified: Secondary | ICD-10-CM | POA: Diagnosis not present

## 2019-05-05 DIAGNOSIS — R197 Diarrhea, unspecified: Secondary | ICD-10-CM | POA: Diagnosis not present

## 2019-05-05 LAB — COMPREHENSIVE METABOLIC PANEL
ALT: 18 U/L (ref 0–44)
AST: 18 U/L (ref 15–41)
Albumin: 4.1 g/dL (ref 3.5–5.0)
Alkaline Phosphatase: 92 U/L (ref 38–126)
Anion gap: 12 (ref 5–15)
BUN: 7 mg/dL (ref 6–20)
CO2: 28 mmol/L (ref 22–32)
Calcium: 9.2 mg/dL (ref 8.9–10.3)
Chloride: 97 mmol/L — ABNORMAL LOW (ref 98–111)
Creatinine, Ser: 0.82 mg/dL (ref 0.61–1.24)
GFR calc Af Amer: >60 mL/min (ref >60)
GFR calc non Af Amer: >60 mL/min (ref >60)
Glucose, Bld: 94 mg/dL (ref 70–99)
Potassium: 2.9 mmol/L — ABNORMAL LOW (ref 3.5–5.1)
Sodium: 137 mmol/L (ref 135–145)
Total Bilirubin: 1.4 mg/dL — ABNORMAL HIGH (ref 0.3–1.2)
Total Protein: 7.4 g/dL (ref 6.5–8.1)

## 2019-05-05 LAB — RAPID URINE DRUG SCREEN, HOSP PERFORMED
Amphetamines: NOT DETECTED
Barbiturates: NOT DETECTED
Benzodiazepines: NOT DETECTED
Cocaine: POSITIVE — AB
Opiates: NOT DETECTED
Tetrahydrocannabinol: POSITIVE — AB

## 2019-05-05 LAB — CBC WITH DIFFERENTIAL/PLATELET
Abs Immature Granulocytes: 0.03 10*3/uL (ref 0.00–0.07)
Basophils Absolute: 0.1 10*3/uL (ref 0.0–0.1)
Basophils Relative: 1 %
Eosinophils Absolute: 0.2 10*3/uL (ref 0.0–0.5)
Eosinophils Relative: 3 %
HCT: 47.1 % (ref 39.0–52.0)
Hemoglobin: 15.7 g/dL (ref 13.0–17.0)
Immature Granulocytes: 0 %
Lymphocytes Relative: 31 %
Lymphs Abs: 2.5 10*3/uL (ref 0.7–4.0)
MCH: 31.5 pg (ref 26.0–34.0)
MCHC: 33.3 g/dL (ref 30.0–36.0)
MCV: 94.4 fL (ref 80.0–100.0)
Monocytes Absolute: 0.5 10*3/uL (ref 0.1–1.0)
Monocytes Relative: 6 %
Neutro Abs: 4.7 10*3/uL (ref 1.7–7.7)
Neutrophils Relative %: 59 %
Platelets: 216 10*3/uL (ref 150–400)
RBC: 4.99 MIL/uL (ref 4.22–5.81)
RDW: 13 % (ref 11.5–15.5)
WBC: 8 10*3/uL (ref 4.0–10.5)
nRBC: 0 % (ref 0.0–0.2)

## 2019-05-05 LAB — ETHANOL: Alcohol, Ethyl (B): <10 mg/dL (ref <10)

## 2019-05-05 LAB — SARS CORONAVIRUS 2 BY RT PCR (HOSPITAL ORDER, PERFORMED IN ~~LOC~~ HOSPITAL LAB): SARS Coronavirus 2: NEGATIVE

## 2019-05-05 MED ORDER — ALUM & MAG HYDROXIDE-SIMETH 200-200-20 MG/5ML PO SUSP: 30.0000 mL | ORAL | Status: DC | PRN

## 2019-05-05 MED ORDER — HYDROCHLOROTHIAZIDE 12.5 MG PO CAPS
12.5000 mg | ORAL_CAPSULE | Freq: Every day | ORAL | Status: DC
  Administered 2019-05-05: 12.5 mg via ORAL
  Filled 2019-05-05: qty 1

## 2019-05-05 MED ORDER — CLOTRIMAZOLE 1 % EX SOLN
Freq: Two times a day (BID) | CUTANEOUS | Status: DC
  Administered 2019-05-05: 21:00:00 via TOPICAL
  Filled 2019-05-05: qty 10

## 2019-05-05 MED ORDER — POTASSIUM CHLORIDE CRYS ER 20 MEQ PO TBCR
40.0000 meq | EXTENDED_RELEASE_TABLET | Freq: Two times a day (BID) | ORAL | Status: DC
  Administered 2019-05-05: 40 meq via ORAL
  Filled 2019-05-05: qty 2

## 2019-05-05 MED ORDER — ACETAMINOPHEN 500 MG PO TABS: 1000.0000 mg | ORAL_TABLET | Freq: Four times a day (QID) | ORAL | Status: DC | PRN

## 2019-05-05 MED ORDER — FLUOXETINE HCL 20 MG PO CAPS
20.0000 mg | ORAL_CAPSULE | Freq: Every day | ORAL | Status: DC
  Administered 2019-05-05: 12:00:00 20 mg via ORAL
  Filled 2019-05-05: qty 1

## 2019-05-05 MED ORDER — RIVAROXABAN 20 MG PO TABS
20.0000 mg | ORAL_TABLET | Freq: Every day | ORAL | Status: DC
  Administered 2019-05-05: 10:00:00 20 mg via ORAL
  Filled 2019-05-05 (×2): qty 1

## 2019-05-05 MED ORDER — OLANZAPINE 10 MG PO TABS
20.0000 mg | ORAL_TABLET | Freq: Every day | ORAL | Status: DC
  Administered 2019-05-05: 21:00:00 20 mg via ORAL
  Filled 2019-05-05: qty 2

## 2019-05-05 MED ORDER — METOPROLOL SUCCINATE ER 25 MG PO TB24
25.0000 mg | ORAL_TABLET | Freq: Every day | ORAL | Status: DC
  Administered 2019-05-05: 25 mg via ORAL
  Filled 2019-05-05: qty 1

## 2019-05-05 MED ORDER — HYDROXYZINE HCL 25 MG PO TABS
25.0000 mg | ORAL_TABLET | Freq: Three times a day (TID) | ORAL | Status: DC | PRN
  Administered 2019-05-06: 25 mg via ORAL
  Filled 2019-05-05: qty 1

## 2019-05-05 MED ORDER — FLUTICASONE FUROATE-VILANTEROL 200-25 MCG/INH IN AEPB
1.0000 | INHALATION_SPRAY | Freq: Every day | RESPIRATORY_TRACT | Status: DC
  Filled 2019-05-05: qty 28

## 2019-05-05 MED ORDER — MAGNESIUM HYDROXIDE 400 MG/5ML PO SUSP: 30.0000 mL | Freq: Every day | ORAL | Status: DC | PRN

## 2019-05-05 MED ORDER — ACETAMINOPHEN 325 MG PO TABS
650.0000 mg | ORAL_TABLET | Freq: Four times a day (QID) | ORAL | Status: DC | PRN
  Administered 2019-05-06 – 2019-05-07 (×2): 650 mg via ORAL
  Filled 2019-05-05 (×2): qty 2

## 2019-05-05 MED ORDER — ALBUTEROL SULFATE HFA 108 (90 BASE) MCG/ACT IN AERS: 2.0000 | INHALATION_SPRAY | Freq: Four times a day (QID) | RESPIRATORY_TRACT | Status: DC | PRN

## 2019-05-05 NOTE — ED Notes (Signed)
Tele psych machine at bedside 

## 2019-05-05 NOTE — Progress Notes (Signed)
Refused evening vital signs.
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
## 2019-05-05 NOTE — ED Notes (Signed)
Report on Situation, Background, Assessment, and Recommendations received from Ashley, RN. Patient alert and in no visible distress. Patient made aware of 1:1 observations and security cameras for their safety. Patient instructed to come to me with needs or concerns. 

## 2019-05-05 NOTE — ED Notes (Signed)
Patient's belongings taken and secured behind nurse's station. One shirt, boxers and pair of jeans and one pair of white tennis shoes. Wallet noticed in pants pocket. Patient changed into purple scrubs and yellow non-slip socks.

## 2019-05-05 NOTE — Progress Notes (Signed)
[facility] transport on there way for transport to [facility] observation unit per orders.
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
## 2019-05-05 NOTE — Plan of Care (Signed)
[place] Observation Crisis Plan  Reason for Crisis Plan:  Crisis Stabilization   Plan of Care:  Referral for Inpatient Hospitalization  Family Support:      Current Living Environment:     Insurance:   Hospital Account    Name Acct ID Class Status Primary Coverage   [place], [place] [id number] BEHAVIORAL HEALTH OBSERVATION Open MEDICARE - MEDICARE PART A AND B        Guarantor Account (for Hospital Account [id number])    Name Relation to Pt Service Area Active? Acct Type   Alesia Banda T Self [facility] Yes Behavioral Health   Address Phone       [address] [place], [place] [address] [phone](H)          Coverage Information (for Hospital Account [id number])    1. [place] PART A AND B    F/O Payor/Plan Precert #   MEDICARE/MEDICARE PART A AND B    Subscriber Subscriber #   Elvyn, [place] [id number]   Address Phone   [address] [place], [place] [address]        2. [facility] MEDICAID/[place] MEDICA[place]    F/O Payor/Plan Precert #   [facility] MEDICAID/[place] MEDICAID    Subscriber Subscriber #   Rayvon, Dakin [id number]   Address Phone   [address] [place], [place] [address] [phone]          Legal Guardian:     Primary Care Provider:  Patient, No Pcp Per  Current Outpatient Providers:  Dr Corena Pilgrim  Psychiatrist:     Counselor/Therapist:     Compliant with Medications:  Yes    Additional Information:   Jesusita Oka 8/8/202011:55 PM

## 2019-05-05 NOTE — ED Triage Notes (Addendum)
Pt BIB [facility] complaining of bilateral leg pain and body aches. He also states that he thinks that he has been bitten by something on his buttocks. Reports a hx of blood clots. Ambulatory. A&Ox4. Also states, "I shitted out some blood 2 days ago."

## 2019-05-05 NOTE — Consult Note (Addendum)
  [name], 43 y.o., male patient seen via tele psych by this provider, Dr. Darleene Cleaver; and chart reviewed on 05/05/19.  On evaluation [name] reporting suicidal ideation with plan to overdose.  States that his medication is not work for depression. States that he is compliant with medications and gets a monthly injection of Invega (next dose due 05/19/19).  States that his medications are prescribed by PCP.  States that he is compliant with all other psychotropic medications.  Patient also endorses auditory hallucinations with voices telling him to do bad things.    Medication management Started on Prozac 20 mg for Depression Continue Zyprexa 20 mg Q hs Invega Sustenna due 05/19/19  Recommendation:  Admit to observation 24 hour; will reassess tomorrow  Shuvon B. Rankin, NP Patient seen face-to-face for psychiatric evaluation, chart reviewed and case discussed with the physician extender and developed treatment plan. Reviewed the information documented and agree with the treatment plan. Corena Pilgrim, MD

## 2019-05-05 NOTE — ED Provider Notes (Signed)
[facility]-EMERGENCY DEPT Provider Note  CSN: [id number] Arrival date & time: 05/05/19 0143  Chief Complaint(s) Leg Pain  HPI [name] is a 43 y.o. male with past medical history listed below including pulmonary emboli on Xarelto, schizophrenia and polysubstance abuse who presents to the emergency department for chronic leg pain and back pain but he is most concerned with perirectal irritation that is been ongoing for several days.  Patient reports that he has been having diarrhea for almost a year.  Reports that he had an episode of hematochezia with a bowel movement 2 days ago which has not recurred.  Denies any fevers or chills.  No abdominal pain.  No urinary symptoms.  No shortness of breath.  Additionally patient reports that he feels depressed and is suicidal.  He does not have any active plans.  Reports AH. Denies any homicidal ideations.    HPI  Past Medical History Past Medical History:  Diagnosis Date  . Chronic back pain   . Cocaine use disorder (HCC)   . COPD (chronic obstructive pulmonary disease) (HCC)   . History of suicidal ideation   . Hypertension   . PE (pulmonary thromboembolism) (HCC) 10/2018   recurrent  . Schizophrenic disorder [facility](HCC)    Patient Active Problem List   Diagnosis Date Noted  . HCAP (healthcare-associated pneumonia) 11/28/2018  . Sepsis (HCC) 11/28/2018  . Acute respiratory failure with hypoxia (HCC) 11/13/2018  . Pulmonary emboli (HCC) 11/13/2018  . COPD exacerbation (HCC)   . Hypertension   . Chronic back pain   . Pulmonary embolism (HCC) 12/13/2017  . recurrent Pulmonary embolism, bilateral (HCC)  3/19 and 2/20   . Paranoid schizophrenia (HCC)   . Cocaine abuse with cocaine-induced mood disorder (HCC) 06/30/2017  . Substance induced mood disorder (HCC) 11/29/2015  . Homeless   . Suicidal ideation   . Schizoaffective disorder, unspecified type (HCC)   . Schizoaffective disorder (HCC) 05/08/2013  . Cocaine  abuse (HCC) 11/11/2011   Home Medication(s) Prior to Admission medications   Medication Sig Start Date End Date Taking? Authorizing Provider  acetaminophen (TYLENOL) 500 MG tablet Take 1,000 mg by mouth every 6 (six) hours as needed for moderate pain.   Yes [provider]  albuterol (PROVENTIL HFA;VENTOLIN HFA) 108 (90 Base) MCG/ACT inhaler Inhale 2 puffs into the lungs every 6 (six) hours as needed for wheezing or shortness of breath. 11/20/18  Yes Lama, Sarina IllGagan S, MD  BREO ELLIPTA 200-25 MCG/INH AEPB Inhale 1 puff into the lungs daily.  11/25/18  Yes [provider]  hydrochlorothiazide (MICROZIDE) 12.5 MG capsule Take 12.5 mg by mouth daily. 11/25/18  Yes [provider]  metoprolol succinate (TOPROL-XL) 25 MG 24 hr tablet Take 25 mg by mouth daily. 11/25/18  Yes [provider]  OLANZapine (ZYPREXA) 20 MG tablet Take 1 tablet (20 mg total) by mouth at bedtime. 11/20/18  Yes Meredeth IdeLama, Gagan S, MD  paliperidone (INVEGA SUSTENNA) 156 MG/ML SUSY injection Inject 156 mg into the muscle every 30 (thirty) days. On the 4th of each month   Yes [provider]  rivaroxaban (XARELTO) 20 MG TABS tablet Take 1 tablet (20 mg total) by mouth daily with supper. 12/05/18  Yes Meredeth IdeLama, Gagan S, MD  sodium chloride (OCEAN) 0.65 % SOLN nasal spray Place 1 spray into both nostrils 2 (two) times daily as needed for congestion.   Yes [provider]  furosemide (LASIX) 40 MG tablet Take 1 tablet (40 mg total) by mouth  daily. Patient not taking: Reported on 12/05/2018 11/20/18 11/20/19  Meredeth IdeLama, Gagan S, MD  gabapentin (NEURONTIN) 800 MG tablet Take 1 tablet (800 mg total) by mouth 3 (three) times daily. Patient not taking: Reported on 12/05/2018 11/20/18   Meredeth IdeLama, Gagan S, MD  Rivaroxaban (XARELTO) 15 MG TABS tablet Take 1 tablet (15 mg total) by mouth 2 (two) times daily with a meal for 14 days. Patient not taking: Reported on 05/05/2019 11/20/18 12/05/18  Meredeth IdeLama, Gagan S, MD                                                                                                                                     Past Surgical History History reviewed. No pertinent surgical history. Family History Family History  Problem Relation Age of Onset  . Cancer Mother   . Heart disease Father   . Drug abuse Brother     Social History Social History   Tobacco Use  . Smoking status: Current Every Day Smoker    Packs/day: 2.50    Years: 15.00    Pack years: 37.50    Types: Cigarettes  . Smokeless tobacco: Never Used  Substance Use Topics  . Alcohol use: No    Comment: seldom  . Drug use: Yes    Types: "Crack" cocaine, Cocaine   Allergies Haldol [haloperidol decanoate]  Review of Systems Review of Systems All other systems are reviewed and are negative for acute change except as noted in the HPI  Physical Exam Vital Signs  I have reviewed the triage vital signs BP (!) 142/92   Pulse 96   Temp 98.2 F (36.8 C) (Oral)   Resp 20   Wt (!) 147.9 kg   SpO2 91%   BMI 46.78 kg/m   Physical Exam Vitals signs reviewed.  Constitutional:      General: He is not in acute distress.    Appearance: He is well-developed. He is not diaphoretic.  HENT:     Head: Normocephalic and atraumatic.     Jaw: No trismus.     Right Ear: External ear normal.     Left Ear: External ear normal.     Nose: Nose normal.  Eyes:     General: No scleral icterus.    Conjunctiva/sclera: Conjunctivae normal.  Neck:     Musculoskeletal: Normal range of motion.     Trachea: Phonation normal.  Cardiovascular:     Rate and Rhythm: Normal rate and regular rhythm.  Pulmonary:     Effort: Pulmonary effort is normal. No respiratory distress.     Breath sounds: No stridor.  Abdominal:     General: There is no distension.  Genitourinary:    Comments: DRE with light brown stool. No gross blood.  Perirectal and gluteal region with mild hyperemia.  Musculoskeletal: Normal range of motion.   Neurological:     Mental Status: He is alert and oriented to person, place,  and time.  Psychiatric:        Behavior: Behavior normal.     ED Results and Treatments Labs (all labs ordered are listed, but only abnormal results are displayed) Labs Reviewed  COMPREHENSIVE METABOLIC PANEL - Abnormal; Notable for the following components:      Result Value   Potassium 2.9 (*)    Chloride 97 (*)    Total Bilirubin 1.4 (*)    All other components within normal limits  RAPID URINE DRUG SCREEN, HOSP PERFORMED - Abnormal; Notable for the following components:   Cocaine POSITIVE (*)    Tetrahydrocannabinol POSITIVE (*)    All other components within normal limits  ETHANOL  CBC WITH DIFFERENTIAL/PLATELET  POC OCCULT BLOOD, ED                                                                                                                         EKG  EKG Interpretation  Date/Time:  Saturday May 05 2019 04:51:16 EDT Ventricular Rate:  104 PR Interval:    QRS Duration: 112 QT Interval:  364 QTC Calculation: 479 R Axis:   101 Text Interpretation:  Sinus tachycardia Borderline intraventricular conduction delay Low voltage, precordial leads Borderline prolonged QT interval Otherwise no significant change Confirmed by Addison Lank ([id number]) on 05/05/2019 6:40:25 AM      Radiology No results found.  Pertinent labs & imaging results that were available during my care of the patient were reviewed by me and considered in my medical decision making (see chart for details).  Medications Ordered in ED Medications  metoprolol succinate (TOPROL-XL) 24 hr tablet 25 mg (has no administration in time range)  OLANZapine (ZYPREXA) tablet 20 mg (has no administration in time range)  rivaroxaban (XARELTO) tablet 20 mg (has no administration in time range)  hydrochlorothiazide (MICROZIDE) capsule 12.5 mg (has no administration in time range)  fluticasone furoate-vilanterol (BREO ELLIPTA) 200-25 MCG/INH  1 puff (has no administration in time range)  acetaminophen (TYLENOL) tablet 1,000 mg (has no administration in time range)  albuterol (VENTOLIN HFA) 108 (90 Base) MCG/ACT inhaler 2 puff (has no administration in time range)  clotrimazole (LOTRIMIN) 1 % topical solution (has no administration in time range)                                                                                                                                    Procedures Procedures  (  including critical care time)  Medical Decision Making / ED Course I have reviewed the nursing notes for this encounter and the patient's prior records (if available in EHR or on provided paperwork).   Carl GoreBrad T Wheller was evaluated in Emergency Department on 05/05/2019 for the symptoms described in the history of present illness. He was evaluated in the context of the global COVID-19 pandemic, which necessitated consideration that the patient might be at risk for infection with the SARS-CoV-2 virus that causes COVID-19. Institutional protocols and algorithms that pertain to the evaluation of patients at risk for COVID-19 are in a state of rapid change based on information released by regulatory bodies including the CDC and federal and state organizations. These policies and algorithms were followed during the patient's care in the ED.  Patient presents with multiple complaints. Endorsing chronic leg and back pain without acute changes.  Most concerned with perirectal and gluteal irritation which is consistent with tinea cruris.  Not consistent with cellulitis. Topical Lotrimin.  Reports an episode of hematochezia.  Given the fact that he is on Xarelto and reports being compliant.  Work-up was obtained grossly reassuring with stable hemoglobin.  Patient has light brown stool and no active bleeding.  Patient also endorsing depression with suicidal ideation and auditory hallucinations.  Medically clear for behavioral health evaluation and  management.        Final Clinical Impression(s) / ED Diagnoses Final diagnoses:  Tinea cruris  Suicidal ideation  Auditory hallucination      This chart was dictated using voice recognition software.  Despite best efforts to proofread,  errors can occur which can change the documentation meaning.   Nira Connardama,  Eduardo, MD 05/05/19 [phone]

## 2019-05-05 NOTE — ED Notes (Signed)
This RN was notified that patient is approved to go to [facility] by [facility]. Will start coordination to [facility] with ER team.

## 2019-05-05 NOTE — ED Notes (Addendum)
Patient endorses multiple complaints, says his left leg and his "ass" hurts, that he has been "shitting blood," having diarrhea, stomach pain, shortness of breath, anxiety and back pain. Unable to describe what color his stool was, just that "it doesn't look right." Patient also said he chose the wrong path in life as a teenager and that his health is failing. He is concerned about blood clots in his lungs as well.

## 2019-05-06 DIAGNOSIS — F12988 Cannabis use, unspecified with other cannabis-induced disorder: Secondary | ICD-10-CM

## 2019-05-06 DIAGNOSIS — F251 Schizoaffective disorder, depressive type: Secondary | ICD-10-CM | POA: Diagnosis not present

## 2019-05-06 DIAGNOSIS — F1414 Cocaine abuse with cocaine-induced mood disorder: Secondary | ICD-10-CM | POA: Diagnosis not present

## 2019-05-06 DIAGNOSIS — F1994 Other psychoactive substance use, unspecified with psychoactive substance-induced mood disorder: Secondary | ICD-10-CM

## 2019-05-06 LAB — COMPREHENSIVE METABOLIC PANEL
ALT: 20 U/L (ref 0–44)
AST: 18 U/L (ref 15–41)
Albumin: 3.8 g/dL (ref 3.5–5.0)
Alkaline Phosphatase: 106 U/L (ref 38–126)
Anion gap: 10 (ref 5–15)
BUN: 11 mg/dL (ref 6–20)
CO2: 30 mmol/L (ref 22–32)
Calcium: 9.4 mg/dL (ref 8.9–10.3)
Chloride: 103 mmol/L (ref 98–111)
Creatinine, Ser: 0.95 mg/dL (ref 0.61–1.24)
GFR calc Af Amer: >60 mL/min (ref >60)
GFR calc non Af Amer: >60 mL/min (ref >60)
Glucose, Bld: 107 mg/dL — ABNORMAL HIGH (ref 70–99)
Potassium: 3.6 mmol/L (ref 3.5–5.1)
Sodium: 143 mmol/L (ref 135–145)
Total Bilirubin: 0.5 mg/dL (ref 0.3–1.2)
Total Protein: 7.2 g/dL (ref 6.5–8.1)

## 2019-05-06 MED ORDER — POTASSIUM CHLORIDE CRYS ER 20 MEQ PO TBCR
20.0000 meq | EXTENDED_RELEASE_TABLET | Freq: Two times a day (BID) | ORAL | Status: AC
  Administered 2019-05-06 (×2): 20 meq via ORAL
  Filled 2019-05-06 (×2): qty 1

## 2019-05-06 NOTE — Progress Notes (Signed)
D: Patient alert and oriented. Affect/mood: Flat in affect, depressed. At present, patient denies SI, HI, AVH at this time though endorses that his depressive symptoms remain. Denies pain at this time though this morning patient endorsed mild pain to bilateral legs. PRN tylenol given at that time. Patient has not been out in the milieu, and has remained in his bed lying asleep for most of the day. Potassium administered as ordered for low lab result. Patient verbalizes understanding of CMP lab draw ordered this evening.   A: Scheduled medications administered to patient per MD order. Support and encouragement provided. Routine safety checks conducted every 15 minutes. Patient informed to notify staff with problems or concerns.  R: No adverse drug reactions noted. Patient contracts for safety at this time. Patient remains calm, and cooperative.Will continue to monitor.   [place] NOVEL CORONAVIRUS (COVID-19) DAILY CHECK-OFF SYMPTOMS - answer yes or no to each - every day NO YES  Have you had a fever in the past 24 hours?  . Fever (Temp > 37.80C / 100F) X   Have you had any of these symptoms in the past 24 hours? . New Cough .  Sore Throat  .  Shortness of Breath .  Difficulty Breathing .  Unexplained Body Aches   X   Have you had any one of these symptoms in the past 24 hours not related to allergies?   . Runny Nose .  Nasal Congestion .  Sneezing   X   If you have had runny nose, nasal congestion, sneezing in the past 24 hours, has it worsened?  X   EXPOSURES - check yes or no X   Have you traveled outside the state in the past 14 days?  X   Have you been in contact with someone with a confirmed diagnosis of COVID-19 or PUI in the past 14 days without wearing appropriate PPE?  X   Have you been living in the same home as a person with confirmed diagnosis of COVID-19 or a PUI (household contact)?    X   Have you been diagnosed with COVID-19?    X              What to do next:  Answered NO to all: Answered YES to anything:   Proceed with unit schedule Follow the [facility] Inpatient Flowsheet.

## 2019-05-06 NOTE — H&P (Addendum)
[facility] Provider Admission PAA/H&P  Patient Identification: [name] MRN:  [id number] Date of Evaluation:  05/06/2019 Chief Complaint:  Schizophrenia Principal Diagnosis: Schizoaffective disorder, depressive type ([place]) Diagnosis:  Principal Problem:   Schizoaffective disorder, depressive type ([place]) Active Problems:   Substance induced mood disorder (HCC)   Schizophrenia ([place])  History of Present Illness: [name], 43 y.o., male patient seen face to face by this provider, Dr. Darleene Cleaver; and chart reviewed on 05/06/19.  [name] present to hospital ED with complaints of suicide with plan to overdose. Reported that his medications was not working for his depression.  UDS also positive for cocaine and marijuana.  Patient stated that he was compliant with his medication and got an [place] injection monthly.  Patient was started on Prozac 20 mg daily for depression and admitted to observation for 24 hours.  Today patient states that he is feeling somewhat better; reports no adverse reaction to Prozac; slept well last night.  Currently denies auditory/visual hallucinations.  Patient continues to report "still feeling depressed"  Education discussed related to antidepressant and time for it to work; understanding voiced. Will not directly deny suicidal ideation.  During evaluation [name] is alert/oriented x 4; calm/cooperative; and mood is congruent with affect.  Patient has had some improvement; continues to endorse depression; He does not appear to be responding to internal/external stimuli or delusional thoughts.  Patient denies homicidal ideation, psychosis, and paranoia.  Patient answered question appropriately.   Labs checked K+ level (potassium) 2.9; patient was ordered K-Dur 20 MEq Bid for 2 doses.  CMP ordered for tonight's labs to recheck K+ level    Associated Signs/Symptoms: Depression Symptoms:  depressed mood, fatigue, recurrent thoughts of  death, anxiety, (Hypo) Manic Symptoms:  Hallucinations, Impulsivity, Irritable Mood, Labiality of Mood, Anxiety Symptoms:  Excessive Worry, Psychotic Symptoms:  Hallucinations: Auditory PTSD Symptoms: NA Total Time spent with patient: 45 minutes  Past Psychiatric History: Schizoaffective disorder, Cocaine abuse; cocaine induced mood disorder, paranoid schizophrenia  Is the patient at risk to self? Yes.    Has the patient been a risk to self in the past 6 months? Yes.    Has the patient been a risk to self within the distant past? Yes.    Is the patient a risk to others? No.  Has the patient been a risk to others in the past 6 months? No.  Has the patient been a risk to others within the distant past? No.   Prior Inpatient Therapy:  Yes Prior Outpatient Therapy:   Yes Alcohol Screening:   Substance Abuse History in the last 12 months:  Yes.   Consequences of Substance Abuse: Family Consequences:  Discord Previous Psychotropic Medications: Yes  Psychological Evaluations: Yes  Past Medical History:  Past Medical History:  Diagnosis Date  . Chronic back pain   . Cocaine use disorder ([place])   . COPD (chronic obstructive pulmonary disease) ([place])   . History of suicidal ideation   . Hypertension   . PE (pulmonary thromboembolism) ([place]) 10/2018   recurrent  . Schizophrenic disorder ([place])    History reviewed. No pertinent surgical history. Family History:  Family History  Problem Relation Age of Onset  . Cancer Mother   . Heart disease Father   . Drug abuse Brother    Family Psychiatric History: See above list Tobacco Screening:   Social History:  Social History   Substance and Sexual Activity  Alcohol Use No   Comment: seldom  Social History   Substance and Sexual Activity  Drug Use Yes  . Types: "Crack" cocaine, Cocaine    Additional Social History:  Allergies:   Allergies  Allergen Reactions  . Haldol [Haloperidol Decanoate] Other (See Comments)    'I  curl up"   Lab Results:  Results for orders placed or performed during the hospital encounter of 05/05/19 (from the past 48 hour(s))  Comprehensive metabolic panel     Status: Abnormal   Collection Time: 05/05/19  4:47 AM  Result Value Ref Range   Sodium 137 135 - 145 mmol/L   Potassium 2.9 (L) 3.5 - 5.1 mmol/L   Chloride 97 (L) 98 - 111 mmol/L   CO2 28 22 - 32 mmol/L   Glucose, Bld 94 70 - 99 mg/dL   BUN 7 6 - 20 mg/dL   Creatinine, Ser 1.610.82 0.61 - 1.24 mg/dL   Calcium 9.2 8.9 - 09.610.3 mg/dL   Total Protein 7.4 6.5 - 8.1 g/dL   Albumin 4.1 3.5 - 5.0 g/dL   AST 18 15 - 41 U/L   ALT 18 0 - 44 U/L   Alkaline Phosphatase 92 38 - 126 U/L   Total Bilirubin 1.4 (H) 0.3 - 1.2 mg/dL   GFR calc non Af Amer >60 >60 mL/min   GFR calc Af Amer >60 >60 mL/min   Anion gap 12 5 - 15    Comment: Performed at [facility], [address] [place], [place] [address]  Ethanol     Status: None   Collection Time: 05/05/19  4:47 AM  Result Value Ref Range   Alcohol, Ethyl (B) <10 <10 mg/dL    Comment: (NOTE) Lowest detectable limit for serum alcohol is 10 mg/dL. For medical purposes only. Performed at [facility], [address] [place], [place] [address]   Urine rapid drug screen (hosp performed)     Status: Abnormal   Collection Time: 05/05/19  4:47 AM  Result Value Ref Range   Opiates NONE DETECTED NONE DETECTED   Cocaine POSITIVE (A) NONE DETECTED   Benzodiazepines NONE DETECTED NONE DETECTED   Amphetamines NONE DETECTED NONE DETECTED   Tetrahydrocannabinol POSITIVE (A) NONE DETECTED   Barbiturates NONE DETECTED NONE DETECTED    Comment: (NOTE) DRUG SCREEN FOR MEDICAL PURPOSES ONLY.  IF CONFIRMATION IS NEEDED FOR ANY PURPOSE, NOTIFY LAB WITHIN 5 DAYS. LOWEST DETECTABLE LIMITS FOR URINE DRUG SCREEN Drug Class                     Cutoff (ng/mL) Amphetamine and metabolites    1000 Barbiturate and metabolites    200 Benzodiazepine                  200 Tricyclics and metabolites     300 Opiates and metabolites        300 Cocaine and metabolites        300 THC                            50 Performed at [facility], [address] [place], [place] [address]   CBC with Diff     Status: None   Collection Time: 05/05/19  4:47 AM  Result Value Ref Range   WBC 8.0 4.0 - 10.5 K/uL   RBC 4.99 4.22 - 5.81 MIL/uL   Hemoglobin 15.7 13.0 - 17.0 g/dL   HCT 95.647.1 21.339.0 - 08.652.0 %  MCV 94.4 80.0 - 100.0 fL   MCH 31.5 26.0 - 34.0 pg   MCHC 33.3 30.0 - 36.0 g/dL   RDW 16.1 09.6 - 04.5 %   Platelets 216 150 - 400 K/uL   nRBC 0.0 0.0 - 0.2 %   Neutrophils Relative % 59 %   Neutro Abs 4.7 1.7 - 7.7 K/uL   Lymphocytes Relative 31 %   Lymphs Abs 2.5 0.7 - 4.0 K/uL   Monocytes Relative 6 %   Monocytes Absolute 0.5 0.1 - 1.0 K/uL   Eosinophils Relative 3 %   Eosinophils Absolute 0.2 0.0 - 0.5 K/uL   Basophils Relative 1 %   Basophils Absolute 0.1 0.0 - 0.1 K/uL   Immature Granulocytes 0 %   Abs Immature Granulocytes 0.03 0.00 - 0.07 K/uL    Comment: Performed at [facility], [address] [place], [place] [address]  SARS Coronavirus 2 [facility] order, Performed in [facility] hospital lab) Nasopharyngeal Nasopharyngeal Swab     Status: None   Collection Time: 05/05/19  2:23 PM   Specimen: Nasopharyngeal Swab  Result Value Ref Range   SARS Coronavirus 2 NEGATIVE NEGATIVE    Comment: (NOTE) If result is NEGATIVE SARS-CoV-2 target nucleic acids are NOT DETECTED. The SARS-CoV-2 RNA is generally detectable in upper and lower  respiratory specimens during the acute phase of infection. The lowest  concentration of SARS-CoV-2 viral copies this assay can detect is 250  copies / mL. A negative result does not preclude SARS-CoV-2 infection  and should not be used as the sole basis for treatment or other  patient management decisions.  A negative result may occur with  improper specimen collection / handling,  submission of specimen other  than nasopharyngeal swab, presence of viral mutation(s) within the  areas targeted by this assay, and inadequate number of viral copies  (<250 copies / mL). A negative result must be combined with clinical  observations, patient history, and epidemiological information. If result is POSITIVE SARS-CoV-2 target nucleic acids are DETECTED. The SARS-CoV-2 RNA is generally detectable in upper and lower  respiratory specimens dur ing the acute phase of infection.  Positive  results are indicative of active infection with SARS-CoV-2.  Clinical  correlation with patient history and other diagnostic information is  necessary to determine patient infection status.  Positive results do  not rule out bacterial infection or co-infection with other viruses. If result is PRESUMPTIVE POSTIVE SARS-CoV-2 nucleic acids MAY BE PRESENT.   A presumptive positive result was obtained on the submitted specimen  and confirmed on repeat testing.  While 2019 novel coronavirus  (SARS-CoV-2) nucleic acids may be present in the submitted sample  additional confirmatory testing may be necessary for epidemiological  and / or clinical management purposes  to differentiate between  SARS-CoV-2 and other Sarbecovirus currently known to infect humans.  If clinically indicated additional testing with an alternate test  methodology [phone]) is advised. The SARS-CoV-2 RNA is generally  detectable in upper and lower respiratory sp ecimens during the acute  phase of infection. The expected result is Negative. Fact Sheet for Patients:  BoilerBrush.com.cy Fact Sheet for Healthcare Providers: https://pope.com/ This test is not yet approved or cleared by the [place] FDA and has been authorized for detection and/or diagnosis of SARS-CoV-2 by FDA under an Emergency Use Authorization (EUA).  This EUA will remain in effect (meaning this test can be  used) for the duration of the COVID-19 declaration under Section 564(b)(1) of the  Act, 21 U.S.C. section 360bbb-3(b)(1), unless the authorization is terminated or revoked sooner. Performed at [facility], [address], [place], [place] [address]     Blood Alcohol level:  Lab Results  Component Value Date   ETH <10 05/05/2019   ETH <10 11/01/2018    Metabolic Disorder Labs:  Lab Results  Component Value Date   HGBA1C 4.8 12/13/2017   MPG 91.06 12/13/2017   MPG 103 10/25/2009   No results found for: PROLACTIN Lab Results  Component Value Date   CHOL 198 12/13/2017   TRIG 132 12/13/2017   HDL 37 (L) 12/13/2017   CHOLHDL 5.4 12/13/2017   VLDL 26 12/13/2017   LDLCALC 135 (H) 12/13/2017   LDLCALC  10/25/2009    UNABLE TO CALCULATE IF TRIGLYCERIDE OVER 400 mg/dL        Total Cholesterol/HDL:CHD Risk Coronary Heart Disease Risk Table                     Men   Women  1/2 Average Risk   3.4   3.3  Average Risk       5.0   4.4  2 X Average Risk   9.6   7.1  3 X Average Risk  23.4   11.0        Use the calculated Patient Ratio above and the CHD Risk Table to determine the patient's CHD Risk.        ATP III CLASSIFICATION (LDL):  <100     mg/dL   Optimal  295-621  mg/dL   Near or Above                    Optimal  130-159  mg/dL   Borderline  308-657  mg/dL   High  >846     mg/dL   Very High    Current Medications: Current Facility-Administered Medications  Medication Dose Route Frequency Provider Last Rate Last Dose  . acetaminophen (TYLENOL) tablet 650 mg  650 mg Oral Q6H PRN Maryagnes Amos, FNP   650 mg at 05/06/19 1030  . alum & mag hydroxide-simeth (MAALOX/MYLANTA) 200-200-20 MG/5ML suspension 30 mL  30 mL Oral Q4H PRN Rosario Adie, Juel Burrow, FNP      . hydrOXYzine (ATARAX/VISTARIL) tablet 25 mg  25 mg Oral TID PRN Maryagnes Amos, FNP      . magnesium hydroxide (MILK OF MAGNESIA) suspension 30 mL  30 mL Oral Daily PRN  Rosario Adie, Juel Burrow, FNP      . potassium chloride SA (K-DUR) CR tablet 20 mEq  20 mEq Oral BID Maryagnes Amos, FNP   20 mEq at 05/06/19 1030   PTA Medications: Medications Prior to Admission  Medication Sig Dispense Refill Last Dose  . acetaminophen (TYLENOL) 500 MG tablet Take 1,000 mg by mouth every 6 (six) hours as needed for moderate pain.   Unknown at Unknown time  . albuterol (PROVENTIL HFA;VENTOLIN HFA) 108 (90 Base) MCG/ACT inhaler Inhale 2 puffs into the lungs every 6 (six) hours as needed for wheezing or shortness of breath. 1 Inhaler 2 Unknown at Unknown time  . BREO ELLIPTA 200-25 MCG/INH AEPB Inhale 1 puff into the lungs daily.    Unknown at Unknown time  . furosemide (LASIX) 40 MG tablet Take 1 tablet (40 mg total) by mouth daily. (Patient not taking: Reported on 12/05/2018) 30 tablet 11 Unknown at Unknown time  . gabapentin (NEURONTIN) 800 MG tablet Take 1 tablet (800  mg total) by mouth 3 (three) times daily. (Patient not taking: Reported on 12/05/2018) 90 tablet 2 Unknown at Unknown time  . hydrochlorothiazide (MICROZIDE) 12.5 MG capsule Take 12.5 mg by mouth daily.   Unknown at Unknown time  . metoprolol succinate (TOPROL-XL) 25 MG 24 hr tablet Take 25 mg by mouth daily.   Unknown at Unknown time  . OLANZapine (ZYPREXA) 20 MG tablet Take 1 tablet (20 mg total) by mouth at bedtime. 30 tablet 2 Unknown at Unknown time  . paliperidone (INVEGA SUSTENNA) 156 MG/ML SUSY injection Inject 156 mg into the muscle every 30 (thirty) days. On the 4th of each month   Unknown at Unknown time  . Rivaroxaban (XARELTO) 15 MG TABS tablet Take 1 tablet (15 mg total) by mouth 2 (two) times daily with a meal for 14 days. (Patient not taking: Reported on 05/05/2019) 28 tablet 0   . rivaroxaban (XARELTO) 20 MG TABS tablet Take 1 tablet (20 mg total) by mouth daily with supper. 30 tablet 2 Unknown at Unknown time  . sodium chloride (OCEAN) 0.65 % SOLN nasal spray Place 1 spray into both nostrils 2  (two) times daily as needed for congestion.   Unknown at Unknown time    Musculoskeletal: Strength & Muscle Tone: within normal limits Gait & Station: normal Patient leans: N/A  Psychiatric Specialty Exam: Physical Exam  Nursing note and vitals reviewed. Constitutional: He is oriented to person, place, and time. He appears well-nourished.  Neck: Normal range of motion.  Respiratory: Effort normal.  Musculoskeletal: Normal range of motion.  Neurological: He is alert and oriented to person, place, and time.  Skin: Skin is warm and dry.  Psychiatric: His behavior is normal. His mood appears anxious. Cognition and memory are normal. He expresses impulsivity. He exhibits a depressed mood. He expresses suicidal ideation.    Review of Systems  Psychiatric/Behavioral: Positive for depression, substance abuse and suicidal ideas. Hallucinations: Denies at this time. The patient is nervous/anxious.   All other systems reviewed and are negative.   Blood pressure 94/69, pulse 91, temperature 97.9 F (36.6 C), temperature source Tympanic, resp. rate 18, SpO2 99 %.There is no height or weight on file to calculate BMI.  General Appearance: Casual  Eye Contact:  Good  Speech:  Clear and Coherent and Normal Rate  Volume:  Normal  Mood:  Depressed  Affect:  Depressed  Thought Process:  Coherent and Goal Directed  Orientation:  Full (Time, Place, and Person)  Thought Content:  WDL  Denies hallucinations, paranoia, and delusions at this time  Suicidal Thoughts:  Reported suicidal ideation on admission; does not deny at this time but not stating a specific plan  Homicidal Thoughts:  No  Memory:  Immediate;   Good Recent;   Good  Judgement:  Fair  Insight:  Fair  Psychomotor Activity:  Normal  Concentration:  Concentration: Good and Attention Span: Good  Recall:  Good  Fund of Knowledge:  Fair  Language:  Good  Akathisia:  No  Handed:  Right  AIMS (if indicated):     Assets:  Communication  Skills Desire for Improvement Housing  ADL's:  Intact  Cognition:  WNL  Sleep:         Treatment Plan Summary: Plan Monitor in Observation for 24 hour;  Labs checked K+ level (potassium) 2.9; patient was ordered K-Dur 20 MEq Bid for 2 doses.  CMP ordered for tonight's labs to recheck K+ level. Possible discharged tomorrow morning:   Patient continues  to tolerated Prozac and K+ WNL.  Observation Level/Precautions:  15 minute checks Laboratory:  CBC Chemistry Profile UDS Psychotherapy:   Medications:  Continue home medications; added Prozac 20 mg daily for depression Consultations:  If needed Discharge Concerns:  Safety, stabilization, and access to medication Estimated LOS: 20 hours Other:      Assunta FoundShuvon Rankin, NP 8/9/202011:39 AM  Patient seen face-to-face for psychiatric evaluation, chart reviewed and case discussed with the physician extender and developed treatment plan. Reviewed the information documented and agree with the treatment plan. Thedore MinsMojeed Jakara Blatter, MD

## 2019-05-07 DIAGNOSIS — F251 Schizoaffective disorder, depressive type: Secondary | ICD-10-CM | POA: Diagnosis not present

## 2019-05-07 DIAGNOSIS — F1994 Other psychoactive substance use, unspecified with psychoactive substance-induced mood disorder: Secondary | ICD-10-CM | POA: Diagnosis not present

## 2019-05-07 LAB — POC OCCULT BLOOD, ED: Fecal Occult Bld: NEGATIVE

## 2019-05-07 MED ORDER — FLUOXETINE HCL 20 MG PO CAPS: 20.0000 mg | ORAL_CAPSULE | Freq: Every day | ORAL | 0 refills | Status: AC

## 2019-05-07 MED ORDER — FLUOXETINE HCL 20 MG PO CAPS
20.0000 mg | ORAL_CAPSULE | Freq: Every day | ORAL | Status: DC
  Administered 2019-05-07: 11:00:00 20 mg via ORAL
  Filled 2019-05-07: qty 1

## 2019-05-07 MED ORDER — HYDROXYZINE HCL 25 MG PO TABS: 25.0000 mg | ORAL_TABLET | Freq: Three times a day (TID) | ORAL | 0 refills | Status: DC | PRN

## 2019-05-07 NOTE — Progress Notes (Signed)
[place] NOVEL CORONAVIRUS (COVID-19) DAILY CHECK-OFF SYMPTOMS - answer yes or no to each - every day NO YES  Have you had a fever in the past 24 hours?  . Fever (Temp > 37.80C / 100F) X   Have you had any of these symptoms in the past 24 hours? . New Cough .  Sore Throat  .  Shortness of Breath .  Difficulty Breathing .  Unexplained Body Aches   X   Have you had any one of these symptoms in the past 24 hours not related to allergies?   . Runny Nose .  Nasal Congestion .  Sneezing   X   If you have had runny nose, nasal congestion, sneezing in the past 24 hours, has it worsened?  X   EXPOSURES - check yes or no X   Have you traveled outside the state in the past 14 days?  X   Have you been in contact with someone with a confirmed diagnosis of COVID-19 or PUI in the past 14 days without wearing appropriate PPE?  X   Have you been living in the same home as a person with confirmed diagnosis of COVID-19 or a PUI (household contact)?    X   Have you been diagnosed with COVID-19?    X              What to do next: Answered NO to all: Answered YES to anything:   Proceed with unit schedule Follow the [facility] Inpatient Flowsheet.   

## 2019-05-07 NOTE — Progress Notes (Signed)
D: Pt alert and oriented. Pt denies experiencing any SI/HI, or AVH at this time. Pt reports he will be able to keep himself safe when he returns home.  A: Pt received discharge and medication education/information. Pt belongings were returned and signed for at this time.   R: Pt verbalized understanding of discharge and medication education/information.  Pt escorted to obs lobby where his ride picked him up

## 2019-05-07 NOTE — BH Assessment (Signed)
[place] Assessment Progress Note  Per Hampton Abbot, MD, this pt does not require psychiatric hospitalization at this time.  Pt is to be discharged from the [facility] Observation Unit with recommendation to continue treatment with [facility].  This has been included in pt's discharge instructions.  Pt's nurse, Arlyss Repress, has been notified.  Jalene Mullet, [place] Triage Specialist [phone]

## 2019-05-07 NOTE — Progress Notes (Signed)
D: Pt alert and oriented. Pt mood/affect appears as depressed/flat. Pt verbally reports sleep last night as being good and as having a good appetite. Pt denies experiencing any SI/HI, or AVH at this time.   Pt forwards little information as to what brought him here to [facility] OBS.   Pt reports having chronic back pain in which 650 mg of Tylenol was given.  A: Support and encouragement provided. Frequent verbal contact made. Routine safety checks conducted q15 minutes.   R: No adverse drug reactions noted. Pt verbally contracts for safety at this time. Pt interacts well with others on the unit. Pt remains safe at this time. Will continue to monitor.

## 2019-05-07 NOTE — Discharge Instructions (Signed)
For your mental health needs, you are advised to continue treatment with [facility]: ° °     [facility] °     [address] °     [place],  [address] °     [phone]     Crisis number: [phone] °

## 2019-05-07 NOTE — Discharge Summary (Addendum)
[facility] Psych Observation Discharge  05/07/2019 11:09 AM FIN HUPP  MRN:  [id number] Principal Problem: Schizoaffective disorder, depressive type [facility]) Discharge Diagnoses: Principal Problem:   Schizoaffective disorder, depressive type ([place]) Active Problems:   Substance induced mood disorder (HCC)   Schizophrenia ([place])   Subjective: [name], 43 y.o., male patient seen face to face by this provider; and chart reviewed; consulted wit Dr. Dwyane Dee  on 05/07/19.  On evaluation [name] reports that he has been taking medication and no adverse reaction; Patient states that he has been sleeping and eating without difficulty.  Patient reporting that he is feeling better and denies suicidal/self-harm/homicidal ideation, psychosis, and paranoia.  Labs checked and K+ within normal limits.  During evaluation [name] is alert/oriented x 4; calm/cooperative; and mood is congruent with affect.  He does not appear to be responding to internal/external stimuli or delusional thoughts.  Patient denies suicidal/self-harm/homicidal ideation, psychosis, and paranoia.  Patient answered question appropriately.  Patient will keep all scheduled appointments with current outpatient psychiatric provider and follow up there.     Total Time spent with patient: 30 minutes  Past Psychiatric History: Schizoaffective disorder, Cocaine abuse; cocaine induced mood disorder, paranoid schizophrenia   Past Medical History:  Past Medical History:  Diagnosis Date  . Chronic back pain   . Cocaine use disorder ([place])   . COPD (chronic obstructive pulmonary disease) ([place])   . History of suicidal ideation   . Hypertension   . PE (pulmonary thromboembolism) ([place]) 10/2018   recurrent  . Schizophrenic disorder ([place])    History reviewed. No pertinent surgical history. Family History:  Family History  Problem Relation Age of Onset  . Cancer Mother   . Heart disease Father   . Drug abuse Brother    Family  Psychiatric  History: See above list Social History:  Social History   Substance and Sexual Activity  Alcohol Use No   Comment: seldom     Social History   Substance and Sexual Activity  Drug Use Yes  . Types: "Crack" cocaine, Cocaine    Social History   Socioeconomic History  . Marital status: Single    Spouse name: Not on file  . Number of children: Not on file  . Years of education: Not on file  . Highest education level: Not on file  Occupational History  . Not on file  Social Needs  . Financial resource strain: Not on file  . Food insecurity    Worry: Not on file    Inability: Not on file  . Transportation needs    Medical: Not on file    Non-medical: Not on file  Tobacco Use  . Smoking status: Current Every Day Smoker    Packs/day: 2.50    Years: 15.00    Pack years: 37.50    Types: Cigarettes  . Smokeless tobacco: Never Used  Substance and Sexual Activity  . Alcohol use: No    Comment: seldom  . Drug use: Yes    Types: "Crack" cocaine, Cocaine  . Sexual activity: Never  Lifestyle  . Physical activity    Days per week: Not on file    Minutes per session: Not on file  . Stress: Not on file  Relationships  . Social Herbalist on phone: Not on file    Gets together: Not on file    Attends religious service: Not on file    Active member of club or organization: Not  on file    Attends meetings of clubs or organizations: Not on file    Relationship status: Not on file  Other Topics Concern  . Not on file  Social History Narrative   ** Merged History Encounter **        Has this patient used any form of tobacco in the last 30 days? (Cigarettes, Smokeless Tobacco, Cigars, and/or Pipes) A prescription for an FDA-approved tobacco cessation medication was offered at discharge and the patient refused  Current Medications: Current Facility-Administered Medications  Medication Dose Route Frequency Provider Last Rate Last Dose  . acetaminophen  (TYLENOL) tablet 650 mg  650 mg Oral Q6H PRN Maryagnes AmosStarkes-Perry, Takia S, FNP   650 mg at 05/07/19 0857  . alum & mag hydroxide-simeth (MAALOX/MYLANTA) 200-200-20 MG/5ML suspension 30 mL  30 mL Oral Q4H PRN Rosario AdieStarkes-Perry, Juel Burrowakia S, FNP      . FLUoxetine (PROZAC) capsule 20 mg  20 mg Oral Daily Maxcine Strong B, NP      . hydrOXYzine (ATARAX/VISTARIL) tablet 25 mg  25 mg Oral TID PRN Maryagnes AmosStarkes-Perry, Takia S, FNP   25 mg at 05/06/19 2148  . magnesium hydroxide (MILK OF MAGNESIA) suspension 30 mL  30 mL Oral Daily PRN Maryagnes AmosStarkes-Perry, Takia S, FNP       PTA Medications: Medications Prior to Admission  Medication Sig Dispense Refill Last Dose  . acetaminophen (TYLENOL) 500 MG tablet Take 1,000 mg by mouth every 6 (six) hours as needed for moderate pain.   Unknown at Unknown time  . albuterol (PROVENTIL HFA;VENTOLIN HFA) 108 (90 Base) MCG/ACT inhaler Inhale 2 puffs into the lungs every 6 (six) hours as needed for wheezing or shortness of breath. 1 Inhaler 2 Unknown at Unknown time  . BREO ELLIPTA 200-25 MCG/INH AEPB Inhale 1 puff into the lungs daily.    Unknown at Unknown time  . furosemide (LASIX) 40 MG tablet Take 1 tablet (40 mg total) by mouth daily. (Patient not taking: Reported on 12/05/2018) 30 tablet 11 Unknown at Unknown time  . gabapentin (NEURONTIN) 800 MG tablet Take 1 tablet (800 mg total) by mouth 3 (three) times daily. (Patient not taking: Reported on 12/05/2018) 90 tablet 2 Unknown at Unknown time  . hydrochlorothiazide (MICROZIDE) 12.5 MG capsule Take 12.5 mg by mouth daily.   Unknown at Unknown time  . metoprolol succinate (TOPROL-XL) 25 MG 24 hr tablet Take 25 mg by mouth daily.   Unknown at Unknown time  . OLANZapine (ZYPREXA) 20 MG tablet Take 1 tablet (20 mg total) by mouth at bedtime. 30 tablet 2 Unknown at Unknown time  . paliperidone (INVEGA SUSTENNA) 156 MG/ML SUSY injection Inject 156 mg into the muscle every 30 (thirty) days. On the 4th of each month   Unknown at Unknown time  .  Rivaroxaban (XARELTO) 15 MG TABS tablet Take 1 tablet (15 mg total) by mouth 2 (two) times daily with a meal for 14 days. (Patient not taking: Reported on 05/05/2019) 28 tablet 0   . rivaroxaban (XARELTO) 20 MG TABS tablet Take 1 tablet (20 mg total) by mouth daily with supper. 30 tablet 2 Unknown at Unknown time  . sodium chloride (OCEAN) 0.65 % SOLN nasal spray Place 1 spray into both nostrils 2 (two) times daily as needed for congestion.   Unknown at Unknown time    Musculoskeletal: Strength & Muscle Tone: within normal limits Gait & Station: normal Patient leans: N/A  Psychiatric Specialty Exam: Physical Exam  Nursing note and vitals reviewed. Constitutional: He  is oriented to person, place, and time. He appears well-nourished. No distress.  Neck: Normal range of motion.  Respiratory: Effort normal.  Musculoskeletal: Normal range of motion.  Neurological: He is alert and oriented to person, place, and time.  Skin: Skin is warm and dry.  Psychiatric: His speech is normal and behavior is normal. Judgment and thought content normal. Anxious: Stable. Cognition and memory are normal. Depressed: Stable.    Review of Systems  Musculoskeletal: Back pain: Chronic.  Psychiatric/Behavioral: Positive for substance abuse. Depression: Stable. Hallucinations: Denies. Memory loss: Senies. Suicidal ideas: Denies. Nervous/anxious: Stable. Insomnia: Stable.   All other systems reviewed and are negative.   Blood pressure 103/76, pulse 98, temperature 98.5 F (36.9 C), temperature source Oral, resp. rate 18, SpO2 99 %.There is no height or weight on file to calculate BMI.  General Appearance: Casual  Eye Contact:  Good  Speech:  Clear and Coherent and Normal Rate  Volume:  Normal  Mood:  Appropriate  Affect:  Appropriate and Congruent  Thought Process:  Coherent, Goal Directed and Descriptions of Associations: Intact  Orientation:  Full (Time, Place, and Person)  Thought Content:  WDL  Suicidal  Thoughts:  No  Homicidal Thoughts:  No  Memory:  Immediate;   Good Recent;   Good  Judgement:  Intact  Insight:  Present  Psychomotor Activity:  Normal  Concentration:  Concentration: Good and Attention Span: Good  Recall:  Good  Fund of Knowledge:  Fair  Language:  Good  Akathisia:  No  Handed:  Right  AIMS (if indicated):     Assets:  Communication Skills Desire for Improvement Housing Social Support  ADL's:  Intact  Cognition:  WNL  Sleep:        Demographic Factors:  Male and Caucasian  Loss Factors: NA  Historical Factors: NA  Risk Reduction Factors:   Sense of responsibility to family, Living with another person, especially a relative, Positive social support and Positive therapeutic relationship  Continued Clinical Symptoms:  Alcohol/Substance Abuse/Dependencies Previous Psychiatric Diagnoses and Treatments  Cognitive Features That Contribute To Risk:  None    Suicide Risk:  Minimal: No identifiable suicidal ideation.  Patients presenting with no risk factors but with morbid ruminations; may be classified as minimal risk based on the severity of the depressive symptoms    Plan Of Care/Follow-up recommendations:  Activity:  As tolerated Diet:  Heart healthy Other:  Follow up with current outpatient psychiatric provider   Disposition: No evidence of imminent risk to self or others at present.   Patient does not meet criteria for psychiatric inpatient admission. Supportive therapy provided about ongoing stressors. Discussed crisis plan, support from social network, calling 911, coming to the Emergency Department, and calling Suicide Hotline.  Jakaleb Payer, NP 05/07/2019, 11:09 AM

## 2019-07-25 DIAGNOSIS — F250 Schizoaffective disorder, bipolar type: Secondary | ICD-10-CM | POA: Diagnosis not present

## 2019-09-13 IMAGING — CT CT ANGIOGRAPHY CHEST
2 of 7 series · 17 of 46 positions shown · IV contrast (omnipaque)
Comparison: 11/13/2018

CLINICAL DATA: Chest pain and lower limb swelling. History of blood
clots.

EXAM:
CT ANGIOGRAPHY CHEST WITH CONTRAST
TECHNIQUE: Multidetector CT imaging of the chest was performed using the
standard protocol during bolus administration of intravenous
contrast. Multiplanar CT image reconstructions and MIPs were
obtained to evaluate the vascular anatomy.
CONTRAST:  58mL OMNIPAQUE IOHEXOL 350 MG/ML SOLN

[Series 7: thins · axial · 0.78mm/px · z∈[+1349,+1598]mm · 14 of 401 slices shown]
[im 23/401  lung]
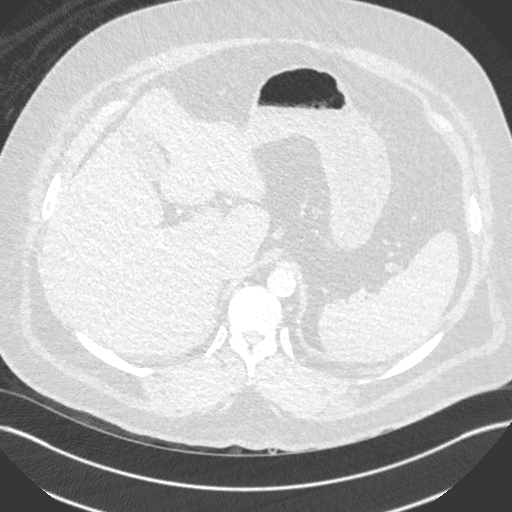
[im 45/401  soft-tissue]
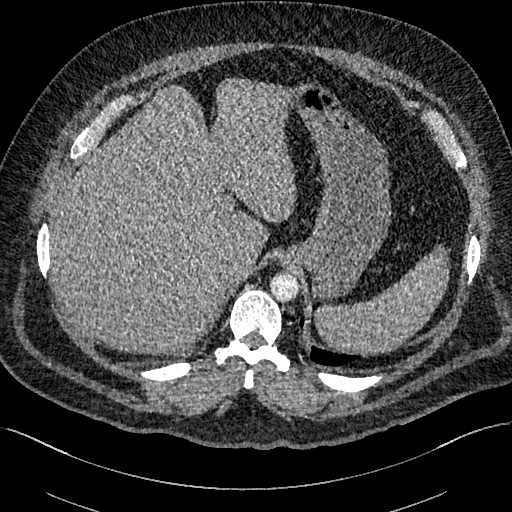
[im 89/401  lung]
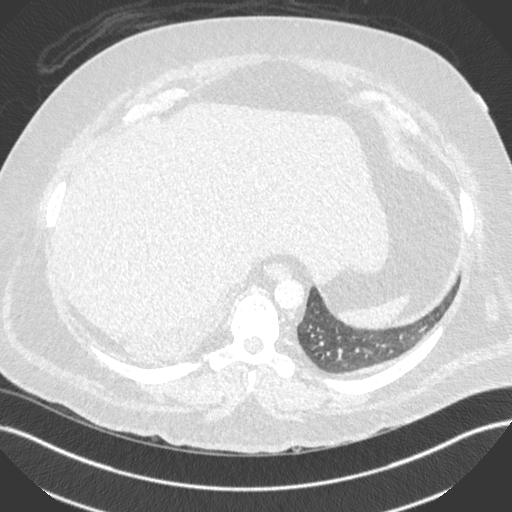
[im 112/401  soft-tissue]
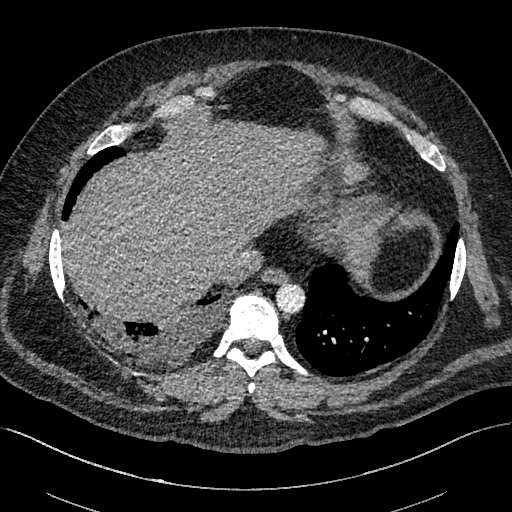
[im 134/401  lung]
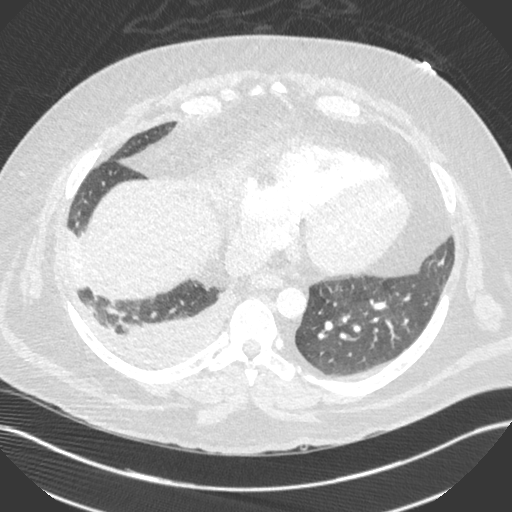
[im 156/401  soft-tissue]
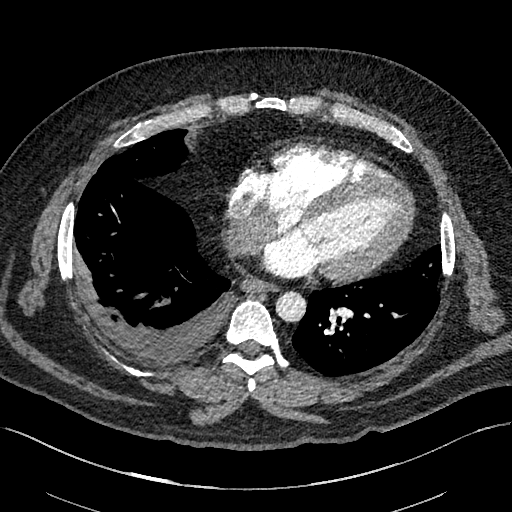
[im 178/401  lung]
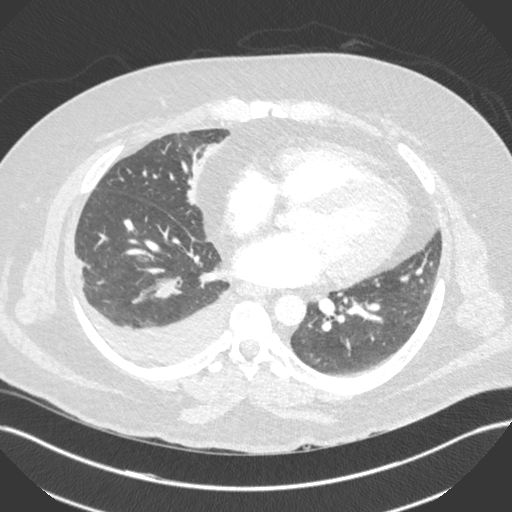
[im 223/401  soft-tissue]
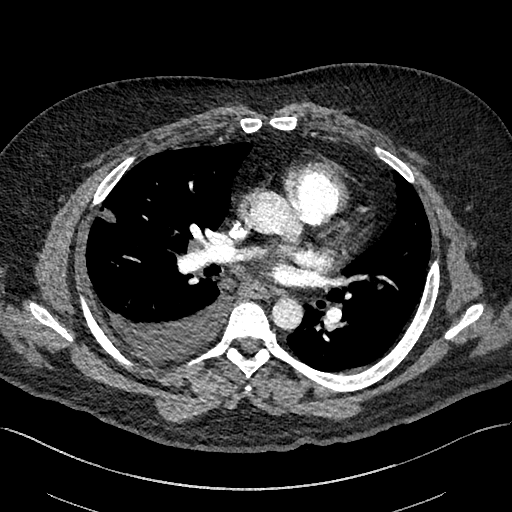
[im 245/401  lung]
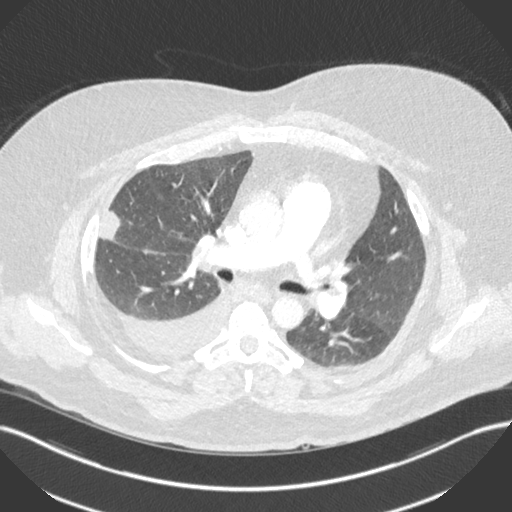
[im 267/401  soft-tissue]
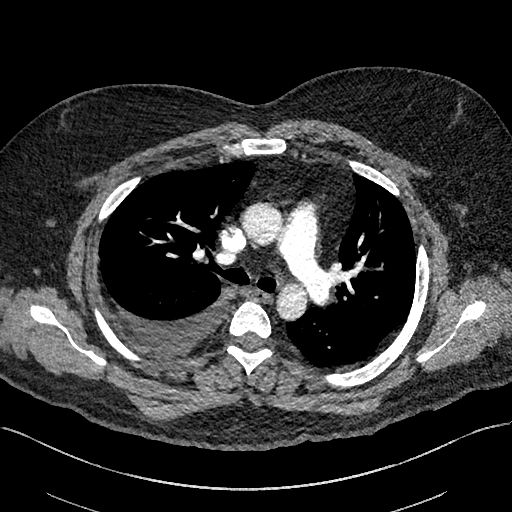
[im 289/401  lung]
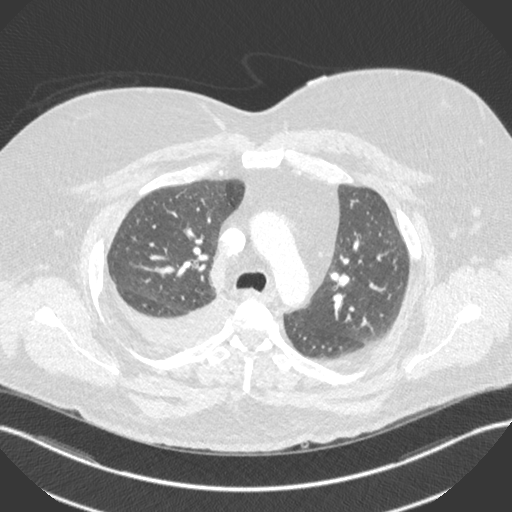
[im 312/401  soft-tissue]
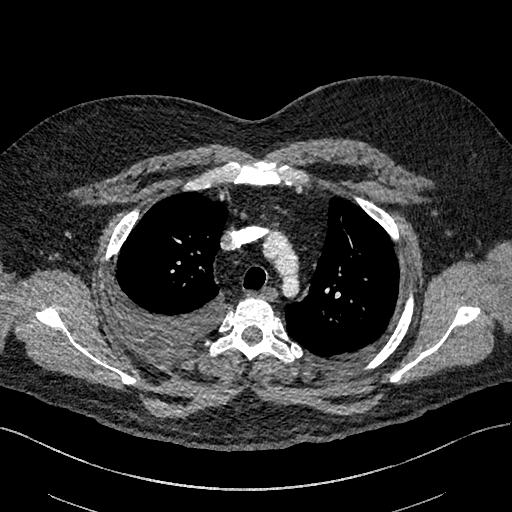
[im 356/401  lung]
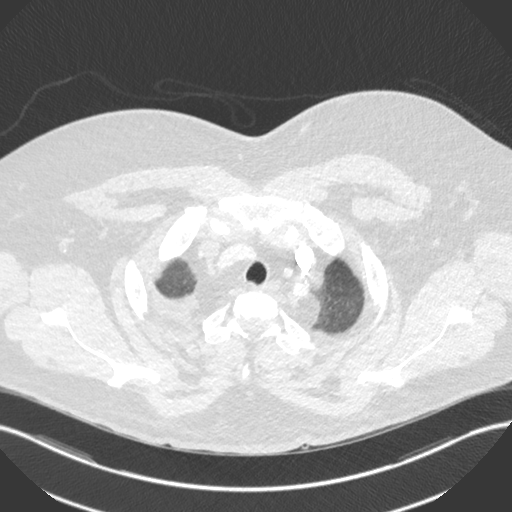
[im 378/401  soft-tissue]
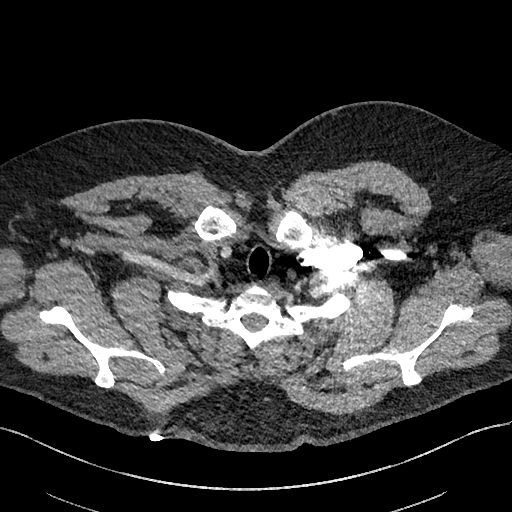

[Series 8: cor · coronal · 0.59mm/px · 3 of 166 slices shown]
[im 42/166  soft-tissue]
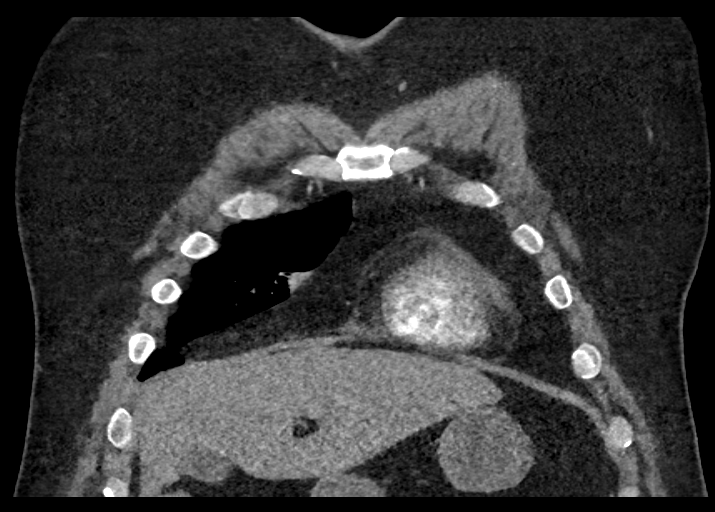
[im 83/166  soft-tissue]
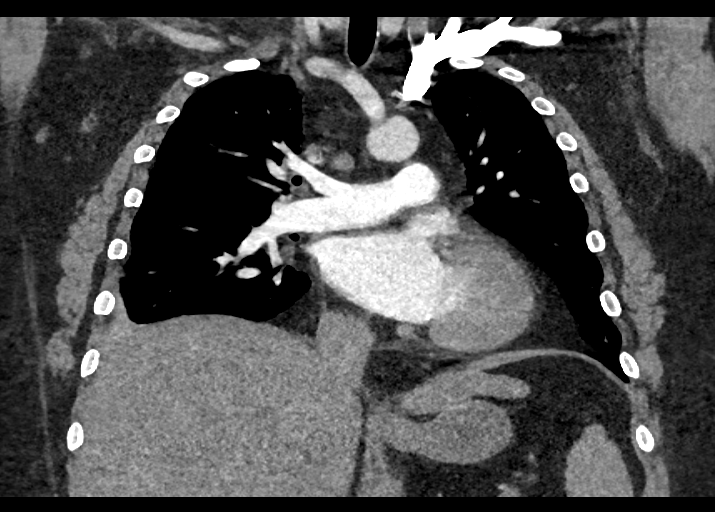
[im 124/166  soft-tissue]
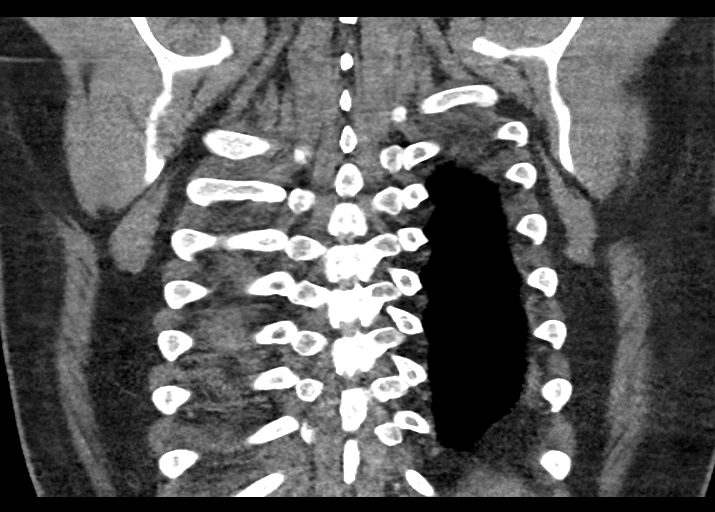

[17 of 46 positions shown; findings below may reference images not displayed]

FINDINGS: Cardiovascular: Good opacification of the central and segmental
pulmonary arteries. Filling defect in the right lower lobe posterior
pulmonary artery consistent with pulmonary embolus. This was present
in the same location on previous study, suggesting residual or
recurrent embolus. Other emboli seen previously have resolved and no
new emboli are demonstrated today. Normal heart size. No pericardial
effusions. Normal caliber thoracic aorta. Great vessel origins are
patent.

Mediastinum/Nodes: Scattered mediastinal lymph nodes are not
pathologically enlarged, likely reactive. Esophagus is decompressed.

Lungs/Pleura: Small to moderate right pleural effusion with basilar
atelectasis or consolidation. Focal peripheral nodular opacity in
the right middle lung laterally, measuring 1.4 x 2 cm. This is
decreasing in size since previous study and may represent a
resolving focal infarct. No pneumothorax.

Upper Abdomen: No acute abnormalities.

Musculoskeletal: No chest wall abnormality. No acute or significant
osseous findings.

Review of the MIP images confirms the above findings.
IMPRESSION: 1. Filling defect in the right lower lobe posterior pulmonary artery
consistent with residual or recurrent pulmonary embolus. Embolus was
present in this area on the prior study. Other emboli seen
previously have resolved. No new emboli are demonstrated.
2. Small to moderate right pleural effusion with basilar atelectasis
or consolidation, increasing since prior study.
3. Decreasing size of peripheral nodular opacity in the right middle
lung may represent resolving focal infarct.

## 2019-10-02 ENCOUNTER — Encounter (HOSPITAL_COMMUNITY): Payer: Self-pay | Admitting: *Deleted

## 2019-10-02 ENCOUNTER — Other Ambulatory Visit: Payer: Self-pay

## 2019-10-02 ENCOUNTER — Emergency Department (HOSPITAL_COMMUNITY)
Admission: EM | Admit: 2019-10-02 | Discharge: 2019-10-02 | Disposition: A | Discharge status: Home / Self Care | Payer: Medicare Other | Attending: Emergency Medicine | Admitting: Emergency Medicine

## 2019-10-02 DIAGNOSIS — Z20822 Contact with and (suspected) exposure to covid-19: Secondary | ICD-10-CM | POA: Diagnosis not present

## 2019-10-02 DIAGNOSIS — Z79899 Other long term (current) drug therapy: Secondary | ICD-10-CM | POA: Insufficient documentation

## 2019-10-02 DIAGNOSIS — Z03818 Encounter for observation for suspected exposure to other biological agents ruled out: Secondary | ICD-10-CM

## 2019-10-02 DIAGNOSIS — J449 Chronic obstructive pulmonary disease, unspecified: Secondary | ICD-10-CM | POA: Diagnosis not present

## 2019-10-02 DIAGNOSIS — F17210 Nicotine dependence, cigarettes, uncomplicated: Secondary | ICD-10-CM | POA: Insufficient documentation

## 2019-10-02 DIAGNOSIS — I10 Essential (primary) hypertension: Secondary | ICD-10-CM | POA: Insufficient documentation

## 2019-10-02 LAB — RESPIRATORY PANEL BY RT PCR (FLU A&B, COVID)
Influenza A by PCR: NEGATIVE
Influenza B by PCR: NEGATIVE
SARS Coronavirus 2 by RT PCR: NEGATIVE

## 2019-10-02 NOTE — ED Provider Notes (Signed)
[facility]-EMERGENCY DEPT Provider Note   CSN: [id number] Arrival date & time: 10/02/19  1452     History Chief Complaint  Patient presents with  . Covid test for Rehab    [name] is a 44 y.o. male.  Patient with history of substance abuse (cocaine). He has a bed at a local treatment facility [facility]), but needs a covid test before he can report to the facility. He states he feels otherwise well. Denies chest pain, shortness of breath, abdominal pain, cough, fever.  The history is provided by the patient. No language interpreter was used.  Drug Problem This is a chronic problem.       Past Medical History:  Diagnosis Date  . Chronic back pain   . Cocaine use disorder (HCC)   . COPD (chronic obstructive pulmonary disease) (HCC)   . History of suicidal ideation   . Hypertension   . PE (pulmonary thromboembolism) (HCC) 10/2018   recurrent  . Schizophrenic disorder [facility])     Patient Active Problem List   Diagnosis Date Noted  . Schizophrenia (HCC) 05/05/2019  . HCAP (healthcare-associated pneumonia) 11/28/2018  . Sepsis (HCC) 11/28/2018  . Acute respiratory failure with hypoxia (HCC) 11/13/2018  . Pulmonary emboli (HCC) 11/13/2018  . COPD exacerbation (HCC)   . Hypertension   . Chronic back pain   . Pulmonary embolism (HCC) 12/13/2017  . recurrent Pulmonary embolism, bilateral (HCC)  3/19 and 2/20   . Paranoid schizophrenia (HCC)   . Cocaine abuse with cocaine-induced mood disorder (HCC) 06/30/2017  . Substance induced mood disorder (HCC) 11/29/2015  . Homeless   . Suicidal ideation   . Schizoaffective disorder, depressive type (HCC)   . Schizoaffective disorder (HCC) 05/08/2013  . Cocaine abuse (HCC) 11/11/2011    History reviewed. No pertinent surgical history.     Family History  Problem Relation Age of Onset  . Cancer Mother   . Heart disease Father   . Drug abuse Brother     Social History   Tobacco Use  . Smoking  status: Current Every Day Smoker    Packs/day: 2.50    Years: 15.00    Pack years: 37.50    Types: Cigarettes  . Smokeless tobacco: Never Used  Substance Use Topics  . Alcohol use: No    Comment: seldom  . Drug use: Yes    Types: "Crack" cocaine, Cocaine    Home Medications Prior to Admission medications   Medication Sig Start Date End Date Taking? Authorizing Provider  acetaminophen (TYLENOL) 500 MG tablet Take 1,000 mg by mouth every 6 (six) hours as needed for moderate pain.    [provider]  albuterol (PROVENTIL HFA;VENTOLIN HFA) 108 (90 Base) MCG/ACT inhaler Inhale 2 puffs into the lungs every 6 (six) hours as needed for wheezing or shortness of breath. 11/20/18   Meredeth Ide, MD  BREO ELLIPTA 200-25 MCG/INH AEPB Inhale 1 puff into the lungs daily.  11/25/18   [provider]  FLUoxetine (PROZAC) 20 MG capsule Take 1 capsule (20 mg total) by mouth daily. 05/07/19   Rankin, Shuvon B, NP  hydrochlorothiazide (MICROZIDE) 12.5 MG capsule Take 12.5 mg by mouth daily. 11/25/18   [provider]  hydrOXYzine (ATARAX/VISTARIL) 25 MG tablet Take 1 tablet (25 mg total) by mouth 3 (three) times daily as needed for anxiety. 05/07/19   Rankin, Shuvon B, NP  metoprolol succinate (TOPROL-XL) 25 MG 24 hr tablet Take 25 mg by mouth daily. 11/25/18  [provider]  OLANZapine (ZYPREXA) 20 MG tablet Take 1 tablet (20 mg total) by mouth at bedtime. 11/20/18   Oswald Hillock, MD  paliperidone (INVEGA SUSTENNA) 156 MG/ML SUSY injection Inject 156 mg into the muscle every 30 (thirty) days. On the 4th of each month    [provider]  rivaroxaban (XARELTO) 20 MG TABS tablet Take 1 tablet (20 mg total) by mouth daily with supper. 12/05/18   Oswald Hillock, MD  sodium chloride (OCEAN) 0.65 % SOLN nasal spray Place 1 spray into both nostrils 2 (two) times daily as needed for congestion.    [provider]    Allergies    Haldol [haloperidol  decanoate]  Review of Systems   Review of Systems  All other systems reviewed and are negative.   Physical Exam Updated Vital Signs BP 123/86 (BP Location: Left Arm)   Pulse 100   Temp 97.8 F (36.6 C) (Oral)   Resp 18   Ht 6' (1.829 m)   Wt 136.1 kg   SpO2 99%   BMI 40.69 kg/m   Physical Exam Vitals and nursing note reviewed.  Constitutional:      Appearance: He is well-developed.  HENT:     Head: Normocephalic and atraumatic.  Eyes:     Conjunctiva/sclera: Conjunctivae normal.  Cardiovascular:     Rate and Rhythm: Normal rate and regular rhythm.     Heart sounds: No murmur.  Pulmonary:     Effort: Pulmonary effort is normal. No respiratory distress.     Breath sounds: Normal breath sounds.  Abdominal:     Palpations: Abdomen is soft.     Tenderness: There is no abdominal tenderness.  Musculoskeletal:     Cervical back: Neck supple.  Skin:    General: Skin is warm and dry.  Neurological:     Mental Status: He is alert and oriented to person, place, and time.  Psychiatric:        Mood and Affect: Mood normal.     ED Results / Procedures / Treatments   Labs (all labs ordered are listed, but only abnormal results are displayed) Labs Reviewed  RESPIRATORY PANEL BY RT PCR (FLU A&B, COVID)    EKG None  Radiology No results found.  Procedures Procedures (including critical care time)  Medications Ordered in ED Medications - No data to display  ED Course  I have reviewed the triage vital signs and the nursing notes.  Pertinent labs & imaging results that were available during my care of the patient were reviewed by me and considered in my medical decision making (see chart for details).    MDM Rules/Calculators/A&P                     Patient presents for COVID screen prior to reporting to [facility] for cocaine abuse treatment. Patient without other complaints. COVID screen negative. Patient may report to [facility] today as planned.   Final Clinical  Impression(s) / ED Diagnoses Final diagnoses:  Lab test negative for COVID-19 virus    Rx / DC Orders ED Discharge Orders    None       Etta Quill, NP 10/02/19 Erskin Burnet    Davonna Belling, MD 10/02/19 [phone]

## 2019-10-02 NOTE — Discharge Instructions (Signed)
Your covid test is negative. Please report to [facility] as planned.

## 2019-10-02 NOTE — ED Triage Notes (Signed)
[facility] sent pt here for Covid test so he can got [facility] for rehab. No symptoms

## 2019-10-27 DIAGNOSIS — I10 Essential (primary) hypertension: Secondary | ICD-10-CM | POA: Diagnosis not present

## 2019-10-27 DIAGNOSIS — J449 Chronic obstructive pulmonary disease, unspecified: Secondary | ICD-10-CM | POA: Diagnosis not present

## 2019-10-27 DIAGNOSIS — Z7901 Long term (current) use of anticoagulants: Secondary | ICD-10-CM | POA: Diagnosis not present

## 2019-10-27 DIAGNOSIS — G894 Chronic pain syndrome: Secondary | ICD-10-CM | POA: Diagnosis not present

## 2019-10-27 DIAGNOSIS — J9611 Chronic respiratory failure with hypoxia: Secondary | ICD-10-CM | POA: Diagnosis not present

## 2019-10-31 ENCOUNTER — Other Ambulatory Visit: Payer: Self-pay

## 2019-10-31 ENCOUNTER — Emergency Department (HOSPITAL_COMMUNITY)
Admission: EM | Admit: 2019-10-31 | Discharge: 2019-11-01 | Disposition: A | Discharge status: Other Acute Inpatient Hospital | Payer: Medicare Other | Attending: Emergency Medicine | Admitting: Emergency Medicine

## 2019-10-31 ENCOUNTER — Encounter (HOSPITAL_COMMUNITY): Payer: Self-pay | Admitting: Emergency Medicine

## 2019-10-31 DIAGNOSIS — F1410 Cocaine abuse, uncomplicated: Secondary | ICD-10-CM

## 2019-10-31 DIAGNOSIS — F329 Major depressive disorder, single episode, unspecified: Secondary | ICD-10-CM | POA: Diagnosis not present

## 2019-10-31 DIAGNOSIS — F1414 Cocaine abuse with cocaine-induced mood disorder: Secondary | ICD-10-CM | POA: Insufficient documentation

## 2019-10-31 DIAGNOSIS — R45851 Suicidal ideations: Secondary | ICD-10-CM | POA: Diagnosis not present

## 2019-10-31 DIAGNOSIS — G4700 Insomnia, unspecified: Secondary | ICD-10-CM | POA: Insufficient documentation

## 2019-10-31 DIAGNOSIS — R2243 Localized swelling, mass and lump, lower limb, bilateral: Secondary | ICD-10-CM | POA: Diagnosis not present

## 2019-10-31 DIAGNOSIS — Z79899 Other long term (current) drug therapy: Secondary | ICD-10-CM | POA: Diagnosis not present

## 2019-10-31 DIAGNOSIS — E876 Hypokalemia: Secondary | ICD-10-CM | POA: Insufficient documentation

## 2019-10-31 DIAGNOSIS — I10 Essential (primary) hypertension: Secondary | ICD-10-CM | POA: Diagnosis present

## 2019-10-31 DIAGNOSIS — J449 Chronic obstructive pulmonary disease, unspecified: Secondary | ICD-10-CM | POA: Diagnosis not present

## 2019-10-31 DIAGNOSIS — Z20822 Contact with and (suspected) exposure to covid-19: Secondary | ICD-10-CM | POA: Diagnosis not present

## 2019-10-31 DIAGNOSIS — Z7901 Long term (current) use of anticoagulants: Secondary | ICD-10-CM | POA: Diagnosis not present

## 2019-10-31 DIAGNOSIS — F17210 Nicotine dependence, cigarettes, uncomplicated: Secondary | ICD-10-CM | POA: Insufficient documentation

## 2019-10-31 LAB — COMPREHENSIVE METABOLIC PANEL
ALT: 17 U/L (ref 0–44)
AST: 19 U/L (ref 15–41)
Albumin: 4.2 g/dL (ref 3.5–5.0)
Alkaline Phosphatase: 102 U/L (ref 38–126)
Anion gap: 10 (ref 5–15)
BUN: 7 mg/dL (ref 6–20)
CO2: 30 mmol/L (ref 22–32)
Calcium: 9.2 mg/dL (ref 8.9–10.3)
Chloride: 99 mmol/L (ref 98–111)
Creatinine, Ser: 0.83 mg/dL (ref 0.61–1.24)
GFR calc Af Amer: >60 mL/min (ref >60)
GFR calc non Af Amer: >60 mL/min (ref >60)
Glucose, Bld: 124 mg/dL — ABNORMAL HIGH (ref 70–99)
Potassium: 3.2 mmol/L — ABNORMAL LOW (ref 3.5–5.1)
Sodium: 139 mmol/L (ref 135–145)
Total Bilirubin: 1 mg/dL (ref 0.3–1.2)
Total Protein: 7.3 g/dL (ref 6.5–8.1)

## 2019-10-31 LAB — CBC
HCT: 45.4 % (ref 39.0–52.0)
Hemoglobin: 15.1 g/dL (ref 13.0–17.0)
MCH: 31.4 pg (ref 26.0–34.0)
MCHC: 33.3 g/dL (ref 30.0–36.0)
MCV: 94.4 fL (ref 80.0–100.0)
Platelets: 207 10*3/uL (ref 150–400)
RBC: 4.81 MIL/uL (ref 4.22–5.81)
RDW: 13.1 % (ref 11.5–15.5)
WBC: 10.9 10*3/uL — ABNORMAL HIGH (ref 4.0–10.5)
nRBC: 0 % (ref 0.0–0.2)

## 2019-10-31 LAB — RAPID URINE DRUG SCREEN, HOSP PERFORMED
Amphetamines: NOT DETECTED
Barbiturates: NOT DETECTED
Benzodiazepines: NOT DETECTED
Cocaine: POSITIVE — AB
Opiates: NOT DETECTED
Tetrahydrocannabinol: NOT DETECTED

## 2019-10-31 LAB — RESPIRATORY PANEL BY RT PCR (FLU A&B, COVID)
Influenza A by PCR: NEGATIVE
Influenza B by PCR: NEGATIVE
SARS Coronavirus 2 by RT PCR: NEGATIVE

## 2019-10-31 LAB — ACETAMINOPHEN LEVEL: Acetaminophen (Tylenol), Serum: <10 ug/mL — ABNORMAL LOW (ref 10–30)

## 2019-10-31 LAB — SALICYLATE LEVEL: Salicylate Lvl: <7 mg/dL — ABNORMAL LOW (ref 7.0–30.0)

## 2019-10-31 LAB — ETHANOL: Alcohol, Ethyl (B): <10 mg/dL (ref <10)

## 2019-10-31 MED ORDER — FLUTICASONE FUROATE-VILANTEROL 200-25 MCG/INH IN AEPB
1.0000 | INHALATION_SPRAY | Freq: Every day | RESPIRATORY_TRACT | Status: DC
  Administered 2019-11-01: 1 via RESPIRATORY_TRACT
  Filled 2019-10-31: qty 28

## 2019-10-31 MED ORDER — ACETAMINOPHEN 500 MG PO TABS: 1000.0000 mg | ORAL_TABLET | Freq: Four times a day (QID) | ORAL | Status: DC | PRN

## 2019-10-31 MED ORDER — FLUOXETINE HCL 20 MG PO CAPS
20.0000 mg | ORAL_CAPSULE | Freq: Every day | ORAL | Status: DC
  Administered 2019-10-31: 18:00:00 20 mg via ORAL
  Filled 2019-10-31: qty 1

## 2019-10-31 MED ORDER — METOPROLOL SUCCINATE ER 25 MG PO TB24
25.0000 mg | ORAL_TABLET | Freq: Every day | ORAL | Status: DC
  Administered 2019-10-31: 25 mg via ORAL
  Filled 2019-10-31 (×2): qty 1

## 2019-10-31 MED ORDER — FLUTICASONE FUROATE-VILANTEROL 200-25 MCG/INH IN AEPB: 1.0000 | INHALATION_SPRAY | Freq: Every day | RESPIRATORY_TRACT | Status: DC

## 2019-10-31 MED ORDER — ALBUTEROL SULFATE HFA 108 (90 BASE) MCG/ACT IN AERS: 2.0000 | INHALATION_SPRAY | Freq: Four times a day (QID) | RESPIRATORY_TRACT | Status: DC | PRN

## 2019-10-31 MED ORDER — FLUTICASONE FUROATE-VILANTEROL 200-25 MCG/INH IN AEPB
1.0000 | INHALATION_SPRAY | Freq: Every day | RESPIRATORY_TRACT | Status: DC
  Filled 2019-10-31: qty 28

## 2019-10-31 MED ORDER — OLANZAPINE 10 MG PO TABS: 20.0000 mg | ORAL_TABLET | Freq: Every day | ORAL | Status: DC

## 2019-10-31 MED ORDER — HYDROXYZINE HCL 25 MG PO TABS: 25.0000 mg | ORAL_TABLET | Freq: Three times a day (TID) | ORAL | Status: DC | PRN

## 2019-10-31 MED ORDER — RIVAROXABAN 20 MG PO TABS
20.0000 mg | ORAL_TABLET | Freq: Every day | ORAL | Status: DC
  Administered 2019-10-31: 18:00:00 20 mg via ORAL
  Filled 2019-10-31: qty 1

## 2019-10-31 MED ORDER — POTASSIUM CHLORIDE CRYS ER 20 MEQ PO TBCR
40.0000 meq | EXTENDED_RELEASE_TABLET | Freq: Every day | ORAL | Status: DC
  Administered 2019-10-31: 18:00:00 40 meq via ORAL
  Filled 2019-10-31: qty 2

## 2019-10-31 MED ORDER — HYDROCHLOROTHIAZIDE 12.5 MG PO CAPS
12.5000 mg | ORAL_CAPSULE | Freq: Every day | ORAL | Status: DC
  Administered 2019-10-31: 18:00:00 12.5 mg via ORAL
  Filled 2019-10-31: qty 1

## 2019-10-31 NOTE — ED Triage Notes (Signed)
Patient complains of SI, denies HI. States he also has hallucinations, he sees dinosaurs. Says that he has a history of schizophrenia, and takes his medication as prescribed. Says he wants to end his life, but has not tried to.

## 2019-10-31 NOTE — BH Assessment (Addendum)
Tele Assessment Note   Patient Name: [name] MRN: [id number] Referring Physician: Dr. Davonna Belling Location of Patient: [facility] Location of Provider: [facility] is an 44 y.o. male presenting with SI with plan to cut himself or jump off a bridge. Patient reported onset of "all day today starting at 1pm". Patient reported trigger was watching his mothers death in 01/28/18 from brain cancer. Patient reported hallucinations of seeing cartoons and dinosaurs. Patient reported he is taking medications but unable to name his psychiatrist, stating he has a "house doctor named Dr. Vanessa Ralphs" that is over the house where he rents a room. Patient reported receiving no other outpatient mental health services at this time. Patient admitted to cocaine usage and that he relapsed 1 month ago after being clean for 1 year. Patient reported history of 1x suicide attempt. Patient reported inpatient mental health 04/2019 and 06/2019. Patient denied HI. Patient reported 8 hrs sleep and poor appetite. Patient was calm and cooperative during assessment. Patient requesting inpatient treatment.   Diagnosis: Major depressive disorder  Past Medical History:  Past Medical History:  Diagnosis Date  . Chronic back pain   . Cocaine use disorder ([facility])   . COPD (chronic obstructive pulmonary disease) ([place])   . History of suicidal ideation   . Hypertension   . PE (pulmonary thromboembolism) ([place]) 10/2018   recurrent  . Schizophrenic disorder ([place])     History reviewed. No pertinent surgical history.  Family History:  Family History  Problem Relation Age of Onset  . Cancer Mother   . Heart disease Father   . Drug abuse Brother     Social History:  reports that he has been smoking cigarettes. He has a 37.50 pack-year smoking history. He has never used smokeless tobacco. He reports current drug use. Drugs: "Crack" cocaine and Cocaine. He reports that he does not drink  alcohol.  Additional Social History:  Alcohol / Drug Use Pain Medications: see MAR Prescriptions: see MAR Over the Counter: see MAR  CIWA: CIWA-Ar BP: (!) 148/87 Pulse Rate: 95 COWS:    Allergies:  Allergies  Allergen Reactions  . Haldol [Haloperidol Decanoate] Other (See Comments)    'I curl up"    Home Medications: (Not in a hospital admission)   OB/GYN Status:  No LMP for male patient.  General Assessment Data Assessment unable to be completed: Yes Reason for not completing assessment: multiple walk-ins at [facility] Location of Assessment: [facility] TTS Assessment: In system Is this a Tele or Face-to-Face Assessment?: Tele Assessment Is this an Initial Assessment or a Re-assessment for this encounter?: Initial Assessment Patient Accompanied by:: N/A Language Other than English: No Living Arrangements: (alone) What gender do you identify as?: Male Marital status: Single Living Arrangements: Non-relatives/Friends(room for rent) Can pt return to current living arrangement?: Yes Admission Status: Voluntary Is patient capable of signing voluntary admission?: Yes Referral Source: Self/Family/Friend     Crisis Care Plan Living Arrangements: Non-relatives/Friends(room for rent) Legal Guardian: (self) Name of Psychiatrist: (none) Name of Therapist: (none)  Education Status Is patient currently in school?: No Is the patient employed, unemployed or receiving disability?: Unemployed  Risk to self with the past 6 months Suicidal Ideation: Yes-Currently Present Has patient been a risk to self within the past 6 months prior to admission? : Yes Suicidal Intent: Yes-Currently Present Has patient had any suicidal intent within the past 6 months prior to admission? : Yes Is patient at risk for suicide?: Yes Suicidal  Plan?: Yes-Currently Present Has patient had any suicidal plan within the past 6 months prior to admission? : Yes Specify Current Suicidal Plan: (cut wrist of jump off  bridge) Access to Means: Yes Specify Access to Suicidal Means: (cut wrist or jump off bridge) What has been your use of drugs/alcohol within the last 12 months?: (+cocaine) Previous Attempts/Gestures: Yes How many times?: (1) Other Self Harm Risks: (none) Triggers for Past Attempts: Unknown Intentional Self Injurious Behavior: None Family Suicide History: No Recent stressful life event(s): Loss (Comment)(grief/loss mothers death 2018/01/10) Persecutory voices/beliefs?: No Depression: Yes Depression Symptoms: Insomnia, Guilt, Fatigue, Loss of interest in usual pleasures, Feeling worthless/self pity, Tearfulness Substance abuse history and/or treatment for substance abuse?: No Suicide prevention information given to non-admitted patients: Not applicable  Risk to Others within the past 6 months Homicidal Ideation: No Does patient have any lifetime risk of violence toward others beyond the six months prior to admission? : No Thoughts of Harm to Others: No Current Homicidal Intent: No Current Homicidal Plan: No Access to Homicidal Means: No History of harm to others?: No Assessment of Violence: None Noted Violent Behavior Description: (none reported) Does patient have access to weapons?: No Criminal Charges Pending?: No Does patient have a court date: No Is patient on probation?: No  Psychosis Hallucinations: Auditory, Visual(cartoons and dinosaurs) Delusions: Unspecified  Mental Status Report Appearance/Hygiene: Unremarkable Eye Contact: Fair Motor Activity: Freedom of movement Speech: Logical/coherent Level of Consciousness: Alert Mood: Depressed Affect: Depressed, Appropriate to circumstance Anxiety Level: Moderate Thought Processes: Relevant Judgement: Impaired Orientation: Person, Place, Time, Situation Obsessive Compulsive Thoughts/Behaviors: None  Cognitive Functioning Concentration: Fair Memory: Recent Intact Is patient IDD: No Insight: Poor Impulse Control:  Poor Appetite: Good Have you had any weight changes? : No Change Sleep: No Change Total Hours of Sleep: (8) Vegetative Symptoms: None  ADLScreening [facility] Assessment Services) Patient's cognitive ability adequate to safely complete daily activities?: Yes Patient able to express need for assistance with ADLs?: Yes Independently performs ADLs?: Yes (appropriate for developmental age)  Prior Inpatient Therapy Prior Inpatient Therapy: Yes Prior Therapy Dates: 01-11-2019) Prior Therapy Facilty/Provider(s): ([facility]) Reason for Treatment: (psych)  Prior Outpatient Therapy Prior Outpatient Therapy: No Does patient have an ACCT team?: No Does patient have Intensive In-House Services?  : No Does patient have [facility] services? : No Does patient have P4CC services?: No  ADL Screening (condition at time of admission) Patient's cognitive ability adequate to safely complete daily activities?: Yes Patient able to express need for assistance with ADLs?: Yes Independently performs ADLs?: Yes (appropriate for developmental age)   [employer]chant [employer] officer (For Healthcare) Does Patient Have a Medical Advance Directive?: No   Disposition:  Disposition Initial Assessment Completed for this Encounter: Yes  Sherron Flemings, NP, patient meets inpatient criteria. TTS to secure placement.   This service was provided via telemedicine using a 2-way, interactive audio and video technology.  Names of all persons participating in this telemedicine service and their role in this encounter. Name: Lani Mendiola Role: Patient  Name: Al Corpus Role: TTS Clinician  Name:  Role:   Name:  Role:     Burnetta Sabin 10/31/2019 11:12 PM

## 2019-10-31 NOTE — ED Provider Notes (Signed)
[facility]-EMERGENCY DEPT Provider Note   CSN: [id number] Arrival date & time: 10/31/19  1445     History Chief Complaint  Patient presents with  . Suicidal  . Groin Swelling    [name] is a 44 y.o. male.  HPI Patient presents suicidal.  States he wants to kill himself.  States he was going to jump off a bridge but came here because it was closer.  History of schizophrenia states been taking his medicines.  States he does have some increased swelling on his legs.  States he relapsed using cocaine also.  States has had some hallucinations.  Denies alcohol use.  Denies current suicide attempt.    Past Medical History:  Diagnosis Date  . Chronic back pain   . Cocaine use disorder ([facility])   . COPD (chronic obstructive pulmonary disease) ([facility])   . History of suicidal ideation   . Hypertension   . PE (pulmonary thromboembolism) ([facility]) 10/2018   recurrent  . Schizophrenic disorder [facility])     Patient Active Problem List   Diagnosis Date Noted  . Schizophrenia (HCC) 05/05/2019  . HCAP (healthcare-associated pneumonia) 11/28/2018  . Sepsis (HCC) 11/28/2018  . Acute respiratory failure with hypoxia (HCC) 11/13/2018  . Pulmonary emboli (HCC) 11/13/2018  . COPD exacerbation (HCC)   . Hypertension   . Chronic back pain   . Pulmonary embolism (HCC) 12/13/2017  . recurrent Pulmonary embolism, bilateral (HCC)  3/19 and 2/20   . Paranoid schizophrenia (HCC)   . Cocaine abuse with cocaine-induced mood disorder (HCC) 06/30/2017  . Substance induced mood disorder (HCC) 11/29/2015  . Homeless   . Suicidal ideation   . Schizoaffective disorder, depressive type (HCC)   . Schizoaffective disorder (HCC) 05/08/2013  . Cocaine abuse (HCC) 11/11/2011    History reviewed. No pertinent surgical history.     Family History  Problem Relation Age of Onset  . Cancer Mother   . Heart disease Father   . Drug abuse Brother     Social History   Tobacco Use  .  Smoking status: Current Every Day Smoker    Packs/day: 2.50    Years: 15.00    Pack years: 37.50    Types: Cigarettes  . Smokeless tobacco: Never Used  Substance Use Topics  . Alcohol use: No    Comment: seldom  . Drug use: Yes    Types: "Crack" cocaine, Cocaine    Home Medications Prior to Admission medications   Medication Sig Start Date End Date Taking? Authorizing Provider  acetaminophen (TYLENOL) 500 MG tablet Take 1,000 mg by mouth every 6 (six) hours as needed for moderate pain.    [provider]  albuterol (PROVENTIL HFA;VENTOLIN HFA) 108 (90 Base) MCG/ACT inhaler Inhale 2 puffs into the lungs every 6 (six) hours as needed for wheezing or shortness of breath. 11/20/18   Meredeth Ide, MD  BREO ELLIPTA 200-25 MCG/INH AEPB Inhale 1 puff into the lungs daily.  11/25/18   [provider]  FLUoxetine (PROZAC) 20 MG capsule Take 1 capsule (20 mg total) by mouth daily. 05/07/19   Rankin, Shuvon B, NP  hydrochlorothiazide (MICROZIDE) 12.5 MG capsule Take 12.5 mg by mouth daily. 11/25/18   [provider]  hydrOXYzine (ATARAX/VISTARIL) 25 MG tablet Take 1 tablet (25 mg total) by mouth 3 (three) times daily as needed for anxiety. 05/07/19   Rankin, Shuvon B, NP  metoprolol succinate (TOPROL-XL) 25 MG 24 hr tablet Take 25 mg by mouth daily.  11/25/18   [provider]  OLANZapine (ZYPREXA) 20 MG tablet Take 1 tablet (20 mg total) by mouth at bedtime. 11/20/18   Oswald Hillock, MD  paliperidone (INVEGA SUSTENNA) 156 MG/ML SUSY injection Inject 156 mg into the muscle every 30 (thirty) days. On the 4th of each month    [provider]  rivaroxaban (XARELTO) 20 MG TABS tablet Take 1 tablet (20 mg total) by mouth daily with supper. 12/05/18   Oswald Hillock, MD  sodium chloride (OCEAN) 0.65 % SOLN nasal spray Place 1 spray into both nostrils 2 (two) times daily as needed for congestion.    [provider]    Allergies    Haldol [haloperidol  decanoate]  Review of Systems   Review of Systems  Constitutional: Negative for appetite change.  HENT: Negative for congestion.   Respiratory: Negative for shortness of breath.   Cardiovascular: Positive for leg swelling. Negative for chest pain.  Gastrointestinal: Negative for abdominal pain.  Genitourinary: Negative for flank pain.  Musculoskeletal: Negative for back pain.  Skin: Negative for rash.  Neurological: Negative for weakness.  Psychiatric/Behavioral: Positive for hallucinations and suicidal ideas. Negative for confusion.    Physical Exam Updated Vital Signs BP (!) 148/87   Pulse 95   Temp 97.8 F (36.6 C) (Oral)   Resp 17   Ht 6' (1.829 m)   Wt 136.1 kg   SpO2 98%   BMI 40.69 kg/m   Physical Exam Vitals and nursing note reviewed.  HENT:     Head: Normocephalic.  Eyes:     Pupils: Pupils are equal, round, and reactive to light.  Cardiovascular:     Rate and Rhythm: Regular rhythm.  Pulmonary:     Breath sounds: No rhonchi.  Abdominal:     Tenderness: There is no abdominal tenderness.  Musculoskeletal:     Cervical back: Neck supple.     Right lower leg: Edema present.     Left lower leg: Edema present.     Comments: Mild edema bilateral lower extremities  Skin:    General: Skin is warm.  Neurological:     Mental Status: He is alert. Mental status is at baseline.     ED Results / Procedures / Treatments   Labs (all labs ordered are listed, but only abnormal results are displayed) Labs Reviewed  COMPREHENSIVE METABOLIC PANEL - Abnormal; Notable for the following components:      Result Value   Potassium 3.2 (*)    Glucose, Bld 124 (*)    All other components within normal limits  SALICYLATE LEVEL - Abnormal; Notable for the following components:   Salicylate Lvl <3.2 (*)    All other components within normal limits  ACETAMINOPHEN LEVEL - Abnormal; Notable for the following components:   Acetaminophen (Tylenol), Serum <10 (*)    All other  components within normal limits  CBC - Abnormal; Notable for the following components:   WBC 10.9 (*)    All other components within normal limits  RAPID URINE DRUG SCREEN, HOSP PERFORMED - Abnormal; Notable for the following components:   Cocaine POSITIVE (*)    All other components within normal limits  RESPIRATORY PANEL BY RT PCR (FLU A&B, COVID)  ETHANOL    EKG None  Radiology No results found.  Procedures Procedures (including critical care time)  Medications Ordered in ED Medications  potassium chloride SA (KLOR-CON) CR tablet 40 mEq (has no administration in time range)  acetaminophen (TYLENOL) tablet 1,000 mg (  has no administration in time range)  albuterol (VENTOLIN HFA) 108 (90 Base) MCG/ACT inhaler 2 puff (has no administration in time range)  fluticasone furoate-vilanterol (BREO ELLIPTA) 200-25 MCG/INH 1 puff (has no administration in time range)  FLUoxetine (PROZAC) capsule 20 mg (has no administration in time range)  hydrochlorothiazide (MICROZIDE) capsule 12.5 mg (has no administration in time range)  hydrOXYzine (ATARAX/VISTARIL) tablet 25 mg (has no administration in time range)  metoprolol succinate (TOPROL-XL) 24 hr tablet 25 mg (has no administration in time range)  OLANZapine (ZYPREXA) tablet 20 mg (has no administration in time range)  rivaroxaban (XARELTO) tablet 20 mg (has no administration in time range)    ED Course  I have reviewed the triage vital signs and the nursing notes.  Pertinent labs & imaging results that were available during my care of the patient were reviewed by me and considered in my medical decision making (see chart for details).    MDM Rules/Calculators/A&P                      Patient presents suicidal.  History of same.  Also cocaine abuse.  Last used a day or 2 ago. Patient now medically cleared.  Mild hypokalemia orally supplemented.  Will start on home medicines.  To be seen by TTS Final Clinical Impression(s) / ED  Diagnoses Final diagnoses:  Cocaine abuse (HCC)  Suicidal ideation    Rx / DC Orders ED Discharge Orders    None       Benjiman Core, MD 10/31/19 [phone]

## 2019-11-01 ENCOUNTER — Encounter (HOSPITAL_COMMUNITY): Payer: Self-pay | Admitting: Psychiatry

## 2019-11-01 ENCOUNTER — Observation Stay (HOSPITAL_BASED_OUTPATIENT_CLINIC_OR_DEPARTMENT_OTHER)
Admission: AD | Admit: 2019-11-01 | Discharge: 2019-11-02 | Disposition: A | Discharge status: Home / Self Care | Payer: Medicare Other | Source: Intra-hospital | Attending: Psychiatry | Admitting: Psychiatry

## 2019-11-01 DIAGNOSIS — F329 Major depressive disorder, single episode, unspecified: Secondary | ICD-10-CM | POA: Diagnosis present

## 2019-11-01 DIAGNOSIS — F1410 Cocaine abuse, uncomplicated: Secondary | ICD-10-CM | POA: Diagnosis not present

## 2019-11-01 DIAGNOSIS — F259 Schizoaffective disorder, unspecified: Secondary | ICD-10-CM | POA: Diagnosis present

## 2019-11-01 MED ORDER — PALIPERIDONE PALMITATE ER 156 MG/ML IM SUSY
156.0000 mg | PREFILLED_SYRINGE | INTRAMUSCULAR | Status: DC
  Filled 2019-11-01: qty 1

## 2019-11-01 MED ORDER — OLANZAPINE 10 MG PO TABS
20.0000 mg | ORAL_TABLET | Freq: Every day | ORAL | Status: DC
  Administered 2019-11-01: 22:00:00 20 mg via ORAL
  Filled 2019-11-01: qty 2

## 2019-11-01 MED ORDER — BENZTROPINE MESYLATE 0.5 MG PO TABS
0.5000 mg | ORAL_TABLET | Freq: Two times a day (BID) | ORAL | Status: DC
  Administered 2019-11-01 – 2019-11-02 (×2): 0.5 mg via ORAL
  Filled 2019-11-01 (×2): qty 1

## 2019-11-01 MED ORDER — ALUM & MAG HYDROXIDE-SIMETH 200-200-20 MG/5ML PO SUSP: 30.0000 mL | ORAL | Status: DC | PRN

## 2019-11-01 MED ORDER — FLUOXETINE HCL 20 MG PO CAPS
20.0000 mg | ORAL_CAPSULE | Freq: Every day | ORAL | Status: DC
  Administered 2019-11-02: 09:00:00 20 mg via ORAL
  Filled 2019-11-01: qty 1

## 2019-11-01 MED ORDER — TEMAZEPAM 15 MG PO CAPS
30.0000 mg | ORAL_CAPSULE | Freq: Every day | ORAL | Status: DC
  Administered 2019-11-01: 30 mg via ORAL
  Filled 2019-11-01: qty 2

## 2019-11-01 MED ORDER — GABAPENTIN 400 MG PO CAPS: 400.0000 mg | ORAL_CAPSULE | Freq: Three times a day (TID) | ORAL | Status: DC

## 2019-11-01 MED ORDER — PERPHENAZINE 2 MG PO TABS
4.0000 mg | ORAL_TABLET | Freq: Two times a day (BID) | ORAL | Status: DC
  Administered 2019-11-01: 21:00:00 4 mg via ORAL
  Filled 2019-11-01: qty 2

## 2019-11-01 MED ORDER — GABAPENTIN 300 MG PO CAPS
300.0000 mg | ORAL_CAPSULE | Freq: Three times a day (TID) | ORAL | Status: DC
  Administered 2019-11-01 – 2019-11-02 (×2): 300 mg via ORAL
  Filled 2019-11-01 (×2): qty 1

## 2019-11-01 MED ORDER — RIVAROXABAN 20 MG PO TABS
20.0000 mg | ORAL_TABLET | Freq: Every day | ORAL | Status: DC
  Administered 2019-11-01: 21:00:00 20 mg via ORAL
  Filled 2019-11-01 (×3): qty 1

## 2019-11-01 MED ORDER — POTASSIUM CHLORIDE CRYS ER 20 MEQ PO TBCR
40.0000 meq | EXTENDED_RELEASE_TABLET | Freq: Every day | ORAL | Status: DC
  Administered 2019-11-02: 40 meq via ORAL
  Filled 2019-11-01: qty 2

## 2019-11-01 MED ORDER — HYDROCHLOROTHIAZIDE 12.5 MG PO CAPS
12.5000 mg | ORAL_CAPSULE | Freq: Every day | ORAL | Status: DC
  Administered 2019-11-02: 09:00:00 12.5 mg via ORAL
  Filled 2019-11-01: qty 1

## 2019-11-01 MED ORDER — MAGNESIUM HYDROXIDE 400 MG/5ML PO SUSP: 30.0000 mL | Freq: Every day | ORAL | Status: DC | PRN

## 2019-11-01 MED ORDER — ALBUTEROL SULFATE HFA 108 (90 BASE) MCG/ACT IN AERS: 2.0000 | INHALATION_SPRAY | Freq: Four times a day (QID) | RESPIRATORY_TRACT | Status: DC | PRN

## 2019-11-01 MED ORDER — ACETAMINOPHEN 325 MG PO TABS: 650.0000 mg | ORAL_TABLET | Freq: Four times a day (QID) | ORAL | Status: DC | PRN

## 2019-11-01 MED ORDER — METOPROLOL SUCCINATE ER 25 MG PO TB24
25.0000 mg | ORAL_TABLET | Freq: Every day | ORAL | Status: DC
  Filled 2019-11-01: qty 1

## 2019-11-01 MED ORDER — FLUTICASONE FUROATE-VILANTEROL 200-25 MCG/INH IN AEPB
1.0000 | INHALATION_SPRAY | Freq: Every day | RESPIRATORY_TRACT | Status: DC
  Filled 2019-11-01: qty 28

## 2019-11-01 MED ORDER — RISPERIDONE 3 MG PO TABS: 3.0000 mg | ORAL_TABLET | Freq: Two times a day (BID) | ORAL | Status: DC

## 2019-11-01 NOTE — Plan of Care (Signed)
Patient seen to discuss his case he is reporting auditory hallucinations that have never fully resolved with olanzapine believes he needs a medication adjustment.  He is alert and oriented and generally cooperative denies current but reports recent auditory hallucinations reported recent visual hallucinations of a bizarre nature that imply a lack of genuine symptomatology (seeing dinosaurs) at any rate denies wanting to harm others but reports voices telling him to harm himself  Will be monitored overnight for his schizophrenic condition complicated by cocaine abuse We will begin perphenazine given the hypotension he has as other agents may be more prone to drop his pressure further

## 2019-11-01 NOTE — BH Assessment (Signed)
[facility] Assessment Progress Note  Per Berneice Heinrich, FNP, this pt would benefit from admission the [facility] Observation Unit at this time.  Jasmine has assigned pt to Obs Rm [id number].  Pt has signed Voluntary Admission and Consent for Treatment, as well as Consent to Release Information, and signed forms have been faxed to [facility].  Pt's nurse, Lyla Son, has been notified, and agrees to send original paperwork along with pt via [employer], and to call report to [phone].  Doylene Canning, [place] [facility] Coordinator [phone]

## 2019-11-01 NOTE — ED Notes (Signed)
Pt has two patient belonging bags at triage nurse's station

## 2019-11-02 DIAGNOSIS — F1410 Cocaine abuse, uncomplicated: Secondary | ICD-10-CM | POA: Diagnosis not present

## 2019-11-02 DIAGNOSIS — F250 Schizoaffective disorder, bipolar type: Secondary | ICD-10-CM

## 2019-11-02 MED ORDER — GABAPENTIN 300 MG PO CAPS: 300.0000 mg | ORAL_CAPSULE | Freq: Three times a day (TID) | ORAL | 3 refills | Status: AC

## 2019-11-02 MED ORDER — OLANZAPINE 15 MG PO TABS: 30.0000 mg | ORAL_TABLET | Freq: Every day | ORAL | 3 refills | Status: AC

## 2019-11-02 MED ORDER — BENZTROPINE MESYLATE 0.5 MG PO TABS: 0.5000 mg | ORAL_TABLET | Freq: Two times a day (BID) | ORAL | 2 refills | Status: AC

## 2019-11-02 MED ORDER — RIVAROXABAN 20 MG PO TABS: 20.0000 mg | ORAL_TABLET | Freq: Every day | ORAL | 2 refills | Status: AC

## 2019-11-02 MED ORDER — BREO ELLIPTA 200-25 MCG/INH IN AEPB: 1.0000 | INHALATION_SPRAY | Freq: Every day | RESPIRATORY_TRACT | 2 refills | Status: AC

## 2019-11-02 NOTE — Discharge Summary (Signed)
Physician Discharge Summary Note  Patient:  [name] is an 44 y.o., male MRN:  [id number] DOB:  1976/06/08 Patient phone:  [phone] (home)  Patient address:   [address] [place] [address],  Total Time spent with patient: 45 minutes  Date of Admission:  11/01/2019 Date of Discharge: 11/02/2019  Reason for Admission:   [name] is an 44 y.o. male presenting with SI with plan to cut himself or jump off a bridge. Patient reported onset of "all day today starting at 1pm". Patient reported trigger was watching his mothers death in 2018/02/08 from brain cancer. Patient reported hallucinations of seeing cartoons and dinosaurs. Patient reported he is taking medications but unable to name his psychiatrist, stating he has a "house doctor named Dr. Wadie Lessen" that is over the house where he rents a room. Patient reported receiving no other outpatient mental health services at this time. Patient admitted to cocaine usage and that he relapsed 1 month ago after being clean for 1 year. Patient reported history of 1x suicide attempt. Patient reported inpatient mental health 04/2019 and 06/2019. Patient denied HI. Patient reported 8 hrs sleep and poor appetite. Patient was calm and cooperative during assessment. Patient requesting inpatient treatment.   Principal Problem: Reported exacerbation of psychotic disorder complicated by cocaine abuse Discharge Diagnoses: Active Problems:   Schizoaffective disorder (HCC)   MDD (major depressive disorder)   Past Psychiatric History: See eval  Past Medical History:  Past Medical History:  Diagnosis Date  . Chronic back pain   . Cocaine use disorder (HCC)   . COPD (chronic obstructive pulmonary disease) (HCC)   . History of suicidal ideation   . Hypertension   . PE (pulmonary thromboembolism) (HCC) 10/2018   recurrent  . Schizophrenic disorder (HCC)    History reviewed. No pertinent surgical history. Family History:  Family History  Problem Relation  Age of Onset  . Cancer Mother   . Heart disease Father   . Drug abuse Brother    Family Psychiatric  History: See eval Social History:  Social History   Substance and Sexual Activity  Alcohol Use No   Comment: seldom     Social History   Substance and Sexual Activity  Drug Use Yes  . Types: "Crack" cocaine, Cocaine    Social History   Socioeconomic History  . Marital status: Single    Spouse name: Not on file  . Number of children: Not on file  . Years of education: Not on file  . Highest education level: Not on file  Occupational History  . Not on file  Tobacco Use  . Smoking status: Current Every Day Smoker    Packs/day: 2.50    Years: 15.00    Pack years: 37.50    Types: Cigarettes  . Smokeless tobacco: Never Used  Substance and Sexual Activity  . Alcohol use: No    Comment: seldom  . Drug use: Yes    Types: "Crack" cocaine, Cocaine  . Sexual activity: Never  Other Topics Concern  . Not on file  Social History Narrative   ** Merged History Encounter **       Social Determinants of Health   Financial Resource Strain:   . Difficulty of Paying Living Expenses: Not on file  Food Insecurity:   . Worried About Programme researcher, broadcasting/film/video in the Last Year: Not on file  . Ran Out of Food in the Last Year: Not on file  Transportation Needs:   .  Lack of Transportation (Medical): Not on file  . Lack of Transportation (Non-Medical): Not on file  Physical Activity:   . Days of Exercise per Week: Not on file  . Minutes of Exercise per Session: Not on file  Stress:   . Feeling of Stress : Not on file  Social Connections:   . Frequency of Communication with Friends and Family: Not on file  . Frequency of Social Gatherings with Friends and Family: Not on file  . Attends Religious Services: Not on file  . Active Member of Clubs or Organizations: Not on file  . Attends Archivist Meetings: Not on file  . Marital Status: Not on file    Hospital Course:     Patient was admitted to the observation unit due to complaints of auditory and visual hallucinations though bizarre and probably not accurate he was given the benefit of the doubt.  He was monitored overnight displayed no danger behaviors.  Zyprexa was reinstituted augmented with low-dose perphenazine.  By the morning of the fifth he denied current auditory or visual hallucinations but further elaborated he was "no better".  So he was paradoxically saying he had not improve while he was endorsing no hallucinations.  At any rate he did not have suicidal thoughts homicidal thoughts had no cravings for cocaine.  Was requesting discharge to stay on medications as an outpatient and displayed no acute dangerousness here and had no suicidal thoughts.  He requested specifically to have refills of his Zyprexa, which was escalated to 30 mg at bedtime, and gabapentin and his Xarelto.  Physical Findings: AIMS: Facial and Oral Movements Muscles of Facial Expression: None, normal Lips and Perioral Area: None, normal Jaw: None, normal Tongue: None, normal,Extremity Movements Upper (arms, wrists, hands, fingers): None, normal Lower (legs, knees, ankles, toes): None, normal, Trunk Movements Neck, shoulders, hips: None, normal, Overall Severity Severity of abnormal movements (highest score from questions above): None, normal Incapacitation due to abnormal movements: None, normal Patient's awareness of abnormal movements (rate only patient's report): No Awareness, Dental Status Current problems with teeth and/or dentures?: Yes Does patient usually wear dentures?: Yes  CIWA:    COWS:     Musculoskeletal: Strength & Muscle Tone: within normal limits Gait & Station: normal Patient leans: N/A  Psychiatric Specialty Exam: Physical Exam  Review of Systems  Blood pressure 102/80, pulse 88, temperature 98.6 F (37 C), temperature source Oral, resp. rate 20, SpO2 97 %.There is no height or weight on file to  calculate BMI.  General Appearance: Disheveled  Eye Contact:  Minimal  Speech:  Clear and Coherent  Volume:  Decreased  Mood:  Euthymic  Affect:  Constricted  Thought Process:  Linear and Descriptions of Associations: Circumstantial  Orientation:  Full (Time, Place, and Person)  Thought Content:  Denies previous expressed psychotic symptoms  Suicidal Thoughts:  No  Homicidal Thoughts:  No  Memory:  Recent;   Fair Remote;   Fair  Judgement:  Fair  Insight:  Fair  Psychomotor Activity:  Normal  Concentration:  Concentration: Fair and Attention Span: Fair  Recall:  [employer] of Knowledge:  Fair  Language:  Fair  Akathisia:  Negative  Handed:  Right  AIMS (if indicated):     Assets:  Resilience Social Support  ADL's:  Intact  Cognition:  WNL  Sleep:           Has this patient used any form of tobacco in the last 30 days? (Cigarettes, Smokeless Tobacco, Cigars,  and/or Pipes) Yes, No  Blood Alcohol level:  Lab Results  Component Value Date   ETH <10 10/31/2019   ETH <10 05/05/2019    Metabolic Disorder Labs:  Lab Results  Component Value Date   HGBA1C 4.8 12/13/2017   MPG 91.06 12/13/2017   MPG 103 10/25/2009   No results found for: PROLACTIN Lab Results  Component Value Date   CHOL 198 12/13/2017   TRIG 132 12/13/2017   HDL 37 (L) 12/13/2017   CHOLHDL 5.4 12/13/2017   VLDL 26 12/13/2017   LDLCALC 135 (H) 12/13/2017   LDLCALC  10/25/2009    UNABLE TO CALCULATE IF TRIGLYCERIDE OVER 400 mg/dL        Total Cholesterol/HDL:CHD Risk Coronary Heart Disease Risk Table                     Men   Women  1/2 Average Risk   3.4   3.3  Average Risk       5.0   4.4  2 X Average Risk   9.6   7.1  3 X Average Risk  23.4   11.0        Use the calculated Patient Ratio above and the CHD Risk Table to determine the patient's CHD Risk.        ATP III CLASSIFICATION (LDL):  <100     mg/dL   Optimal  694-854  mg/dL   Near or Above                    Optimal  130-159   mg/dL   Borderline  627-035  mg/dL   High  >009     mg/dL   Very High    See Psychiatric Specialty Exam and Suicide Risk Assessment completed by Attending Physician prior to discharge.  Discharge destination:  Home  Is patient on multiple antipsychotic therapies at discharge:  No   Has Patient had three or more failed trials of antipsychotic monotherapy by history:  No  Recommended Plan for Multiple Antipsychotic Therapies: NA   Allergies as of 11/02/2019      Reactions   Haldol [haloperidol Decanoate] Other (See Comments)   'I curl up"      Medication List    STOP taking these medications   acetaminophen 500 MG tablet Commonly known as: TYLENOL   hydrOXYzine 25 MG tablet Commonly known as: ATARAX/VISTARIL     TAKE these medications     Indication  albuterol 108 (90 Base) MCG/ACT inhaler Commonly known as: VENTOLIN HFA Inhale 2 puffs into the lungs every 6 (six) hours as needed for wheezing or shortness of breath.  Indication: Asthma   benztropine 0.5 MG tablet Commonly known as: COGENTIN Take 1 tablet (0.5 mg total) by mouth 2 (two) times daily.  Indication: Extrapyramidal Reaction caused by Medications   Breo Ellipta 200-25 MCG/INH Aepb Generic drug: fluticasone furoate-vilanterol Inhale 1 puff into the lungs daily.  Indication: Asthma   FLUoxetine 20 MG capsule Commonly known as: PROZAC Take 1 capsule (20 mg total) by mouth daily.  Indication: Depression   gabapentin 300 MG capsule Commonly known as: NEURONTIN Take 1 capsule (300 mg total) by mouth 3 (three) times daily. What changed:   medication strength  how much to take  Indication: Neuropathic Pain   hydrochlorothiazide 12.5 MG capsule Commonly known as: MICROZIDE Take 12.5 mg by mouth daily.  Indication: High Blood Pressure Disorder   Invega Sustenna 156 MG/ML Susy injection Generic  drug: paliperidone Inject 156 mg into the muscle every 30 (thirty) days. On the 4th of each month   Indication: Schizoaffective Disorder   metoprolol succinate 25 MG 24 hr tablet Commonly known as: TOPROL-XL Take 25 mg by mouth daily.  Indication: High Blood Pressure Disorder   OLANZapine 15 MG tablet Commonly known as: ZyPREXA Take 2 tablets (30 mg total) by mouth at bedtime. What changed:   medication strength  how much to take  Indication: Schizophrenia   rivaroxaban 20 MG Tabs tablet Commonly known as: XARELTO Take 1 tablet (20 mg total) by mouth daily with supper.  Indication: Venous Thromboembolism   sodium chloride 0.65 % Soln nasal spray Commonly known as: OCEAN Place 1 spray into both nostrils 2 (two) times daily as needed for congestion.  Indication: Stuffy Nose        Signed: Malvin Johns, MD 11/02/2019, 7:31 AM

## 2019-11-02 NOTE — H&P (Signed)
Psychiatric Admission Assessment Adult  Patient Identification: [name] MRN:  [id number] Date of Evaluation:  11/02/2019 Chief Complaint:  MDD (major depressive disorder) [F32.9] Principal Diagnosis: <principal problem not specified> Diagnosis:  Active Problems:   Schizoaffective disorder (HCC)   MDD (major depressive disorder)  History of Present Illness:  [name] is an 44 y.o. male presenting with SI with plan to cut himself or jump off a bridge. Patient reported onset of "all day today starting at 1pm". Patient reported trigger was watching his mothers death in 2018-02-05 from brain cancer. Patient reported hallucinations of seeing cartoons and dinosaurs. Patient reported he is taking medications but unable to name his psychiatrist, stating he has a "house doctor named [name]" that is over the house where he rents a room. Patient reported receiving no other outpatient mental health services at this time. Patient admitted to cocaine usage and that he relapsed 1 month ago after being clean for 1 year. Patient reported history of 1x suicide attempt. Patient reported inpatient mental health 04/2019 and 06/2019. Patient denied HI. Patient reported 8 hrs sleep and poor appetite. Patient was calm and cooperative during assessment. Patient requesting inpatient treatment.  Patient elaborates to me that he wants to get off cocaine and take a day or so to have that cleared of his system and get back on antipsychotics Associated Signs/Symptoms: Depression Symptoms:  insomnia, (Hypo) Manic Symptoms:  Not evident Anxiety Symptoms:  Denies Psychotic Symptoms:  Reports auditory hallucinations visual hallucinations will not elaborate content to me PTSD Symptoms: NA Total Time spent with patient: 45 minutes  Past Psychiatric History: See eval  Is the patient at risk to self? No.  Has the patient been a risk to self in the past 6 months? No.  Has the patient been a risk to self within the distant  past? No.  Is the patient a risk to others? No.  Has the patient been a risk to others in the past 6 months? No.  Has the patient been a risk to others within the distant past? No.   Prior Inpatient Therapy:   Prior Outpatient Therapy:    Alcohol Screening: 1. How often do you have a drink containing alcohol?: Never 2. How many drinks containing alcohol do you have on a typical day when you are drinking?: 1 or 2 3. How often do you have six or more drinks on one occasion?: Never AUDIT-C Score: 0 4. How often during the last year have you found that you were not able to stop drinking once you had started?: Never 5. How often during the last year have you failed to do what was normally expected from you becasue of drinking?: Never 6. How often during the last year have you needed a first drink in the morning to get yourself going after a heavy drinking session?: Never 7. How often during the last year have you had a feeling of guilt of remorse after drinking?: Never 8. How often during the last year have you been unable to remember what happened the night before because you had been drinking?: Never 9. Have you or someone else been injured as a result of your drinking?: No 10. Has a relative or friend or a doctor or another health worker been concerned about your drinking or suggested you cut down?: No Alcohol Use Disorder Identification Test Final Score (AUDIT): 0 Alcohol Brief Interventions/Follow-up: AUDIT Score <7 follow-up not indicated Substance Abuse History in the last 12 months:  Yes.  Consequences of Substance Abuse: NA Previous Psychotropic Medications: Yes  Psychological Evaluations: No  Past Medical History:  Past Medical History:  Diagnosis Date  . Chronic back pain   . Cocaine use disorder (HCC)   . COPD (chronic obstructive pulmonary disease) (HCC)   . History of suicidal ideation   . Hypertension   . PE (pulmonary thromboembolism) (HCC) 10/2018   recurrent  .  Schizophrenic disorder (HCC)    History reviewed. No pertinent surgical history. Family History:  Family History  Problem Relation Age of Onset  . Cancer Mother   . Heart disease Father   . Drug abuse Brother    Family Psychiatric  History: Negative Tobacco Screening:   Social History:  Social History   Substance and Sexual Activity  Alcohol Use No   Comment: seldom     Social History   Substance and Sexual Activity  Drug Use Yes  . Types: "Crack" cocaine, Cocaine    Additional Social History:    Specify valuables returned: [employer] #50/51                      Allergies:   Allergies  Allergen Reactions  . Haldol [Haloperidol Decanoate] Other (See Comments)    'I curl up"   Lab Results:  Results for orders placed or performed during the hospital encounter of 10/31/19 (from the past 48 hour(s))  Comprehensive metabolic panel     Status: Abnormal   Collection Time: 10/31/19  3:36 PM  Result Value Ref Range   Sodium 139 135 - 145 mmol/L   Potassium 3.2 (L) 3.5 - 5.1 mmol/L   Chloride 99 98 - 111 mmol/L   CO2 30 22 - 32 mmol/L   Glucose, Bld 124 (H) 70 - 99 mg/dL   BUN 7 6 - 20 mg/dL   Creatinine, Ser 7.20 0.61 - 1.24 mg/dL   Calcium 9.2 8.9 - 94.7 mg/dL   Total Protein 7.3 6.5 - 8.1 g/dL   Albumin 4.2 3.5 - 5.0 g/dL   AST 19 15 - 41 U/L   ALT 17 0 - 44 U/L   Alkaline Phosphatase 102 38 - 126 U/L   Total Bilirubin 1.0 0.3 - 1.2 mg/dL   GFR calc non Af Amer >60 >60 mL/min   GFR calc Af Amer >60 >60 mL/min   Anion gap 10 5 - 15    Comment: Performed at [facility], [address] [place], [place] [address]  cbc     Status: Abnormal   Collection Time: 10/31/19  3:36 PM  Result Value Ref Range   WBC 10.9 (H) 4.0 - 10.5 K/uL   RBC 4.81 4.22 - 5.81 MIL/uL   Hemoglobin 15.1 13.0 - 17.0 g/dL   HCT 36.6 29.4 - 76.5 %   MCV 94.4 80.0 - 100.0 fL   MCH 31.4 26.0 - 34.0 pg   MCHC 33.3 30.0 - 36.0 g/dL   RDW 46.5 03.5 - 46.5 %   Platelets  207 150 - 400 K/uL   nRBC 0.0 0.0 - 0.2 %    Comment: Performed at [facility], [address] [place], [place] [address]  Respiratory Panel by RT PCR (Flu A&B, Covid) - Nasopharyngeal Swab     Status: None   Collection Time: 10/31/19  6:21 PM   Specimen: Nasopharyngeal Swab  Result Value Ref Range   SARS Coronavirus 2 by RT PCR NEGATIVE NEGATIVE    Comment: (NOTE) SARS-CoV-2 target nucleic  acids are NOT DETECTED. The SARS-CoV-2 RNA is generally detectable in upper respiratoy specimens during the acute phase of infection. The lowest concentration of SARS-CoV-2 viral copies this assay can detect is 131 copies/mL. A negative result does not preclude SARS-Cov-2 infection and should not be used as the sole basis for treatment or other patient management decisions. A negative result may occur with  improper specimen collection/handling, submission of specimen other than nasopharyngeal swab, presence of viral mutation(s) within the areas targeted by this assay, and inadequate number of viral copies (<131 copies/mL). A negative result must be combined with clinical observations, patient history, and epidemiological information. The expected result is Negative. Fact Sheet for Patients:  PinkCheek.be Fact Sheet for Healthcare Providers:  GravelBags.it This test is not yet ap proved or cleared by the [place] FDA and  has been authorized for detection and/or diagnosis of SARS-CoV-2 by FDA under an Emergency Use Authorization (EUA). This EUA will remain  in effect (meaning this test can be used) for the duration of the COVID-19 declaration under Section 564(b)(1) of the Act, 21 U.S.C. section 360bbb-3(b)(1), unless the authorization is terminated or revoked sooner.    Influenza A by PCR NEGATIVE NEGATIVE   Influenza B by PCR NEGATIVE NEGATIVE    Comment: (NOTE) The Xpert Xpress SARS-CoV-2/FLU/RSV assay is  intended as an aid in  the diagnosis of influenza from Nasopharyngeal swab specimens and  should not be used as a sole basis for treatment. Nasal washings and  aspirates are unacceptable for Xpert Xpress SARS-CoV-2/FLU/RSV  testing. Fact Sheet for Patients: PinkCheek.be Fact Sheet for Healthcare Providers: GravelBags.it This test is not yet approved or cleared by the [place] FDA and  has been authorized for detection and/or diagnosis of SARS-CoV-2 by  FDA under an Emergency Use Authorization (EUA). This EUA will remain  in effect (meaning this test can be used) for the duration of the  Covid-19 declaration under Section 564(b)(1) of the Act, 21  U.S.C. section 360bbb-3(b)(1), unless the authorization is  terminated or revoked. Performed at [facility], [place] [address] [place], [place] [address]     Blood Alcohol level:  Lab Results  Component Value Date   ETH <10 10/31/2019   ETH <10 77/82/4235    Metabolic Disorder Labs:  Lab Results  Component Value Date   HGBA1C 4.8 12/13/2017   MPG 91.06 12/13/2017   MPG 103 10/25/2009   No results found for: PROLACTIN Lab Results  Component Value Date   CHOL 198 12/13/2017   TRIG 132 12/13/2017   HDL 37 (L) 12/13/2017   CHOLHDL 5.4 12/13/2017   VLDL 26 12/13/2017   LDLCALC 135 (H) 12/13/2017   LDLCALC  10/25/2009    UNABLE TO CALCULATE IF TRIGLYCERIDE OVER 400 mg/dL        Total Cholesterol/HDL:CHD Risk Coronary Heart Disease Risk Table                     Men   Women  1/2 Average Risk   3.4   3.3  Average Risk       5.0   4.4  2 X Average Risk   9.6   7.1  3 X Average Risk  23.4   11.0        Use the calculated Patient Ratio above and the CHD Risk Table to determine the patient's CHD Risk.        ATP III CLASSIFICATION (LDL):  <100     mg/dL  Optimal  100-129  mg/dL   Near or Above                    Optimal  130-159  mg/dL    Borderline  009-233  mg/dL   High  >007     mg/dL   Very High    Current Medications: Current Facility-Administered Medications  Medication Dose Route Frequency Provider Last Rate Last Admin  . acetaminophen (TYLENOL) tablet 650 mg  650 mg Oral Q6H PRN Patrcia Dolly, FNP      . albuterol (VENTOLIN HFA) 108 (90 Base) MCG/ACT inhaler 2 puff  2 puff Inhalation Q6H PRN Patrcia Dolly, FNP      . alum & mag hydroxide-simeth (MAALOX/MYLANTA) 200-200-20 MG/5ML suspension 30 mL  30 mL Oral Q4H PRN Patrcia Dolly, FNP      . benztropine (COGENTIN) tablet 0.5 mg  0.5 mg Oral BID Malvin Johns, MD   0.5 mg at 11/02/19 0904  . FLUoxetine (PROZAC) capsule 20 mg  20 mg Oral Daily Patrcia Dolly, FNP   20 mg at 11/02/19 0904  . fluticasone furoate-vilanterol (BREO ELLIPTA) 200-25 MCG/INH 1 puff  1 puff Inhalation Daily Berneice Heinrich L, FNP      . gabapentin (NEURONTIN) capsule 300 mg  300 mg Oral TID Malvin Johns, MD   300 mg at 11/02/19 0904  . hydrochlorothiazide (MICROZIDE) capsule 12.5 mg  12.5 mg Oral Daily Patrcia Dolly, FNP   12.5 mg at 11/02/19 0905  . magnesium hydroxide (MILK OF MAGNESIA) suspension 30 mL  30 mL Oral Daily PRN Patrcia Dolly, FNP      . metoprolol succinate (TOPROL-XL) 24 hr tablet 25 mg  25 mg Oral Daily Patrcia Dolly, FNP      . OLANZapine (ZYPREXA) tablet 20 mg  20 mg Oral QHS Patrcia Dolly, FNP   20 mg at 11/01/19 2138  . potassium chloride SA (KLOR-CON) CR tablet 40 mEq  40 mEq Oral Daily Patrcia Dolly, FNP   40 mEq at 11/02/19 0905  . rivaroxaban (XARELTO) tablet 20 mg  20 mg Oral Q supper Patrcia Dolly, FNP   20 mg at 11/01/19 2034  . temazepam (RESTORIL) capsule 30 mg  30 mg Oral QHS Malvin Johns, MD   30 mg at 11/01/19 2138   Current Outpatient Medications  Medication Sig Dispense Refill  . albuterol (PROVENTIL HFA;VENTOLIN HFA) 108 (90 Base) MCG/ACT inhaler Inhale 2 puffs into the lungs every 6 (six) hours as needed for wheezing or shortness of breath. 1 Inhaler 2  . benztropine  (COGENTIN) 0.5 MG tablet Take 1 tablet (0.5 mg total) by mouth 2 (two) times daily. 60 tablet 2  . FLUoxetine (PROZAC) 20 MG capsule Take 1 capsule (20 mg total) by mouth daily. (Patient not taking: Reported on 10/31/2019) 30 capsule 0  . fluticasone furoate-vilanterol (BREO ELLIPTA) 200-25 MCG/INH AEPB Inhale 1 puff into the lungs daily. 1 each 2  . gabapentin (NEURONTIN) 300 MG capsule Take 1 capsule (300 mg total) by mouth 3 (three) times daily. 90 capsule 3  . hydrochlorothiazide (MICROZIDE) 12.5 MG capsule Take 12.5 mg by mouth daily.    . metoprolol succinate (TOPROL-XL) 25 MG 24 hr tablet Take 25 mg by mouth daily.    Marland Kitchen OLANZapine (ZYPREXA) 15 MG tablet Take 2 tablets (30 mg total) by mouth at bedtime. 60 tablet 3  . paliperidone (INVEGA SUSTENNA) 156 MG/ML SUSY injection Inject 156 mg into the  muscle every 30 (thirty) days. On the 4th of each month    . rivaroxaban (XARELTO) 20 MG TABS tablet Take 1 tablet (20 mg total) by mouth daily with supper. 30 tablet 2  . sodium chloride (OCEAN) 0.65 % SOLN nasal spray Place 1 spray into both nostrils 2 (two) times daily as needed for congestion.     PTA Medications: No medications prior to admission.    Musculoskeletal: Strength & Muscle Tone: within normal limits Gait & Station: normal Patient leans: N/A  Psychiatric Specialty Exam: Physical Exam  Review of Systems  Blood pressure 101/69, pulse 69, temperature 98 F (36.7 C), temperature source Oral, resp. rate 20, SpO2 97 %.There is no height or weight on file to calculate BMI.  General Appearance: Disheveled  Eye Contact:  Good  Speech:  Garbled  Volume:  Decreased  Mood:  Dysphoric  Affect:  Appropriate and Congruent  Thought Process:  Coherent and Goal Directed  Orientation:  Full (Time, Place, and Person)  Thought Content:  Hallucinations: Auditory  Suicidal Thoughts:  No  Homicidal Thoughts:  No  Memory:  Immediate;   Fair Recent;   Good Remote;   Good  Judgement:  Good   Insight:  Fair  Psychomotor Activity:  Normal  Concentration:  Concentration: Fair and Attention Span: Poor  Recall:  [employer] of Knowledge:  Fair  Language:  Fair  Akathisia:  Negative  Handed:  Right  AIMS (if indicated):     Assets:  Social Support  ADL's:  Intact  Cognition:  WNL  Sleep:       Treatment Plan Summary: Daily contact with patient to assess and evaluate symptoms and progress in treatment and Medication management  Observation Level/Precautions:  15 minute checks  Laboratory:  UDS  Psychotherapy:    Medications:    Consultations:    Discharge Concerns:    Estimated LOS:  Other:     Physician Treatment Plan for Primary Diagnosis: Begin antipsychotics for complaints of psychotic symptoms monitor overnight in observation Long Term Goal(s): Improvement in symptoms so as ready for discharge  Short Term Goals: Ability to identify and develop effective coping behaviors will improve and Ability to maintain clinical measurements within normal limits will improve  Physician Treatment Plan for Secondary Diagnosis: Active Problems:   Schizoaffective disorder (HCC)   MDD (major depressive disorder)  Long Term Goal(s): Improvement in symptoms so as ready for discharge  Short Term Goals: Compliance with prescribed medications will improve and Ability to identify triggers associated with substance abuse/mental health issues will improve  I certify that inpatient services furnished can reasonably be expected to improve the patient's condition.     Malvin Johns, MD 2/5/20213:20 PM

## 2019-11-02 NOTE — Progress Notes (Signed)
Pt discharged to lobby. Pt was stable and appreciative at that time. All papers and prescriptions were given and valuables returned. Verbal understanding expressed. Denies SI/HI and A/VH. Pt given opportunity to express concerns and ask questions.  

## 2019-11-02 NOTE — Discharge Instructions (Signed)
For your mental health needs, you are advised to follow up with [facility].  Call them at your earliest opportunity to schedule an intake appointment:       [facility]      [address]      [place], [place] [address]      [phone]      Crisis number: [phone]  

## 2019-11-02 NOTE — [facility] Assessment (Signed)
[facility] Assessment Progress Note  Per Malvin Johns, MD, this pt does not require psychiatric hospitalization at this time.  Pt is to be discharged from the [facility] Observation Unit with recommendation to follow up with [facility].  This has been included in pt's discharge instructions.  Pt's nurse, Lanora Manis, has been notified.  Doylene Canning, MA Triage Specialist [phone]

## 2019-11-21 ENCOUNTER — Other Ambulatory Visit: Payer: Self-pay | Admitting: Pulmonary Disease

## 2019-11-21 DIAGNOSIS — I2699 Other pulmonary embolism without acute cor pulmonale: Secondary | ICD-10-CM

## 2020-01-09 DIAGNOSIS — R442 Other hallucinations: Secondary | ICD-10-CM | POA: Diagnosis not present

## 2020-01-09 DIAGNOSIS — R45851 Suicidal ideations: Secondary | ICD-10-CM | POA: Diagnosis not present

## 2020-01-09 DIAGNOSIS — R457 State of emotional shock and stress, unspecified: Secondary | ICD-10-CM | POA: Diagnosis not present

## 2020-01-11 DIAGNOSIS — F259 Schizoaffective disorder, unspecified: Secondary | ICD-10-CM | POA: Diagnosis not present

## 2020-01-12 DIAGNOSIS — F259 Schizoaffective disorder, unspecified: Secondary | ICD-10-CM | POA: Diagnosis not present

## 2020-01-12 DIAGNOSIS — I10 Essential (primary) hypertension: Secondary | ICD-10-CM | POA: Diagnosis not present

## 2020-01-13 DIAGNOSIS — F259 Schizoaffective disorder, unspecified: Secondary | ICD-10-CM | POA: Diagnosis not present

## 2020-01-14 DIAGNOSIS — F259 Schizoaffective disorder, unspecified: Secondary | ICD-10-CM | POA: Diagnosis not present

## 2020-01-15 DIAGNOSIS — F259 Schizoaffective disorder, unspecified: Secondary | ICD-10-CM | POA: Diagnosis not present

## 2020-01-16 DIAGNOSIS — F259 Schizoaffective disorder, unspecified: Secondary | ICD-10-CM | POA: Diagnosis not present

## 2020-01-17 DIAGNOSIS — F259 Schizoaffective disorder, unspecified: Secondary | ICD-10-CM | POA: Diagnosis not present

## 2020-01-18 DIAGNOSIS — Z86711 Personal history of pulmonary embolism: Secondary | ICD-10-CM | POA: Diagnosis not present

## 2020-01-18 DIAGNOSIS — F329 Major depressive disorder, single episode, unspecified: Secondary | ICD-10-CM | POA: Diagnosis not present

## 2020-01-18 DIAGNOSIS — I10 Essential (primary) hypertension: Secondary | ICD-10-CM | POA: Diagnosis not present

## 2020-01-18 DIAGNOSIS — F339 Major depressive disorder, recurrent, unspecified: Secondary | ICD-10-CM | POA: Diagnosis not present

## 2020-01-18 DIAGNOSIS — Z86718 Personal history of other venous thrombosis and embolism: Secondary | ICD-10-CM | POA: Diagnosis not present

## 2020-01-18 DIAGNOSIS — J449 Chronic obstructive pulmonary disease, unspecified: Secondary | ICD-10-CM | POA: Diagnosis not present

## 2020-01-18 DIAGNOSIS — F319 Bipolar disorder, unspecified: Secondary | ICD-10-CM | POA: Diagnosis not present

## 2020-01-18 DIAGNOSIS — F209 Schizophrenia, unspecified: Secondary | ICD-10-CM | POA: Diagnosis not present

## 2020-01-19 DIAGNOSIS — F319 Bipolar disorder, unspecified: Secondary | ICD-10-CM | POA: Diagnosis not present

## 2020-01-19 DIAGNOSIS — F339 Major depressive disorder, recurrent, unspecified: Secondary | ICD-10-CM | POA: Diagnosis not present

## 2020-01-20 DIAGNOSIS — F339 Major depressive disorder, recurrent, unspecified: Secondary | ICD-10-CM | POA: Diagnosis not present

## 2020-01-20 DIAGNOSIS — F319 Bipolar disorder, unspecified: Secondary | ICD-10-CM | POA: Diagnosis not present

## 2020-01-21 DIAGNOSIS — F319 Bipolar disorder, unspecified: Secondary | ICD-10-CM | POA: Diagnosis not present

## 2020-01-21 DIAGNOSIS — F339 Major depressive disorder, recurrent, unspecified: Secondary | ICD-10-CM | POA: Diagnosis not present

## 2020-01-22 DIAGNOSIS — F319 Bipolar disorder, unspecified: Secondary | ICD-10-CM | POA: Diagnosis not present

## 2020-01-22 DIAGNOSIS — F339 Major depressive disorder, recurrent, unspecified: Secondary | ICD-10-CM | POA: Diagnosis not present

## 2020-01-23 DIAGNOSIS — F319 Bipolar disorder, unspecified: Secondary | ICD-10-CM | POA: Diagnosis not present

## 2020-01-23 DIAGNOSIS — F339 Major depressive disorder, recurrent, unspecified: Secondary | ICD-10-CM | POA: Diagnosis not present

## 2020-01-24 DIAGNOSIS — F319 Bipolar disorder, unspecified: Secondary | ICD-10-CM | POA: Diagnosis not present

## 2020-01-24 DIAGNOSIS — F339 Major depressive disorder, recurrent, unspecified: Secondary | ICD-10-CM | POA: Diagnosis not present

## 2020-01-25 DIAGNOSIS — F319 Bipolar disorder, unspecified: Secondary | ICD-10-CM | POA: Diagnosis not present

## 2020-01-25 DIAGNOSIS — F339 Major depressive disorder, recurrent, unspecified: Secondary | ICD-10-CM | POA: Diagnosis not present

## 2020-01-26 DIAGNOSIS — F339 Major depressive disorder, recurrent, unspecified: Secondary | ICD-10-CM | POA: Diagnosis not present

## 2020-01-26 DIAGNOSIS — F319 Bipolar disorder, unspecified: Secondary | ICD-10-CM | POA: Diagnosis not present

## 2020-01-27 DIAGNOSIS — F339 Major depressive disorder, recurrent, unspecified: Secondary | ICD-10-CM | POA: Diagnosis not present

## 2020-01-27 DIAGNOSIS — F319 Bipolar disorder, unspecified: Secondary | ICD-10-CM | POA: Diagnosis not present

## 2020-01-28 DIAGNOSIS — F319 Bipolar disorder, unspecified: Secondary | ICD-10-CM | POA: Diagnosis not present

## 2020-01-28 DIAGNOSIS — F339 Major depressive disorder, recurrent, unspecified: Secondary | ICD-10-CM | POA: Diagnosis not present

## 2020-01-31 DIAGNOSIS — R509 Fever, unspecified: Secondary | ICD-10-CM | POA: Diagnosis not present

## 2020-05-05 DIAGNOSIS — L03311 Cellulitis of abdominal wall: Secondary | ICD-10-CM | POA: Diagnosis not present

## 2020-05-05 DIAGNOSIS — S31110A Laceration without foreign body of abdominal wall, right upper quadrant without penetration into peritoneal cavity, initial encounter: Secondary | ICD-10-CM | POA: Diagnosis not present

## 2020-05-05 DIAGNOSIS — X58XXXA Exposure to other specified factors, initial encounter: Secondary | ICD-10-CM | POA: Diagnosis not present

## 2020-05-05 DIAGNOSIS — F17210 Nicotine dependence, cigarettes, uncomplicated: Secondary | ICD-10-CM | POA: Diagnosis not present

## 2020-05-05 DIAGNOSIS — Y9289 Other specified places as the place of occurrence of the external cause: Secondary | ICD-10-CM | POA: Diagnosis not present

## 2020-05-05 DIAGNOSIS — S31109A Unspecified open wound of abdominal wall, unspecified quadrant without penetration into peritoneal cavity, initial encounter: Secondary | ICD-10-CM | POA: Diagnosis not present

## 2020-05-20 DIAGNOSIS — Z0289 Encounter for other administrative examinations: Secondary | ICD-10-CM | POA: Diagnosis not present

## 2020-05-20 DIAGNOSIS — F17210 Nicotine dependence, cigarettes, uncomplicated: Secondary | ICD-10-CM | POA: Diagnosis not present

## 2020-05-20 DIAGNOSIS — Z7689 Persons encountering health services in other specified circumstances: Secondary | ICD-10-CM | POA: Diagnosis not present

## 2020-07-21 DIAGNOSIS — F209 Schizophrenia, unspecified: Secondary | ICD-10-CM | POA: Diagnosis not present

## 2020-07-28 DIAGNOSIS — F209 Schizophrenia, unspecified: Secondary | ICD-10-CM | POA: Diagnosis not present

## 2020-09-02 DIAGNOSIS — F209 Schizophrenia, unspecified: Secondary | ICD-10-CM | POA: Diagnosis not present
